# Patient Record
Sex: Female | Born: 1945 | Race: White | Hispanic: No | Smoking: Former smoker
Health system: Southern US, Community
[De-identification: ages and names within clinical notes are randomized; demographics above are authoritative.]

## PROBLEM LIST (undated history)

## (undated) DIAGNOSIS — T7840XA Allergy, unspecified, initial encounter: Secondary | ICD-10-CM

## (undated) DIAGNOSIS — F419 Anxiety disorder, unspecified: Secondary | ICD-10-CM

## (undated) DIAGNOSIS — E038 Other specified hypothyroidism: Secondary | ICD-10-CM

## (undated) DIAGNOSIS — F329 Major depressive disorder, single episode, unspecified: Secondary | ICD-10-CM

## (undated) DIAGNOSIS — G5600 Carpal tunnel syndrome, unspecified upper limb: Secondary | ICD-10-CM

## (undated) DIAGNOSIS — H269 Unspecified cataract: Secondary | ICD-10-CM

## (undated) DIAGNOSIS — M1990 Unspecified osteoarthritis, unspecified site: Secondary | ICD-10-CM

## (undated) DIAGNOSIS — M2040 Other hammer toe(s) (acquired), unspecified foot: Secondary | ICD-10-CM

## (undated) DIAGNOSIS — H8101 Meniere's disease, right ear: Secondary | ICD-10-CM

## (undated) DIAGNOSIS — E079 Disorder of thyroid, unspecified: Secondary | ICD-10-CM

## (undated) DIAGNOSIS — I499 Cardiac arrhythmia, unspecified: Secondary | ICD-10-CM

## (undated) DIAGNOSIS — E063 Autoimmune thyroiditis: Secondary | ICD-10-CM

## (undated) DIAGNOSIS — F32A Depression, unspecified: Secondary | ICD-10-CM

## (undated) HISTORY — DX: Allergy, unspecified, initial encounter: T78.40XA

## (undated) HISTORY — PX: COLONOSCOPY: SHX174

## (undated) HISTORY — PX: CYSTECTOMY: SUR359

## (undated) HISTORY — PX: APPENDECTOMY: SHX54

## (undated) HISTORY — PX: TONSILLECTOMY: SUR1361

## (undated) HISTORY — DX: Other specified hypothyroidism: E03.8

## (undated) HISTORY — DX: Unspecified osteoarthritis, unspecified site: M19.90

## (undated) HISTORY — DX: Autoimmune thyroiditis: E06.3

## (undated) HISTORY — DX: Anxiety disorder, unspecified: F41.9

## (undated) HISTORY — DX: Other hammer toe(s) (acquired), unspecified foot: M20.40

## (undated) HISTORY — DX: Depression, unspecified: F32.A

## (undated) HISTORY — DX: Disorder of thyroid, unspecified: E07.9

## (undated) HISTORY — DX: Unspecified cataract: H26.9

## (undated) HISTORY — DX: Carpal tunnel syndrome, unspecified upper limb: G56.00

## (undated) HISTORY — PX: OOPHORECTOMY: SHX86

## (undated) HISTORY — PX: CATARACT EXTRACTION: SUR2

## (undated) HISTORY — DX: Major depressive disorder, single episode, unspecified: F32.9

## (undated) HISTORY — DX: Meniere's disease, right ear: H81.01

---

## 1998-04-30 ENCOUNTER — Other Ambulatory Visit: Admission: RE | Admit: 1998-04-30 | Discharge: 1998-04-30 | Payer: Self-pay | Admitting: Obstetrics and Gynecology

## 1999-07-11 ENCOUNTER — Encounter: Payer: Self-pay | Admitting: Obstetrics and Gynecology

## 1999-07-11 ENCOUNTER — Ambulatory Visit (HOSPITAL_COMMUNITY): Admission: RE | Admit: 1999-07-11 | Discharge: 1999-07-11 | Payer: Self-pay | Admitting: Obstetrics and Gynecology

## 2000-10-09 ENCOUNTER — Ambulatory Visit (HOSPITAL_COMMUNITY): Admission: RE | Admit: 2000-10-09 | Discharge: 2000-10-09 | Payer: Self-pay | Admitting: *Deleted

## 2001-05-13 ENCOUNTER — Ambulatory Visit (HOSPITAL_COMMUNITY): Admission: RE | Admit: 2001-05-13 | Discharge: 2001-05-13 | Payer: Self-pay | Admitting: *Deleted

## 2002-11-07 ENCOUNTER — Encounter: Admission: RE | Admit: 2002-11-07 | Discharge: 2002-11-07 | Payer: Self-pay | Admitting: Psychiatry

## 2002-11-21 ENCOUNTER — Encounter: Admission: RE | Admit: 2002-11-21 | Discharge: 2002-11-21 | Payer: Self-pay | Admitting: Psychiatry

## 2002-12-06 ENCOUNTER — Encounter: Admission: RE | Admit: 2002-12-06 | Discharge: 2002-12-06 | Payer: Self-pay | Admitting: Psychiatry

## 2003-02-01 ENCOUNTER — Encounter: Admission: RE | Admit: 2003-02-01 | Discharge: 2003-02-01 | Payer: Self-pay | Admitting: Psychiatry

## 2003-02-13 ENCOUNTER — Encounter: Admission: RE | Admit: 2003-02-13 | Discharge: 2003-02-13 | Payer: Self-pay | Admitting: Psychiatry

## 2003-03-06 ENCOUNTER — Encounter: Admission: RE | Admit: 2003-03-06 | Discharge: 2003-03-06 | Payer: Self-pay | Admitting: Psychiatry

## 2003-03-20 ENCOUNTER — Encounter: Admission: RE | Admit: 2003-03-20 | Discharge: 2003-03-20 | Payer: Self-pay | Admitting: Psychiatry

## 2003-03-28 ENCOUNTER — Encounter: Admission: RE | Admit: 2003-03-28 | Discharge: 2003-03-28 | Payer: Self-pay | Admitting: Psychiatry

## 2004-04-05 ENCOUNTER — Encounter: Admission: RE | Admit: 2004-04-05 | Discharge: 2004-04-05 | Payer: Self-pay | Admitting: Psychiatry

## 2007-05-19 LAB — HM COLONOSCOPY

## 2010-05-16 ENCOUNTER — Encounter: Admission: RE | Admit: 2010-05-16 | Discharge: 2010-05-16 | Payer: Self-pay | Admitting: Obstetrics and Gynecology

## 2010-10-20 ENCOUNTER — Encounter: Payer: Self-pay | Admitting: Obstetrics and Gynecology

## 2011-10-03 ENCOUNTER — Ambulatory Visit (INDEPENDENT_AMBULATORY_CARE_PROVIDER_SITE_OTHER): Payer: MEDICARE

## 2011-10-03 DIAGNOSIS — Z711 Person with feared health complaint in whom no diagnosis is made: Secondary | ICD-10-CM

## 2011-10-03 DIAGNOSIS — Z719 Counseling, unspecified: Secondary | ICD-10-CM

## 2011-10-03 DIAGNOSIS — Z0389 Encounter for observation for other suspected diseases and conditions ruled out: Secondary | ICD-10-CM

## 2011-10-07 ENCOUNTER — Ambulatory Visit (INDEPENDENT_AMBULATORY_CARE_PROVIDER_SITE_OTHER): Payer: MEDICARE

## 2011-10-07 DIAGNOSIS — E039 Hypothyroidism, unspecified: Secondary | ICD-10-CM

## 2011-10-07 DIAGNOSIS — F329 Major depressive disorder, single episode, unspecified: Secondary | ICD-10-CM

## 2011-11-02 ENCOUNTER — Ambulatory Visit (INDEPENDENT_AMBULATORY_CARE_PROVIDER_SITE_OTHER): Payer: MEDICARE | Admitting: Family Medicine

## 2011-11-02 VITALS — BP 148/89 | HR 64 | Temp 97.9°F | Resp 18 | Ht 66.0 in | Wt 211.0 lb

## 2011-11-02 DIAGNOSIS — F32A Depression, unspecified: Secondary | ICD-10-CM | POA: Insufficient documentation

## 2011-11-02 DIAGNOSIS — E039 Hypothyroidism, unspecified: Secondary | ICD-10-CM

## 2011-11-02 DIAGNOSIS — R059 Cough, unspecified: Secondary | ICD-10-CM

## 2011-11-02 DIAGNOSIS — R05 Cough: Secondary | ICD-10-CM

## 2011-11-02 DIAGNOSIS — Z9289 Personal history of other medical treatment: Secondary | ICD-10-CM

## 2011-11-02 DIAGNOSIS — J019 Acute sinusitis, unspecified: Secondary | ICD-10-CM

## 2011-11-02 DIAGNOSIS — J209 Acute bronchitis, unspecified: Secondary | ICD-10-CM

## 2011-11-02 DIAGNOSIS — F329 Major depressive disorder, single episode, unspecified: Secondary | ICD-10-CM | POA: Insufficient documentation

## 2011-11-02 MED ORDER — HYDROCODONE-HOMATROPINE 5-1.5 MG/5ML PO SYRP: 5.0000 mL | ORAL_SOLUTION | Freq: Four times a day (QID) | ORAL | Status: AC | PRN

## 2011-11-02 MED ORDER — AZITHROMYCIN 250 MG PO TABS: ORAL_TABLET | ORAL | Status: AC

## 2011-11-02 NOTE — Patient Instructions (Signed)

## 2011-11-02 NOTE — Progress Notes (Signed)
  Subjective:    Patient ID: Elizabeth Lin, female    DOB: 01-Feb-1946, 66 y.o.   MRN: [id number]  Cough This is a new problem. The current episode started in the past 7 days. The problem has been gradually worsening. The problem occurs constantly. The cough is non-productive. Associated symptoms include nasal congestion and postnasal drip. Pertinent negatives include no chest pain, chills, ear congestion, ear pain, fever, headaches, hemoptysis, rash, sore throat or weight loss.      Review of Systems  Constitutional: Negative for fever, chills and weight loss.  HENT: Positive for postnasal drip. Negative for ear pain and sore throat.   Respiratory: Positive for cough. Negative for hemoptysis.   Cardiovascular: Negative for chest pain.  Skin: Negative for rash.  Neurological: Negative for headaches.       Objective:   Physical Exam  Constitutional: She is oriented to person, place, and time. She appears well-developed and well-nourished.  HENT:  Head: Normocephalic and atraumatic.  Right Ear: External ear normal.  Left Ear: External ear normal.  Eyes: Conjunctivae and EOM are normal. Pupils are equal, round, and reactive to light.  Neck: Normal range of motion. Neck supple.  Cardiovascular: Normal rate, regular rhythm, normal heart sounds and intact distal pulses.   Pulmonary/Chest: Effort normal. No respiratory distress. She has rales.  Neurological: She is alert and oriented to person, place, and time.  Skin: Skin is warm and dry.          Assessment & Plan:  Early onset of bronchitis, sinusitis

## 2012-03-16 ENCOUNTER — Ambulatory Visit (INDEPENDENT_AMBULATORY_CARE_PROVIDER_SITE_OTHER): Payer: MEDICARE | Admitting: Physician Assistant

## 2012-03-16 VITALS — BP 133/80 | HR 62 | Temp 98.3°F | Resp 16 | Ht 65.75 in | Wt 206.2 lb

## 2012-03-16 DIAGNOSIS — Z23 Encounter for immunization: Secondary | ICD-10-CM

## 2012-03-16 MED ORDER — TETANUS-DIPHTH-ACELL PERTUSSIS 5-2.5-18.5 LF-MCG/0.5 IM SUSP
0.5000 mL | Freq: Once | INTRAMUSCULAR | Status: AC
  Administered 2012-03-16: 0.5 mL via INTRAMUSCULAR

## 2012-03-16 NOTE — Progress Notes (Signed)
Patient here for TDaP only. Has up coming travel planned. Ok to give TDaP. No provider patient encounter occurred today. Nursing only encounter.  Eula Listen, PA-C 03/16/2012 1:27 PM

## 2012-06-11 ENCOUNTER — Ambulatory Visit (INDEPENDENT_AMBULATORY_CARE_PROVIDER_SITE_OTHER): Payer: MEDICARE | Admitting: Family Medicine

## 2012-06-11 VITALS — BP 123/73 | HR 61 | Temp 98.6°F | Resp 16 | Ht 66.5 in | Wt 205.0 lb

## 2012-06-11 DIAGNOSIS — R5383 Other fatigue: Secondary | ICD-10-CM

## 2012-06-11 DIAGNOSIS — R5381 Other malaise: Secondary | ICD-10-CM

## 2012-06-11 DIAGNOSIS — J069 Acute upper respiratory infection, unspecified: Secondary | ICD-10-CM

## 2012-06-11 LAB — POCT CBC
Lymph, poc: 1.8 (ref 0.6–3.4)
MCH, POC: 27.3 pg (ref 27–31.2)
MCHC: 30.6 g/dL — AB (ref 31.8–35.4)
MCV: 89.4 fL (ref 80–97)
MID (cbc): 0.4 (ref 0–0.9)
MPV: 8.5 fL (ref 0–99.8)
POC LYMPH PERCENT: 28.1 %L (ref 10–50)
POC MID %: 5.7 %M (ref 0–12)
Platelet Count, POC: 345 10*3/uL (ref 142–424)
RBC: 5.01 M/uL (ref 4.04–5.48)
WBC: 6.5 10*3/uL (ref 4.6–10.2)

## 2012-06-11 MED ORDER — AZELASTINE HCL 0.1 % NA SOLN: 1.0000 | Freq: Two times a day (BID) | NASAL | Status: DC

## 2012-06-11 NOTE — Progress Notes (Signed)
Subjective:    Results for orders placed in visit on 06/11/12  POCT CBC      Component Value Range   WBC 6.5  4.6 - 10.2 K/uL   Lymph, poc 1.8  0.6 - 3.4   POC LYMPH PERCENT 28.1  10 - 50 %L   MID (cbc) 0.4  0 - 0.9   POC MID % 5.7  0 - 12 %M   POC Granulocyte 4.3  2 - 6.9   Granulocyte percent 66.2  37 - 80 %G   RBC 5.01  4.04 - 5.48 M/uL   Hemoglobin 13.7  12.2 - 16.2 g/dL   HCT, POC 16.1  09.6 - 47.9 %   MCV 89.4  80 - 97 fL   MCH, POC 27.3  27 - 31.2 pg   MCHC 30.6 (*) 31.8 - 35.4 g/dL   RDW, POC 04.5     Platelet Count, POC 345  142 - 424 K/uL   MPV 8.5  0 - 99.8 fL

## 2012-06-11 NOTE — Patient Instructions (Addendum)
Drink plenty of fluids  Use nose spray  Return if worse

## 2012-06-14 ENCOUNTER — Ambulatory Visit (INDEPENDENT_AMBULATORY_CARE_PROVIDER_SITE_OTHER): Payer: MEDICARE | Admitting: Family Medicine

## 2012-06-14 VITALS — BP 172/86 | HR 59 | Temp 97.9°F | Resp 18 | Ht 65.5 in | Wt 206.2 lb

## 2012-06-14 DIAGNOSIS — J069 Acute upper respiratory infection, unspecified: Secondary | ICD-10-CM

## 2012-06-14 DIAGNOSIS — J45909 Unspecified asthma, uncomplicated: Secondary | ICD-10-CM

## 2012-06-14 MED ORDER — AZITHROMYCIN 250 MG PO TABS: ORAL_TABLET | ORAL | Status: DC

## 2012-06-14 MED ORDER — ALBUTEROL SULFATE HFA 108 (90 BASE) MCG/ACT IN AERS: 2.0000 | INHALATION_SPRAY | Freq: Four times a day (QID) | RESPIRATORY_TRACT | Status: DC | PRN

## 2012-06-14 MED ORDER — HYDROCODONE-HOMATROPINE 5-1.5 MG/5ML PO SYRP: 5.0000 mL | ORAL_SOLUTION | Freq: Three times a day (TID) | ORAL | Status: DC | PRN

## 2012-06-14 NOTE — Progress Notes (Signed)
Subjective: Patient was seen last week with respiratory congestion. Her blood count was normal. It was felt that she had a allergic or viral upper respiratory infection and she was treated with the nose spray. She is continued to feel bad, and his gotten worse this weekend. She is coughing and has a hard time laying down without coughing. She is not running a fever. She has little itching in her ears. The nose and sinuses have pressure in them. She does not smoke  Objective: Her TMs are normal has a little wax in both ear canals. Throat clear. Neck supple without nodes. Chest has some mild scattered wheezing on the left. Heart regular without murmurs.  Assessment: URI with secondary asthmatic bronchitis  Plan: Fluids Zithromax Albuterol and cough syrup

## 2012-06-14 NOTE — Patient Instructions (Signed)
Fluids Return if worse Use albuterol if needed for wheezing or shortness of breath

## 2012-10-26 ENCOUNTER — Other Ambulatory Visit: Payer: Self-pay | Admitting: Family Medicine

## 2012-10-26 NOTE — Telephone Encounter (Signed)
Please pull paper chart.  

## 2012-10-27 NOTE — Telephone Encounter (Signed)
Chart pulled to PA pool at nurses station [phone]

## 2012-10-27 NOTE — Telephone Encounter (Signed)
Chart already pulled [id number]

## 2012-10-27 NOTE — Telephone Encounter (Signed)
Please pull paper chart. No TSH in epic.

## 2012-11-05 ENCOUNTER — Ambulatory Visit: Payer: MEDICARE

## 2012-11-05 ENCOUNTER — Ambulatory Visit (INDEPENDENT_AMBULATORY_CARE_PROVIDER_SITE_OTHER): Payer: 59 | Admitting: Emergency Medicine

## 2012-11-05 VITALS — BP 160/90 | HR 63 | Temp 98.0°F | Resp 17 | Ht 65.5 in | Wt 206.0 lb

## 2012-11-05 DIAGNOSIS — F329 Major depressive disorder, single episode, unspecified: Secondary | ICD-10-CM

## 2012-11-05 DIAGNOSIS — Z0184 Encounter for antibody response examination: Secondary | ICD-10-CM

## 2012-11-05 DIAGNOSIS — Z0289 Encounter for other administrative examinations: Secondary | ICD-10-CM

## 2012-11-05 DIAGNOSIS — E079 Disorder of thyroid, unspecified: Secondary | ICD-10-CM

## 2012-11-05 DIAGNOSIS — Z23 Encounter for immunization: Secondary | ICD-10-CM

## 2012-11-05 DIAGNOSIS — Z021 Encounter for pre-employment examination: Secondary | ICD-10-CM

## 2012-11-05 DIAGNOSIS — E039 Hypothyroidism, unspecified: Secondary | ICD-10-CM

## 2012-11-05 DIAGNOSIS — R7611 Nonspecific reaction to tuberculin skin test without active tuberculosis: Secondary | ICD-10-CM

## 2012-11-05 DIAGNOSIS — I10 Essential (primary) hypertension: Secondary | ICD-10-CM

## 2012-11-05 LAB — COMPREHENSIVE METABOLIC PANEL
AST: 30 U/L (ref 0–37)
Alkaline Phosphatase: 90 U/L (ref 39–117)
BUN: 21 mg/dL (ref 6–23)
Creat: 0.69 mg/dL (ref 0.50–1.10)
Potassium: 4.3 mEq/L (ref 3.5–5.3)
Total Bilirubin: 0.6 mg/dL (ref 0.3–1.2)

## 2012-11-05 LAB — POCT CBC
Granulocyte percent: 61.5 %G (ref 37–80)
Lymph, poc: 2.2 (ref 0.6–3.4)
MCH, POC: 28.4 pg (ref 27–31.2)
MCHC: 32.2 g/dL (ref 31.8–35.4)
MCV: 88.3 fL (ref 80–97)
MID (cbc): 0.4 (ref 0–0.9)
POC LYMPH PERCENT: 31.9 %L (ref 10–50)
Platelet Count, POC: 329 10*3/uL (ref 142–424)
RDW, POC: 15.1 %

## 2012-11-05 LAB — T3 UPTAKE: T3 Uptake: 34.8 % (ref 22.5–37.0)

## 2012-11-05 LAB — TSH: TSH: 1.745 u[IU]/mL (ref 0.350–4.500)

## 2012-11-05 MED ORDER — DULOXETINE HCL 20 MG PO CPEP: 20.0000 mg | ORAL_CAPSULE | Freq: Every day | ORAL | Status: DC

## 2012-11-05 MED ORDER — LEVOTHYROXINE SODIUM 150 MCG PO TABS: 150.0000 ug | ORAL_TABLET | Freq: Every day | ORAL | Status: DC

## 2012-11-05 NOTE — Progress Notes (Signed)
  Subjective:    Patient ID: Elizabeth Lin, female    DOB: 1946-02-16, 67 y.o.   MRN: [id number]  HPI Patient comes in today to have a chest xray. 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 She did not have an active episode of TB. She was exposed to TB in [place]. She also needs a refill on her medication.    Review of Systems     Objective:   Physical Exam HEENT exam is unremarkable. Her neck is supple. Chest is clear to auscultation and percussion. Heart regular rate without murmurs rubs or gallops. Abdomen is soft nontender. There is a questionable 1 x 2 cm nodule right lobe of the thyroid.   [facility] reading (PRIMARY) by  Dr. Cleta Alberts there is previous granulomatous disease. There is elevation of the right hemidiaphragm.      Assessment & Plan:  Routine labs were done to screen for immunizations. I did a varicella  titer and MMR titer. Thyroid studies were also done meds were refilled and ultrasound of the thyroid was ordered. Drug Screen was also done.

## 2012-11-08 LAB — MEASLES/MUMPS/RUBELLA IMMUNITY
Mumps IgG: >300 AU/mL — ABNORMAL HIGH (ref <9.00)
Rubeola IgG: >300 AU/mL — ABNORMAL HIGH (ref <25.00)

## 2012-11-08 LAB — VARICELLA ZOSTER ANTIBODY, IGG: Varicella IgG: 2340 Index — ABNORMAL HIGH (ref <135.00)

## 2012-11-09 ENCOUNTER — Telehealth: Payer: Self-pay

## 2012-11-09 ENCOUNTER — Telehealth: Payer: Self-pay | Admitting: Radiology

## 2012-11-09 NOTE — Telephone Encounter (Signed)
She was given a letter that states xray normal no signs of active tuberculosis. I called her to advise. She can pick up the letter.

## 2012-11-09 NOTE — Telephone Encounter (Signed)
Letter given for patient.

## 2012-11-09 NOTE — Telephone Encounter (Signed)
Pt would like someone to call regarding her x-rays  CBN:  [phone]

## 2012-11-12 ENCOUNTER — Other Ambulatory Visit: Payer: Self-pay

## 2012-11-15 ENCOUNTER — Ambulatory Visit
Admission: RE | Admit: 2012-11-15 | Discharge: 2012-11-15 | Disposition: A | Discharge status: Home / Self Care | Payer: Medicare Other | Source: Ambulatory Visit | Attending: Emergency Medicine | Admitting: Emergency Medicine

## 2012-11-15 DIAGNOSIS — E079 Disorder of thyroid, unspecified: Secondary | ICD-10-CM

## 2012-11-22 ENCOUNTER — Ambulatory Visit: Payer: 59

## 2012-11-22 ENCOUNTER — Ambulatory Visit (INDEPENDENT_AMBULATORY_CARE_PROVIDER_SITE_OTHER): Payer: 59 | Admitting: Family Medicine

## 2012-11-22 VITALS — BP 144/79 | HR 62 | Temp 97.9°F | Resp 18 | Ht 66.0 in | Wt 205.0 lb

## 2012-11-22 DIAGNOSIS — M25569 Pain in unspecified knee: Secondary | ICD-10-CM

## 2012-11-22 NOTE — Patient Instructions (Addendum)
Call your orthopedist at [facility] orthopedics and ask for an appointment to evaluate your knee.  In the meantime you can certainly try some tylenol, and continue to use your knee sleeve as needed.    If you start to get worse or need any other help in the meantime please let us know!

## 2012-11-22 NOTE — Progress Notes (Signed)
[facility] [address] [place] [address] [phone]  Date:  11/22/2012   Name:  Elizabeth Lin   DOB:  1945-10-19   MRN:  [id number]  PCP:  No primary provider on file.    Chief Complaint: Knee Pain   History of Present Illness:  Elizabeth Lin is a 67 y.o. very pleasant female patient who presents with the following:  She has noted right knee problems for about one month.  She cannot flex it fully without pain.  She is using a knee sleeve on and off which does help, but not taking any medications.   The knee does crack and pop sometimes.  The pain may be worse in the lateral knee.  It does not feel unstable or give way, but she does feel uneasy on the knee.   No particular injury to her knee.   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    Patient Active Problem List  Diagnosis  . Hypothyroid  . Depression  . History of positive PPD    Past Medical History  Diagnosis Date  . Allergy   . Arthritis   . Depression   . Thyroid disease   . Anxiety     Past Surgical History  Procedure Laterality Date  . Appendectomy      History  Substance Use Topics  . Smoking status: Former Smoker    Types: Cigarettes    Quit date: 11/01/1974  . Smokeless tobacco: Former Neurosurgeon    Quit date: 06/11/1982  . Alcohol Use: No    Family History  Problem Relation Age of Onset  . Diabetes Father   . Thyroid disease Mother   . Hypertension Brother   . Thyroid disease Daughter   . Depression Son     Allergies  Allergen Reactions  . Amoxicillin-Pot Clavulanate     Red rash    Medication list has been reviewed and updated.  Current Outpatient Prescriptions on File Prior to Visit  Medication Sig Dispense Refill  . azelastine (ASTELIN) 137 MCG/SPRAY nasal spray Place 1 spray into the  nose 2 (two) times daily. Use in each nostril as directed  30 mL  12  . Calcium-Vitamin D-Vitamin K (CALCIUM + D + K PO) Take by mouth.      . DULoxetine (CYMBALTA) 20 MG capsule Take 1 capsule (20 mg total) by mouth daily. Needs office visit/labs  30 capsule  11  . fish oil-omega-3 fatty acids 1000 MG capsule Take 2 g by mouth daily.      Marland Kitchen levothyroxine (SYNTHROID, LEVOTHROID) 150 MCG tablet Take 1 tablet (150 mcg total) by mouth daily. Needs office visit/labs  30 tablet  11  . Multiple Vitamins-Minerals (MULTIVITAMIN WITH MINERALS) tablet Take 1 tablet by mouth daily.      Marland Kitchen azithromycin (ZITHROMAX) 250 MG tablet Take 2 initiallly, then one daily for 4 days  6 tablet  0  . calcium & magnesium carbonates (MYLANTA) 784-696 MG per tablet Take 1 tablet by mouth daily.      Marland Kitchen HYDROcodone-homatropine (HYCODAN) 5-1.5 MG/5ML syrup Take 5 mLs by mouth every 8 (eight) hours as needed for cough.  120 mL  0   No current facility-administered medications on file prior to visit.    Review of Systems:  As per HPI- otherwise negative.   Physical Examination: Filed Vitals:   11/22/12 1410  BP: 144/79  Pulse: 62  Temp: 97.9 F (36.6 C)  Resp: 18   Filed Vitals:   11/22/12 1410  Height: 5\' 6"  (1.676 m)  Weight: 205 lb (92.987 kg)   Body mass index is 33.1 kg/(m^2). Ideal Body Weight: Weight in (lb) to have BMI = 25: 154.6  GEN: WDWN, NAD, Non-toxic, A & O x 3, obese HEENT: Atraumatic, Normocephalic. Neck supple. No masses, No LAD. Ears and Nose: No external deformity. CV: RRR, No M/G/R. No JVD. No thrill. No extra heart sounds. PULM: CTA B, no wheezes, crackles, rhonchi. No retractions. No resp. distress. No accessory muscle use. EXTR: No c/c/e NEURO Normal gait.  PSYCH: Normally interactive. Conversant. Not depressed or anxious appearing.  Calm demeanor.  Right knee; minimal crepitus with flexion, ligaments stable, no laxity.  Minimal lateral joint line pain, no effusion noted, no  redness/ heat/ swelling.  Normal strength of hamstrings and quads.    [facility] reading (PRIMARY) by  Dr. Patsy Lager. Right knee: very large spur off of tibia, degenerative change *RADIOLOGY REPORT*  Clinical Data: Right-sided knee pain, no known injury  RIGHT KNEE - COMPLETE 4+ VIEW  Comparison: None.  Findings: The AP radiograph is limited due to obliquity. No definite fracture or dislocation. Mild tricompartmental degenerative change is suspected, likely worse within the medial compartment with joint space loss, subchondral sclerosis and minimal amount of osteophytosis. There is minimal spurring of the tibial spines. No evidence of chondrocalcinosis. There is a minimal amount of enthesopathic change of the superior pole of the patella. Query small suprapatellar joint effusion. Regional soft tissues are normal.  IMPRESSION: 1. No acute findings. 2. Mild degenerative change of the knee, likely worse within the medial compartment.  Assessment and Plan: Pain, knee, right - Plan: DG Knee Complete 4 Views Right  Degenerative change of the right knee with mild symptoms.  She is established at [facility].   See patient instructions for more details.    Called with OR report- radiologist felt her tibial spurring was more minimal.  This does not change our plan, but might influence her to try symptomatic therapy first prior to going to ortho.  Call if any questions or concerns    COPLAND,JESSICA, MD

## 2012-12-12 ENCOUNTER — Ambulatory Visit (INDEPENDENT_AMBULATORY_CARE_PROVIDER_SITE_OTHER): Payer: 59 | Admitting: Family Medicine

## 2012-12-12 VITALS — BP 140/84 | HR 86 | Temp 97.9°F | Resp 16 | Ht 67.0 in | Wt 207.0 lb

## 2012-12-12 DIAGNOSIS — K140 Glossitis: Secondary | ICD-10-CM

## 2012-12-12 DIAGNOSIS — J040 Acute laryngitis: Secondary | ICD-10-CM

## 2012-12-12 DIAGNOSIS — J069 Acute upper respiratory infection, unspecified: Secondary | ICD-10-CM

## 2012-12-12 MED ORDER — AZITHROMYCIN 250 MG PO TABS: ORAL_TABLET | ORAL | Status: DC

## 2012-12-12 NOTE — Progress Notes (Signed)
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  No fever  Objective:  Reddened right side of tongue, hoarse Posterior pharynx normal TM's normal Neck supple without adenopathy  Assessment:  URI  Plan: URI, acute - Plan: azithromycin (ZITHROMAX) 250 MG tablet  Laryngitis - Plan: azithromycin (ZITHROMAX) 250 MG tablet  Glossitis - Plan: azithromycin (ZITHROMAX) 250 MG tablet

## 2013-02-24 ENCOUNTER — Ambulatory Visit (INDEPENDENT_AMBULATORY_CARE_PROVIDER_SITE_OTHER): Payer: 59 | Admitting: Physician Assistant

## 2013-02-24 VITALS — BP 155/84 | HR 70 | Temp 98.1°F | Resp 18 | Ht 66.0 in | Wt 206.0 lb

## 2013-02-24 DIAGNOSIS — J069 Acute upper respiratory infection, unspecified: Secondary | ICD-10-CM

## 2013-02-24 DIAGNOSIS — M25569 Pain in unspecified knee: Secondary | ICD-10-CM

## 2013-02-24 DIAGNOSIS — J029 Acute pharyngitis, unspecified: Secondary | ICD-10-CM

## 2013-02-24 DIAGNOSIS — M25561 Pain in right knee: Secondary | ICD-10-CM | POA: Insufficient documentation

## 2013-02-24 LAB — POCT RAPID STREP A (OFFICE): Rapid Strep A Screen: NEGATIVE

## 2013-02-24 MED ORDER — GUAIFENESIN ER 1200 MG PO TB12: 1.0000 | ORAL_TABLET | Freq: Two times a day (BID) | ORAL | Status: AC

## 2013-02-24 NOTE — Patient Instructions (Addendum)
Add an Advil (motrin or ibuprofen) to each dose of the Advil allergy. Push fluids.

## 2013-02-24 NOTE — Progress Notes (Signed)
   [address] [place] [address]   Phone [phone]  Subjective:    Patient ID: Elizabeth Lin, female    DOB: May 10, 1946, 67 y.o.   MRN: [id number]  HPI Pt presents to clinic with 3 day h/o cold symptoms with sore throat that is not getting better.  She is starting to get more congestion and a cough today.  Yesterday she started taking OTC meds and she is not sure if it is helping.  She has not been around any sick contacts.  She went to the ortho and they aspirated her R knee and it is feeling better but not completely resolved.  She was told to recheck with him if not resolved for a possible CT scan but she is unsure what she should do.    OTC meds - Advil allergy  Review of Systems  Constitutional: Negative for fever and chills.  HENT: Positive for congestion, sore throat, rhinorrhea (white) and postnasal drip.   Respiratory: Positive for cough.   Allergic/Immunologic: Positive for environmental allergies.       Objective:   Physical Exam  Vitals reviewed. Constitutional: She is oriented to person, place, and time. She appears well-developed and well-nourished.  HENT:  Head: Normocephalic and atraumatic.  Right Ear: Hearing, tympanic membrane, external ear and ear canal normal.  Left Ear: Hearing, tympanic membrane, external ear and ear canal normal.  Nose: Nose normal.  Mouth/Throat: Uvula is midline, oropharynx is clear and moist and mucous membranes are normal.  Eyes: Conjunctivae are normal.  Cardiovascular: Normal rate, regular rhythm and normal heart sounds.   No murmur heard. Pulmonary/Chest: Effort normal and breath sounds normal.  Neurological: She is alert and oriented to person, place, and time.  Skin: Skin is warm and dry.  Psychiatric: She has a normal mood and affect. Her behavior is normal. Judgment and thought content normal.       Assessment & Plan:  Acute pharyngitis - Plan: POCT rapid strep A  Right knee pain - d/w pt at length how she can  decided her next step and how much she wants to know what is going on and if she is interested in surgical intervention.  She should be doing her home PT to help with the quad strength to help improve the pain in her knee.  Viral URI with cough - Plan: Guaifenesin (MUCINEX MAXIMUM STRENGTH) 1200 MG TB12 - push fluids - can add Tylenol and Advil for the throat pain.  F/u if not better in 7 days.  Benny Lennert PA-C 02/24/2013 8:38 PM

## 2013-03-29 ENCOUNTER — Ambulatory Visit: Payer: 59

## 2013-03-29 ENCOUNTER — Ambulatory Visit (INDEPENDENT_AMBULATORY_CARE_PROVIDER_SITE_OTHER): Payer: 59 | Admitting: Family Medicine

## 2013-03-29 VITALS — BP 151/84 | HR 55 | Temp 97.8°F | Resp 16 | Ht 66.0 in | Wt 202.0 lb

## 2013-03-29 DIAGNOSIS — M542 Cervicalgia: Secondary | ICD-10-CM

## 2013-03-29 DIAGNOSIS — M25519 Pain in unspecified shoulder: Secondary | ICD-10-CM

## 2013-03-29 MED ORDER — TRAMADOL HCL 50 MG PO TABS: 50.0000 mg | ORAL_TABLET | Freq: Three times a day (TID) | ORAL | Status: DC | PRN

## 2013-03-29 MED ORDER — PREDNISONE 20 MG PO TABS: ORAL_TABLET | ORAL | Status: DC

## 2013-03-29 NOTE — Progress Notes (Signed)
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  States pain begins in her left trapezius area goes around to right side and down her right upper arm. Had incident when she went to lie down on the couch, this may have triggered current pain.  Thinks something positional or with her furniture may be increasing her pain. Has increased pain with range of motion to right. No perceived weakness or numbness in her right arm. The pain radiates down into the triceps from the right trapezius area.  Objective:  NAD  Patient has no weakness of upper extremities on exam, however, grip strength decreased slightly on the right. Patient right hand dominant. Reflexes normal. Patient does indicate she has had previous trouble with her neck, thinks she has diagnosis of stenosis.   Patient has some tenderness at the base of the cervical spine in the right paraspinal region of C7. Range of motion neck is normal.  [facility] reading (PRIMARY) by  Dr. Milus Glazier:  Normal C-spines  Assessment:  Acute torticollis  Plan: Ultram and prednisone.  Cervical pain - Plan: DG Cervical Spine Complete, traMADol (ULTRAM) 50 MG tablet, predniSONE (DELTASONE) 20 MG tablet  Signed, Elvina Sidle, MD

## 2013-03-29 NOTE — Patient Instructions (Addendum)
Torticollis, Acute °You have suddenly (acutely) developed a twisted neck (torticollis). This is usually a self-limited condition. °CAUSES  °Acute torticollis may be caused by malposition, trauma or infection. Most commonly, acute torticollis is caused by sleeping in an awkward position. Torticollis may also be caused by the flexion, extension or twisting of the neck muscles beyond their normal position. Sometimes, the exact cause may not be known. °SYMPTOMS  °Usually, there is pain and limited movement of the neck. Your neck may twist to one side. °DIAGNOSIS  °The diagnosis is often made by physical examination. X-rays, CT scans or MRIs may be done if there is a history of trauma or concern of infection. °TREATMENT  °For a common, stiff neck that develops during sleep, treatment is focused on relaxing the contracted neck muscle. Medications (including shots) may be used to treat the problem. Most cases resolve in several days. Torticollis usually responds to conservative physical therapy. If left untreated, the shortened and spastic neck muscle can cause deformities in the face and neck. Rarely, surgery is required. °HOME CARE INSTRUCTIONS  °· Use over-the-counter and prescription medications as directed by your caregiver. °· Do stretching exercises and massage the neck as directed by your caregiver. °· Follow up with physical therapy if needed and as directed by your caregiver. °SEEK IMMEDIATE MEDICAL CARE IF:  °· You develop difficulty breathing or noisy breathing (stridor). °· You drool, develop trouble swallowing or have pain with swallowing. °· You develop numbness or weakness in the hands or feet. °· You have changes in speech or vision. °· You have problems with urination or bowel movements. °· You have difficulty walking. °· You have a fever. °· You have increased pain. °MAKE SURE YOU:  °· Understand these instructions. °· Will watch your condition. °· Will get help right away if you are not doing well or  get worse. °Document Released: 09/12/2000 Document Revised: 12/08/2011 Document Reviewed: 10/24/2009 °ExitCare® Patient Information ©2014 [employer]. ° °

## 2013-05-20 ENCOUNTER — Encounter: Payer: MEDICARE | Admitting: Family Medicine

## 2013-06-03 ENCOUNTER — Ambulatory Visit (INDEPENDENT_AMBULATORY_CARE_PROVIDER_SITE_OTHER): Payer: 59 | Admitting: Family Medicine

## 2013-06-03 ENCOUNTER — Encounter: Payer: Self-pay | Admitting: Family Medicine

## 2013-06-03 VITALS — BP 146/92 | HR 62 | Temp 98.0°F | Resp 16 | Ht 65.75 in | Wt 203.0 lb

## 2013-06-03 DIAGNOSIS — R030 Elevated blood-pressure reading, without diagnosis of hypertension: Secondary | ICD-10-CM

## 2013-06-03 DIAGNOSIS — R42 Dizziness and giddiness: Secondary | ICD-10-CM

## 2013-06-03 DIAGNOSIS — Z1239 Encounter for other screening for malignant neoplasm of breast: Secondary | ICD-10-CM

## 2013-06-03 DIAGNOSIS — E039 Hypothyroidism, unspecified: Secondary | ICD-10-CM

## 2013-06-03 LAB — COMPREHENSIVE METABOLIC PANEL
ALT: 16 U/L (ref 0–35)
CO2: 28 mEq/L (ref 19–32)
Calcium: 9.5 mg/dL (ref 8.4–10.5)
Chloride: 105 mEq/L (ref 96–112)
Creat: 0.7 mg/dL (ref 0.50–1.10)
Glucose, Bld: 82 mg/dL (ref 70–99)
Sodium: 138 mEq/L (ref 135–145)
Total Protein: 6.8 g/dL (ref 6.0–8.3)

## 2013-06-03 NOTE — Progress Notes (Signed)
Subjective:    Patient ID: Elizabeth Lin, female    DOB: 09-24-1946, 67 y.o.   MRN: [id number] Chief Complaint  Patient presents with  . Establish Primary Care  . Dizziness    HPI  For about the past year she will get dizzy if she tilts her head back, is instantaneous with tilting head back or looking to the sides, and when she leans her head forward is fine. Sometimes if she gets up to quickly from sitting to standing will having dizziness.  No presyncope, feels lightheaded and dysequilibrium but no vertigo.  Gets HAs occasionally - often connected with stress and humidity, relieved with ibuprofen. Does have problems with seasonal allergies - uses otc allergies medications both pills and nasal sprays intermittently.  Fluticasone nasal spray for rhinitis from ENT she uses continuously as has intermittent hearing loss in her right ear and this helps a lot. ENT visit was about a year ago.  Often falls asleep on couch and neck bounces back. Also was diagnosed with some c-spine arthritis previously.  Has had some borderline cholesterol checked here in the past. Not fasting today. HDL was always very good  Also, c/o weight gain. Chronic lower ext edema. Vision changes - needs to make appt with eye doctor.  Notices that her memory is getting worse. Was diagnosed with some memory issues 3-4 yrs ago in [place] through neurologic and psychologist testing.  Sounds like more ADD-type sxs rather than age or disease-related memory loss but she has the reports at home as well as prior brain imaging which she will bring to her f/u appt.  Last pap was from Dr. Rana Snare 2 yrs prev - was normal, no h/o abnml. Did have 1 ovary removed due to cyst during ex-lap.  Had colonoscopy in [place] in 2005. Last mammogram was 2012 at Dr. Vance Gather office. No postmenopausal bleeding.  Past Medical History  Diagnosis Date  . Allergy   . Arthritis   . Depression   . Thyroid disease   . Anxiety    Current  Outpatient Prescriptions on File Prior to Visit  Medication Sig Dispense Refill  . Calcium-Vitamin D-Vitamin K (CALCIUM + D + K PO) Take by mouth.      . DULoxetine (CYMBALTA) 20 MG capsule Take 1 capsule (20 mg total) by mouth daily. Needs office visit/labs  30 capsule  11  . fish oil-omega-3 fatty acids 1000 MG capsule Take 2 g by mouth daily.      . fluticasone (FLONASE) 50 MCG/ACT nasal spray Place 2 sprays into the nose as needed.       Marland Kitchen levothyroxine (SYNTHROID, LEVOTHROID) 150 MCG tablet Take 1 tablet (150 mcg total) by mouth daily. Needs office visit/labs  30 tablet  11  . Multiple Vitamins-Minerals (MULTIVITAMIN WITH MINERALS) tablet Take 1 tablet by mouth daily.      . calcium & magnesium carbonates (MYLANTA) 311-232 MG per tablet Take 1 tablet by mouth as needed.       . predniSONE (DELTASONE) 20 MG tablet 2 daily with food  10 tablet  1  . traMADol (ULTRAM) 50 MG tablet Take 1 tablet (50 mg total) by mouth every 8 (eight) hours as needed for pain.  30 tablet  0   No current facility-administered medications on file prior to visit.     Review of Systems  Constitutional: Positive for fatigue and unexpected weight change. Negative for fever, chills, diaphoresis and appetite change.  HENT: Positive for hearing loss.   Eyes:  Positive for visual disturbance.  Respiratory: Negative for cough and shortness of breath.   Cardiovascular: Positive for leg swelling. Negative for chest pain and palpitations.  Genitourinary: Negative for decreased urine volume.  Neurological: Positive for dizziness, light-headedness and headaches. Negative for syncope.  Hematological: Does not bruise/bleed easily.  Psychiatric/Behavioral: Positive for dysphoric mood.      BP 146/86  Pulse 60  Temp(Src) 98 F (36.7 C) (Oral)  Resp 16  Ht 5' 5.75" (1.67 m)  Wt 203 lb (92.08 kg)  BMI 33.02 kg/m2  SpO2 98% Objective:   Physical Exam  Constitutional: She is oriented to person, place, and time. She  appears well-developed and well-nourished. No distress.  HENT:  Head: Normocephalic and atraumatic.  Right Ear: Tympanic membrane, external ear and ear canal normal.  Left Ear: Tympanic membrane, external ear and ear canal normal.  Nose: Nose normal. No mucosal edema or rhinorrhea.  Mouth/Throat: Uvula is midline, oropharynx is clear and moist and mucous membranes are normal. No oropharyngeal exudate.  Eyes: Conjunctivae are normal. Right eye exhibits no discharge. Left eye exhibits no discharge. No scleral icterus.  Neck: Normal range of motion. Neck supple. Normal carotid pulses present. No spinous process tenderness and no muscular tenderness present. Carotid bruit is not present. No erythema and normal range of motion present. No mass and no thyromegaly present.  Cardiovascular: Normal rate, regular rhythm, normal heart sounds and intact distal pulses.   2+ nonpitting edema bilaterally  Pulmonary/Chest: Effort normal and breath sounds normal.  Musculoskeletal:       Cervical back: Normal. She exhibits normal range of motion, no tenderness, no bony tenderness, no pain, no spasm and normal pulse.  Lymphadenopathy:    She has no cervical adenopathy.  Neurological: She is alert and oriented to person, place, and time.  Skin: Skin is warm and dry. She is not diaphoretic. No erythema.  Psychiatric: She has a normal mood and affect. Her behavior is normal.     [facility] reading (PRIMARY) by  Dr. Clelia Croft. EKG: sinus bradycardia. No arrythemia or ischemic changes Negative orthostatic VS. Assessment & Plan:  Elevated blood pressure - Plan: Sedimentation Rate, TSH, CBC with Differential, Comprehensive metabolic panel, EKG 12-Lead - [name] is going to purchase a home BP cuff and check daily - she will call with readings to determine if she is having white coat HTN vs actual HTN - her BP in the past 2 yrs has varied from 120-170 systolic but averages 140s-150s. Consider starting pt on diuretic due to  chronic lower ext edema.  Dizziness and giddiness - Plan: Sedimentation Rate, EKG 12-Lead, Ambulatory referral to Neurology - I am concerned that this could be a symptoms of posterior circulatory dysfunction considering her vision changes, HAs, and memory complaints as well as lightheadedness whenever she tips head back and wonder if she needs CT angiography of her neck/brain but will defer to neuro - sounds like pt does have eustachian tube dysfunction which could be cause but less likely since no fluid behind TM on exam today and pt is symptomatic today.  Screening breast examination - Plan: MM Digital Screening - sched CPE in 1 mo at f/u - fasting and breast exam then. Consider referral for colonoscopy as well as likely due around 2015.

## 2013-06-03 NOTE — Patient Instructions (Addendum)
At your next visit lets do a full physical so please be fasting.  Hypertension As your heart beats, it forces blood through your arteries. This force is your blood pressure. If the pressure is too high, it is called hypertension (HTN) or high blood pressure. HTN is dangerous because you may have it and not know it. High blood pressure may mean that your heart has to work harder to pump blood. Your arteries may be narrow or stiff. The extra work puts you at risk for heart disease, stroke, and other problems.  Blood pressure consists of two numbers, a higher number over a lower, 110/72, for example. It is stated as "110 over 72." The ideal is below 120 for the top number (systolic) and under 80 for the bottom (diastolic). Write down your blood pressure today. You should pay close attention to your blood pressure if you have certain conditions such as:  Heart failure.  Prior heart attack.  Diabetes  Chronic kidney disease.  Prior stroke.  Multiple risk factors for heart disease. To see if you have HTN, your blood pressure should be measured while you are seated with your arm held at the level of the heart. It should be measured at least twice. A one-time elevated blood pressure reading (especially in the Emergency Department) does not mean that you need treatment. There may be conditions in which the blood pressure is different between your right and left arms. It is important to see your caregiver soon for a recheck. Most people have essential hypertension which means that there is not a specific cause. This type of high blood pressure may be lowered by changing lifestyle factors such as:  Stress.  Smoking.  Lack of exercise.  Excessive weight.  Drug/tobacco/alcohol use.  Eating less salt. Most people do not have symptoms from high blood pressure until it has caused damage to the body. Effective treatment can often prevent, delay or reduce that damage. TREATMENT  When a cause has been  identified, treatment for high blood pressure is directed at the cause. There are a large number of medications to treat HTN. These fall into several categories, and your caregiver will help you select the medicines that are best for you. Medications may have side effects. You should review side effects with your caregiver. If your blood pressure stays high after you have made lifestyle changes or started on medicines,   Your medication(s) may need to be changed.  Other problems may need to be addressed.  Be certain you understand your prescriptions, and know how and when to take your medicine.  Be sure to follow up with your caregiver within the time frame advised (usually within two weeks) to have your blood pressure rechecked and to review your medications.  If you are taking more than one medicine to lower your blood pressure, make sure you know how and at what times they should be taken. Taking two medicines at the same time can result in blood pressure that is too low. SEEK IMMEDIATE MEDICAL CARE IF:  You develop a severe headache, blurred or changing vision, or confusion.  You have unusual weakness or numbness, or a faint feeling.  You have severe chest or abdominal pain, vomiting, or breathing problems. MAKE SURE YOU:   Understand these instructions.  Will watch your condition.  Will get help right away if you are not doing well or get worse. Document Released: 09/15/2005 Document Revised: 12/08/2011 Document Reviewed: 05/05/2008 [facility] Patient Information 2014 [place], [place]. DASH Diet  The DASH diet stands for "Dietary Approaches to Stop Hypertension." It is a healthy eating plan that has been shown to reduce high blood pressure (hypertension) in as little as 14 days, while also possibly providing other significant health benefits. These other health benefits include reducing the risk of breast cancer after menopause and reducing the risk of type 2 diabetes, heart disease,  colon cancer, and stroke. Health benefits also include weight loss and slowing kidney failure in patients with chronic kidney disease.  DIET GUIDELINES  Limit salt (sodium). Your diet should contain less than 1500 mg of sodium daily.  Limit refined or processed carbohydrates. Your diet should include mostly whole grains. Desserts and added sugars should be used sparingly.  Include small amounts of heart-healthy fats. These types of fats include nuts, oils, and tub margarine. Limit saturated and trans fats. These fats have been shown to be harmful in the body. CHOOSING FOODS  The following food groups are based on a 2000 calorie diet. See your Registered Dietitian for individual calorie needs. Grains and Grain Products (6 to 8 servings daily)  Eat More Often: Whole-wheat bread, brown rice, whole-grain or wheat pasta, quinoa, popcorn without added fat or salt (air popped).  Eat Less Often: White bread, white pasta, white rice, cornbread. Vegetables (4 to 5 servings daily)  Eat More Often: Fresh, frozen, and canned vegetables. Vegetables may be raw, steamed, roasted, or grilled with a minimal amount of fat.  Eat Less Often/Avoid: Creamed or fried vegetables. Vegetables in a cheese sauce. Fruit (4 to 5 servings daily)  Eat More Often: All fresh, canned (in natural juice), or frozen fruits. Dried fruits without added sugar. One hundred percent fruit juice ( cup [237 mL] daily).  Eat Less Often: Dried fruits with added sugar. Canned fruit in light or heavy syrup. [employer], Fish, and Poultry (2 servings or less daily. One serving is 3 to 4 oz [85-114 g]).  Eat More Often: Ninety percent or leaner ground beef, tenderloin, sirloin. Round cuts of beef, chicken breast, [place] breast. All fish. Grill, bake, or broil your meat. Nothing should be fried.  Eat Less Often/Avoid: Fatty cuts of meat, [place], or chicken leg, thigh, or wing. Fried cuts of meat or fish. Dairy (2 to 3 servings)  Eat More  Often: Low-fat or fat-free milk, low-fat plain or light yogurt, reduced-fat or part-skim cheese.  Eat Less Often/Avoid: Milk (whole, 2%).Whole milk yogurt. Full-fat cheeses. Nuts, Seeds, and Legumes (4 to 5 servings per week)  Eat More Often: All without added salt.  Eat Less Often/Avoid: Salted nuts and seeds, canned beans with added salt. Fats and Sweets (limited)  Eat More Often: Vegetable oils, tub margarines without trans fats, sugar-free gelatin. Mayonnaise and salad dressings.  Eat Less Often/Avoid: Coconut oils, palm oils, butter, stick margarine, cream, half and half, cookies, candy, pie. FOR MORE INFORMATION The Dash Diet Eating Plan: www.dashdiet.org Document Released: 09/04/2011 Document Revised: 12/08/2011 Document Reviewed: 09/04/2011 [facility] Patient Information 2014 [place], [place]. Blood Pressure Record Sheet Your blood pressure on this visit to the Emergency Department or Clinic is elevated. This does not necessarily mean you have hypertension, but it does mean that your blood pressure needs to be rechecked. Many times blood pressure increases because of illness, pain, anxiety, or other factors. We recommend that you get a series of blood pressure readings done over a period of about 5 days. It is best to get a reading in the morning and one in the evening. You should make sure  you sit and relax for 1 to 5 minutes before the reading is taken. Write the readings down and make a follow-up appointment with your doctor to discuss the results. If there is not a free clinic or a drug store with blood pressure taking machines near you, you can purchase good blood pressure taking equipment from a drug store, often for less than the price of a physician's office call. Having one in the home also allows you the convenience of taking your blood pressure while you are home and in relaxed surroundings. Your blood pressure in the Emergency Department or Clinic on ________ was  ____________________. BLOOD PRESSURE LOG Date: _______________________  a.m. _____________________  p.m. _____________________ Date: _______________________  a.m. _____________________  p.m. _____________________ Date: _______________________  a.m. _____________________  p.m. _____________________ Date: _______________________  a.m. _____________________  p.m. _____________________ Date: _______________________  a.m. _____________________  p.m. _____________________ Document Released: 06/14/2003 Document Revised: 12/08/2011 Document Reviewed: 09/15/2005 [employer] Patient Information 2014 [place], [place].

## 2013-06-04 LAB — CBC WITH DIFFERENTIAL/PLATELET
Eosinophils Relative: 3 % (ref 0–5)
Hemoglobin: 13.3 g/dL (ref 12.0–15.0)
Lymphocytes Relative: 31 % (ref 12–46)
Lymphs Abs: 2.1 10*3/uL (ref 0.7–4.0)
MCH: 28.9 pg (ref 26.0–34.0)
MCV: 84.1 fL (ref 78.0–100.0)
Monocytes Relative: 4 % (ref 3–12)
Platelets: 313 10*3/uL (ref 150–400)
RBC: 4.6 MIL/uL (ref 3.87–5.11)
WBC: 6.6 10*3/uL (ref 4.0–10.5)

## 2013-06-08 ENCOUNTER — Encounter: Payer: Self-pay | Admitting: Family Medicine

## 2013-06-15 ENCOUNTER — Telehealth: Payer: Self-pay

## 2013-06-15 ENCOUNTER — Other Ambulatory Visit: Payer: Self-pay | Admitting: *Deleted

## 2013-06-15 NOTE — Telephone Encounter (Signed)
PT HAS A QUESTION ABOUT HER ANNUAL TB CHEST X-RAY.  PLEASE CALL  [phone]

## 2013-06-15 NOTE — Telephone Encounter (Signed)
What is question?  Left message for her to call me back.

## 2013-06-16 ENCOUNTER — Encounter: Payer: Self-pay | Admitting: Neurology

## 2013-06-16 ENCOUNTER — Ambulatory Visit (INDEPENDENT_AMBULATORY_CARE_PROVIDER_SITE_OTHER): Payer: 59 | Admitting: Neurology

## 2013-06-16 VITALS — BP 141/74 | HR 55 | Ht 66.0 in | Wt 206.0 lb

## 2013-06-16 DIAGNOSIS — R55 Syncope and collapse: Secondary | ICD-10-CM

## 2013-06-16 DIAGNOSIS — M5090 Cervical disc disorder, unspecified, unspecified cervical region: Secondary | ICD-10-CM

## 2013-06-16 NOTE — Progress Notes (Signed)
[facility]  Provider:  Dr Elizabeth Lin Referring Provider: Sherren Mocha, MD Primary Care Physician:  Elizabeth Sorenson, MD  CC:  dizziness  HPI:  Elizabeth Lin is a 67 y.o. female here as a referral from Dr. Clelia Lin for dizziness   For past year notes dizzy sensation when tilting her head back or looking to the sides. Also notes episodes of dizziness with standing. No vertigo. No graying or blacking out of her vision. Goes away on its own. Feels lightheaded, like going to pass out. No palpitations, no change in vision. Gets some numbness in her arms and legs which are brief episodes, improve with movement. Has had episodes where she hyperextends here neck but no trauma to her neck. Has history of arthritis in her neck, told she his degenerative disease in her neck. Last imaging of her neck was over 4 years ago.  No cardiac history.  Feels she has some memory difficulty but had cognitive testing done showing findings consistent with a reading/learning disorder but otherwise normal.   Review of Systems: Out of a complete 14 system review, the patient complains of only the following symptoms, and all other reviewed systems are negative.   History   Social History  . Marital Status: Single    Spouse Name: N/A    Number of Children: N/A  . Years of Education: N/A   Occupational History  . Not on file.   Social History Main Topics  . Smoking status: Former Smoker    Types: Cigarettes    Quit date: 11/01/1974  . Smokeless tobacco: Former Neurosurgeon    Quit date: 06/11/1982  . Alcohol Use: No  . Drug Use: No  . Sexual Activity: No   Other Topics Concern  . Not on file   Social History Narrative  . No narrative on file    Family History  Problem Relation Age of Onset  . Diabetes Father   . Thyroid disease Mother   . Hypertension Brother   . Thyroid disease Daughter   . Depression Son     Past Medical History  Diagnosis Date  . Allergy   . Arthritis   . Depression    . Thyroid disease   . Anxiety     Past Surgical History  Procedure Laterality Date  . Appendectomy      Current Outpatient Prescriptions  Medication Sig Dispense Refill  . Calcium-Vitamin D-Vitamin K (CALCIUM + D + K PO) Take by mouth.      . DULoxetine (CYMBALTA) 20 MG capsule Take 1 capsule (20 mg total) by mouth daily. Needs office visit/labs  30 capsule  11  . fish oil-omega-3 fatty acids 1000 MG capsule Take 2 g by mouth daily.      . fluticasone (FLONASE) 50 MCG/ACT nasal spray Place 2 sprays into the nose as needed.       Marland Kitchen levothyroxine (SYNTHROID, LEVOTHROID) 150 MCG tablet Take 1 tablet (150 mcg total) by mouth daily. Needs office visit/labs  30 tablet  11  . Multiple Vitamins-Minerals (MULTIVITAMIN WITH MINERALS) tablet Take 1 tablet by mouth daily.      . predniSONE (DELTASONE) 20 MG tablet       . traMADol (ULTRAM) 50 MG tablet        No current facility-administered medications for this visit.    Allergies as of 06/16/2013 - Review Complete 06/03/2013  Allergen Reaction Noted  . Amoxicillin-pot clavulanate  11/02/2011   manual blood pressure check showed no signs of orthostatic  hypotension  Vitals: There were no vitals taken for this visit. Last Weight:  Wt Readings from Last 1 Encounters:  06/03/13 203 lb (92.08 kg)   Last Height:   Ht Readings from Last 1 Encounters:  06/03/13 5' 5.75" (1.67 m)     Physical exam: Exam: Gen: NAD, conversant Eyes: anicteric sclerae, moist conjunctivae HENT: Atraumati Neck: Trachea midline; supple,  Lungs: CTA, no wheezing, rales, rhonic                          CV: RRR, no MRG, no carotid bruits Abdomen: Soft, non-tender;  Extremities: No peripheral edema  Skin: Normal temperature, no rash,  Psych: Appropriate affect, pleasant  Neuro: MS: AA&Ox3, appropriately interactive, normal affect   Speech: fluent w/o paraphasic error  Memory: good recent and remote recall  CN: PERRL, EOMI no nystagmus, no ptosis,  sensation intact to LT V1-V3 bilat, face symmetric, no weakness, hearing grossly intact, palate elevates symmetrically, shoulder shrug 5/5 bilat,  tongue protrudes midline, no fasiculations noted.  Motor: normal bulk and tone Strength: 5/5  In all extremities  Coord: rapid alternating and point-to-point (FNF, HTS) movements intact.  Reflexes: symmetrical, bilat downgoing toes  Sens: LT intact in all extremities  Gait: posture, stance, stride and arm-swing normal. Tandem gait intact. Able to walk on heels and toes. Romberg absent.   Assessment:  After physical and neurologic examination, review of laboratory studies, imaging, neurophysiology testing and pre-existing records, assessment will be reviewed on the problem list.  Plan:  Treatment plan and additional workup will be reviewed under Problem List.  1)dizziness 2)Cervical degenerative disc disease  [name] is a pleasant 67 year old woman sent for initial evaluation of episodes of dizziness described as lightheadedness associated with extension and rotation of her neck. Has been ongoing for the past year. Episodes are brief and resolve with return of head to normal position. No visual changes or palpitations associated with the events. No vertigo. Physical exam is unremarkable, blood pressure check reveal no signs of orthostatic hypertension. Patient does note a history of degenerative disc disease in her cervical spine. Unclear etiology of her symptoms, would question if narrowing of the vessels, possibly secondary to degenerative change. Will order carotid the sun and MRI of the cervical spine.

## 2013-06-16 NOTE — Patient Instructions (Signed)
Overall you are doing fairly well but I do want to suggest a few things today:   Remember to drink plenty of fluid, eat healthy meals and do not skip any meals. Try to eat protein with a every meal and eat a healthy snack such as fruit or nuts in between meals. Try to keep a regular sleep-wake schedule and try to exercise daily, particularly in the form of walking, 20-30 minutes a day, if you can.   As far as diagnostic testing: I would like to order a carotid ultrasound and MRI of your cervical spine  I would like to see you back in 2 to 3 months, sooner if we need to. Please call us with any interim questions, concerns, problems, updates or refill requests.   Please also call us for any test results so we can go over those with you on the phone.  My clinical assistant and will answer any of your questions and relay your messages to me and also relay most of my messages to you.   Our phone number is [phone]. We also have an after hours call service for urgent matters and there is a physician on-call for urgent questions. For any emergencies you know to call 911 or go to the nearest emergency room

## 2013-06-16 NOTE — Telephone Encounter (Signed)
LMOM to Cb with question

## 2013-06-18 NOTE — Telephone Encounter (Signed)
lmom to cb. 

## 2013-06-21 ENCOUNTER — Telehealth: Payer: Self-pay

## 2013-06-21 ENCOUNTER — Ambulatory Visit (INDEPENDENT_AMBULATORY_CARE_PROVIDER_SITE_OTHER): Payer: 59

## 2013-06-21 DIAGNOSIS — Z23 Encounter for immunization: Secondary | ICD-10-CM

## 2013-06-21 NOTE — Telephone Encounter (Signed)
PT STATES HER JOB IS REQUIRING THEM TO HAVE ANOTHER TB SHOT WHICH THEY REQUIRE EACH YEAR, HAD TO HAVE THE CHEST XRAY INSTEAD AND DIDN'T KNOW IF SHE HAVE TO DO THE XRAY AGAIN PLEASE CALL [phone]

## 2013-06-21 NOTE — Telephone Encounter (Signed)
With a CXR that shows no evidence of TB disease, some guidelines say there is no need for repeat x-ray unless she develops symptoms.  Some guidelines say repeat in 10 yrs.  In any case, she should not need to repeat with a neg CXR in Feb 2014

## 2013-06-21 NOTE — Telephone Encounter (Signed)
For a positive TB skin test, how often does she need a chest xray? Her report from Feb indicates: [facility] reading (PRIMARY) by Dr. Cleta Alberts there is previous granulomatous disease. There is elevation of the right hemidiaphragm. I have called her to get more information, was she treated in the past? Etc. Left message for her to call me back. Amy

## 2013-06-21 NOTE — Telephone Encounter (Signed)
Thanks. She is asking about chest xray/        With a CXR that shows no evidence of TB disease, some guidelines say there is no need for repeat x-ray unless she develops symptoms. Some guidelines say repeat in 10 yrs. In any case, she should not need to repeat with a neg CXR in Feb 2014   Left message for her to call me back.

## 2013-06-24 NOTE — Telephone Encounter (Signed)
Spoke with pt and went over guidelines. She had CXR done in Feb of 2014, and turned in results to employer. She wanted to make sure she did not have to get another CXR for the year.

## 2013-06-28 ENCOUNTER — Ambulatory Visit: Admission: RE | Admit: 2013-06-28 | Payer: Medicare Other | Source: Ambulatory Visit

## 2013-06-28 ENCOUNTER — Ambulatory Visit
Admission: RE | Admit: 2013-06-28 | Discharge: 2013-06-28 | Disposition: A | Discharge status: Home / Self Care | Payer: Medicare Other | Source: Ambulatory Visit | Attending: Family Medicine | Admitting: Family Medicine

## 2013-06-28 ENCOUNTER — Other Ambulatory Visit: Payer: Self-pay | Admitting: Family Medicine

## 2013-06-28 ENCOUNTER — Ambulatory Visit (INDEPENDENT_AMBULATORY_CARE_PROVIDER_SITE_OTHER): Payer: 59

## 2013-06-28 DIAGNOSIS — R42 Dizziness and giddiness: Secondary | ICD-10-CM

## 2013-06-28 DIAGNOSIS — R55 Syncope and collapse: Secondary | ICD-10-CM

## 2013-06-28 DIAGNOSIS — Z1231 Encounter for screening mammogram for malignant neoplasm of breast: Secondary | ICD-10-CM

## 2013-07-01 ENCOUNTER — Ambulatory Visit
Admission: RE | Admit: 2013-07-01 | Discharge: 2013-07-01 | Disposition: A | Discharge status: Home / Self Care | Payer: 59 | Source: Ambulatory Visit | Attending: Neurology | Admitting: Neurology

## 2013-07-01 ENCOUNTER — Encounter: Payer: 59 | Admitting: Family Medicine

## 2013-07-01 DIAGNOSIS — R269 Unspecified abnormalities of gait and mobility: Secondary | ICD-10-CM

## 2013-07-01 DIAGNOSIS — M5090 Cervical disc disorder, unspecified, unspecified cervical region: Secondary | ICD-10-CM

## 2013-07-05 ENCOUNTER — Telehealth: Payer: Self-pay | Admitting: Pediatrics

## 2013-07-05 ENCOUNTER — Telehealth: Payer: Self-pay

## 2013-07-05 NOTE — Telephone Encounter (Signed)
Pt would like to know her MRI and Ultrasound results. Best# [phone]

## 2013-07-05 NOTE — Telephone Encounter (Signed)
These were done through [facility]. She is provided the number to call them. [phone]

## 2013-07-06 ENCOUNTER — Other Ambulatory Visit: Payer: Self-pay | Admitting: Neurology

## 2013-07-06 DIAGNOSIS — R2681 Unsteadiness on feet: Secondary | ICD-10-CM

## 2013-07-06 NOTE — Telephone Encounter (Signed)
Patient requesting MRI and Ultrasound results.

## 2013-07-08 ENCOUNTER — Encounter: Payer: 59 | Admitting: Family Medicine

## 2013-07-08 ENCOUNTER — Telehealth: Payer: Self-pay | Admitting: Neurology

## 2013-07-11 ENCOUNTER — Telehealth: Payer: Self-pay | Admitting: Radiology

## 2013-07-11 ENCOUNTER — Telehealth: Payer: Self-pay | Admitting: Neurology

## 2013-07-11 NOTE — Telephone Encounter (Signed)
Patient calling for results of Carotid doppler completed on 06/28/13.  Please call  [phone].   Sent message  to Dr. Hosie Poisson.  sf

## 2013-07-11 NOTE — Telephone Encounter (Signed)
Please have patient schedule a follow up visit to discuss. Thanks.

## 2013-07-13 ENCOUNTER — Ambulatory Visit (INDEPENDENT_AMBULATORY_CARE_PROVIDER_SITE_OTHER): Payer: 59 | Admitting: Neurology

## 2013-07-13 ENCOUNTER — Encounter: Payer: Self-pay | Admitting: Neurology

## 2013-07-13 VITALS — BP 140/76 | HR 59 | Ht 65.0 in | Wt 205.0 lb

## 2013-07-13 DIAGNOSIS — R42 Dizziness and giddiness: Secondary | ICD-10-CM

## 2013-07-13 DIAGNOSIS — M5090 Cervical disc disorder, unspecified, unspecified cervical region: Secondary | ICD-10-CM

## 2013-07-13 NOTE — Progress Notes (Signed)
Patient requesting MRI and Ultrasound results. [facility]  Provider:  Dr Hosie Poisson Referring Provider: Sherren Mocha, MD Primary Care Physician:  Norberto Sorenson, MD  CC:  dizziness  HPI:  Elizabeth Lin is a 67 y.o. female here for follow up visit with last visit being 05/2013. Since last visit she has had a carotid ultrasound which was normal and a MRI Cervical spine which showed prominent spondylitic changes at C 5-6 and C 6-7 (imaging reviewed).  Continues to have dizzy episodes, most notably these are occurring when she extends her head backwards lying down. They're not occurring as much with side-to-side movement. They do not occur with standing up from a sitting or flat position.  No cardiac history.  Feels she has some memory difficulty but had cognitive testing done showing findings consistent with a reading/learning disorder but otherwise normal.   Review of Systems: Out of a complete 14 system review, the patient complains of only the following symptoms, and all other reviewed systems are negative. Positive for memory loss restless legs dizziness depression anxiety too much sleep joint swelling feeling hot easy bruising blurred vision  History   Social History  . Marital Status: Single    Spouse Name: N/A    Number of Children: 3  . Years of Education: college   Occupational History  . 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 Smoking status: Former Smoker    Types: Cigarettes    Quit date: 11/01/1974  . Smokeless tobacco: Former Neurosurgeon    Quit date: 06/11/1982  . Alcohol Use: Yes     Comment: occasional wine  . Drug Use: No  . Sexual Activity: No   Other Topics Concern  . Not on file   Social History Narrative  . No narrative on file    Family History  Problem Relation Age of Onset  . Diabetes Father   . Myasthenia gravis Father   . Thyroid disease Mother   . Cancer Mother   . Depression Mother   . Hypertension  Brother   . Thyroid disease Daughter   . Depression Son     Past Medical History  Diagnosis Date  . Allergy   . Arthritis   . Depression   . Thyroid disease   . Anxiety   . Hammer toe     Past Surgical History  Procedure Laterality Date  . Appendectomy    . Tonsillectomy    . Oophorectomy Right   . Cystectomy      eye    Current Outpatient Prescriptions  Medication Sig Dispense Refill  . Calcium-Vitamin D-Vitamin K (CALCIUM + D + K PO) Take 1,500 mg by mouth daily. 1500 mg+D1000 IU      . DULoxetine (CYMBALTA) 20 MG capsule Take 1 capsule (20 mg total) by mouth daily. Needs office visit/labs  30 capsule  11  . fish oil-omega-3 fatty acids 1000 MG capsule Take 2 g by mouth daily.      . fluticasone (FLONASE) 50 MCG/ACT nasal spray Place 2 sprays into the nose as needed.       Marland Kitchen levothyroxine (SYNTHROID, LEVOTHROID) 150 MCG tablet Take 1 tablet (150 mcg total) by mouth daily. Needs office visit/labs  30 tablet  11  . Multiple Vitamins-Minerals (MULTIVITAMIN WITH MINERALS) tablet Take 1 tablet by mouth daily.       No current facility-administered medications for this visit.    Allergies as of 07/13/2013 - Review Complete  07/13/2013  Allergen Reaction Noted  . Amoxicillin-pot clavulanate  11/02/2011    Vitals: BP 140/76  Pulse 59  Ht 5\' 5"  (1.651 m)  Wt 205 lb (92.987 kg)  BMI 34.11 kg/m2 Last Weight:  Wt Readings from Last 1 Encounters:  07/13/13 205 lb (92.987 kg)   Last Height:   Ht Readings from Last 1 Encounters:  07/13/13 5\' 5"  (1.651 m)     Physical exam: Exam: Gen: NAD, conversant Eyes: anicteric sclerae, moist conjunctivae HENT: Atraumati Neck: Trachea midline; supple,  Lungs: CTA, no wheezing, rales, rhonic                          CV: RRR, no MRG, no carotid bruits Abdomen: Soft, non-tender;  Extremities: No peripheral edema  Skin: Normal temperature, no rash,  Psych: Appropriate affect, pleasant  Neuro: MS: AA&Ox3, appropriately  interactive, normal affect   Speech: fluent w/o paraphasic error  Memory: good recent and remote recall  CN: PERRL, EOMI no nystagmus, no ptosis, sensation intact to LT V1-V3 bilat, face symmetric, no weakness, hearing grossly intact, palate elevates symmetrically, shoulder shrug 5/5 bilat,  tongue protrudes midline, no fasiculations noted.  Motor: normal bulk and tone Strength: 5/5  In all extremities  Coord: rapid alternating and point-to-point (FNF, HTS) movements intact.  Reflexes: symmetrical, bilat downgoing toes  Sens: LT intact in all extremities  Gait: posture, stance, stride and arm-swing normal. Tandem gait intact. Able to walk on heels and toes. Romberg absent.   Assessment:  After physical and neurologic examination, review of laboratory studies, imaging, neurophysiology testing and pre-existing records, assessment will be reviewed on the problem list.  Plan:  Treatment plan and additional workup will be reviewed under Problem List.  1)dizziness 2)Cervical degenerative disc disease  Ms. Voges is a pleasant 67 year old woman presenting for follow up visit. MRI done showing prominent spondylitic changes at C 5-6 and C 6-7. With history of symptoms presenting/worsening with neck extension would want to r/o this as potential etiology of symptoms. Patient scheduled to follow up with spine surgery for further evaluation. Follow up as needed.

## 2013-07-14 NOTE — Telephone Encounter (Signed)
Patient has been scheduled 09/15/13 for office  visit

## 2013-07-15 ENCOUNTER — Encounter: Payer: Self-pay | Admitting: Family Medicine

## 2013-07-22 ENCOUNTER — Ambulatory Visit: Payer: 59 | Admitting: Family Medicine

## 2013-08-04 ENCOUNTER — Ambulatory Visit (INDEPENDENT_AMBULATORY_CARE_PROVIDER_SITE_OTHER): Payer: 59 | Admitting: Family Medicine

## 2013-08-04 VITALS — BP 154/88 | HR 74 | Temp 99.2°F | Resp 16 | Ht 65.5 in | Wt 199.2 lb

## 2013-08-04 DIAGNOSIS — H6123 Impacted cerumen, bilateral: Secondary | ICD-10-CM

## 2013-08-04 DIAGNOSIS — H6120 Impacted cerumen, unspecified ear: Secondary | ICD-10-CM

## 2013-08-04 DIAGNOSIS — J069 Acute upper respiratory infection, unspecified: Secondary | ICD-10-CM

## 2013-08-04 LAB — POCT RAPID STREP A (OFFICE): Rapid Strep A Screen: NEGATIVE

## 2013-08-04 MED ORDER — AZITHROMYCIN 250 MG PO TABS: ORAL_TABLET | ORAL | Status: DC

## 2013-08-04 NOTE — Progress Notes (Addendum)
[address]   [place], [place]  [address]   [phone] Subjective:    Patient ID: Elizabeth Lin, female    DOB: [birth year], 67 y.o.   MRN: [id number]  This chart was scribed for Ethelda Chick, MD by Blanchard Kelch, ED Scribe. The patient was seen in room 13. Patient's care was started at 5:50 PM.   HPI  Elizabeth Lin is a 67 y.o. female who presents to office complaining of an URI that began yesterday. She has associated symptoms of cough, sore throat, postnasal drip and headache. Her cough is not productive other than from discharge running down her throat. Her throat currently hurts on the left side and feels like a tickle in the back. It does not hurt to swallow. Her right ear has been "stuffed up" and has caused decreased hearing, but it has not been painful. She took a Zyrtec yesterday as well as Aleve two days ago for the symptoms. She reports that she had a max temperature of 99 at home last night. She denies chills, diaphoresis, shortness of breath, nausea, vomiting, diarrhea.   She has had bronchitis in the past, but has never had pneumonia. She received her flu vaccination this year for the first time.  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  Her PCP is Dr. Clelia Croft here at [facility].     Past Medical History  Diagnosis Date  . Allergy   . Arthritis   . Depression   . Thyroid disease   . Anxiety   . Hammer toe    Past Surgical History  Procedure Laterality Date  . Appendectomy    . Tonsillectomy    . Oophorectomy Right   . Cystectomy      eye   Family History  Problem Relation Age of Onset  . Diabetes Father   . Myasthenia gravis Father   . Thyroid disease Mother   . Cancer Mother   . Depression Mother   . Hypertension Brother   . Thyroid disease Daughter   . Depression Son    History   Social History  . Marital Status: Single    Spouse Name: N/A    Number of Children: 3  . Years  of Education: college   Occupational History  . 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 Smoking status: Former Smoker    Types: Cigarettes    Quit date: 11/01/1974  . Smokeless tobacco: Former Neurosurgeon    Quit date: 06/11/1982  . Alcohol Use: Yes     Comment: occasional wine  . Drug Use: No  . Sexual Activity: No   Other Topics Concern  . Not on file   Social History Narrative  . No narrative on file   Allergies  Allergen Reactions  . Amoxicillin-Pot Clavulanate     Red rash   Current Outpatient Prescriptions on File Prior to Visit  Medication Sig Dispense Refill  . Calcium-Vitamin D-Vitamin K (CALCIUM + D + K PO) Take 1,500 mg by mouth daily. 1500 mg+D1000 IU      . DULoxetine (CYMBALTA) 20 MG capsule Take 1 capsule (20 mg total) by mouth daily. Needs office visit/labs  30 capsule  11  . fish oil-omega-3 fatty acids 1000 MG capsule Take 2 g by mouth daily.      . fluticasone (FLONASE) 50 MCG/ACT nasal spray Place 2 sprays into  the nose as needed.       Marland Kitchen levothyroxine (SYNTHROID, LEVOTHROID) 150 MCG tablet Take 1 tablet (150 mcg total) by mouth daily. Needs office visit/labs  30 tablet  11  . Multiple Vitamins-Minerals (MULTIVITAMIN WITH MINERALS) tablet Take 1 tablet by mouth daily.       No current facility-administered medications on file prior to visit.     Review of Systems  Constitutional: Positive for fever. Negative for chills and diaphoresis.  HENT: Positive for rhinorrhea and sore throat. Negative for ear pain and trouble swallowing.   Respiratory: Positive for cough. Negative for shortness of breath.   Gastrointestinal: Negative for nausea, vomiting and diarrhea.  Neurological: Positive for headaches.       Objective:   Physical Exam  Nursing note and vitals reviewed. Constitutional: She is oriented to person, place, and time. She appears well-developed and well-nourished. No distress.  HENT:  Head: Normocephalic and  atraumatic.  Right Ear: Tympanic membrane normal.  Left Ear: Tympanic membrane normal.  Nose: Nose normal.  Mouth/Throat: Posterior oropharyngeal erythema present.  Right TM cerumen present.  Left TM cerumen present.  Eyes: EOM are normal.  Neck: Normal range of motion. Neck supple. No tracheal deviation present.  Cardiovascular: Normal rate, regular rhythm and normal heart sounds.   No murmur heard. Pulmonary/Chest: Effort normal and breath sounds normal. No respiratory distress. She has no wheezes. She has no rales.  Musculoskeletal: Normal range of motion.  Lymphadenopathy:    She has cervical adenopathy.  Neurological: She is alert and oriented to person, place, and time.  Skin: Skin is warm and dry.  Psychiatric: She has a normal mood and affect. Her behavior is normal.   Results for orders placed in visit on 08/04/13  POCT RAPID STREP A (OFFICE)      Result Value Range   Rapid Strep A Screen Negative  Negative    B EARS IRRIGATED BY DEBBIE WITHERSPOON; CERUMEN REMOVED BY DR. Katrinka Blazing B EARS.    Assessment & Plan:  Acute upper respiratory infections of unspecified site - Plan: POCT rapid strep A, Culture, Group A Strep  Cerumen impaction, bilateral  1.  URI:  New.  Send throat culture; treat empirically with Zpack; continue Zyrtec and Flonase; sample of Mucinex DM provided and recommend using bid.  RTC for acute worsening. 2.  Cerumen Impaction B:  Recurrent; s/p ear irrigation and removal of cerumen by provider.  Meds ordered this encounter  Medications  . azithromycin (ZITHROMAX) 250 MG tablet    Sig: Take 2 tabs PO x 1 dose, then 1 tab PO QD x 4 days    Dispense:  6 tablet    Refill:  0    I personally performed the services described in this documentation, which was scribed in my presence.  The recorded information has been reviewed and is accurate.

## 2013-08-04 NOTE — Patient Instructions (Signed)

## 2013-08-05 ENCOUNTER — Telehealth: Payer: Self-pay | Admitting: Neurology

## 2013-08-08 NOTE — Telephone Encounter (Signed)
Returned call and patient said that she will call back

## 2013-08-10 ENCOUNTER — Encounter: Payer: Self-pay | Admitting: Family Medicine

## 2013-08-19 ENCOUNTER — Encounter: Payer: Self-pay | Admitting: Family Medicine

## 2013-08-19 ENCOUNTER — Ambulatory Visit (INDEPENDENT_AMBULATORY_CARE_PROVIDER_SITE_OTHER): Payer: 59 | Admitting: Family Medicine

## 2013-08-19 VITALS — BP 158/76 | HR 57 | Temp 97.6°F | Resp 16 | Ht 65.5 in | Wt 198.0 lb

## 2013-08-19 DIAGNOSIS — I10 Essential (primary) hypertension: Secondary | ICD-10-CM

## 2013-08-19 DIAGNOSIS — F329 Major depressive disorder, single episode, unspecified: Secondary | ICD-10-CM

## 2013-08-19 DIAGNOSIS — E669 Obesity, unspecified: Secondary | ICD-10-CM

## 2013-08-19 DIAGNOSIS — Z0000 Encounter for general adult medical examination without abnormal findings: Secondary | ICD-10-CM

## 2013-08-19 DIAGNOSIS — Z780 Asymptomatic menopausal state: Secondary | ICD-10-CM

## 2013-08-19 DIAGNOSIS — Z1211 Encounter for screening for malignant neoplasm of colon: Secondary | ICD-10-CM

## 2013-08-19 DIAGNOSIS — E039 Hypothyroidism, unspecified: Secondary | ICD-10-CM

## 2013-08-19 LAB — LIPID PANEL
Cholesterol: 251 mg/dL — ABNORMAL HIGH (ref 0–200)
HDL: 64 mg/dL (ref >39)
Triglycerides: 101 mg/dL (ref <150)

## 2013-08-19 MED ORDER — DULOXETINE HCL 20 MG PO CPEP: 20.0000 mg | ORAL_CAPSULE | Freq: Every day | ORAL | Status: DC

## 2013-08-19 MED ORDER — LEVOTHYROXINE SODIUM 150 MCG PO TABS: 150.0000 ug | ORAL_TABLET | Freq: Every day | ORAL | Status: DC

## 2013-08-19 NOTE — Progress Notes (Signed)
  Subjective:    Patient ID: Elizabeth Lin, female    DOB: 1946/06/26, 67 y.o.   MRN: [id number]  HPI    Review of Systems  Constitutional: Negative.   HENT: Positive for drooling and hearing loss.   Eyes: Negative.   Respiratory: Negative.   Cardiovascular: Positive for leg swelling.  Gastrointestinal: Negative.   Endocrine: Negative.   Genitourinary: Negative.   Musculoskeletal: Positive for joint swelling.  Skin: Negative.   Allergic/Immunologic: Positive for environmental allergies.  Neurological: Positive for dizziness and numbness.  Hematological: Bruises/bleeds easily.  Psychiatric/Behavioral: Negative.        Objective:   Physical Exam        Assessment & Plan:

## 2013-08-19 NOTE — Progress Notes (Signed)
Subjective:    Patient ID: Elizabeth Lin, female    DOB: 10-06-1945, 67 y.o.   MRN: [id number]  HPI  Last pap was from Dr. Rana Snare 2 yrs prev - was normal, no h/o abnml. Did have 1 ovary removed due to cyst during ex-lap. Had colonoscopy in [place] in 2005. [facility] - normal. No postmenopausal bleeding.  Last colonoscopy normal around 2005 - not in town.  Did NOT purchase home BP cuff yet so has not checked BP outside of office yet.  Saw neuro - Dr/ Hosie Poisson - they thought her exam was fine and more likely sxs due to cervical spondylosis so referred to neursurgeon. She still hasn't gotten the [facility] appt sched as they keep not getting the info faxed to them by neuro.  Has 3 children - oldest girl was exposed to [place] in utero - has some CP and is deaf - lives in a group home full of deaf people and staff in [place] - pt is VERY fluent in sign language but to difficult to translate though she is certified.  Middle son is a family physician for [employer] in a [place] - has several grandchildren. Youngest daughter is an Airline pilot here in [place] - has a 63 yo son who is studying Financial risk analyst at [employer] - is a Public affairs consultant. 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  Past Medical History  Diagnosis Date  . Allergy   . Arthritis   . Depression   . Thyroid disease   . Anxiety   . Hammer toe    Past Surgical History  Procedure Laterality Date  . Appendectomy    . Tonsillectomy    . Oophorectomy Right   . Cystectomy      eye   Current Outpatient Prescriptions on File Prior to Visit  Medication Sig Dispense Refill  . Calcium-Vitamin D-Vitamin K (CALCIUM + D + K PO) Take 1,500 mg by mouth daily. 1500 mg+D1000 IU      . fish oil-omega-3 fatty acids 1000 MG capsule Take 2 g by mouth daily.      . fluticasone (FLONASE) 50 MCG/ACT nasal spray Place 2 sprays into the nose as needed.       .  Multiple Vitamins-Minerals (MULTIVITAMIN WITH MINERALS) tablet Take 1 tablet by mouth daily.       No current facility-administered medications on file prior to visit.   Allergies  Allergen Reactions  . Amoxicillin-Pot Clavulanate     Red rash   Family History  Problem Relation Age of Onset  . Diabetes Father   . Myasthenia gravis Father   . Heart disease Father   . Thyroid disease Mother   . Cancer Mother   . Depression Mother   . Hypertension Brother   . Thyroid disease Daughter   . Depression Son   . Heart disease Paternal Grandmother   . Heart disease Paternal Grandfather    History   Social History  . Marital Status: Single    Spouse Name: N/A    Number of Children: 3  . Years of Education: college   Occupational History  . 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 Smoking status: Former Smoker    Types: Cigarettes    Quit date: 11/01/1974  . Smokeless tobacco: Former Neurosurgeon    Quit date: 06/11/1982  . Alcohol Use: No  Comment: occasional wine  . Drug Use: No  . Sexual Activity: No   Other Topics Concern  . None   Social History Narrative   Divorced. Education: [employer].      Review of Systems See above ROS     BP 158/76  Pulse 57  Temp(Src) 97.6 F (36.4 C) (Oral)  Resp 16  Ht 5' 5.5" (1.664 m)  Wt 198 lb (89.812 kg)  BMI 32.44 kg/m2  SpO2 99% Objective:   Physical Exam  Constitutional: She is oriented to person, place, and time. She appears well-developed and well-nourished. No distress.  HENT:  Head: Normocephalic and atraumatic.  Right Ear: Tympanic membrane, external ear and ear canal normal.  Left Ear: Tympanic membrane, external ear and ear canal normal.  Nose: Nose normal. No mucosal edema or rhinorrhea.  Mouth/Throat: Uvula is midline, oropharynx is clear and moist and mucous membranes are normal. No posterior oropharyngeal erythema.  Eyes: Conjunctivae and EOM are normal. Pupils are equal, round, and  reactive to light. Right eye exhibits no discharge. Left eye exhibits no discharge. No scleral icterus.  Neck: Normal range of motion. Neck supple. No thyromegaly present.  Cardiovascular: Normal rate, regular rhythm, normal heart sounds and intact distal pulses.   Pulmonary/Chest: Effort normal and breath sounds normal. No respiratory distress.  Abdominal: Soft. Bowel sounds are normal. There is no tenderness.  Genitourinary: No breast swelling, tenderness, discharge or bleeding.  Musculoskeletal: She exhibits edema.  Lymphadenopathy:    She has no cervical adenopathy.  Neurological: She is alert and oriented to person, place, and time.  Skin: Skin is warm and dry. She is not diaphoretic. No erythema.  Psychiatric: She has a normal mood and affect. Her behavior is normal.      Assessment & Plan:   Hypothyroid - rec recheck TSH in 6 mos.  Depression - refilled cymbalta  Essential hypertension, benign - Plan: Lipid panel - Pt is very convinced she has white coat HTN - she will buy a home BP cuff and monitor over the next sev wks - email into MyChart or call w/ values and will start med if >140/90 on sev readings.  Consider starting pt on diuretic due to chronic lower ext edema.   Routine general medical examination at a health care facility - Plan: Lipid panel.  Needs pneumovax at f/u - not in stock today.  Needs DEXA scan so referral placed. Mammogram nml 05/2013. Last pap after she turned 67 yo normal.  Needs shingles vaccine- discuss at f/u.  Special screening for malignant neoplasms, colon - Plan: Ambulatory referral to Gastroenterology - last colonoscopy was 2005 per pt - we do not have records  Obesity (BMI 30.0-34.9) - Plan: Lipid panel   Meds ordered this encounter  Medications  . OVER THE COUNTER MEDICATION    Sig: OTC Lutigold extra taking one daily  . OVER THE COUNTER MEDICATION    Sig: OTC Glucosamine Chondroitin taking  . DULoxetine (CYMBALTA) 20 MG capsule    Sig: Take  1 capsule (20 mg total) by mouth daily.    Dispense:  90 capsule    Refill:  3  . levothyroxine (SYNTHROID, LEVOTHROID) 150 MCG tablet    Sig: Take 1 tablet (150 mcg total) by mouth daily before breakfast.    Dispense:  90 tablet    Refill:  2    Norberto Sorenson, MD MPH

## 2013-08-19 NOTE — Patient Instructions (Addendum)
Check your blood pressure outside of the office - check it about 2x/wk - email in through MyChart and let me know what your pressures have been after 2 weeks.  If you haven't had your ortho visit scheduled let us know so that we can call them and see what is going on.  Pick up a copy of the MRI disc from [facility] - the spine doctor who you see will want to actually look at this disc so bring it to your appointment.  Keeping You Healthy  Get These Tests  Blood Pressure- Have your blood pressure checked by your healthcare provider at least once a year.  Normal blood pressure is 120/80.  Weight- Have your body mass index (BMI) calculated to screen for obesity.  BMI is a measure of body fat based on height and weight.  You can calculate your own BMI at https://www.west-esparza.com/  Cholesterol- Have your cholesterol checked every year.  Diabetes- Have your blood sugar checked every year if you have high blood pressure, high cholesterol, a family history of diabetes or if you are overweight.  Pap Smear- Have a pap smear every 1 to 3 years if you have been sexually active.  If you are older than 65 and recent pap smears have been normal you may not need additional pap smears.  In addition, if you have had a hysterectomy  For benign disease additional pap smears are not necessary.  Mammogram-Yearly mammograms are essential for early detection of breast cancer  Screening for Colon Cancer- Colonoscopy starting at age 103. Screening may begin sooner depending on your family history and other health conditions.  Follow up colonoscopy as directed by your Gastroenterologist.  Screening for Osteoporosis- Screening begins at age 14 with bone density scanning, sooner if you are at higher risk for developing Osteoporosis.  Get these medicines  Calcium with Vitamin D- Your body requires 1200-1500 mg of Calcium a day and [phone] IU of Vitamin D a day.  You can only absorb 500 mg of Calcium at a time  therefore Calcium must be taken in 2 or 3 separate doses throughout the day.  Hormones- Hormone therapy has been associated with increased risk for certain cancers and heart disease.  Talk to your healthcare provider about if you need relief from menopausal symptoms.  Aspirin- Ask your healthcare provider about taking Aspirin to prevent Heart Disease and Stroke.  Get these Immuniztions  Flu shot- Every fall  Pneumonia shot- Once after the age of 50; if you are younger ask your healthcare provider if you need a pneumonia shot.  Tetanus- Every ten years.  Zostavax- Once after the age of 56 to prevent shingles.  Take these steps  Don't smoke- Your healthcare provider can help you quit. For tips on how to quit, ask your healthcare provider or go to www.smokefree.gov or call 1-800 QUIT-NOW.  Be physically active- Exercise 5 days a week for a minimum of 30 minutes.  If you are not already physically active, start slow and gradually work up to 30 minutes of moderate physical activity.  Try walking, dancing, bike riding, swimming, etc.  Eat a healthy diet- Eat a variety of healthy foods such as fruits, vegetables, whole grains, low fat milk, low fat cheeses, yogurt, lean meats, chicken, fish, eggs, dried beans, tofu, etc.  For more information go to www.thenutritionsource.org  Dental visit- Brush and floss teeth twice daily; visit your dentist twice a year.  Eye exam- Visit your Optometrist or Ophthalmologist yearly.  Drink  alcohol in moderation- Limit alcohol intake to one drink or less a day.  Never drink and drive.  Depression- Your emotional health is as important as your physical health.  If you're feeling down or losing interest in things you normally enjoy, please talk to your healthcare provider.  Seat Belts- can save your life; always wear one  Smoke/Carbon Monoxide detectors- These detectors need to be installed on the appropriate level of your home.  Replace batteries at least  once a year.  Violence- If anyone is threatening or hurting you, please tell your healthcare provider.  Living Will/ Health care power of attorney- Discuss with your healthcare provider and family.

## 2013-09-08 ENCOUNTER — Encounter: Payer: Self-pay | Admitting: Family Medicine

## 2013-09-08 ENCOUNTER — Encounter: Payer: Self-pay | Admitting: Neurology

## 2013-09-14 MED ORDER — HYDROCHLOROTHIAZIDE 25 MG PO TABS: 25.0000 mg | ORAL_TABLET | Freq: Every day | ORAL | Status: DC

## 2013-09-15 ENCOUNTER — Ambulatory Visit: Payer: 59 | Admitting: Neurology

## 2013-10-07 ENCOUNTER — Encounter: Payer: Self-pay | Admitting: Neurology

## 2013-10-07 ENCOUNTER — Ambulatory Visit (INDEPENDENT_AMBULATORY_CARE_PROVIDER_SITE_OTHER): Payer: 59 | Admitting: Neurology

## 2013-10-07 VITALS — BP 155/77 | HR 59 | Ht 67.0 in | Wt 204.0 lb

## 2013-10-07 DIAGNOSIS — R42 Dizziness and giddiness: Secondary | ICD-10-CM

## 2013-10-07 NOTE — Progress Notes (Signed)
Patient requesting MRI and Ultrasound results. [facility]  Provider:  Dr Hosie Poisson Referring Provider: Sherren Mocha, MD Primary Care Physician:  Norberto Sorenson, MD  CC:  dizziness  HPI:  Elizabeth Lin is a 68 y.o. female here for follow up visit with last visit being 05/2013. Since last visit she has had a carotid ultrasound which was normal and a MRI Cervical spine which showed prominent spondylitic changes at C 5-6 and C 6-7 (imaging reviewed).Followed up with Ortho surgery who felt there was no surgical intervention indicated. Returns today and reports her symptoms have fully resolved. No acute concerns. No further episodes of dizziness, light headed sensation. No palpitations. No extremity paresthesias or weakness.  Feels she has some memory difficulty but had cognitive testing done showing findings consistent with a reading/learning disorder but otherwise normal.   Review of Systems: Out of a complete 14 system review, the patient complains of only the following symptoms, and all other reviewed systems are negative. Positive for hearing loss restless legs  History   Social History  . Marital Status: Single    Spouse Name: N/A    Number of Children: 3  . Years of Education: college   Occupational History  . 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 Smoking status: Former Smoker    Types: Cigarettes    Quit date: 11/01/1974  . Smokeless tobacco: Former Neurosurgeon    Quit date: 06/11/1982  . Alcohol Use: No     Comment: occasional wine  . Drug Use: No  . Sexual Activity: No   Other Topics Concern  . Not on file   Social History Narrative   Patient is Divorced and lives with grandson.   Patient's  Education: [employer].    Patient is right handed   Caffeine consumption 2-3 daily          Family History  Problem Relation Age of Onset  . Diabetes Father   . Myasthenia gravis Father   . Heart disease Father   . Thyroid disease Mother    . Cancer Mother   . Depression Mother   . Hypertension Brother   . Thyroid disease Daughter   . Depression Son   . Heart disease Paternal Grandmother   . Heart disease Paternal Grandfather     Past Medical History  Diagnosis Date  . Allergy   . Arthritis   . Depression   . Thyroid disease   . Anxiety   . Hammer toe     Past Surgical History  Procedure Laterality Date  . Appendectomy    . Tonsillectomy    . Oophorectomy Right   . Cystectomy      eye    Current Outpatient Prescriptions  Medication Sig Dispense Refill  . Calcium-Vitamin D-Vitamin K (CALCIUM + D + K PO) Take 1,500 mg by mouth daily. 1500 mg+D1000 IU      . DULoxetine (CYMBALTA) 20 MG capsule Take 1 capsule (20 mg total) by mouth daily.  90 capsule  3  . fish oil-omega-3 fatty acids 1000 MG capsule Take 2 g by mouth daily.      . fluticasone (FLONASE) 50 MCG/ACT nasal spray Place 2 sprays into the nose as needed.       . hydrochlorothiazide (HYDRODIURIL) 25 MG tablet Take 1 tablet (25 mg total) by mouth daily.  30 tablet  1  . levothyroxine (SYNTHROID, LEVOTHROID) 150 MCG tablet Take 1 tablet (150 mcg total) by  mouth daily before breakfast.  90 tablet  2  . Multiple Vitamins-Minerals (MULTIVITAMIN WITH MINERALS) tablet Take 1 tablet by mouth daily.      Marland Kitchen. OVER THE COUNTER MEDICATION OTC Lutigold extra taking one daily      . OVER THE COUNTER MEDICATION OTC Glucosamine Chondroitin taking       No current facility-administered medications for this visit.    Allergies as of 10/07/2013 - Review Complete 10/07/2013  Allergen Reaction Noted  . Amoxicillin-pot clavulanate  11/02/2011    Vitals: BP 155/77  Pulse 59  Ht 5\' 7"  (1.702 m)  Wt 204 lb (92.534 kg)  BMI 31.94 kg/m2 Last Weight:  Wt Readings from Last 1 Encounters:  10/07/13 204 lb (92.534 kg)   Last Height:   Ht Readings from Last 1 Encounters:  10/07/13 5\' 7"  (1.702 m)     Physical exam: Exam: Gen: NAD, conversant Eyes: anicteric  sclerae, moist conjunctivae HENT: Atraumati Neck: Trachea midline; supple,  Lungs: CTA, no wheezing, rales, rhonic                          CV: RRR, no MRG, no carotid bruits Abdomen: Soft, non-tender;  Extremities: No peripheral edema  Skin: Normal temperature, no rash,  Psych: Appropriate affect, pleasant  Neuro: MS: AA&Ox3, appropriately interactive, normal affect   Speech: fluent w/o paraphasic error  Memory: good recent and remote recall  CN: PERRL, EOMI no nystagmus, no ptosis, sensation intact to LT V1-V3 bilat, face symmetric, no weakness, hearing grossly intact, palate elevates symmetrically, shoulder shrug 5/5 bilat,  tongue protrudes midline, no fasiculations noted.  Motor: normal bulk and tone Strength: 5/5  In all extremities  Coord: rapid alternating and point-to-point (FNF, HTS) movements intact.  Reflexes: symmetrical, bilat downgoing toes  Sens: LT intact in all extremities  Gait: posture, stance, stride and arm-swing normal. Tandem gait intact. Able to walk on heels and toes. Romberg absent.   Assessment:  After physical and neurologic examination, review of laboratory studies, imaging, neurophysiology testing and pre-existing records, assessment will be reviewed on the problem list.  Plan:  Treatment plan and additional workup will be reviewed under Problem List.  1)dizziness 2)Cervical degenerative disc disease  Elizabeth Lin is a pleasant 68 year old woman presenting for follow up visit. MRI done showing prominent spondylitic changes at C 5-6 and C 6-7. Currently asymptomatic. Unclear etiology of symptoms. Question possible orthostatic hypotension. Counseled patient to hydrate well. Follow up as needed.

## 2013-10-29 ENCOUNTER — Ambulatory Visit (INDEPENDENT_AMBULATORY_CARE_PROVIDER_SITE_OTHER): Payer: 59 | Admitting: Internal Medicine

## 2013-10-29 VITALS — BP 170/86 | HR 65 | Temp 98.4°F | Resp 20 | Ht 66.0 in | Wt 201.6 lb

## 2013-10-29 DIAGNOSIS — J069 Acute upper respiratory infection, unspecified: Secondary | ICD-10-CM

## 2013-10-29 DIAGNOSIS — R059 Cough, unspecified: Secondary | ICD-10-CM

## 2013-10-29 DIAGNOSIS — R109 Unspecified abdominal pain: Secondary | ICD-10-CM

## 2013-10-29 DIAGNOSIS — R05 Cough: Secondary | ICD-10-CM

## 2013-10-29 LAB — POCT INFLUENZA A/B
INFLUENZA A, POC: NEGATIVE
Influenza B, POC: NEGATIVE

## 2013-10-29 LAB — POCT UA - MICROSCOPIC ONLY
CRYSTALS, UR, HPF, POC: NEGATIVE
Casts, Ur, LPF, POC: NEGATIVE
Mucus, UA: NEGATIVE
YEAST UA: NEGATIVE

## 2013-10-29 LAB — POCT URINALYSIS DIPSTICK
Bilirubin, UA: NEGATIVE
GLUCOSE UA: NEGATIVE
KETONES UA: NEGATIVE
Leukocytes, UA: NEGATIVE
NITRITE UA: NEGATIVE
Protein, UA: NEGATIVE
RBC UA: NEGATIVE
SPEC GRAV UA: 1.015
Urobilinogen, UA: 0.2
pH, UA: 7.5

## 2013-10-29 MED ORDER — HYDROCODONE-HOMATROPINE 5-1.5 MG/5ML PO SYRP: 5.0000 mL | ORAL_SOLUTION | Freq: Four times a day (QID) | ORAL | Status: DC | PRN

## 2013-10-29 NOTE — Progress Notes (Signed)
Subjective:    Patient ID: Elizabeth Lin, female    DOB: [birth year], 68 y.o.   MRN: [id number] This chart was scribed for Ellamae Siaobert Doolittle, MD by Nicholos Johnsenise Iheanachor, Medical Scribe. This patient's care was started at 11:51 AM.  Cough Associated symptoms include chills.  Flank Pain   HPI Comments: Elizabeth Lin is a 68 y.o. female who presents to the [facility] complaining of chills, sore throat, clear and productive cough. Pt denies body aches but did states she feels "something." Denies HA, fever. Pt had a flu shot this year.  Pt states she has also has intermittent pain in her L lower abdomen ongoing for the past couple of weeks. Pt states she has a BM everyday and pain usually resolves afterwards. Denies constip or stool bld. States she saw blood 1 week ago in urine. No cramping.  No stones or dysuria/freq. Review of Systems  Constitutional: Positive for chills. Negative for fatigue and unexpected weight change.  Respiratory: Positive for cough.   Genitourinary: Positive for flank pain.   Objective:  Physical Exam  Vitals reviewed. Constitutional: She is oriented to person, place, and time. She appears well-developed and well-nourished. No distress.  HENT:  Head: Normocephalic and atraumatic.  Mouth/Throat: Oropharynx is clear and moist.  Clear rhinorrhea  Eyes: Conjunctivae and EOM are normal.  Neck: Neck supple. No thyromegaly present.  Cardiovascular: Normal rate, regular rhythm and normal heart sounds.  Exam reveals no gallop and no friction rub.   No murmur heard. Pulmonary/Chest: Effort normal and breath sounds normal. No respiratory distress. She has no wheezes. She has no rales.  Abdominal: Soft. Bowel sounds are normal. She exhibits no mass. There is no tenderness. There is no rebound and no guarding.  Musculoskeletal: Normal range of motion.  Lymphadenopathy:    She has no cervical adenopathy.  Neurological: She is alert and oriented to  person, place, and time.  Skin: Skin is warm and dry.  Psychiatric: She has a normal mood and affect. Her behavior is normal.   Results for orders placed in visit on 10/29/13  POCT INFLUENZA A/B      Result Value Range   Influenza A, POC Negative     Influenza B, POC Negative    POCT UA - MICROSCOPIC ONLY      Result Value Range   WBC, Ur, HPF, POC 0-2     RBC, urine, microscopic 0-1     Bacteria, U Microscopic TRACE     Mucus, UA NEG     Epithelial cells, urine per micros 0-2     Crystals, Ur, HPF, POC NEG     Casts, Ur, LPF, POC NEG     Yeast, UA NEG    POCT URINALYSIS DIPSTICK      Result Value Range   Color, UA YELLOW     Clarity, UA CLEAR     Glucose, UA NEG     Bilirubin, UA NEG     Ketones, UA NEG     Spec Grav, UA 1.015     Blood, UA NEG     pH, UA 7.5     Protein, UA NEG     Urobilinogen, UA 0.2     Nitrite, UA NEG     Leukocytes, UA Negative     Assessment & Plan:  Pt will have a flu test performed along with a urine test.   I have completed the patient encounter in its entirety as documented by  the scribe, with editing by me where necessary. Robert P. Merla Riches, M.D. URI (upper respiratory infection)  Flank pain - Plan: POCT UA - Microscopic Only, POCT urinalysis dipstick  Cough - Plan: POCT Influenza A/B  Meds ordered this encounter  Medications  . HYDROcodone-homatropine (HYCODAN) 5-1.5 MG/5ML syrup    Sig: Take 5 mLs by mouth every 6 (six) hours as needed for cough.    Dispense:  120 mL    Refill:  0   To use miralax daily for 7 days then reck LLQ and repeat urine

## 2013-11-03 ENCOUNTER — Ambulatory Visit: Payer: 59

## 2013-11-03 ENCOUNTER — Ambulatory Visit (INDEPENDENT_AMBULATORY_CARE_PROVIDER_SITE_OTHER): Payer: 59 | Admitting: Family Medicine

## 2013-11-03 VITALS — BP 132/78 | HR 63 | Temp 98.3°F | Resp 16 | Ht 66.0 in | Wt 201.0 lb

## 2013-11-03 DIAGNOSIS — J0130 Acute sphenoidal sinusitis, unspecified: Secondary | ICD-10-CM

## 2013-11-03 DIAGNOSIS — R05 Cough: Secondary | ICD-10-CM

## 2013-11-03 DIAGNOSIS — J069 Acute upper respiratory infection, unspecified: Secondary | ICD-10-CM

## 2013-11-03 DIAGNOSIS — R062 Wheezing: Secondary | ICD-10-CM

## 2013-11-03 DIAGNOSIS — R059 Cough, unspecified: Secondary | ICD-10-CM

## 2013-11-03 DIAGNOSIS — J9801 Acute bronchospasm: Secondary | ICD-10-CM

## 2013-11-03 LAB — POCT CBC
Granulocyte percent: 47.4 %G (ref 37–80)
HEMATOCRIT: 34.6 % — AB (ref 37.7–47.9)
Hemoglobin: 10.6 g/dL — AB (ref 12.2–16.2)
LYMPH, POC: 1.7 (ref 0.6–3.4)
MCH, POC: 27.5 pg (ref 27–31.2)
MCHC: 30.6 g/dL — AB (ref 31.8–35.4)
MCV: 89.6 fL (ref 80–97)
MID (cbc): 0.3 (ref 0–0.9)
MPV: 8.3 fL (ref 0–99.8)
POC Granulocyte: 1.8 — AB (ref 2–6.9)
POC LYMPH PERCENT: 44.8 %L (ref 10–50)
POC MID %: 7.8 % (ref 0–12)
Platelet Count, POC: 225 10*3/uL (ref 142–424)
RBC: 3.86 M/uL — AB (ref 4.04–5.48)
RDW, POC: 15.3 %
WBC: 3.7 10*3/uL — AB (ref 4.6–10.2)

## 2013-11-03 MED ORDER — AZITHROMYCIN 250 MG PO TABS: ORAL_TABLET | ORAL | Status: DC

## 2013-11-03 MED ORDER — PREDNISONE 20 MG PO TABS: ORAL_TABLET | ORAL | Status: DC

## 2013-11-03 MED ORDER — ALBUTEROL SULFATE (2.5 MG/3ML) 0.083% IN NEBU
2.5000 mg | INHALATION_SOLUTION | Freq: Once | RESPIRATORY_TRACT | Status: AC
  Administered 2013-11-03: 2.5 mg via RESPIRATORY_TRACT

## 2013-11-03 MED ORDER — IPRATROPIUM BROMIDE 0.03 % NA SOLN: 2.0000 | Freq: Two times a day (BID) | NASAL | Status: DC

## 2013-11-03 NOTE — Patient Instructions (Signed)

## 2013-11-03 NOTE — Progress Notes (Signed)
Patient ID: Elizabeth Lin, female   DOB: Jun 18, 1946, 68 y.o.   MRN: [id number]   Subjective:    Patient ID: Elizabeth BlakesCarol R Lin, female    DOB: Jun 18, 1946, 68 y.o.   MRN: [id number]  11/03/2013  Pt presents today for a recheck from her office visit with Dr. Merla Richesoolittle on 10/29/13. Did the Miralax for 7 days and her constipation and LLQ pain went away.  Diagnosed with URI last week; treated with cough syrup.  Yesterday and today left ear has been stopped up and she hears crackles in her chest and has felt that for a couple of days. Has been having HA's. Coughing up yellow phlegm. Has mild laryngitis and PND. No fever, chills, ear pain, ST, nasal congestion.   Has tried Zyrtec for the congestion. No h/o of asthma. Hasn't smoked in 35 years.  HPI  Review of Systems  Constitutional: Negative for fever and chills.  HENT: Positive for congestion, hearing loss, postnasal drip, rhinorrhea, sinus pressure and voice change. Negative for ear pain, sore throat and trouble swallowing.   Eyes: Negative for pain, discharge, redness and itching.  Respiratory: Positive for cough and wheezing. Negative for chest tightness and shortness of breath.   Gastrointestinal: Positive for constipation. Negative for nausea, vomiting, abdominal pain and diarrhea.  Genitourinary: Negative for dysuria, urgency, frequency and hematuria.  Neurological: Positive for headaches. Negative for dizziness.    Past Medical History  Diagnosis Date  . Allergy   . Arthritis   . Depression   . Thyroid disease   . Anxiety   . Hammer toe    Allergies  Allergen Reactions  . Amoxicillin-Pot Clavulanate     Red rash   Current Outpatient Prescriptions  Medication Sig Dispense Refill  . Calcium-Vitamin D-Vitamin K (CALCIUM + D + K PO) Take 1,500 mg by mouth daily. 1500 mg+D1000 IU    . DULoxetine (CYMBALTA) 20 MG capsule Take 1 capsule (20 mg total) by mouth daily. 90 capsule 3  . fish oil-omega-3 fatty acids 1000 MG capsule  Take 2 g by mouth daily.    . fluticasone (FLONASE) 50 MCG/ACT nasal spray Place 2 sprays into the nose as needed.     . hydrochlorothiazide (HYDRODIURIL) 25 MG tablet Take 1 tablet (25 mg total) by mouth daily. 30 tablet 1  . HYDROcodone-homatropine (HYCODAN) 5-1.5 MG/5ML syrup Take 5 mLs by mouth every 6 (six) hours as needed for cough. 120 mL 0  . levothyroxine (SYNTHROID, LEVOTHROID) 150 MCG tablet Take 1 tablet (150 mcg total) by mouth daily before breakfast. 90 tablet 2  . Multiple Vitamins-Minerals (MULTIVITAMIN WITH MINERALS) tablet Take 1 tablet by mouth daily.    Marland Kitchen. OVER THE COUNTER MEDICATION OTC Lutigold extra taking one daily    . OVER THE COUNTER MEDICATION OTC Glucosamine Chondroitin taking    . azithromycin (ZITHROMAX) 250 MG tablet Two tablets daily x 5 days 10 tablet 0  . ipratropium (ATROVENT) 0.03 % nasal spray Place 2 sprays into the nose 2 (two) times daily. 30 mL 0  . predniSONE (DELTASONE) 20 MG tablet Two tablets daily x 5 days then one tablet daily x 5 days 15 tablet 0   No current facility-administered medications for this visit.       Objective:    BP 132/78 mmHg  Pulse 63  Temp(Src) 98.3 F (36.8 C) (Oral)  Resp 16  Ht 5\' 6"  (1.676 m)  Wt 201 lb (91.173 kg)  BMI 32.46 kg/m2  SpO2 98% Physical Exam  Constitutional: She is oriented to person, place, and time. She appears well-developed and well-nourished. No distress.  HENT:  Head: Normocephalic and atraumatic.  Right Ear: External ear normal.  Left Ear: External ear normal.  Nose: Mucosal edema and rhinorrhea present. Right sinus exhibits no maxillary sinus tenderness and no frontal sinus tenderness. Left sinus exhibits no maxillary sinus tenderness and no frontal sinus tenderness.  Mouth/Throat: Mucous membranes are normal. Posterior oropharyngeal erythema present. No oropharyngeal exudate, posterior oropharyngeal edema or tonsillar abscesses.  Eyes: Conjunctivae and EOM are normal. Pupils are equal,  round, and reactive to light.  Neck: Normal range of motion. Neck supple.  Cardiovascular: Normal rate, regular rhythm and normal heart sounds.  Exam reveals no gallop and no friction rub.   No murmur heard. Pulmonary/Chest: Effort normal. She has no decreased breath sounds. She has wheezes. She has no rhonchi. She has no rales.  End expiratory wheezes B bases lungs.  Abdominal: Soft. She exhibits no distension. There is no tenderness.  Lymphadenopathy:    She has no cervical adenopathy.  Neurological: She is alert and oriented to person, place, and time.  Skin: Skin is warm and dry. No rash noted. She is not diaphoretic.  Psychiatric: She has a normal mood and affect. Her behavior is normal.   ALBUTEROL NEBULIZER ADMINISTERED IN OFFICE.    Results for orders placed or performed in visit on 11/03/13  POCT CBC  Result Value Ref Range   WBC 3.7 (A) 4.6 - 10.2 K/uL   Lymph, poc 1.7 0.6 - 3.4   POC LYMPH PERCENT 44.8 10 - 50 %L   MID (cbc) 0.3 0 - 0.9   POC MID % 7.8 0 - 12 %M   POC Granulocyte 1.8 (A) 2 - 6.9   Granulocyte percent 47.4 37 - 80 %G   RBC 3.86 (A) 4.04 - 5.48 M/uL   Hemoglobin 10.6 (A) 12.2 - 16.2 g/dL   HCT, POC 16.1 (A) 09.6 - 47.9 %   MCV 89.6 80 - 97 fL   MCH, POC 27.5 27 - 31.2 pg   MCHC 30.6 (A) 31.8 - 35.4 g/dL   RDW, POC 04.5 %   Platelet Count, POC 225 142 - 424 K/uL   MPV 8.3 0 - 99.8 fL   [facility] reading (PRIMARY) by  Dr. Katrinka Blazing. CXR:  NAD.   Assessment & Plan:  Cough - Plan: POCT CBC, DG Chest 2 View, albuterol (PROVENTIL) (2.5 MG/3ML) 0.083% nebulizer solution 2.5 mg  Wheezing - Plan: POCT CBC, DG Chest 2 View, albuterol (PROVENTIL) (2.5 MG/3ML) 0.083% nebulizer solution 2.5 mg  Sinusitis, acute, sphenoidal  Bronchospasm   1. Acute sinusitis:  New. Following URI.  Rx for Zithromax, Atrovent nasal spray provided. 2. Acute bronchospasm:  New. S/p nebulizer in office; CXR negative.  Rx for Prednisone provided.  RTC for acute worsening.   Meds  ordered this encounter  Medications  . albuterol (PROVENTIL) (2.5 MG/3ML) 0.083% nebulizer solution 2.5 mg    Sig:   . azithromycin (ZITHROMAX) 250 MG tablet    Sig: Two tablets daily x 5 days    Dispense:  10 tablet    Refill:  0  . predniSONE (DELTASONE) 20 MG tablet    Sig: Two tablets daily x 5 days then one tablet daily x 5 days    Dispense:  15 tablet    Refill:  0  . ipratropium (ATROVENT) 0.03 % nasal spray    Sig: Place 2 sprays into the nose 2 (two)  times daily.    Dispense:  30 mL    Refill:  0    No Follow-up on file.    .I personally performed the services described in this documentation, which was scribed in my presence.  The recorded information has been reviewed and is accurate.  Nilda Simmer, M.D.  [facility] [address] [place], [place]  [address] [phone] phone [phone] fax

## 2013-11-17 ENCOUNTER — Other Ambulatory Visit: Payer: Self-pay | Admitting: Emergency Medicine

## 2014-01-31 LAB — HM COLONOSCOPY

## 2014-07-14 ENCOUNTER — Other Ambulatory Visit: Payer: Self-pay

## 2014-07-25 ENCOUNTER — Telehealth: Payer: Self-pay | Admitting: Family Medicine

## 2014-07-25 NOTE — Telephone Encounter (Signed)
Sent reminder to pt to get zostavax and colonoscopy

## 2014-08-13 ENCOUNTER — Other Ambulatory Visit: Payer: Self-pay | Admitting: Family Medicine

## 2014-09-01 ENCOUNTER — Ambulatory Visit (INDEPENDENT_AMBULATORY_CARE_PROVIDER_SITE_OTHER): Payer: 59 | Admitting: Family Medicine

## 2014-09-01 ENCOUNTER — Encounter: Payer: Self-pay | Admitting: Family Medicine

## 2014-09-01 VITALS — BP 130/80 | HR 80 | Temp 98.4°F | Resp 18 | Wt 211.0 lb

## 2014-09-01 DIAGNOSIS — Z23 Encounter for immunization: Secondary | ICD-10-CM

## 2014-09-01 DIAGNOSIS — N949 Unspecified condition associated with female genital organs and menstrual cycle: Secondary | ICD-10-CM

## 2014-09-01 DIAGNOSIS — H6123 Impacted cerumen, bilateral: Secondary | ICD-10-CM

## 2014-09-01 DIAGNOSIS — F329 Major depressive disorder, single episode, unspecified: Secondary | ICD-10-CM

## 2014-09-01 DIAGNOSIS — E039 Hypothyroidism, unspecified: Secondary | ICD-10-CM

## 2014-09-01 DIAGNOSIS — N898 Other specified noninflammatory disorders of vagina: Secondary | ICD-10-CM

## 2014-09-01 DIAGNOSIS — E559 Vitamin D deficiency, unspecified: Secondary | ICD-10-CM

## 2014-09-01 DIAGNOSIS — R1032 Left lower quadrant pain: Secondary | ICD-10-CM

## 2014-09-01 DIAGNOSIS — F32A Depression, unspecified: Secondary | ICD-10-CM

## 2014-09-01 DIAGNOSIS — R635 Abnormal weight gain: Secondary | ICD-10-CM

## 2014-09-01 DIAGNOSIS — N939 Abnormal uterine and vaginal bleeding, unspecified: Secondary | ICD-10-CM

## 2014-09-01 LAB — IFOBT (OCCULT BLOOD): IFOBT: NEGATIVE

## 2014-09-01 LAB — COMPREHENSIVE METABOLIC PANEL
ALBUMIN: 4 g/dL (ref 3.5–5.2)
ALT: 35 U/L (ref 0–35)
AST: 33 U/L (ref 0–37)
Alkaline Phosphatase: 78 U/L (ref 39–117)
BUN: 16 mg/dL (ref 6–23)
CALCIUM: 9.3 mg/dL (ref 8.4–10.5)
CHLORIDE: 103 meq/L (ref 96–112)
CO2: 27 mEq/L (ref 19–32)
Creat: 0.72 mg/dL (ref 0.50–1.10)
Glucose, Bld: 81 mg/dL (ref 70–99)
POTASSIUM: 4.1 meq/L (ref 3.5–5.3)
Sodium: 138 mEq/L (ref 135–145)
TOTAL PROTEIN: 6.8 g/dL (ref 6.0–8.3)
Total Bilirubin: 0.4 mg/dL (ref 0.2–1.2)

## 2014-09-01 LAB — GLUCOSE, POCT (MANUAL RESULT ENTRY): POC Glucose: 89 mg/dl (ref 70–99)

## 2014-09-01 LAB — POCT GLYCOSYLATED HEMOGLOBIN (HGB A1C): Hemoglobin A1C: 5.3

## 2014-09-01 NOTE — Patient Instructions (Addendum)
Consider trying the "weigh to wellness" classes at [facility] which are taught by Dr. Wyona AlmasJeannie Sykes - the best nutritionist/dietician in town.  I referred you to her clinic as well. Most people respond to low carbohydrate diets like [place].  Exercise to Lose Weight Exercise and a healthy diet may help you lose weight. Your doctor may suggest specific exercises. EXERCISE IDEAS AND TIPS  Choose low-cost things you enjoy doing, such as walking, bicycling, or exercising to workout videos.  Take stairs instead of the elevator.  Walk during your lunch break.  Park your car further away from work or school.  Go to a gym or an exercise class.  Start with 5 to 10 minutes of exercise each day. Build up to 30 minutes of exercise 4 to 6 days a week.  Wear shoes with good support and comfortable clothes.  Stretch before and after working out.  Work out until you breathe harder and your heart beats faster.  Drink extra water when you exercise.  Do not do so much that you hurt yourself, feel dizzy, or get very short of breath. Exercises that burn about 150 calories:  Running 1  miles in 15 minutes.  Playing volleyball for 45 to 60 minutes.  Washing and waxing a car for 45 to 60 minutes.  Playing touch football for 45 minutes.  Walking 1  miles in 35 minutes.  Pushing a stroller 1  miles in 30 minutes.  Playing basketball for 30 minutes.  Raking leaves for 30 minutes.  Bicycling 5 miles in 30 minutes.  Walking 2 miles in 30 minutes.  Dancing for 30 minutes.  Shoveling snow for 15 minutes.  Swimming laps for 20 minutes.  Walking up stairs for 15 minutes.  Bicycling 4 miles in 15 minutes.  Gardening for 30 to 45 minutes.  Jumping rope for 15 minutes.  Washing windows or floors for 45 to 60 minutes. Document Released: 10/18/2010 Document Revised: 12/08/2011 Document Reviewed: 10/18/2010 [facility] Patient Information 2015 [place]. This information is not  intended to replace advice given to you by your health care provider. Make sure you discuss any questions you have with your health care provider. Calorie Counting for Weight Loss Calories are energy you get from the things you eat and drink. Your body uses this energy to keep you going throughout the day. The number of calories you eat affects your weight. When you eat more calories than your body needs, your body stores the extra calories as fat. When you eat fewer calories than your body needs, your body burns fat to get the energy it needs. Calorie counting means keeping track of how many calories you eat and drink each day. If you make sure to eat fewer calories than your body needs, you should lose weight. In order for calorie counting to work, you will need to eat the number of calories that are right for you in a day to lose a healthy amount of weight per week. A healthy amount of weight to lose per week is usually 1-2 lb (0.5-0.9 kg). A dietitian can determine how many calories you need in a day and give you suggestions on how to reach your calorie goal.  WHAT IS MY MY PLAN? My goal is to have __________ calories per day.  If I have this many calories per day, I should lose around __________ pounds per week. WHAT DO I NEED TO KNOW ABOUT CALORIE COUNTING? In order to meet your daily calorie goal,  you will need to: Find out how many calories are in each food you would like to eat. Try to do this before you eat. Decide how much of the food you can eat. Write down what you ate and how many calories it had. Doing this is called keeping a food log. WHERE DO I FIND CALORIE INFORMATION? The number of calories in a food can be found on a Nutrition Facts label. Note that all the information on a label is based on a specific serving of the food. If a food does not have a Nutrition Facts label, try to look up the calories online or ask your dietitian for help. HOW DO I DECIDE HOW MUCH TO EAT? To decide  how much of the food you can eat, you will need to consider both the number of calories in one serving and the size of one serving. This information can be found on the Nutrition Facts label. If a food does not have a Nutrition Facts label, look up the information online or ask your dietitian for help. Remember that calories are listed per serving. If you choose to have more than one serving of a food, you will have to multiply the calories per serving by the amount of servings you plan to eat. For example, the label on a package of bread might say that a serving size is 1 slice and that there are 90 calories in a serving. If you eat 1 slice, you will have eaten 90 calories. If you eat 2 slices, you will have eaten 180 calories. HOW DO I KEEP A FOOD LOG? After each meal, record the following information in your food log: What you ate. How much of it you ate. How many calories it had. Then, add up your calories. Keep your food log near you, such as in a small notebook in your pocket. Another option is to use a mobile app or website. Some programs will calculate calories for you and show you how many calories you have left each time you add an item to the log. WHAT ARE SOME CALORIE COUNTING TIPS? Use your calories on foods and drinks that will fill you up and not leave you hungry. Some examples of this include foods like nuts and nut butters, vegetables, lean proteins, and high-fiber foods (more than 5 g fiber per serving). Eat nutritious foods and avoid empty calories. Empty calories are calories you get from foods or beverages that do not have many nutrients, such as candy and soda. It is better to have a nutritious high-calorie food (such as an avocado) than a food with few nutrients (such as a bag of chips). Know how many calories are in the foods you eat most often. This way, you do not have to look up how many calories they have each time you eat them. Look out for foods that may seem like  low-calorie foods but are really high-calorie foods, such as baked goods, soda, and fat-free candy. Pay attention to calories in drinks. Drinks such as sodas, specialty coffee drinks, alcohol, and juices have a lot of calories yet do not fill you up. Choose low-calorie drinks like water and diet drinks. Focus your calorie counting efforts on higher calorie items. Logging the calories in a garden salad that contains only vegetables is less important than calculating the calories in a milk shake. Find a way of tracking calories that works for you. Get creative. Most people who are successful find ways to keep track of  how much they eat in a day, even if they do not count every calorie. WHAT ARE SOME PORTION CONTROL TIPS? Know how many calories are in a serving. This will help you know how many servings of a certain food you can have. Use a measuring cup to measure serving sizes. This is helpful when you start out. With time, you will be able to estimate serving sizes for some foods. Take some time to put servings of different foods on your favorite plates, bowls, and cups so you know what a serving looks like. Try not to eat straight from a bag or box. Doing this can lead to overeating. Put the amount you would like to eat in a cup or on a plate to make sure you are eating the right portion. Use smaller plates, glasses, and bowls to prevent overeating. This is a quick and easy way to practice portion control. If your plate is smaller, less food can fit on it. Try not to multitask while eating, such as watching TV or using your computer. If it is time to eat, sit down at a table and enjoy your food. Doing this will help you to start recognizing when you are full. It will also make you more aware of what and how much you are eating. HOW CAN I CALORIE COUNT WHEN EATING OUT? Ask for smaller portion sizes or child-sized portions. Consider sharing an entree and sides instead of getting your own entree. If you  get your own entree, eat only half. Ask for a box at the beginning of your meal and put the rest of your entree in it so you are not tempted to eat it. Look for the calories on the menu. If calories are listed, choose the lower calorie options. Choose dishes that include vegetables, fruits, whole grains, low-fat dairy products, and lean protein. Focusing on smart food choices from each of the 5 food groups can help you stay on track at restaurants. Choose items that are boiled, broiled, grilled, or steamed. Choose water, milk, unsweetened iced tea, or other drinks without added sugars. If you want an alcoholic beverage, choose a lower calorie option. For example, a regular margarita can have up to 700 calories and a glass of wine has around 150. Stay away from items that are buttered, battered, fried, or served with cream sauce. Items labeled "crispy" are usually fried, unless stated otherwise. Ask for dressings, sauces, and syrups on the side. These are usually very high in calories, so do not eat much of them. Watch out for salads. Many people think salads are a healthy option, but this is often not the case. Many salads come with bacon, fried chicken, lots of cheese, fried chips, and dressing. All of these items have a lot of calories. If you want a salad, choose a garden salad and ask for grilled meats or steak. Ask for the dressing on the side, or ask for olive oil and vinegar or lemon to use as dressing. Estimate how many servings of a food you are given. For example, a serving of cooked rice is  cup or about the size of half a tennis ball or one cupcake wrapper. Knowing serving sizes will help you be aware of how much food you are eating at restaurants. The list below tells you how big or small some common portion sizes are based on everyday objects. 1 oz--4 stacked dice. 3 oz--1 deck of cards. 1 tsp--1 dice. 1 Tbsp-- a Ping-Pong ball. 2 Tbsp--1  Ping-Pong ball.  cup--1 tennis ball or 1  cupcake wrapper. 1 cup--1 baseball. Document Released: 09/15/2005 Document Revised: 01/30/2014 Document Reviewed: 07/21/2013 [facility] Patient Information 2015 [employer], [place]. This information is not intended to replace advice given to you by your health care provider. Make sure you discuss any questions you have with your health care provider.

## 2014-09-01 NOTE — Progress Notes (Addendum)
Subjective:   This chart was scribed for Elizabeth MochaEva N Jeralynn Vaquera, MD by Milly JakobJohn Lee Graves, ED Scribe. The patient was seen in room 25. Patient's care was started at 3:45 PM.  Patient ID: Elizabeth Lin, female    DOB: 07-06-1946, 68 y.o.   MRN: [id number]   Chief Complaint  Patient presents with  . Pelvic Pain    left lower quadrant painful/ states similar to ovarian pain in the past  . Weight Gain    wants to know if you can reccomend a program for weight loss  . Hypothyroidism    would like labs today for thyroid  . Cerumen Impaction    wants ears flushed    HPI HPI Comments: Elizabeth BlakesCarol R Lin is a 68 y.o. female who presents to the [facility] for a thyroid test and pelvic pain. She is seen by Dr. Rana SnareLowe as her gynecologist. Her last pap smear was in 2012 and was normal. She has a history of a right sided oophorectomy to remove an ovarian cyst.   She reports intermittent, aching, left sided, pelvic pain. She reports being prescribed a stool softener for 10 days the last time she experienced this pain. She states that she has soft bowel movements daily. She denies pain upon bowel movement or urination. She denies pain upon eating. She denies vaginal discharge. She reports a spot of blood one day, but she did not follow up on this. She agrees to an ultrasound for this.   She reports a part time job and states that she eats more food than she needs to which is causing her weight gain. She went to [employer] for a while and it helped when she was doing 30 minutes of exercise. [facility] to go to the pool, but she has not been going as much.    Past Medical History  Diagnosis Date  . Allergy   . Arthritis   . Depression   . Thyroid disease   . Anxiety   . Hammer toe    Current Outpatient Prescriptions on File Prior to Visit  Medication Sig Dispense Refill  . azithromycin (ZITHROMAX) 250 MG tablet Two tablets daily x 5 days 10 tablet 0  . Calcium-Vitamin  D-Vitamin K (CALCIUM + D + K PO) Take 1,500 mg by mouth daily. 1500 mg+D1000 IU    . DULoxetine (CYMBALTA) 20 MG capsule Take 1 capsule (20 mg total) by mouth daily. 90 capsule 3  . fish oil-omega-3 fatty acids 1000 MG capsule Take 2 g by mouth daily.    . fluticasone (FLONASE) 50 MCG/ACT nasal spray Place 2 sprays into the nose as needed.     . hydrochlorothiazide (HYDRODIURIL) 25 MG tablet Take 1 tablet (25 mg total) by mouth daily. 30 tablet 1  . HYDROcodone-homatropine (HYCODAN) 5-1.5 MG/5ML syrup Take 5 mLs by mouth every 6 (six) hours as needed for cough. 120 mL 0  . ipratropium (ATROVENT) 0.03 % nasal spray Place 2 sprays into the nose 2 (two) times daily. 30 mL 0  . levothyroxine (SYNTHROID, LEVOTHROID) 150 MCG tablet TAKE 1 TABLET BY MOUTH EVERY DAY BEFORE BREAKFAST 90 tablet 0  . Multiple Vitamins-Minerals (MULTIVITAMIN WITH MINERALS) tablet Take 1 tablet by mouth daily.    Marland Kitchen. OVER THE COUNTER MEDICATION OTC Lutigold extra taking one daily    . OVER THE COUNTER MEDICATION OTC Glucosamine Chondroitin taking    . predniSONE (DELTASONE) 20 MG tablet Two tablets daily x  5 days then one tablet daily x 5 days 15 tablet 0   No current facility-administered medications on file prior to visit.   Allergies  Allergen Reactions  . Amoxicillin-Pot Clavulanate     Red rash    Review of Systems  Constitutional: Negative for fever, chills, appetite change and fatigue.  Gastrointestinal: Negative for diarrhea and constipation.  Genitourinary: Positive for vaginal bleeding.    BP 130/80 mmHg  Pulse 80  Temp(Src) 98.4 F (36.9 C)  Resp 18  Wt 211 lb (95.709 kg)  SpO2 98%  Objective:  Physical Exam  Constitutional: She is oriented to person, place, and time. She appears well-developed and well-nourished. No distress.  HENT:  Head: Normocephalic and atraumatic.  Eyes: Conjunctivae and EOM are normal.  Neck: Neck supple. No tracheal deviation present. No thyroid mass and no thyromegaly  present.  Cardiovascular: Normal rate, regular rhythm, S1 normal, S2 normal and normal heart sounds.   No murmur heard. Pulmonary/Chest: Effort normal and breath sounds normal. No respiratory distress. She has no wheezes. She has no rales.  Abdominal: There is no CVA tenderness.  Musculoskeletal: Normal range of motion.  Lymphadenopathy:    She has no cervical adenopathy.  Neurological: She is alert and oriented to person, place, and time.  Skin: Skin is warm and dry.  Psychiatric: She has a normal mood and affect. Her behavior is normal.  Nursing note and vitals reviewed.   Assessment & Plan:  Hypothyroidism, unspecified hypothyroidism type - Plan: TSH, T3, Free, T4, Free - labs normal, cont levothyroxine - stable for years  Abdominal pain, left lower quadrant - Plan: IFOBT POC (occult bld, rslt in office), POCT glucose (manual entry), Comprehensive metabolic panel, Vitamin D, 25-hydroxy, US Pelvis Complete, US Transvaginal Non-OB cymbalta refilled  Uterine tenderness - Plan: US Pelvis Complete, US Transvaginal Non-OB  Vaginal spotting  Weight gain - Plan: Vitamin D, 25-hydroxy, Amb ref to Medical Nutrition Therapy-MNT, POCT glycosylated hemoglobin (Hb A1C) - pt wanting help with diet so recommended Dr. Wyona Almas and her "weigh to wellness" classes offered through [facility]. Pt will restart [employer] which worked well for [employer].  Recommend trial of a low carb diet  Cerumen impaction, bilateral - attempted warm water lavage bilaterally with partial success - entirety of bilateral impaction then removed by curette by myself  Vitamin D def - high dose x 3 mos, then start otc qd 1000-2000u and recheck in 6 mos.   I personally performed the services described in this documentation, which was scribed in my presence. The recorded information has been reviewed and considered, and addended by me as needed.  Norberto Sorenson, MD MPH   Results for orders placed or performed in visit on  09/01/14  Comprehensive metabolic panel  Result Value Ref Range   Sodium 138 135 - 145 mEq/L   Potassium 4.1 3.5 - 5.3 mEq/L   Chloride 103 96 - 112 mEq/L   CO2 27 19 - 32 mEq/L   Glucose, Bld 81 70 - 99 mg/dL   BUN 16 6 - 23 mg/dL   Creat 1.61 0.96 - 0.45 mg/dL   Total Bilirubin 0.4 0.2 - 1.2 mg/dL   Alkaline Phosphatase 78 39 - 117 U/L   AST 33 0 - 37 U/L   ALT 35 0 - 35 U/L   Total Protein 6.8 6.0 - 8.3 g/dL   Albumin 4.0 3.5 - 5.2 g/dL   Calcium 9.3 8.4 - 40.9 mg/dL  TSH  Result Value  Ref Range   TSH 0.731 0.350 - 4.500 uIU/mL  T3, Free  Result Value Ref Range   T3, Free 2.8 2.3 - 4.2 pg/mL  T4, Free  Result Value Ref Range   Free T4 1.52 0.80 - 1.80 ng/dL  Vitamin D, 96-EAVWUJW25-hydroxy  Result Value Ref Range   Vit D, 25-Hydroxy 23 (L) 30 - 100 ng/mL  IFOBT POC (occult bld, rslt in office)  Result Value Ref Range   IFOBT Negative   POCT glucose (manual entry)  Result Value Ref Range   POC Glucose 89 70 - 99 mg/dl  POCT glycosylated hemoglobin (Hb A1C)  Result Value Ref Range   Hemoglobin A1C 5.3

## 2014-09-02 LAB — T4, FREE: FREE T4: 1.52 ng/dL (ref 0.80–1.80)

## 2014-09-02 LAB — TSH: TSH: 0.731 u[IU]/mL (ref 0.350–4.500)

## 2014-09-02 LAB — T3, FREE: T3, Free: 2.8 pg/mL (ref 2.3–4.2)

## 2014-09-02 LAB — VITAMIN D 25 HYDROXY (VIT D DEFICIENCY, FRACTURES): Vit D, 25-Hydroxy: 23 ng/mL — ABNORMAL LOW (ref 30–100)

## 2014-09-06 ENCOUNTER — Encounter: Payer: Self-pay | Admitting: Family Medicine

## 2014-09-06 DIAGNOSIS — E559 Vitamin D deficiency, unspecified: Secondary | ICD-10-CM | POA: Insufficient documentation

## 2014-09-06 MED ORDER — ERGOCALCIFEROL 1.25 MG (50000 UT) PO CAPS: 50000.0000 [IU] | ORAL_CAPSULE | ORAL | Status: DC

## 2014-09-06 MED ORDER — LEVOTHYROXINE SODIUM 150 MCG PO TABS: ORAL_TABLET | ORAL | Status: DC

## 2014-09-06 MED ORDER — DULOXETINE HCL 20 MG PO CPEP: 20.0000 mg | ORAL_CAPSULE | Freq: Every day | ORAL | Status: DC

## 2014-09-08 ENCOUNTER — Ambulatory Visit
Admission: RE | Admit: 2014-09-08 | Discharge: 2014-09-08 | Disposition: A | Discharge status: Home / Self Care | Payer: 59 | Source: Ambulatory Visit | Attending: Family Medicine | Admitting: Family Medicine

## 2014-09-08 ENCOUNTER — Other Ambulatory Visit: Payer: Self-pay | Admitting: Radiology

## 2014-09-08 ENCOUNTER — Telehealth: Payer: Self-pay | Admitting: Radiology

## 2014-09-08 DIAGNOSIS — R1032 Left lower quadrant pain: Secondary | ICD-10-CM

## 2014-09-08 DIAGNOSIS — N949 Unspecified condition associated with female genital organs and menstrual cycle: Secondary | ICD-10-CM

## 2014-09-08 NOTE — Telephone Encounter (Signed)
I have called patient regarding her need for CT scan, per Gyn CT abd/ pelvis with and without. The first available at [facility] is Monday Dec 14th at 2pm, patient is advised, and voiced concern regarding scheduling. She states she is going to call to reschedule the appt, possibly for Wednesday. Patient has also asked for cost of the scan. I have encouraged her to call her benefits number on the back of her insurance card to determine her benefits for special imaging. I am concerned she wants to wait for the scan, and have advised Dr Clelia CroftShaw

## 2014-09-08 NOTE — Telephone Encounter (Signed)
Called pt to discuss results.  She has scheduled the CT for Tuesday but is worried about the cost and also worried as she has never had IV contrast and her mother had a contrast allergy.  Pt advised that she should call her gynecologist Dr. Rana SnareLowe at 8:30 Monday to get an appt w/ him that afternoon so he can review the US and advise on timing of CT. Sxs have been going on for months and are unchanged so likely no significant problem with waiting a short time but unsure as US was unable to visualize very much.

## 2014-09-10 NOTE — Telephone Encounter (Signed)
Please call pt's gynecologist's office Dr. Rana SnareLowe and see if pt can be worked in this afternoon Mon 12/14 - she was evaluated for LLQ pelvic pain and on the US on Friday no blood flow to her left ovary was seen. Discussed with the physician on call for Dr. Rana SnareLowe who agreed that there was no surgical emergency but she should be evaluated asap as will likely need surgery if further imaging confirms.  Pt can't afford a CT so it is scheduled for 12/15 but pt would like to be evaluated by Dr. Rana SnareLowe prior to see if it can be deferred at all until she can afford it.

## 2014-09-11 ENCOUNTER — Other Ambulatory Visit: Payer: 59

## 2014-09-11 NOTE — Telephone Encounter (Signed)
Called Dr. Raynaldo OpitzLowes office [phone]

## 2014-09-11 NOTE — Telephone Encounter (Signed)
LM for rtn call. 

## 2014-09-11 NOTE — Telephone Encounter (Signed)
Appt made for today with Dr. Rana SnareLowe at [facility] Called pt and LM for rtn call to confirm message/ appt Faxed OV and US results to [phone]

## 2014-09-12 ENCOUNTER — Ambulatory Visit
Admission: RE | Admit: 2014-09-12 | Discharge: 2014-09-12 | Disposition: A | Discharge status: Home / Self Care | Payer: 59 | Source: Ambulatory Visit | Attending: Family Medicine | Admitting: Family Medicine

## 2014-09-12 ENCOUNTER — Other Ambulatory Visit: Payer: Self-pay | Admitting: *Deleted

## 2014-09-12 ENCOUNTER — Other Ambulatory Visit: Payer: Self-pay | Admitting: Family Medicine

## 2014-09-12 DIAGNOSIS — R1032 Left lower quadrant pain: Secondary | ICD-10-CM

## 2014-09-12 NOTE — Telephone Encounter (Signed)
Patient saw Dr. Candice Campavid Lowe yesterday and is not sure if she still needs to keep her appointment for a CSCAN  Please call and advise  [phone]

## 2014-09-12 NOTE — Telephone Encounter (Signed)
LM for pt to rtn call    Called Dr. Raynaldo OpitzLowes office to find out if Dr. Rana SnareLowe has recommended pt go for CT Scan still.

## 2014-09-13 ENCOUNTER — Other Ambulatory Visit: Payer: Self-pay | Admitting: Family Medicine

## 2014-09-13 DIAGNOSIS — E2839 Other primary ovarian failure: Secondary | ICD-10-CM

## 2014-09-15 ENCOUNTER — Telehealth: Payer: Self-pay

## 2014-09-15 NOTE — Telephone Encounter (Signed)
That's fine.  There is no order attached to the note so not sure what I am supposed to sign but happy to do so.

## 2014-09-15 NOTE — Telephone Encounter (Signed)
[facility] for [facility] density and needs Dr  Clelia CroftShaw to sign

## 2014-09-18 NOTE — Telephone Encounter (Signed)
They have signed the order- cosign is required- the order can be found in Cosign-clinic orders to complete.

## 2014-09-26 ENCOUNTER — Ambulatory Visit
Admission: RE | Admit: 2014-09-26 | Discharge: 2014-09-26 | Disposition: A | Discharge status: Home / Self Care | Payer: 59 | Source: Ambulatory Visit | Attending: Family Medicine | Admitting: Family Medicine

## 2014-09-26 DIAGNOSIS — E2839 Other primary ovarian failure: Secondary | ICD-10-CM

## 2014-09-27 ENCOUNTER — Encounter: Payer: Self-pay | Admitting: Family Medicine

## 2014-11-11 ENCOUNTER — Other Ambulatory Visit: Payer: Self-pay | Admitting: Physician Assistant

## 2015-01-12 ENCOUNTER — Ambulatory Visit (INDEPENDENT_AMBULATORY_CARE_PROVIDER_SITE_OTHER): Payer: Medicare Other | Admitting: Family Medicine

## 2015-01-12 ENCOUNTER — Encounter: Payer: Self-pay | Admitting: Family Medicine

## 2015-01-12 VITALS — BP 160/70 | HR 54 | Temp 98.0°F | Resp 16 | Ht 65.5 in | Wt 202.2 lb

## 2015-01-12 DIAGNOSIS — E669 Obesity, unspecified: Secondary | ICD-10-CM

## 2015-01-12 DIAGNOSIS — F329 Major depressive disorder, single episode, unspecified: Secondary | ICD-10-CM

## 2015-01-12 DIAGNOSIS — M1990 Unspecified osteoarthritis, unspecified site: Secondary | ICD-10-CM | POA: Diagnosis not present

## 2015-01-12 DIAGNOSIS — E039 Hypothyroidism, unspecified: Secondary | ICD-10-CM | POA: Diagnosis not present

## 2015-01-12 DIAGNOSIS — M8580 Other specified disorders of bone density and structure, unspecified site: Secondary | ICD-10-CM

## 2015-01-12 DIAGNOSIS — Z23 Encounter for immunization: Secondary | ICD-10-CM | POA: Diagnosis not present

## 2015-01-12 DIAGNOSIS — H6981 Other specified disorders of Eustachian tube, right ear: Secondary | ICD-10-CM | POA: Diagnosis not present

## 2015-01-12 DIAGNOSIS — F32A Depression, unspecified: Secondary | ICD-10-CM

## 2015-01-12 DIAGNOSIS — Z79899 Other long term (current) drug therapy: Secondary | ICD-10-CM

## 2015-01-12 DIAGNOSIS — R5383 Other fatigue: Secondary | ICD-10-CM | POA: Diagnosis not present

## 2015-01-12 DIAGNOSIS — E559 Vitamin D deficiency, unspecified: Secondary | ICD-10-CM | POA: Diagnosis not present

## 2015-01-12 DIAGNOSIS — Z6833 Body mass index (BMI) 33.0-33.9, adult: Secondary | ICD-10-CM | POA: Diagnosis not present

## 2015-01-12 LAB — CBC
HCT: 40.8 % (ref 36.0–46.0)
Hemoglobin: 13.8 g/dL (ref 12.0–15.0)
MCH: 28.3 pg (ref 26.0–34.0)
MCHC: 33.8 g/dL (ref 30.0–36.0)
MCV: 83.8 fL (ref 78.0–100.0)
MPV: 9.4 fL (ref 8.6–12.4)
PLATELETS: 298 10*3/uL (ref 150–400)
RBC: 4.87 MIL/uL (ref 3.87–5.11)
RDW: 14.6 % (ref 11.5–15.5)
WBC: 5.5 10*3/uL (ref 4.0–10.5)

## 2015-01-12 MED ORDER — ZOSTER VACCINE LIVE 19400 UNT/0.65ML ~~LOC~~ SOLR: 0.6500 mL | Freq: Once | SUBCUTANEOUS | Status: DC

## 2015-01-12 MED ORDER — DULOXETINE HCL 30 MG PO CPEP: 30.0000 mg | ORAL_CAPSULE | Freq: Every day | ORAL | Status: DC

## 2015-01-12 NOTE — Patient Instructions (Signed)
You need to get your shingles vaccine and you are also do for a pneumoax.  Barotitis Media Barotitis media is inflammation of your middle ear. This occurs when the auditory tube (eustachian tube) leading from the back of your nose (nasopharynx) to your eardrum is blocked. This blockage may result from a cold, environmental allergies, or an upper respiratory infection. Unresolved barotitis media may lead to damage or hearing loss (barotrauma), which may become permanent. HOME CARE INSTRUCTIONS   Use medicines as recommended by your health care provider. Over-the-counter medicines will help unblock the canal and can help during times of air travel.  Do not put anything into your ears to clean or unplug them. Eardrops will not be helpful.  Do not swim, dive, or fly until your health care provider says it is all right to do so. If these activities are necessary, chewing gum with frequent, forceful swallowing may help. It is also helpful to hold your nose and gently blow to pop your ears for equalizing pressure changes. This forces air into the eustachian tube.  Only take over-the-counter or prescription medicines for pain, discomfort, or fever as directed by your health care provider.  A decongestant may be helpful in decongesting the middle ear and make pressure equalization easier. SEEK MEDICAL CARE IF:  You experience a serious form of dizziness in which you feel as if the room is spinning and you feel nauseated (vertigo).  Your symptoms only involve one ear. SEEK IMMEDIATE MEDICAL CARE IF:   You develop a severe headache, dizziness, or severe ear pain.  You have bloody or pus-like drainage from your ears.  You develop a fever.  Your problems do not improve or become worse. MAKE SURE YOU:   Understand these instructions.  Will watch your condition.  Will get help right away if you are not doing well or get worse. Document Released: 09/12/2000 Document Revised: 07/06/2013 Document  Reviewed: 04/12/2013 [facility] Patient Information 2015 [place], [place]. This information is not intended to replace advice given to you by your health care provider. Make sure you discuss any questions you have with your health care provider.  Exercise to Lose Weight Exercise and a healthy diet may help you lose weight. Your doctor may suggest specific exercises. EXERCISE IDEAS AND TIPS  Choose low-cost things you enjoy doing, such as walking, bicycling, or exercising to workout videos.  Take stairs instead of the elevator.  Walk during your lunch break.  Park your car further away from work or school.  Go to a gym or an exercise class.  Start with 5 to 10 minutes of exercise each day. Build up to 30 minutes of exercise 4 to 6 days a week.  Wear shoes with good support and comfortable clothes.  Stretch before and after working out.  Work out until you breathe harder and your heart beats faster.  Drink extra water when you exercise.  Do not do so much that you hurt yourself, feel dizzy, or get very short of breath. Exercises that burn about 150 calories:  Running 1  miles in 15 minutes.  Playing volleyball for 45 to 60 minutes.  Washing and waxing a car for 45 to 60 minutes.  Playing touch football for 45 minutes.  Walking 1  miles in 35 minutes.  Pushing a stroller 1  miles in 30 minutes.  Playing basketball for 30 minutes.  Raking leaves for 30 minutes.  Bicycling 5 miles in 30 minutes.  Walking 2 miles in 30 minutes.  Dancing for 30 minutes.  Shoveling snow for 15 minutes.  Swimming laps for 20 minutes.  Walking up stairs for 15 minutes.  Bicycling 4 miles in 15 minutes.  Gardening for 30 to 45 minutes.  Jumping rope for 15 minutes.  Washing windows or floors for 45 to 60 minutes. Document Released: 10/18/2010 Document Revised: 12/08/2011 Document Reviewed: 10/18/2010 [facility] Patient Information 2015 [place]. This information is not  intended to replace advice given to you by your health care provider. Make sure you discuss any questions you have with your health care provider.

## 2015-01-12 NOTE — Progress Notes (Signed)
Subjective:    Patient ID: Elizabeth Lin, female    DOB: 10-18-1945, 69 y.o.   MRN: [id number] This chart was scribed for Norberto Sorenson, MD by Littie Deeds, Medical Scribe. This patient was seen in Room 25 and the patient's care was started at 1:29 PM.  Chief Complaint  Patient presents with  . Follow-up    vitamin D and discuss depression medication     HPI HPI Comments: Elizabeth Lin is a 69 y.o. female with a hx of hypothyroid, vitamin D deficiency, and depression who presents to the [facility] for a follow-up.  She was last here 4 months ago. Labs were normal, including thyroid. She has been maintained by 150 mcg levothyroxine for several years. She was encouraged to start vitamin D supplements. She has been on Cymbalta 20 mg for several years for depression. Patient had planned to start weight loss classes at [facility]. She was 210 lbs 4 months ago. She had some LLQ abdominal pain as well as spotting, so was sent for pelvic US where the [place] tech called for concern that they could not visualize blood flow. This was discussed with Dr. Rana Snare, who recommended CT scan and close follow-up due to concern for surgical emergency. Fortunately, CT scan was normal and no further evaluation was needed. She did have a bone density scan done since her last visit that was consistent with very mild osteopenia, t-score -1.1. She is due for her shingles vaccine, pneumonia vaccine, colonoscopy, and an annual wellness visit. She had a colonoscopy done in 2005.  Health Maintenance: Patient has not been checking her blood pressure recently. She has been working on losing weight and improving her diet, eating more vegetables. She has been taking vitamin D 2000 mg and calcium supplements. Patient has not had her shingles vaccine. She states she has had a colonoscopy done more recently than the 2005 colonoscopy in the records.  Depression: She is on Cymbalta 20 mg currently. In the past, she had been  on Cymbalta 40 mg which did give her headaches. She would like to try a 30 mg dose of it. When she is off of Cymbalta, she notes that she will cry often. She had been tried on Wellbutrin which she did not do well on.   Right Ear Pressure: Patient states she has been having intermittent right ear pressure for the past few years. She states she has negative ear pressure in the ear, which is uncomfortable and makes her feel as if "she is on an airplane." She reports having associated decreased hearing from the right ear. Patient did see an ENT specialist for this, who suggested that she could get an ear perforation procedure done; she has not had this procedure done yet. She has been using the Flonase nasal spray.  Acid Reflux/Indigestion: Patient states she does get heartburn symptoms sometimes. She does not take any medications for this.  Past Medical History  Diagnosis Date  . Allergy   . Arthritis   . Depression   . Thyroid disease   . Anxiety   . Hammer toe    Current Outpatient Prescriptions on File Prior to Visit  Medication Sig Dispense Refill  . Calcium-Vitamin D-Vitamin K (CALCIUM + D + K PO) Take 1,500 mg by mouth daily. 1500 mg+D1000 IU    . fish oil-omega-3 fatty acids 1000 MG capsule Take 2 g by mouth daily.    Marland Kitchen levothyroxine (SYNTHROID, LEVOTHROID) 150 MCG tablet TAKE 1 TABLET  BY MOUTH EVERY DAY BEFORE BREAKFAST 90 tablet 3  . Multiple Vitamins-Minerals (MULTIVITAMIN WITH MINERALS) tablet Take 1 tablet by mouth daily.    Marland Kitchen. OVER THE COUNTER MEDICATION OTC Lutigold extra taking one daily    . OVER THE COUNTER MEDICATION OTC Glucosamine Chondroitin taking    . ergocalciferol (VITAMIN D2) 50000 UNITS capsule Take 1 capsule (50,000 Units total) by mouth once a week. (Patient not taking: Reported on 01/12/2015) 12 capsule 0   No current facility-administered medications on file prior to visit.   Allergies  Allergen Reactions  . Amoxicillin-Pot Clavulanate     Red rash      Review of Systems  Constitutional: Positive for fever, activity change and unexpected weight change. Negative for chills, diaphoresis, appetite change and fatigue.  HENT: Positive for ear pain and hearing loss. Negative for congestion, ear discharge, facial swelling, postnasal drip, rhinorrhea and sinus pressure.   Cardiovascular: Negative for chest pain.  Gastrointestinal: Positive for abdominal pain. Negative for nausea and vomiting.  Musculoskeletal: Positive for back pain and arthralgias.  Psychiatric/Behavioral: Positive for dysphoric mood. Negative for behavioral problems, confusion, sleep disturbance, decreased concentration and agitation. The patient is not nervous/anxious and is not hyperactive.        Objective:  BP 160/70 mmHg  Pulse 54  Temp(Src) 98 F (36.7 C) (Oral)  Resp 16  Ht 5' 5.5" (1.664 m)  Wt 202 lb 3.2 oz (91.717 kg)  BMI 33.12 kg/m2  SpO2 100%  Physical Exam  Constitutional: She is oriented to person, place, and time. She appears well-developed and well-nourished. No distress.  HENT:  Head: Normocephalic and atraumatic.  Right Ear: Tympanic membrane is retracted.  Mouth/Throat: Oropharynx is clear and moist. No oropharyngeal exudate.  Eyes: Pupils are equal, round, and reactive to light.  Neck: Neck supple.  Cardiovascular: Normal rate and regular rhythm.   Murmur heard.  Systolic murmur is present with a grade of 3/6  3/6 systolic murmur, left upper sternal border.  Pulmonary/Chest: Effort normal and breath sounds normal. No respiratory distress. She has no wheezes. She has no rales.  Moving air well.  Abdominal: Bowel sounds are increased.  Bowel sounds heard in chest.   Musculoskeletal: She exhibits no edema.  Neurological: She is alert and oriented to person, place, and time. No cranial nerve deficit.  Skin: Skin is warm and dry. No rash noted.  Psychiatric: She has a normal mood and affect. Her behavior is normal.  Nursing note and vitals  reviewed.   Filed Weights   01/12/15 1311  Weight: 202 lb 3.2 oz (91.717 kg)         Assessment & Plan:  Increase Cymbalta from 20 mg to 30 mg. May want to consider low dose of Wellbutrin. Patient reports having tried many other anti-depressant medications and that [place] these. 1. Hypothyroidism, unspecified hypothyroidism type   2. Vitamin D deficiency   3. Eustachian tube dysfunction, right   4. Depression   5. Osteoarthritis, unspecified osteoarthritis type, unspecified site   6. Other fatigue   7. BMI 33.0-33.9,adult   8. Obesity   9. Osteopenia   10. Polypharmacy   11. Need for shingles vaccine     Orders Placed This Encounter  Procedures  . CBC  . TSH  . Comprehensive metabolic panel  . Vit D  25 hydroxy (rtn osteoporosis monitoring)  . Vitamin B12  . T4, Free    Meds ordered this encounter  Medications  . MULTIPLE VITAMIN PO    Sig: Take by mouth daily.  . DULoxetine (CYMBALTA) 30 MG capsule    Sig: Take 1 capsule (30 mg total) by mouth daily.    Dispense:  90 capsule    Refill:  1  . zoster vaccine live, PF, (ZOSTAVAX) 16109 UNT/0.65ML injection    Sig: Inject 19,400 Units into the skin once.    Dispense:  1 each    Refill:  0    I personally performed the services described in this documentation, which was scribed in my presence. The recorded information has been reviewed and considered, and addended by me as needed.  Norberto Sorenson, MD MPH

## 2015-01-13 LAB — COMPREHENSIVE METABOLIC PANEL
ALBUMIN: 4.2 g/dL (ref 3.5–5.2)
ALT: 16 U/L (ref 0–35)
AST: 20 U/L (ref 0–37)
Alkaline Phosphatase: 66 U/L (ref 39–117)
BUN: 18 mg/dL (ref 6–23)
CHLORIDE: 101 meq/L (ref 96–112)
CO2: 28 mEq/L (ref 19–32)
Calcium: 9.6 mg/dL (ref 8.4–10.5)
Creat: 0.68 mg/dL (ref 0.50–1.10)
Glucose, Bld: 84 mg/dL (ref 70–99)
POTASSIUM: 4.3 meq/L (ref 3.5–5.3)
Sodium: 139 mEq/L (ref 135–145)
TOTAL PROTEIN: 6.9 g/dL (ref 6.0–8.3)
Total Bilirubin: 0.6 mg/dL (ref 0.2–1.2)

## 2015-01-13 LAB — T4, FREE: Free T4: 1.6 ng/dL (ref 0.80–1.80)

## 2015-01-13 LAB — VITAMIN D 25 HYDROXY (VIT D DEFICIENCY, FRACTURES): Vit D, 25-Hydroxy: 36 ng/mL (ref 30–100)

## 2015-01-13 LAB — TSH: TSH: 0.634 u[IU]/mL (ref 0.350–4.500)

## 2015-01-13 LAB — VITAMIN B12: Vitamin B-12: 881 pg/mL (ref 211–911)

## 2015-01-15 ENCOUNTER — Encounter: Payer: Self-pay | Admitting: Family Medicine

## 2015-03-26 ENCOUNTER — Ambulatory Visit (INDEPENDENT_AMBULATORY_CARE_PROVIDER_SITE_OTHER): Payer: Medicare Other | Admitting: Family Medicine

## 2015-03-26 VITALS — BP 122/74 | HR 58 | Temp 98.2°F | Resp 17 | Ht 66.5 in | Wt 204.0 lb

## 2015-03-26 DIAGNOSIS — L03011 Cellulitis of right finger: Secondary | ICD-10-CM

## 2015-03-26 DIAGNOSIS — F329 Major depressive disorder, single episode, unspecified: Secondary | ICD-10-CM

## 2015-03-26 DIAGNOSIS — F32A Depression, unspecified: Secondary | ICD-10-CM

## 2015-03-26 MED ORDER — CEPHALEXIN 500 MG PO CAPS: 500.0000 mg | ORAL_CAPSULE | Freq: Four times a day (QID) | ORAL | Status: DC

## 2015-03-26 MED ORDER — DULOXETINE HCL 30 MG PO CPEP: 30.0000 mg | ORAL_CAPSULE | Freq: Every day | ORAL | Status: DC

## 2015-03-26 NOTE — Progress Notes (Signed)
Procedure:  Consent obtained.  MC block with 1% lido.  Betadine prep.  #11 blade used to make a hockey puck incision into the nail fold.  No purulence expressed.  Drsg placed.

## 2015-03-26 NOTE — Progress Notes (Signed)
Chief Complaint:  Chief Complaint  Patient presents with  . Finger Injury  . Depression    HPI: Elizabeth Lin is a 69 y.o. female who is here for  Saturday 4 th finger infection after peeling off cuticle, and sometimes she pushes it back with a rubber or metal pusher, She denies fevers or chills. Never happened before.She has worked in the yard but has not worked with Psychiatric nurseroses. She is UTD on her tetanus, she has had it int he last 10 years. She denies diabetes.  She was on Cymbalta 20 mg and is now currently on 30 mg. She feels better. Her concentration is not as good and sleepign too much and she is not occupying her time well. She is not reading for prolong periods of time. None of this is new. She has ahs psych testing and the recall was delayed but she has known this in 2011.  In the past, she had been on Cymbalta 40 mg which did give her headaches. She would like to try a 30 mg dose of it. When she is off of Cymbalta, she notes that she will cry often. She had been tried on Wellbutrin which she did not do well on. She denies any suicidal, homicidal ideation or hallucinations. She is retired and her days are nonstructured. He belongs to a church but does not have many close friends or group outings with the church.  Lab Results  Component Value Date   TSH 0.634 01/12/2015    Past Medical History  Diagnosis Date  . Allergy   . Arthritis   . Depression   . Thyroid disease   . Anxiety   . Hammer toe    Past Surgical History  Procedure Laterality Date  . Appendectomy    . Tonsillectomy    . Oophorectomy Right   . Cystectomy      eye   History   Social History  . Marital Status: Single    Spouse Name: N/A  . Number of Children: 3  . Years of Education: college   Occupational History  . 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 Smoking status: Former Smoker    Types: Cigarettes    Quit date: 11/01/1974  . Smokeless tobacco: Former  NeurosurgeonUser    Quit date: 06/11/1982  . Alcohol Use: No     Comment: occasional wine  . Drug Use: No  . Sexual Activity: No   Other Topics Concern  . None   Social History Narrative   Patient is Divorced and lives with grandson.   Patient's  Education: [employer].    Patient is right handed   Caffeine consumption 2-3 daily         Family History  Problem Relation Age of Onset  . Diabetes Father   . Myasthenia gravis Father   . Heart disease Father   . Thyroid disease Mother   . Cancer Mother   . Depression Mother   . Hypertension Brother   . Thyroid disease Daughter   . Depression Son   . Heart disease Paternal Grandmother   . Heart disease Paternal Grandfather    Allergies  Allergen Reactions  . Amoxicillin-Pot Clavulanate     Red rash   Prior to Admission medications   Medication Sig Start Date End Date Taking? Authorizing Provider  Calcium-Vitamin D-Vitamin K (CALCIUM + D + K PO) Take 1,500 mg by mouth daily. 1500 mg+D1000  IU   Yes Historical Provider, MD  DULoxetine (CYMBALTA) 30 MG capsule Take 1 capsule (30 mg total) by mouth daily. 01/12/15  Yes Sherren Mocha, MD  ergocalciferol (VITAMIN D2) 50000 UNITS capsule Take 1 capsule (50,000 Units total) by mouth once a week. 09/06/14  Yes Sherren Mocha, MD  fish oil-omega-3 fatty acids 1000 MG capsule Take 2 g by mouth daily.   Yes Historical Provider, MD  levothyroxine (SYNTHROID, LEVOTHROID) 150 MCG tablet TAKE 1 TABLET BY MOUTH EVERY DAY BEFORE BREAKFAST 09/06/14  Yes Sherren Mocha, MD  Multiple Vitamins-Minerals (MULTIVITAMIN WITH MINERALS) tablet Take 1 tablet by mouth daily.   Yes Historical Provider, MD  OVER THE COUNTER MEDICATION OTC Lutigold extra taking one daily   Yes Historical Provider, MD  OVER THE COUNTER MEDICATION OTC Glucosamine Chondroitin taking   Yes Historical Provider, MD  zoster vaccine live, PF, (ZOSTAVAX) 16109 UNT/0.65ML injection Inject 19,400 Units into the skin once. Patient not taking: Reported on 03/26/2015  01/12/15   Sherren Mocha, MD     ROS: The patient denies fevers, chills, night sweats, unintentional weight loss, chest pain, palpitations, wheezing, dyspnea on exertion, nausea, vomiting, abdominal pain, dysuria, hematuria, melena, numbness, weakness, or tingling.  All other systems have been reviewed and were otherwise negative with the exception of those mentioned in the HPI and as above.    PHYSICAL EXAM: Filed Vitals:   03/26/15 0815  BP: 122/74  Pulse: 58  Temp: 98.2 F (36.8 C)  Resp: 17   Filed Vitals:   03/26/15 0815  Height: 5' 6.5" (1.689 m)  Weight: 204 lb (92.534 kg)   Body mass index is 32.44 kg/(m^2).   General: Alert, no acute distress HEENT:  Normocephalic, atraumatic, oropharynx patent. EOMI, PERRLA Cardiovascular:  Regular rate and rhythm, no rubs murmurs or gallops.  No Carotid bruits, radial pulse intact. No pedal edema.  Respiratory: Clear to auscultation bilaterally.  No wheezes, rales, or rhonchi.  No cyanosis, no use of accessory musculature GI: No organomegaly, abdomen is soft and non-tender, positive bowel sounds.  No masses. Skin: No rashes. Neurologic: Facial musculature symmetric. Psychiatric: Patient is appropriate throughout our interaction. Lymphatic: No cervical lymphadenopathy Musculoskeletal: Gait intact. + right 4th finger-erythematous, tender, + fluctuant.    LABS: Results for orders placed or performed in visit on 01/12/15  CBC  Result Value Ref Range   WBC 5.5 4.0 - 10.5 K/uL   RBC 4.87 3.87 - 5.11 MIL/uL   Hemoglobin 13.8 12.0 - 15.0 g/dL   HCT 60.4 54.0 - 98.1 %   MCV 83.8 78.0 - 100.0 fL   MCH 28.3 26.0 - 34.0 pg   MCHC 33.8 30.0 - 36.0 g/dL   RDW 19.1 47.8 - 29.5 %   Platelets 298 150 - 400 K/uL   MPV 9.4 8.6 - 12.4 fL  TSH  Result Value Ref Range   TSH 0.634 0.350 - 4.500 uIU/mL  Comprehensive metabolic panel  Result Value Ref Range   Sodium 139 135 - 145 mEq/L   Potassium 4.3 3.5 - 5.3 mEq/L   Chloride 101 96 - 112  mEq/L   CO2 28 19 - 32 mEq/L   Glucose, Bld 84 70 - 99 mg/dL   BUN 18 6 - 23 mg/dL   Creat 6.21 3.08 - 6.57 mg/dL   Total Bilirubin 0.6 0.2 - 1.2 mg/dL   Alkaline Phosphatase 66 39 - 117 U/L   AST 20 0 - 37 U/L   ALT 16 0 -  35 U/L   Total Protein 6.9 6.0 - 8.3 g/dL   Albumin 4.2 3.5 - 5.2 g/dL   Calcium 9.6 8.4 - 16.1 mg/dL  Vit D  25 hydroxy (rtn osteoporosis monitoring)  Result Value Ref Range   Vit D, 25-Hydroxy 36 30 - 100 ng/mL  Vitamin B12  Result Value Ref Range   Vitamin B-12 881 211 - 911 pg/mL  T4, Free  Result Value Ref Range   Free T4 1.60 0.80 - 1.80 ng/dL     EKG/XRAY:   Primary read interpreted by Dr. Conley Rolls at [facility].   ASSESSMENT/PLAN: Encounter Diagnoses  Name Primary?  . Paronychia, right Yes  . Depression    Consider wellbutrin adjunct if socialization techiniques do not improve. Part of the problem is that her day is not structured and she does not socialize. 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 Wound care as needed , fu as needed. Fu in 1-3 months  For depression symptoms  She can always call if sxs are worse for depression and we can do a trial of Wellbutrin.   Gross sideeffects, risk and benefits, and alternatives of medications d/w patient. Patient is aware that all medications have potential sideeffects and we are unable to predict every sideeffect or drug-drug interaction that may occur.  Hamilton Capri PHUONG, DO 03/26/2015 8:46 AM

## 2015-03-26 NOTE — Patient Instructions (Addendum)
Warm soaks to finger 3-4 times a day.  Antibiotics until they are gone.    Finger cots - to make your finger water proof in the pool  READING CONNECTIONS for volunteer opps   Paronychia Paronychia is an inflammatory reaction involving the folds of the skin surrounding the fingernail. This is commonly caused by an infection in the skin around a nail. The most common cause of paronychia is frequent wetting of the hands (as seen with bartenders, food servers, nurses or others who wet their hands). This makes the skin around the fingernail susceptible to infection by bacteria (germs) or fungus. Other predisposing factors are:  Aggressive manicuring.  Nail biting.  Thumb sucking. The most common cause is a staphylococcal (a type of germ) infection, or a fungal (Candida) infection. When caused by a germ, it usually comes on suddenly with redness, swelling, pus and is often painful. It may get under the nail and form an abscess (collection of pus), or form an abscess around the nail. If the nail itself is infected with a fungus, the treatment is usually prolonged and may require oral medicine for up to one year. Your caregiver will determine the length of time treatment is required. The paronychia caused by bacteria (germs) may largely be avoided by not pulling on hangnails or picking at cuticles. When the infection occurs at the tips of the finger it is called felon. When the cause of paronychia is from the herpes simplex virus (HSV) it is called herpetic whitlow. TREATMENT  When an abscess is present treatment is often incision and drainage. This means that the abscess must be cut open so the pus can get out. When this is done, the following home care instructions should be followed. HOME CARE INSTRUCTIONS   It is important to keep the affected fingers very dry. Rubber or plastic gloves over cotton gloves should be used whenever the hand must be placed in water.  Keep wound clean, dry and dressed as  suggested by your caregiver between warm soaks or warm compresses.  Soak in warm water for fifteen to twenty minutes three to four times per day for bacterial infections. Fungal infections are very difficult to treat, so often require treatment for long periods of time.  For bacterial (germ) infections take antibiotics (medicine which kill germs) as directed and finish the prescription, even if the problem appears to be solved before the medicine is gone.  Only take over-the-counter or prescription medicines for pain, discomfort, or fever as directed by your caregiver. SEEK IMMEDIATE MEDICAL CARE IF:  You have redness, swelling, or increasing pain in the wound.  You notice pus coming from the wound.  You have a fever.  You notice a bad smell coming from the wound or dressing. Document Released: 03/11/2001 Document Revised: 12/08/2011 Document Reviewed: 11/10/2008 [facility] Patient Information 2015 [place], [place]. This information is not intended to replace advice given to you by your health care provider. Make sure you discuss any questions you have with your health care provider.

## 2015-07-02 ENCOUNTER — Other Ambulatory Visit: Payer: Self-pay | Admitting: Family Medicine

## 2015-07-26 NOTE — Telephone Encounter (Signed)
Error

## 2015-09-20 ENCOUNTER — Ambulatory Visit (INDEPENDENT_AMBULATORY_CARE_PROVIDER_SITE_OTHER): Payer: Medicare Other | Admitting: Family Medicine

## 2015-09-20 VITALS — BP 140/92 | HR 72 | Temp 98.0°F | Resp 16

## 2015-09-20 DIAGNOSIS — Z7189 Other specified counseling: Secondary | ICD-10-CM

## 2015-09-20 DIAGNOSIS — Z7185 Encounter for immunization safety counseling: Secondary | ICD-10-CM

## 2015-09-20 NOTE — Patient Instructions (Signed)
Hepatitis B °Hepatitis B is a viral infection of the liver. There are two kinds of hepatitis B: °· Acute hepatitis B. Hepatitis B that lasts six months or less is called acute hepatitis B. °· Chronic hepatitis B. Hepatitis B that lasts more than six months is called long-term (chronic) hepatitis B. Chronic hepatitis B can lead to liver failure, scarring of the liver (cirrhosis), or liver cancer. °Acute hepatitis B can turn into chronic hepatitis B. Most adults with acute hepatitis B do not develop chronic hepatitis B. Infants and young children who get hepatitis B are more likely to develop chronic hepatitis B than adults who get hepatitis B. °CAUSES °Hepatitis B is caused by the hepatitis B virus (HBV). The virus is passed from one person to another through blood, birth, sex, or bodily fluids, such as: °· Breast milk. °· Tears. °· Saliva. °RISK FACTORS °The risk factors for hepatitis B include: °· Having unprotected sex with someone who is infected. °· Using drugs with needles. °SIGNS AND SYMPTOMS °Symptoms of hepatitis B may include: °· Loss of appetite. °· Fatigue. °· Nausea. °· Vomiting. °· Stomach pain. °· Dark yellow urine. °· Yellowish skin and eyes (jaundice). °Hepatitis B does not always cause symptoms. °DIAGNOSIS °Your health care provider will do a blood test to diagnose hepatitis B. °TREATMENT °You will need to prevent further injury to the liver by avoiding alcohol and medicines that can be hard for the liver to metabolize. Treatment for chronic hepatitis B may include antiviral medicine. This medicine may help: °· Lower your risk of liver failure. °· Lower your ability to infect others with hepatitis B. °· Lower your risk of scarring your liver (cirrhosis). °· Lower your risk of liver cancer. °HOME CARE INSTRUCTIONS  °· Rest as needed. °· Avoid alcohol. °· Take medicines only as directed by your health care provider. °· Do not take any medicine without approval from your health care provider. This  includes over-the-counter medicine that is usually taken for fever or pain. °· Do not have sex unless approved by your health care provider. °· Do not share toothbrushes, nail clippers, razors, or needles with others. °SEEK IMMEDIATE MEDICAL CARE IF:  °· You are unable to eat or drink. °· You have a fever along with nausea or vomiting. °· You feel confused. °· You develop jaundice or your chronic jaundice becomes more severe. °· You have trouble breathing. °· You develop a rash. °· Your skin, throat, mouth, or face becomes swollen. °· Your body shakes or twitches (seizure). °· You become very sleepy or have trouble waking up. °  °This information is not intended to replace advice given to you by your health care provider. Make sure you discuss any questions you have with your health care provider. °  °Document Released: 09/12/2000 Document Revised: 06/06/2015 Document Reviewed: 12/23/2013 °Elsevier Interactive Patient Education ©2016 Elsevier Inc. ° °

## 2015-09-20 NOTE — Progress Notes (Signed)
By signing my name below, I, Stann Oresung-Kai Tsai, attest that this documentation has been prepared under the direction and in the presence of Elvina SidleKurt Hiram Mciver, MD. Electronically Signed: Stann Oresung-Kai Tsai, Scribe. 09/20/2015 , 6:37 PM .  Patient was seen in room 8.   Patient ID: Elizabeth Lin MRN: [id number], DOB: 1946/06/29, 69 y.o. Date of Encounter: 09/20/2015  Primary Physician: Norberto SorensonSHAW,EVA, MD  Chief Complaint:  Chief Complaint  Patient presents with  . Other    Hep B titer    HPI:  Elizabeth BlakesCarol R Lin is a 69 y.o. female who presents to [facility] for hepatitis B titer. She says it's required for work. 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   Past Medical History  Diagnosis Date  . Allergy   . Arthritis   . Depression   . Thyroid disease   . Anxiety   . Hammer toe      Home Meds: Prior to Admission medications   Medication Sig Start Date End Date Taking? Authorizing Provider  Calcium-Vitamin D-Vitamin K (CALCIUM + D + K PO) Take 1,500 mg by mouth daily. 1500 mg+D1000 IU   Yes Historical Provider, MD  DULoxetine (CYMBALTA) 30 MG capsule TAKE 1 CAPSULE (30 MG) BY MOUTH DAILY  (NO MORE REFILLS WITHOUT OV) 07/03/15  Yes Morrell RiddleSarah L Weber, PA-C  ergocalciferol (VITAMIN D2) 50000 UNITS capsule Take 1 capsule (50,000 Units total) by mouth once a week. 09/06/14  Yes Sherren MochaEva N Shaw, MD  fish oil-omega-3 fatty acids 1000 MG capsule Take 2 g by mouth daily.   Yes Historical Provider, MD  levothyroxine (SYNTHROID, LEVOTHROID) 150 MCG tablet TAKE 1 TABLET BY MOUTH EVERY DAY BEFORE BREAKFAST 09/06/14  Yes Sherren MochaEva N Shaw, MD  Multiple Vitamins-Minerals (MULTIVITAMIN WITH MINERALS) tablet Take 1 tablet by mouth daily.   Yes Historical Provider, MD  OVER THE COUNTER MEDICATION OTC Glucosamine Chondroitin taking   Yes Historical Provider, MD  OVER THE  COUNTER MEDICATION OTC Lutigold extra taking one daily    Historical Provider, MD  zoster vaccine live, PF, (ZOSTAVAX) [id number] UNT/0.65ML injection Inject 19,400 Units into the skin once. Patient not taking: Reported on 03/26/2015 01/12/15   Sherren MochaEva N Shaw, MD    Allergies:  Allergies  Allergen Reactions  . Amoxicillin-Pot Clavulanate     Red rash    Social History   Social History  . Marital Status: Single    Spouse Name: N/A  . Number of Children: 3  . Years of Education: college   Occupational History  . 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 Smoking status: Former Smoker    Types: Cigarettes    Quit date: 11/01/1974  . Smokeless tobacco: Former NeurosurgeonUser    Quit date: 06/11/1982  . Alcohol Use: No     Comment: occasional wine  . Drug Use: No  . Sexual Activity: No   Other Topics Concern  . Not on file   Social History Narrative   Patient is Divorced and lives with grandson.   Patient's  Education: [employer].    Patient is right handed   Caffeine consumption 2-3 daily           Review of Systems: Constitutional: negative for fever, chills, night sweats, weight changes, or fatigue  HEENT: negative for vision changes, hearing loss,  congestion, rhinorrhea, ST, epistaxis, or sinus pressure Cardiovascular: negative for chest pain or palpitations Respiratory: negative for hemoptysis, wheezing, shortness of breath, or cough Abdominal: negative for abdominal pain, nausea, vomiting, diarrhea, or constipation Dermatological: negative for rash Neurologic: negative for headache, dizziness, or syncope All other systems reviewed and are otherwise negative with the exception to those above and in the HPI.  Physical Exam: Blood pressure 140/92, pulse 72, temperature 98 F (36.7 C), resp. rate 16, SpO2 98 %., There is no weight on file to calculate BMI. General: Well developed, well nourished, in no acute distress. Msk:  Strength and tone normal for  age. Extremities/Skin: Warm and dry. No clubbing or cyanosis. No edema. No rashes or suspicious lesions. Neuro: Alert and oriented X 3. Moves all extremities spontaneously. Gait is normal. CNII-XII grossly in tact. Psych:  Responds to questions appropriately with a normal affect.    ASSESSMENT AND PLAN:  69 y.o. year old female with need for hepatitis B immune  verification This chart was scribed in my presence and reviewed by me personally.    ICD-9-CM ICD-10-CM   1. Immunization counseling V65.49 Z71.89 Hepatitis B surface antibody     Signed, Elvina Sidle, MD     Signed, Elvina Sidle, MD 09/20/2015 6:37 PM

## 2015-09-21 LAB — HEPATITIS B SURFACE ANTIBODY,QUALITATIVE: Hep B S Ab: NEGATIVE

## 2015-10-06 ENCOUNTER — Other Ambulatory Visit: Payer: Self-pay | Admitting: Physician Assistant

## 2015-10-09 ENCOUNTER — Ambulatory Visit (INDEPENDENT_AMBULATORY_CARE_PROVIDER_SITE_OTHER): Payer: Medicare Other | Admitting: Family Medicine

## 2015-10-09 VITALS — BP 130/80 | HR 87 | Temp 97.7°F | Resp 20 | Ht 66.5 in | Wt 208.4 lb

## 2015-10-09 DIAGNOSIS — J069 Acute upper respiratory infection, unspecified: Secondary | ICD-10-CM

## 2015-10-09 MED ORDER — DULOXETINE HCL 30 MG PO CPEP: ORAL_CAPSULE | ORAL | Status: DC

## 2015-10-09 MED ORDER — LEVOTHYROXINE SODIUM 150 MCG PO TABS: ORAL_TABLET | ORAL | Status: DC

## 2015-10-09 NOTE — Progress Notes (Signed)
   Subjective:    Patient ID: Elizabeth Lin, female    DOB: [birth year], 70 y.o.   MRN: [id number] By signing my name below, I, Elizabeth Lin, attest that this documentation has been prepared under the direction and in the presence of Elvina SidleKurt Machele Deihl, MD.  Electronically Signed: Littie Deedsichard Lin, Medical Scribe. 10/09/2015. 2:07 PM.  HPI HPI Comments: Elizabeth Lin is a 70 y.o. female who presents to the [facility] complaining of gradual onset sore throat that started 3 days ago. She reports having associated right ear pressure and congestion. Patient notes she initially noticed yellow phlegm, but the phlegm is now green. She has been taking Zyrtec with relief. She has also been drinking water and tea.   Review of Systems  HENT: Positive for congestion, ear pain and sore throat.        Objective:   Physical Exam CONSTITUTIONAL: Well developed/well nourished HEAD: Normocephalic/atraumatic EYES: EOM/PERRL ENMT: Mucous membranes moist NECK: supple no meningeal signs SPINE: entire spine nontender CV: S1/S2 noted, no murmurs/rubs/gallops noted LUNGS: Lungs are clear to auscultation bilaterally, no apparent distress ABDOMEN: soft, nontender, no rebound or guarding GU: no cva tenderness NEURO: Pt is awake/alert, moves all extremitiesx4 EXTREMITIES: pulses normal, full ROM SKIN: warm, color normal PSYCH: no abnormalities of mood noted      Assessment & Plan:   This chart was scribed in my presence and reviewed by me personally.    ICD-9-CM ICD-10-CM   1. Acute upper respiratory infection 465.9 J06.9    continue symptomatic measures. Signed, Elvina SidleKurt Charnele Semple, MD

## 2015-10-13 ENCOUNTER — Ambulatory Visit (INDEPENDENT_AMBULATORY_CARE_PROVIDER_SITE_OTHER): Payer: Medicare Other | Admitting: Family Medicine

## 2015-10-13 VITALS — BP 120/76 | HR 56 | Temp 97.9°F | Resp 18 | Ht 66.0 in | Wt 207.2 lb

## 2015-10-13 DIAGNOSIS — R059 Cough, unspecified: Secondary | ICD-10-CM

## 2015-10-13 DIAGNOSIS — R05 Cough: Secondary | ICD-10-CM

## 2015-10-13 DIAGNOSIS — J029 Acute pharyngitis, unspecified: Secondary | ICD-10-CM

## 2015-10-13 MED ORDER — MUCINEX DM MAXIMUM STRENGTH 60-1200 MG PO TB12
1.0000 | ORAL_TABLET | Freq: Two times a day (BID) | ORAL | Status: DC
Start: 2015-10-13 — End: 2015-10-28

## 2015-10-13 MED ORDER — ALBUTEROL SULFATE (2.5 MG/3ML) 0.083% IN NEBU
2.5000 mg | INHALATION_SOLUTION | Freq: Once | RESPIRATORY_TRACT | Status: AC
  Administered 2015-10-13: 2.5 mg via RESPIRATORY_TRACT

## 2015-10-13 MED ORDER — FIRST-DUKES MOUTHWASH MT SUSP: 5.0000 mL | OROMUCOSAL | Status: DC | PRN

## 2015-10-13 MED ORDER — AZITHROMYCIN 250 MG PO TABS: ORAL_TABLET | ORAL | Status: DC

## 2015-10-13 NOTE — Progress Notes (Signed)
Subjective:    Patient ID: Elizabeth Lin, female    DOB: [birth year], 70 y.o.   MRN: [id number] By signing my name below, I, Javier Dockerobert Ryan Halas, attest that this documentation has been prepared under the direction and in the presence of Norberto SorensonEva Shaw, MD. Electronically Signed: Javier Dockerobert Ryan Halas, ER Scribe. 10/13/2015. 3:51 PM.  Chief Complaint  Patient presents with  . Follow-up  . Sore Throat   HPI HPI Comments: Elizabeth Lin is a 70 y.o. female who [facility] complaining of pressure in her head, right ear pain, cough, sore throat, chest congestion productive of light green sputum. She had one event of coughing up a small amount of blood. She endorses associated SOB due to throat congestion, nonexertional. She denies fever or chills. She is using Rhinocort nasal spray and zyrtec.  Past Medical History  Diagnosis Date  . Allergy   . Arthritis   . Depression   . Thyroid disease   . Anxiety   . Hammer toe    Allergies  Allergen Reactions  . Amoxicillin-Pot Clavulanate     Red rash   Current Outpatient Prescriptions on File Prior to Visit  Medication Sig Dispense Refill  . Calcium-Vitamin D-Vitamin K (CALCIUM + D + K PO) Take 1,500 mg by mouth daily. 1500 mg+D1000 IU    . DULoxetine (CYMBALTA) 30 MG capsule TAKE 1 CAPSULE BY MOUTH EVERY DAY 90 capsule 3  . ergocalciferol (VITAMIN D2) 50000 UNITS capsule Take 1 capsule (50,000 Units total) by mouth once a week. 12 capsule 0  . fish oil-omega-3 fatty acids 1000 MG capsule Take 2 g by mouth daily.    Marland Kitchen. levothyroxine (SYNTHROID, LEVOTHROID) 150 MCG tablet TAKE 1 TABLET BY MOUTH EVERY DAY BEFORE BREAKFAST 90 tablet 3  . Multiple Vitamins-Minerals (MULTIVITAMIN WITH MINERALS) tablet Take 1 tablet by mouth daily.    Marland Kitchen. OVER THE COUNTER MEDICATION OTC Lutigold extra taking one daily    . OVER THE COUNTER MEDICATION OTC Glucosamine Chondroitin taking    . zoster vaccine live, PF, (ZOSTAVAX) [id number] UNT/0.65ML injection Inject 19,400  Units into the skin once. (Patient not taking: Reported on 03/26/2015) 1 each 0   No current facility-administered medications on file prior to visit.    Review of Systems  Constitutional: Positive for fatigue. Negative for fever and chills.  HENT: Positive for congestion and sore throat.   Respiratory: Positive for cough. Negative for shortness of breath.   Neurological: Positive for headaches.      Objective:  BP 120/76 mmHg  Pulse 56  Temp(Src) 97.9 F (36.6 C) (Oral)  Resp 18  Ht 5\' 6"  (1.676 m)  Wt 207 lb 3.2 oz (93.985 kg)  BMI 33.46 kg/m2  SpO2 97%  Physical Exam  Constitutional: She is oriented to person, place, and time. She appears well-developed and well-nourished. No distress.  HENT:  Head: Normocephalic and atraumatic.  Mouth/Throat: No oropharyngeal exudate.  TMs clear. Throat with erythema.   Eyes: Pupils are equal, round, and reactive to light.  Neck: Neck supple.  Cardiovascular: Normal rate.   Pulmonary/Chest: Effort normal. No respiratory distress.  Few inspiratory rales. Course lung sounds throughout.   Musculoskeletal: Normal range of motion.  Neurological: She is alert and oriented to person, place, and time. Coordination normal.  Skin: Skin is warm and dry. She is not diaphoretic.  Psychiatric: She has a normal mood and affect. Her behavior is normal.  Nursing note and vitals reviewed.     Assessment &  Plan:   1. Cough   2. Acute pharyngitis, unspecified etiology   Suspect still viral so pt reassured but she is distressed by her continuing sxs so encouraged rest and continued supportive care but snap rx given in case sxs cont to worsen.  Meds ordered this encounter  Medications  . cetirizine (ZYRTEC) 10 MG tablet    Sig: Take 10 mg by mouth daily.  Marland Kitchen albuterol (PROVENTIL) (2.5 MG/3ML) 0.083% nebulizer solution 2.5 mg    Sig:   . Dextromethorphan-Guaifenesin (MUCINEX DM MAXIMUM STRENGTH) 60-1200 MG TB12    Sig: Take 1 tablet by mouth every 12  (twelve) hours.    Dispense:  14 each    Refill:  1  . Diphenhyd-Hydrocort-Nystatin (FIRST-DUKES MOUTHWASH) SUSP    Sig: Use as directed 5 mLs in the mouth or throat every 2 (two) hours as needed (sore throat).    Dispense:  237 mL    Refill:  0    1:1:1 ratio of diphenhydramine, hydrocortisone, nystatin or pharmacy formulary  . azithromycin (ZITHROMAX) 250 MG tablet    Sig: Take 2 tabs PO x 1 dose, then 1 tab PO QD x 4 days    Dispense:  6 tablet    Refill:  0    I personally performed the services described in this documentation, which was scribed in my presence. The recorded information has been reviewed and considered, and addended by me as needed.  Norberto Sorenson, MD MPH

## 2015-10-18 ENCOUNTER — Ambulatory Visit (INDEPENDENT_AMBULATORY_CARE_PROVIDER_SITE_OTHER): Payer: Medicare Other | Admitting: Family Medicine

## 2015-10-18 ENCOUNTER — Encounter: Payer: Self-pay | Admitting: Family Medicine

## 2015-10-18 ENCOUNTER — Ambulatory Visit: Payer: Medicare Other

## 2015-10-18 VITALS — BP 176/74 | HR 62 | Temp 98.1°F | Resp 16 | Ht 65.5 in | Wt 206.0 lb

## 2015-10-18 DIAGNOSIS — J209 Acute bronchitis, unspecified: Secondary | ICD-10-CM | POA: Diagnosis not present

## 2015-10-18 DIAGNOSIS — R05 Cough: Secondary | ICD-10-CM | POA: Diagnosis not present

## 2015-10-18 DIAGNOSIS — R059 Cough, unspecified: Secondary | ICD-10-CM

## 2015-10-18 LAB — POCT CBC
Granulocyte percent: 62.8 %G (ref 37–80)
HEMATOCRIT: 39.5 % (ref 37.7–47.9)
HEMOGLOBIN: 13.1 g/dL (ref 12.2–16.2)
LYMPH, POC: 2.2 (ref 0.6–3.4)
MCH, POC: 28.1 pg (ref 27–31.2)
MCHC: 33.2 g/dL (ref 31.8–35.4)
MCV: 84.5 fL (ref 80–97)
MID (cbc): 0.2 (ref 0–0.9)
MPV: 6.8 fL (ref 0–99.8)
POC Granulocyte: 4.1 (ref 2–6.9)
POC LYMPH PERCENT: 33.6 %L (ref 10–50)
POC MID %: 3.6 %M (ref 0–12)
Platelet Count, POC: 287 10*3/uL (ref 142–424)
RBC: 4.67 M/uL (ref 4.04–5.48)
RDW, POC: 14.5 %
WBC: 6.5 10*3/uL (ref 4.6–10.2)

## 2015-10-18 LAB — POCT SEDIMENTATION RATE: POCT SED RATE: 32 mm/h — AB (ref 0–22)

## 2015-10-18 MED ORDER — IPRATROPIUM BROMIDE 0.02 % IN SOLN: 0.5000 mg | Freq: Once | RESPIRATORY_TRACT | Status: DC

## 2015-10-18 MED ORDER — ALBUTEROL SULFATE (2.5 MG/3ML) 0.083% IN NEBU: 2.5000 mg | INHALATION_SOLUTION | Freq: Once | RESPIRATORY_TRACT | Status: DC

## 2015-10-18 MED ORDER — ALBUTEROL SULFATE 108 (90 BASE) MCG/ACT IN AEPB: 2.0000 | INHALATION_SPRAY | RESPIRATORY_TRACT | Status: DC | PRN

## 2015-10-18 NOTE — Patient Instructions (Signed)

## 2015-10-22 NOTE — Progress Notes (Signed)
Subjective:    Patient ID: Elizabeth Lin, female    DOB: 1946-01-18, 70 y.o.   MRN: [id number] Chief Complaint  Patient presents with  . Follow-up  . URI    HPI  Ms. Funderburke completed the z-pack and reports she is feeling much better.  Coughing but feels like she is mobilizing the mucous more. Energy and appetite better. No fevers. No ShoB or wheezing.  Past Medical History  Diagnosis Date  . Allergy   . Arthritis   . Depression   . Thyroid disease   . Anxiety   . Hammer toe    Current Outpatient Prescriptions on File Prior to Visit  Medication Sig Dispense Refill  . Calcium-Vitamin D-Vitamin K (CALCIUM + D + K PO) Take 1,500 mg by mouth daily. 1500 mg+D1000 IU    . cetirizine (ZYRTEC) 10 MG tablet Take 10 mg by mouth daily.    Marland Kitchen Dextromethorphan-Guaifenesin (MUCINEX DM MAXIMUM STRENGTH) 60-1200 MG TB12 Take 1 tablet by mouth every 12 (twelve) hours. 14 each 1  . Diphenhyd-Hydrocort-Nystatin (FIRST-DUKES MOUTHWASH) SUSP Use as directed 5 mLs in the mouth or throat every 2 (two) hours as needed (sore throat). 237 mL 0  . DULoxetine (CYMBALTA) 30 MG capsule TAKE 1 CAPSULE BY MOUTH EVERY DAY 90 capsule 3  . ergocalciferol (VITAMIN D2) 50000 UNITS capsule Take 1 capsule (50,000 Units total) by mouth once a week. 12 capsule 0  . fish oil-omega-3 fatty acids 1000 MG capsule Take 2 g by mouth daily.    Marland Kitchen levothyroxine (SYNTHROID, LEVOTHROID) 150 MCG tablet TAKE 1 TABLET BY MOUTH EVERY DAY BEFORE BREAKFAST 90 tablet 3  . Multiple Vitamins-Minerals (MULTIVITAMIN WITH MINERALS) tablet Take 1 tablet by mouth daily.    Marland Kitchen OVER THE COUNTER MEDICATION OTC Lutigold extra taking one daily    . OVER THE COUNTER MEDICATION OTC Glucosamine Chondroitin taking    . zoster vaccine live, PF, (ZOSTAVAX) 09326 UNT/0.65ML injection Inject 19,400 Units into the skin once. (Patient not taking: Reported on 10/18/2015) 1 each 0   No current facility-administered medications on file prior to visit.    Allergies  Allergen Reactions  . Amoxicillin-Pot Clavulanate     Red rash     Review of Systems  Constitutional: Positive for activity change, appetite change and fatigue. Negative for fever, chills, diaphoresis and unexpected weight change.  HENT: Positive for congestion and postnasal drip. Negative for facial swelling, rhinorrhea, sinus pressure and sore throat.   Respiratory: Positive for cough. Negative for chest tightness, shortness of breath and wheezing.   Cardiovascular: Negative for chest pain, palpitations and leg swelling.  Gastrointestinal: Negative for vomiting.  Genitourinary: Negative for decreased urine volume.  Skin: Negative for rash.  Neurological: Negative for weakness and headaches.  Hematological: Negative for adenopathy.  Psychiatric/Behavioral: Positive for sleep disturbance.       Objective:  BP 176/74 mmHg  Pulse 62  Temp(Src) 98.1 F (36.7 C)  Resp 16  Ht 5' 5.5" (1.664 m)  Wt 206 lb (93.441 kg)  BMI 33.75 kg/m2  Physical Exam  Constitutional: She is oriented to person, place, and time. She appears well-developed and well-nourished. She does not appear ill. No distress.  HENT:  Head: Normocephalic and atraumatic.  Right Ear: Tympanic membrane, external ear and ear canal normal.  Left Ear: Tympanic membrane, external ear and ear canal normal.  Nose: Rhinorrhea present. No mucosal edema. Right sinus exhibits no maxillary sinus tenderness. Left sinus exhibits no maxillary sinus tenderness.  Mouth/Throat: Uvula  is midline and mucous membranes are normal. Posterior oropharyngeal erythema present. No oropharyngeal exudate or posterior oropharyngeal edema.  Eyes: Conjunctivae are normal. Right eye exhibits no discharge. Left eye exhibits no discharge. No scleral icterus.  Neck: Normal range of motion. Neck supple.  Cardiovascular: Normal rate, regular rhythm, normal heart sounds and intact distal pulses.   Pulmonary/Chest: Effort normal. No tachypnea.  No respiratory distress. She has no decreased breath sounds. She has wheezes (expiratory) in the right lower field and the left lower field. She has rhonchi (expiratory) in the right upper field and the left upper field. She has rales (inspiratory) in the right lower field and the left lower field.  Had pt cough and try pulmonary toileting with deep breaths, inhaling and exhaling against pursed lips and repeat pulmonary exam MUCH improved with better air movement and bibasilar inspiratory rhonchi, mild exp wheeze  Lymphadenopathy:    She has no cervical adenopathy.  Neurological: She is alert and oriented to person, place, and time.  Skin: Skin is warm and dry. She is not diaphoretic. No erythema.  Psychiatric: She has a normal mood and affect. Her behavior is normal.          Results for orders placed or performed in visit on 10/18/15  POCT CBC  Result Value Ref Range   WBC 6.5 4.6 - 10.2 K/uL   Lymph, poc 2.2 0.6 - 3.4   POC LYMPH PERCENT 33.6 10 - 50 %L   MID (cbc) 0.2 0 - 0.9   POC MID % 3.6 0 - 12 %M   POC Granulocyte 4.1 2 - 6.9   Granulocyte percent 62.8 37 - 80 %G   RBC 4.67 4.04 - 5.48 M/uL   Hemoglobin 13.1 12.2 - 16.2 g/dL   HCT, POC 39.5 37.7 - 47.9 %   MCV 84.5 80 - 97 fL   MCH, POC 28.1 27 - 31.2 pg   MCHC 33.2 31.8 - 35.4 g/dL   RDW, POC 14.5 %   Platelet Count, POC 287 142 - 424 K/uL   MPV 6.8 0 - 99.8 fL  POCT SEDIMENTATION RATE  Result Value Ref Range   POCT SED RATE 32 (A) 0 - 22 mm/hr    Assessment & Plan:   1. Cough   2. Acute bronchitis, unspecified organism   Pulmonary exam still not normal but improved after pulmonary toileting in office so countinue at home.  Cbc/esr reassuring and pt feeling much better after completing antibiotics so expect continued improvement, f/u prn. Cont mucinex.  Pt did NOT receive a neb trx in office today - to long of a wait after ordering  Orders Placed This Encounter  Procedures  . POCT CBC  . POCT SEDIMENTATION  RATE    Meds ordered this encounter  Medications  . DISCONTD: albuterol (PROVENTIL) (2.5 MG/3ML) 0.083% nebulizer solution 2.5 mg    Sig:   . DISCONTD: ipratropium (ATROVENT) nebulizer solution 0.5 mg    Sig:   . Albuterol Sulfate (PROAIR RESPICLICK) 233 (90 Base) MCG/ACT AEPB    Sig: Inhale 2 puffs into the lungs every 4 (four) hours as needed.    Dispense:  1 each    Refill:  2    Delman Cheadle, MD MPH

## 2015-10-25 ENCOUNTER — Encounter: Payer: Self-pay | Admitting: Family Medicine

## 2015-10-25 ENCOUNTER — Ambulatory Visit (INDEPENDENT_AMBULATORY_CARE_PROVIDER_SITE_OTHER): Payer: Medicare Other | Admitting: Family Medicine

## 2015-10-25 VITALS — BP 142/80 | HR 96 | Temp 98.0°F | Resp 20 | Ht 65.5 in | Wt 208.2 lb

## 2015-10-25 DIAGNOSIS — R05 Cough: Secondary | ICD-10-CM

## 2015-10-25 DIAGNOSIS — J069 Acute upper respiratory infection, unspecified: Secondary | ICD-10-CM | POA: Diagnosis not present

## 2015-10-25 DIAGNOSIS — R059 Cough, unspecified: Secondary | ICD-10-CM

## 2015-10-25 MED ORDER — HYDROCODONE-HOMATROPINE 5-1.5 MG/5ML PO SYRP: 5.0000 mL | ORAL_SOLUTION | Freq: Every evening | ORAL | Status: DC | PRN

## 2015-10-25 MED ORDER — BENZONATATE 100 MG PO CAPS: 200.0000 mg | ORAL_CAPSULE | Freq: Two times a day (BID) | ORAL | Status: DC | PRN

## 2015-10-28 NOTE — Progress Notes (Signed)
Chief Complaint:  Chief Complaint  Patient presents with  . Cough    follow up     HPI: Elizabeth Lin is a 70 y.o. female who reports to [facility] today complaining of cough thathas persisted. She feels improved slightly but the cough is waking her up at night and makingher feel as if she is regressing with her sxs. She has tried everything Dr Lala Lund has given her. She does nto want it to get worse. No fevers or c hills. Taking mucines, albuterol inh  m dukes magic mouthwash  Past Medical History  Diagnosis Date  . Allergy   . Arthritis   . Depression   . Thyroid disease   . Anxiety   . Hammer toe    Past Surgical History  Procedure Laterality Date  . Appendectomy    . Tonsillectomy    . Oophorectomy Right   . Cystectomy      eye   Social History   Social History  . Marital Status: Single    Spouse Name: N/A  . Number of Children: 3  . Years of Education: college   Occupational History  . 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 Smoking status: Former Smoker    Types: Cigarettes    Quit date: 11/01/1974  . Smokeless tobacco: Former Neurosurgeon    Quit date: 06/11/1982  . Alcohol Use: No     Comment: occasional wine  . Drug Use: No  . Sexual Activity: No   Other Topics Concern  . None   Social History Narrative   Patient is Divorced and lives with grandson.   Patient's  Education: [employer].    Patient is right handed   Caffeine consumption 2-3 daily         Family History  Problem Relation Age of Onset  . Diabetes Father   . Myasthenia gravis Father   . Heart disease Father   . Thyroid disease Mother   . Cancer Mother   . Depression Mother   . Hypertension Brother   . Thyroid disease Daughter   . Depression Son   . Heart disease Paternal Grandmother   . Heart disease Paternal Grandfather    Allergies  Allergen Reactions  . Amoxicillin-Pot Clavulanate     Red rash   Prior to Admission medications   Medication Sig  Start Date End Date Taking? Authorizing Provider  Albuterol Sulfate (PROAIR RESPICLICK) 108 (90 Base) MCG/ACT AEPB Inhale 2 puffs into the lungs every 4 (four) hours as needed. 10/18/15  Yes Sherren Mocha, MD  Calcium-Vitamin D-Vitamin K (CALCIUM + D + K PO) Take 1,500 mg by mouth daily. 1500 mg+D1000 IU   Yes Historical Provider, MD  cetirizine (ZYRTEC) 10 MG tablet Take 10 mg by mouth daily.   Yes Historical Provider, MD  Dextromethorphan-Guaifenesin (MUCINEX DM MAXIMUM STRENGTH) 60-1200 MG TB12 Take 1 tablet by mouth every 12 (twelve) hours. 10/13/15  Yes Sherren Mocha, MD  Diphenhyd-Hydrocort-Nystatin (FIRST-DUKES MOUTHWASH) SUSP Use as directed 5 mLs in the mouth or throat every 2 (two) hours as needed (sore throat). 10/13/15  Yes Sherren Mocha, MD  DULoxetine (CYMBALTA) 30 MG capsule TAKE 1 CAPSULE BY MOUTH EVERY DAY 10/09/15  Yes Elvina Sidle, MD  ergocalciferol (VITAMIN D2) 50000 UNITS capsule Take 1 capsule (50,000 Units total) by mouth once a week. 09/06/14  Yes Sherren Mocha, MD  fish oil-omega-3 fatty acids 1000 MG capsule Take  2 g by mouth daily.   Yes Historical Provider, MD  levothyroxine (SYNTHROID, LEVOTHROID) 150 MCG tablet TAKE 1 TABLET BY MOUTH EVERY DAY BEFORE BREAKFAST 10/09/15  Yes Elvina Sidle, MD  Multiple Vitamins-Minerals (MULTIVITAMIN WITH MINERALS) tablet Take 1 tablet by mouth daily.   Yes Historical Provider, MD  OVER THE COUNTER MEDICATION OTC Lutigold extra taking one daily   Yes Historical Provider, MD  OVER THE COUNTER MEDICATION OTC Glucosamine Chondroitin taking   Yes Historical Provider, MD  zoster vaccine live, PF, (ZOSTAVAX) 60454 UNT/0.65ML injection Inject 19,400 Units into the skin once. 01/12/15  Yes Sherren Mocha, MD  benzonatate (TESSALON) 100 MG capsule Take 2 capsules (200 mg total) by mouth 2 (two) times daily as needed for cough. 10/25/15   Luv Mish P Paizlee Kinder, DO  HYDROcodone-homatropine (HYCODAN) 5-1.5 MG/5ML syrup Take 5 mLs by mouth at bedtime as needed. 10/25/15   Leathie Weich P Leann Mayweather,  DO     ROS: The patient denies fevers, chills, night sweats, unintentional weight loss, chest pain, palpitations, wheezing, dyspnea on exertion, nausea, vomiting, abdominal pain, dysuria, hematuria, melena, numbness, weakness, or tingling.  All other systems have been reviewed and were otherwise negative with the exception of those mentioned in the HPI and as above.    PHYSICAL EXAM: Filed Vitals:   10/25/15 1905 10/25/15 1908  BP: 154/78 142/80  Pulse: 96   Temp: 98 F (36.7 C)   Resp: 20    Body mass index is 34.11 kg/(m^2).   General: Alert, no acute distress HEENT:  Normocephalic, atraumatic, oropharynx patent. EOMI, PERRLA + erythematous throat. Tm normal, neg sinus tenderness Cardiovascular:  Regular rate and rhythm, no rubs murmurs or gallops.  No Carotid bruits, radial pulse intact. No pedal edema.  Respiratory: Clear to auscultation bilaterally.  No wheezes, rales, or rhonchi.  No cyanosis, no use of accessory musculature Abdominal: No organomegaly, abdomen is soft and non-tender, positive bowel sounds. No masses. Skin: No rashes. Neurologic: Facial musculature symmetric. Psychiatric: Patient acts appropriately throughout our interaction. Lymphatic: No cervical or submandibular lymphadenopathy Musculoskeletal: Gait intact. No edema, tenderness   LABS: Results for orders placed or performed in visit on 10/18/15  POCT CBC  Result Value Ref Range   WBC 6.5 4.6 - 10.2 K/uL   Lymph, poc 2.2 0.6 - 3.4   POC LYMPH PERCENT 33.6 10 - 50 %L   MID (cbc) 0.2 0 - 0.9   POC MID % 3.6 0 - 12 %M   POC Granulocyte 4.1 2 - 6.9   Granulocyte percent 62.8 37 - 80 %G   RBC 4.67 4.04 - 5.48 M/uL   Hemoglobin 13.1 12.2 - 16.2 g/dL   HCT, POC 09.8 11.9 - 47.9 %   MCV 84.5 80 - 97 fL   MCH, POC 28.1 27 - 31.2 pg   MCHC 33.2 31.8 - 35.4 g/dL   RDW, POC 14.7 %   Platelet Count, POC 287 142 - 424 K/uL   MPV 6.8 0 - 99.8 fL  POCT SEDIMENTATION RATE  Result Value Ref Range   POCT  SED RATE 32 (A) 0 - 22 mm/hr     EKG/XRAY:   Primary read interpreted by Dr. Conley Rolls at [facility].   ASSESSMENT/PLAN: Encounter Diagnoses  Name Primary?  . Cough Yes  . Acute upper respiratory infection    Rx tessalon perles, hycodan prn   Gross sideeffects, risk and benefits, and alternatives of medications d/w patient. Patient is aware that all medications have potential sideeffects and we  are unable to predict every sideeffect or drug-drug interaction that may occur.  Nedda Gains DO  10/28/2015 6:48 AM

## 2015-11-08 ENCOUNTER — Other Ambulatory Visit: Payer: Self-pay | Admitting: Family Medicine

## 2016-01-14 ENCOUNTER — Ambulatory Visit (INDEPENDENT_AMBULATORY_CARE_PROVIDER_SITE_OTHER): Payer: Medicare Other | Admitting: Internal Medicine

## 2016-01-14 VITALS — BP 122/78 | HR 68 | Temp 98.8°F | Resp 18 | Ht 65.5 in | Wt 206.2 lb

## 2016-01-14 DIAGNOSIS — J0100 Acute maxillary sinusitis, unspecified: Secondary | ICD-10-CM | POA: Diagnosis not present

## 2016-01-14 DIAGNOSIS — J4520 Mild intermittent asthma, uncomplicated: Secondary | ICD-10-CM

## 2016-01-14 MED ORDER — AZITHROMYCIN 250 MG PO TABS: ORAL_TABLET | ORAL | Status: DC

## 2016-01-14 MED ORDER — PREDNISONE 20 MG PO TABS: ORAL_TABLET | ORAL | Status: DC

## 2016-01-14 NOTE — Progress Notes (Signed)
By signing my name below I, Shelah LewandowskyJoseph Thomas, attest that this documentation has been prepared under the direction and in the presence of Ellamae Siaobert Doolittle, MD. Electonically Signed. Shelah LewandowskyJoseph Thomas, Scribe 01/14/2016 at 3:10 PM  Subjective:    Patient ID: Elizabeth Lin, female    DOB: 05/02/1946, 70 y.o.   MRN: [id number] Chief Complaint  Patient presents with  . Sinusitis    x2 days    HPI Elizabeth Lin is a 70 y.o. female who presents to the [facility] complaining of sinus pressure, HAs, and nasal congestion for the past 3 days. Pt reports wheezing and chest congestion. Pt states she has a mild cough that is not bothering her. Pt states she spits up phlegm in the morning. Pt reports having a house mate that was sick with similar symptoms 2 weeks ago. Pt states allergies do not usually bother her. Pt has a at home inhaler that she has used in the past for respiratory illnesses.  Patient Active Problem List   Diagnosis Date Noted  . Vitamin D deficiency 09/06/2014  . Right knee pain 02/24/2013  . Hypothyroid 11/02/2011  . Depression 11/02/2011  . 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  Calcium-Vitamin D-Vitamin K (CALCIUM + D + K PO), Take 1,500 mg by mouth daily. 1500 mg+D1000 IU, Disp: , Rfl:  .  cetirizine (ZYRTEC) 10 MG tablet, Take 10 mg by mouth daily., Disp: , Rfl:  .  DULoxetine (CYMBALTA) 30 MG capsule, TAKE 1 CAPSULE BY MOUTH EVERY DAY, Disp: 90 capsule, Rfl: 3 .  fish oil-omega-3 fatty acids 1000 MG capsule, Take 2 g by mouth daily., Disp: , Rfl:  .  levothyroxine (SYNTHROID, LEVOTHROID) 150 MCG tablet, TAKE 1 TABLET BY MOUTH EVERY DAY BEFORE BREAKFAST, Disp: 90 tablet, Rfl: 3 .  Multiple Vitamins-Minerals (MULTIVITAMIN WITH MINERALS) tablet, Take 1 tablet by mouth daily., Disp: , Rfl:  .  OVER THE COUNTER MEDICATION, OTC Lutigold extra taking one daily, Disp: , Rfl:  .  OVER THE COUNTER MEDICATION, OTC Glucosamine  Chondroitin taking, Disp: , Rfl:  .  zoster vaccine live, PF, (ZOSTAVAX) [id number] UNT/0.65ML injection, Inject 19,400 Units into the skin once., Disp: 1 each, Rfl: 0 .  Albuterol Sulfate (PROAIR RESPICLICK) 108 (90 Base) MCG/ACT AEPB, Inhale 2 puffs into the lungs every 4 (four) hours as needed. (Patient not taking: Reported on 01/14/2016), Disp: 1 each, Rfl: 2 .  benzonatate (TESSALON) 100 MG capsule, Take 2 capsules (200 mg total) by mouth 2 (two) times daily as needed for cough. (Patient not taking: Reported on 01/14/2016), Disp: 30 capsule, Rfl: 0 .  ergocalciferol (VITAMIN D2) 50000 UNITS capsule, Take 1 capsule (50,000 Units total) by mouth once a week. (Patient not taking: Reported on 01/14/2016), Disp: 12 capsule, Rfl: 0 .  HYDROcodone-homatropine (HYCODAN) 5-1.5 MG/5ML syrup, Take 5 mLs by mouth at bedtime as needed. (Patient not taking: Reported on 01/14/2016), Disp: 120 mL, Rfl: 0  Allergies  Allergen Reactions  . Amoxicillin-Pot Clavulanate     Red rash   Social History   Social History  . Marital Status: Single    Spouse Name: N/A  . Number of Children: 3  . Years of Education: college   Occupational History  . 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 Smoking status: Former Smoker    Types: Cigarettes    Quit date: 11/01/1974  . Smokeless tobacco: Former NeurosurgeonUser  Quit date: 06/11/1982  . Alcohol Use: No     Comment: occasional wine  . Drug Use: No  . Sexual Activity: No   Other Topics Concern  . Not on file   Social History Narrative   Patient is Divorced and lives with grandson.   Patient's  Education: [employer].    Patient is right handed   Caffeine consumption 2-3 daily            Review of Systems  HENT: Positive for congestion.   Respiratory: Positive for cough.   Neurological: Positive for headaches.       Objective:   Physical Exam  Constitutional: She is oriented to person, place, and time. She appears well-developed and  well-nourished. No distress.  HENT:  Head: Normocephalic and atraumatic.  Right Ear: Tympanic membrane and external ear normal.  Left Ear: Tympanic membrane and external ear normal.  Mouth/Throat: Oropharynx is clear and moist.  Pt has purulent nasal discharge. Tender max to perc  Eyes: Conjunctivae are normal. Pupils are equal, round, and reactive to light.  Neck: Neck supple. No thyromegaly present.  Cardiovascular: Normal rate, regular rhythm and normal heart sounds.  Exam reveals no gallop.   No murmur heard. Pulmonary/Chest: Effort normal. She has wheezes (forced expiratory, bilat).  Lymphadenopathy:    She has no cervical adenopathy.  Neurological: She is alert and oriented to person, place, and time. Gait normal.  Skin: Skin is warm and dry.  Psychiatric: She has a normal mood and affect. Her behavior is normal.  Nursing note and vitals reviewed.   Filed Vitals:   01/14/16 1429  BP: 122/78  Pulse: 68  Temp: 98.8 F (37.1 C)  TempSrc: Oral  Resp: 18  Height: 5' 5.5" (1.664 m)  Weight: 206 lb 3.2 oz (93.532 kg)  SpO2: 98%         Assessment & Plan:  I have completed the patient encounter in its entirety as documented by the scribe, with editing by me where necessary. Robert P. Merla Riches, M.D.  Acute maxillary sinusitis, recurrence not specified  Reactive airway disease, mild intermittent, uncomplicated   Meds ordered this encounter  Medications  . azithromycin (ZITHROMAX) 250 MG tablet    Sig: As packaged    Dispense:  6 tablet    Refill:  0  . predniSONE (DELTASONE) 20 MG tablet    Sig: 3/3/2/2/1/1 single daily dose for 6 days    Dispense:  12 tablet    Refill:  0

## 2016-01-14 NOTE — Patient Instructions (Signed)
     IF you received an x-ray today, you will receive an invoice from [facility]. Please contact [facility] at [phone] with questions or concerns regarding your invoice.   IF you received labwork today, you will receive an invoice from [facility]. Please contact [facility] at [phone] with questions or concerns regarding your invoice.   Our billing staff will not be able to assist you with questions regarding bills from these companies.  You will be contacted with the lab results as soon as they are available. The fastest way to get your results is to activate your My Chart account. Instructions are located on the last page of this paperwork. If you have not heard from us regarding the results in 2 weeks, please contact this office.      

## 2016-06-17 ENCOUNTER — Ambulatory Visit (INDEPENDENT_AMBULATORY_CARE_PROVIDER_SITE_OTHER): Payer: Medicare Other | Admitting: Physician Assistant

## 2016-06-17 VITALS — BP 136/82 | HR 60 | Temp 97.6°F | Resp 17 | Ht 65.5 in | Wt 210.0 lb

## 2016-06-17 DIAGNOSIS — H6983 Other specified disorders of Eustachian tube, bilateral: Secondary | ICD-10-CM | POA: Diagnosis not present

## 2016-06-17 DIAGNOSIS — H6993 Unspecified Eustachian tube disorder, bilateral: Secondary | ICD-10-CM

## 2016-06-17 NOTE — Progress Notes (Signed)
Elizabeth BlakesCarol R Lin  MRN: [id number] DOB: [birth year]  PCP: Norberto SorensonSHAW,EVA, MD  Subjective:  Pt is a 70 year old presents to clinic for decreased hearing in right ear. She noticed a pimple near the opening of her right ear the past few days. It popped yesterday and now she is having decreased hearing in right ear.   Denies drainage, pain, tenderness to palpation, ringing in ears, dizziness.  She sees ENT annual to have ear wax removed from ears b/l. ENT recommended her to start course of antihistamine and nasal steroid spray during the season change last spring.  Wears hearing aids b/l x 1 year.   Review of Systems  Constitutional: Negative.   HENT: Positive for hearing loss. Negative for congestion, ear discharge, ear pain, postnasal drip, rhinorrhea, sinus pressure, sore throat and tinnitus.   Skin: Positive for wound (right ear). Negative for color change, pallor and rash.  Neurological: Negative for dizziness, light-headedness, numbness and headaches.    Patient Active Problem List   Diagnosis Date Noted  . Vitamin D deficiency 09/06/2014  . Right knee pain 02/24/2013  . Hypothyroid 11/02/2011  . Depression 11/02/2011  . 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 Calcium-Vitamin D-Vitamin K (CALCIUM + D + K PO) Take 1,500 mg by mouth daily. 1500 mg+D1000 IU    . cetirizine (ZYRTEC) 10 MG tablet Take 10 mg by mouth daily.    . DULoxetine (CYMBALTA) 30 MG capsule TAKE 1 CAPSULE BY MOUTH EVERY DAY 90 capsule 3  . ergocalciferol (VITAMIN D2) 50000 UNITS capsule Take 1 capsule (50,000 Units total) by mouth once a week. 12 capsule 0  . fish oil-omega-3 fatty acids 1000 MG capsule Take 2 g by mouth daily.    Marland Kitchen. levothyroxine (SYNTHROID, LEVOTHROID) 150 MCG tablet TAKE 1 TABLET BY MOUTH EVERY DAY BEFORE BREAKFAST 90 tablet 3  . Multiple Vitamins-Minerals (MULTIVITAMIN WITH MINERALS) tablet Take 1 tablet by  mouth daily.    Marland Kitchen. OVER THE COUNTER MEDICATION OTC Lutigold extra taking one daily    . OVER THE COUNTER MEDICATION OTC Glucosamine Chondroitin taking    . zoster vaccine live, PF, (ZOSTAVAX) [id number] UNT/0.65ML injection Inject 19,400 Units into the skin once. 1 each 0  . HYDROcodone-homatropine (HYCODAN) 5-1.5 MG/5ML syrup Take 5 mLs by mouth at bedtime as needed. (Patient not taking: Reported on 06/17/2016) 120 mL 0  . predniSONE (DELTASONE) 20 MG tablet 3/3/2/2/1/1 single daily dose for 6 days (Patient not taking: Reported on 06/17/2016) 12 tablet 0   No current facility-administered medications on file prior to visit.     Allergies  Allergen Reactions  . Amoxicillin-Pot Clavulanate     Red rash    Objective:  BP 136/82 (BP Location: Right Arm, Patient Position: Sitting, Cuff Size: Large)   Pulse 60   Temp 97.6 F (36.4 C) (Oral)   Resp 17   Ht 5' 5.5" (1.664 m)   Wt 210 lb (95.3 kg)   SpO2 100%   BMI 34.41 kg/m   Physical Exam  Constitutional: She is oriented to person, place, and time and well-developed, well-nourished, and in no distress. No distress.  HENT:  Right Ear: Ear canal normal. There is swelling (posterior tragus ). No drainage or tenderness. No mastoid tenderness. Tympanic membrane is scarred and retracted. Decreased hearing is noted.  Left Ear: External ear and ear canal normal. Tympanic membrane is retracted. Decreased hearing is noted.  Cardiovascular: Normal rate, regular rhythm and normal heart sounds.   Neurological: She is alert and oriented to person, place, and time. GCS score is 15.  Skin: Skin is warm and dry.  Psychiatric: Mood, memory, affect and judgment normal.  Vitals reviewed.   Assessment and Plan :  1. Eustachian tube dysfunction, bilateral - Supportive care: Encouraged patient to use OTC nasal steroid spray and anti-histamine until her symptoms improve. Instructed patient to refrain from using right hearing aid for one week to allow swelling  of posterior tragus to reduce.  - RTC if symptoms do not improve/worsen.    Marco Collie, PA-C  [facility] 06/17/2016 6:34 PM

## 2016-06-17 NOTE — Patient Instructions (Addendum)
     IF you received an x-ray today, you will receive an invoice from [facility]. Please contact [facility] at [phone] with questions or concerns regarding your invoice.   IF you received labwork today, you will receive an invoice from [facility]. Please contact [facility] at [phone] with questions or concerns regarding your invoice.   Our billing staff will not be able to assist you with questions regarding bills from these companies.  You will be contacted with the lab results as soon as they are available. The fastest way to get your results is to activate your My Chart account. Instructions are located on the last page of this paperwork. If you have not heard from us regarding the results in 2 weeks, please contact this office.     We recommend that you schedule a mammogram for breast cancer screening. Typically, you do not need a referral to do this. Please contact a local imaging center to schedule your mammogram.  [facility] - [phone]  *ask for the Radiology Department [facility]) - [phone] or [phone]  [facility] - [phone] [facility] - [phone] [facility] - [phone]  *ask for the Radiology Department [facility] - [phone]  *ask for the Radiology Department [facility] - [phone]  *ask for the Mammography Department [facility] - [phone]  

## 2016-07-17 ENCOUNTER — Encounter: Payer: Medicare Other | Admitting: Family Medicine

## 2016-09-04 ENCOUNTER — Encounter: Payer: Self-pay | Admitting: Family Medicine

## 2016-09-04 ENCOUNTER — Ambulatory Visit (INDEPENDENT_AMBULATORY_CARE_PROVIDER_SITE_OTHER): Payer: Medicare Other | Admitting: Family Medicine

## 2016-09-04 VITALS — BP 126/86 | HR 68 | Temp 98.3°F | Resp 16 | Ht 65.75 in | Wt 211.4 lb

## 2016-09-04 DIAGNOSIS — Z1383 Encounter for screening for respiratory disorder NEC: Secondary | ICD-10-CM | POA: Diagnosis not present

## 2016-09-04 DIAGNOSIS — Z5181 Encounter for therapeutic drug level monitoring: Secondary | ICD-10-CM

## 2016-09-04 DIAGNOSIS — E039 Hypothyroidism, unspecified: Secondary | ICD-10-CM

## 2016-09-04 DIAGNOSIS — Z1389 Encounter for screening for other disorder: Secondary | ICD-10-CM | POA: Diagnosis not present

## 2016-09-04 DIAGNOSIS — Z0000 Encounter for general adult medical examination without abnormal findings: Secondary | ICD-10-CM | POA: Diagnosis not present

## 2016-09-04 DIAGNOSIS — Z113 Encounter for screening for infections with a predominantly sexual mode of transmission: Secondary | ICD-10-CM | POA: Diagnosis not present

## 2016-09-04 DIAGNOSIS — Z1231 Encounter for screening mammogram for malignant neoplasm of breast: Secondary | ICD-10-CM | POA: Diagnosis not present

## 2016-09-04 DIAGNOSIS — Z23 Encounter for immunization: Secondary | ICD-10-CM | POA: Diagnosis not present

## 2016-09-04 DIAGNOSIS — Z136 Encounter for screening for cardiovascular disorders: Secondary | ICD-10-CM

## 2016-09-04 MED ORDER — DULOXETINE HCL 30 MG PO CPEP: ORAL_CAPSULE | ORAL | 3 refills | Status: DC

## 2016-09-04 MED ORDER — ZOSTER VACCINE LIVE 19400 UNT/0.65ML ~~LOC~~ SUSR: 0.6500 mL | Freq: Once | SUBCUTANEOUS | 0 refills | Status: AC

## 2016-09-04 NOTE — Progress Notes (Deleted)
   Subjective:    Patient ID: Elizabeth BlakesCarol R Hulgan, female    DOB: 1945/10/26, 70 y.o.   MRN: [id number]  HPI    Review of Systems     Objective:   Physical Exam        Assessment & Plan:

## 2016-09-04 NOTE — Patient Instructions (Addendum)
IF you received an x-ray today, [facility]. Please contact [facility] at [phone] with questions or concerns regarding your invoice.   IF you received labwork today, you will receive an invoice from [facility]. Please contact [facility] at [phone] with questions or concerns regarding your invoice.   Our billing staff will not be able to assist you with questions regarding bills from these companies.  You will be contacted with the lab results as soon as they are available. The fastest way to get your results is to activate your My Chart account. Instructions are located on the last page of this paperwork. If you have not heard from us regarding the results in 2 weeks, please contact this office.     Bone Health Introduction Bones protect organs, store calcium, and anchor muscles. Good health habits, such as eating nutritious foods and exercising regularly, are important for maintaining healthy bones. They can also help to prevent a condition that causes bones to lose density and become weak and brittle (osteoporosis). Why is bone mass important? Bone mass refers to the amount of bone tissue that you have. The higher your bone mass, the stronger your bones. An important step toward having healthy bones throughout life is to have strong and dense bones during childhood. A young adult who has a high bone mass is more likely to have a high bone mass later in life. Bone mass at its greatest it is called peak bone mass. A large decline in bone mass occurs in older adults. In women, it occurs about the time of menopause. During this time, it is important to practice good health habits, because if more bone is lost than what is replaced, the bones will become less healthy and more likely to break (fracture). If you find that you have a low bone mass, you may be able to prevent osteoporosis or further bone loss by  changing your diet and lifestyle. How can I find out if my bone mass is low? Bone mass can be measured with an X-ray test that is called a bone mineral density (BMD) test. This test is recommended for all women who are age 71 or older. It may also be recommended for men who are age 71 or older, or for people who are more likely to develop osteoporosis due to:  Having bones that break easily.  Having a long-term disease that weakens bones, such as kidney disease or rheumatoid arthritis.  Having menopause earlier than normal.  Taking medicine that weakens bones, such as steroids, thyroid hormones, or hormone treatment for breast cancer or prostate cancer.  Smoking.  Drinking three or more alcoholic drinks each day. What are the nutritional recommendations for healthy bones? To have healthy bones, you need to get enough of the right minerals and vitamins. Most nutrition experts recommend getting these nutrients from the foods that you eat. Nutritional recommendations vary from person to person. Ask your health care provider what is healthy for you. Here are some general guidelines. Calcium Recommendations  Calcium is the most important (essential) mineral for bone health. Most people can get enough calcium from their diet, but supplements may be recommended for people who are at risk for osteoporosis. Good sources of calcium include:  Dairy products, such as low-fat or nonfat milk, cheese, and yogurt.  Dark green leafy vegetables, such as bok choy and broccoli.  Calcium-fortified foods, such as orange juice, cereal, bread, soy beverages, and  tofu products.  Nuts, such as almonds. Follow these recommended amounts for daily calcium intake:  Children, age 32?3: 700 mg.  Children, age 50?8: 1,000 mg.  Children, age 74?13: 1,300 mg.  Teens, age 324?18: 1,300 mg.  Adults, age 329?50: 1,000 mg.  Adults, age 43?70:  Men: 1,000 mg.  Women: 1,200 mg.  Adults, age 71 or older: 1,200  mg.  Pregnant and breastfeeding females:  Teens: 1,300 mg.  Adults: 1,000 mg. Vitamin D Recommendations  Vitamin D is the most essential vitamin for bone health. It helps the body to absorb calcium. Sunlight stimulates the skin to make vitamin D, so be sure to get enough sunlight. If you live in a cold climate or you do not get outside often, your health care provider may recommend that you take vitamin D supplements. Good sources of vitamin D in your diet include:  Egg yolks.  Saltwater fish.  Milk and cereal fortified with vitamin D. Follow these recommended amounts for daily vitamin D intake:  Children and teens, age 32?18: 600 international units.  Adults, age 71 or younger: 400-800 international units.  Adults, age 71 or older: 800-1,000 international units. Other Nutrients  Other nutrients for bone health include:  Phosphorus. This mineral is found in meat, poultry, dairy foods, nuts, and legumes. The recommended daily intake for adult men and adult women is 700 mg.  Magnesium. This mineral is found in seeds, nuts, dark green vegetables, and legumes. The recommended daily intake for adult men is 400?420 mg. For adult women, it is 310?320 mg.  Vitamin K. This vitamin is found in green leafy vegetables. The recommended daily intake is 120 mg for adult men and 90 mg for adult women. What type of physical activity is best for building and maintaining healthy bones? Weight-bearing and strength-building activities are important for building and maintaining peak bone mass. Weight-bearing activities cause muscles and bones to work against gravity. Strength-building activities increases muscle strength that supports bones. Weight-bearing and muscle-building activities include:  Walking and hiking.  Jogging and running.  Dancing.  Gym exercises.  Lifting weights.  Tennis and racquetball.  Climbing stairs.  Aerobics. Adults should get at least 30 minutes of moderate physical  activity on most days. Children should get at least 60 minutes of moderate physical activity on most days. Ask your health care provide what type of exercise is best for you. Where can I find more information? For more information, check out the following websites:  National Osteoporosis Foundation: http://burton-owens.org/http://nof.org/learn/basics  [employer]: http://www.niams.http://www.johnson-fowler.biz/nih.gov/Health_Info/Bone/Bone_Health/bone_health_for_life.asp This information is not intended to replace advice given to you by your health care provider. Make sure you discuss any questions you have with your health care provider. Document Released: 12/06/2003 Document Revised: 04/04/2016 Document Reviewed: 09/20/2014  2017 [employer]

## 2016-09-04 NOTE — Progress Notes (Signed)
Subjective:   Chief Complaint  Patient presents with  . Annual Exam     Elizabeth Lin is a 70 y.o. female who presents for Medicare Annual/Subsequent preventive examination. Her last visit for review of chronic meds was 12/2014, last cpe 07/2013. She [facility] Dr[facility] Elnoria HowardHung but not sure. She is trying to get into a [facility] so they did bone density this week - and did blood tests as well - it is for people > 70 for 18 mos - her next appt in 1/2 meets 1x/wk, food journal, fit bit.  5 groups of 36 people and one group gets treadbill, another gets fitbit reminder and others get mixed.  Sleeping more but feeling fine cymbalta 30 has been stable and doing well. 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  Her daughter is here in [place].     Preventive Screening-Counseling & Management  Tobacco History  Smoking Status  . Former Smoker  . Types: Cigarettes  . Quit date: 11/01/1974  Smokeless Tobacco  . Former NeurosurgeonUser  . Quit date: 06/11/1982     Problems Prior to Visit 1.  Saw optho 2 yrs ago beginning cataracts.  Current Problems (verified) Patient Active Problem List   Diagnosis Date Noted  . Vitamin D deficiency 09/06/2014  . Right knee pain 02/24/2013  . Hypothyroid 11/02/2011  . Depression 11/02/2011  . 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 Calcium-Vitamin D-Vitamin K (CALCIUM + D + K PO) Take 1,500 mg by mouth daily. 1500 mg+D1000 IU    . DULoxetine (CYMBALTA) 30 MG capsule TAKE 1 CAPSULE BY MOUTH EVERY DAY 90 capsule 3  . ergocalciferol (VITAMIN D2) 50000 UNITS capsule Take 1 capsule (50,000 Units total) by mouth once a week. 12 capsule 0   . levothyroxine (SYNTHROID, LEVOTHROID) 150 MCG tablet TAKE 1 TABLET BY MOUTH EVERY DAY BEFORE BREAKFAST 90 tablet 3  . Multiple Vitamins-Minerals (MULTIVITAMIN WITH MINERALS) tablet Take 1 tablet by mouth daily.    Marland Kitchen. OVER THE COUNTER MEDICATION OTC Lutigold extra taking one daily    . OVER THE COUNTER MEDICATION OTC Glucosamine Chondroitin taking    . cetirizine (ZYRTEC) 10 MG tablet Take 10 mg by mouth daily.    . fish oil-omega-3 fatty acids 1000 MG capsule Take 2 g by mouth daily.    Marland Kitchen. zoster vaccine live, PF, (ZOSTAVAX) [id number] UNT/0.65ML injection Inject 19,400 Units into the skin once. (Patient not taking: Reported on 09/04/2016) 1 each 0   No current facility-administered medications on file prior to visit.     Current Medications (verified) Current Outpatient Prescriptions  Medication Sig Dispense Refill  . Calcium-Vitamin D-Vitamin K (CALCIUM + D + K PO) Take 1,500 mg by mouth daily. 1500 mg+D1000 IU    . DULoxetine (CYMBALTA) 30 MG capsule TAKE 1 CAPSULE BY MOUTH EVERY DAY 90 capsule 3  . ergocalciferol (VITAMIN D2) 50000 UNITS capsule Take 1 capsule (50,000 Units total) by mouth once a week. 12 capsule 0  . levothyroxine (SYNTHROID, LEVOTHROID) 150 MCG tablet TAKE 1 TABLET BY MOUTH EVERY DAY BEFORE BREAKFAST 90 tablet 3  .  Multiple Vitamins-Minerals (MULTIVITAMIN WITH MINERALS) tablet Take 1 tablet by mouth daily.    Marland Kitchen OVER THE COUNTER MEDICATION OTC Lutigold extra taking one daily    . OVER THE COUNTER MEDICATION OTC Glucosamine Chondroitin taking    . cetirizine (ZYRTEC) 10 MG tablet Take 10 mg by mouth daily.    . fish oil-omega-3 fatty acids 1000 MG capsule Take 2 g by mouth daily.    Marland Kitchen zoster vaccine live, PF, (ZOSTAVAX) 19147 UNT/0.65ML injection Inject 19,400 Units into the skin once. (Patient not taking: Reported on 09/04/2016) 1 each 0   No current facility-administered medications for this visit.      Allergies (verified) Amoxicillin-pot clavulanate   PAST  HISTORY  Family History Family History  Problem Relation Age of Onset  . Diabetes Father   . Myasthenia gravis Father   . Heart disease Father   . Thyroid disease Mother   . Cancer Mother   . Depression Mother   . Hypertension Brother   . Thyroid disease Daughter   . Depression Son   . Heart disease Paternal Grandmother   . Heart disease Paternal Grandfather     Social History Social History  Substance Use Topics  . Smoking status: Former Smoker    Types: Cigarettes    Quit date: 11/01/1974  . Smokeless tobacco: Former Neurosurgeon    Quit date: 06/11/1982  . Alcohol use No     Comment: occasional wine     Are there smokers in your home (other than you)? No  Risk Factors Current exercise habits: The patient does not participate in regular exercise at present.  Dietary issues discussed: normal, tries to eat healthy   Cardiac risk factors: advanced age (older than 33 for men, 16 for women).  Depression Screen Depression screen [facility] 2/9 09/04/2016 06/17/2016 01/14/2016 10/25/2015 10/18/2015  Decreased Interest 0 0 0 0 0  Down, Depressed, Hopeless 0 0 0 0 0  PHQ - 2 Score 0 0 0 0 0  Altered sleeping - - - - -  Tired, decreased energy - - - - -  Change in appetite - - - - -  Feeling bad or failure about yourself  - - - - -  Trouble concentrating - - - - -  Moving slowly or fidgety/restless - - - - -  Suicidal thoughts - - - - -  PHQ-9 Score - - - - -  Difficult doing work/chores - - - - -     Activities of Daily Living In your present state of health, do you have any difficulty performing the following activities?:  Driving? No Managing money?  No Feeding yourself? No Getting from bed to chair? No Climbing a flight of stairs? No Preparing food and eating?: No Bathing or showering? No Getting dressed: No Getting to the toilet? No Using the toilet:No Moving around from place to place: No In the past year have you fallen or had a near fall?:No   Are you sexually active?   No  Do you have more than one partner?  No  Hearing Difficulties: No Do you often ask people to speak up or repeat themselves? No Do you experience ringing or noises in your ears? No Do you have difficulty understanding soft or whispered voices? No   Do you feel that you have a problem with memory? No  Do you often misplace items? No  Do you feel safe at home?  Yes  Cognitive Testing  Alert? Yes  Normal Appearance?Yes  Oriented to person? Yes  Place? Yes   Time? Yes  Recall of three objects?  Yes  Can perform simple calculations? Yes  Displays appropriate judgment?Yes  Can read the correct time from a watch face?Yes   Advanced Directives have been discussed with the patient? Yes  List the Names of Other Physician/Practitioners you currently use: 1.    Indicate any recent Medical Services you may have received from other than [facility] providers in the past year (date may be approximate).  Immunization History  Administered Date(s) Administered  . Influenza, Seasonal, Injecte, Preservative Fre 11/05/2012  . Influenza,inj,Quad PF,36+ Mos 06/21/2013, 09/01/2014  . Influenza-Unspecified 06/21/2016  . Tdap 03/16/2012    Screening Tests Health Maintenance  Topic Date Due  . Hepatitis C Screening  [birth year]  . ZOSTAVAX  09/20/2005  . PNA vac Low Risk Adult (1 of 2 - PCV13) 09/20/2010  . COLONOSCOPY  09/29/2013  . MAMMOGRAM  06/29/2015  . TETANUS/TDAP  03/16/2022  . INFLUENZA VACCINE  Completed  . DEXA SCAN  Completed    All answers were reviewed with the patient and necessary referrals were made:  SHAW,EVA, MD   09/04/2016   History reviewed: allergies, current medications, past family history, past medical history, past social history, past surgical history and problem list  Review of Systems Pertinent items noted in HPI and remainder of comprehensive ROS otherwise negative.    Objective:    Visual Acuity Screening   Right eye Left eye Both eyes  Without correction:      With correction: 20/30 20/30 20/25     Body mass index is 34.38 kg/m. Pulse 68   Temp 98.3 F (36.8 C) (Oral)   Resp 16   Ht 5' 5.75" (1.67 m)   Wt 211 lb 6.4 oz (95.9 kg)   SpO2 97%   BMI 34.38 kg/m   BP 126/86   Pulse 68   Temp 98.3 F (36.8 C) (Oral)   Resp 16   Ht 5' 5.75" (1.67 m)   Wt 211 lb 6.4 oz (95.9 kg)   SpO2 97%   BMI 34.38 kg/m   General Appearance:    Alert, cooperative, no distress, appears stated age  Head:    Normocephalic, without obvious abnormality, atraumatic  Eyes:    PERRL, conjunctiva/corneas clear, EOM's intact, fundi    benign, both eyes  Ears:    Normal TM's and external ear canals, both ears  Nose:   Nares normal, septum midline, mucosa normal, no drainage    or sinus tenderness  Throat:   Lips, mucosa, and tongue normal; teeth and gums normal  Neck:   Supple, symmetrical, trachea midline, no adenopathy;    thyroid:  no enlargement/tenderness/nodules; no carotid   bruit or JVD  Back:     Symmetric, no curvature, ROM normal, no CVA tenderness  Lungs:     Clear to auscultation bilaterally, respirations unlabored  Chest Wall:    No tenderness or deformity   Heart:    Regular rate and rhythm, S1 and S2 normal, no murmur, rub   or gallop  Breast Exam:    No tenderness, masses, or nipple abnormality  Abdomen:     Soft, non-tender, bowel sounds active all four quadrants,    no masses, no organomegaly  Genitalia:    Normal female without lesion, discharge or tenderness  Rectal:    Normal tone, normal prostate, no masses or tenderness;   guaiac negative stool  Extremities:   Extremities normal, atraumatic, no cyanosis or  edema  Pulses:   2+ and symmetric all extremities  Skin:   Skin color, texture, turgor normal, no rashes or lesions  Lymph nodes:   Cervical, supraclavicular, and axillary nodes normal  Neurologic:   CNII-XII intact, normal strength, sensation and reflexes    throughout        Assessment:    1. Annual physical exam    2. Encounter for screening mammogram for breast cancer   3. Screening for cardiovascular, respiratory, and genitourinary diseases   4. Need for pneumococcal vaccination   5. Need for shingles vaccine   6. Hypothyroidism, unspecified type - TSH mildly elevated at 6.5 and patient with some complaints of hypothyroid symptoms so increase levothyroxine from 150-175 g and recheck TSH in 6-8 weeks for additional refills.   7. Screen for STD (sexually transmitted disease)   8. Medication monitoring encounter         Plan:     During the course of the visit the patient was educated and counseled about appropriate screening and preventive services including:    Health Maintenance reviewed - see .  Diet review for nutrition referral? Yes ____  Not Indicated ____   Patient Instructions (the written plan) was given to the patient.  Medicare Attestation I have personally reviewed: The patient's medical and social history Their use of alcohol, tobacco or illicit drugs Their current medications and supplements The patient's functional ability including ADLs,fall risks, home safety risks, cognitive, and hearing and visual impairment Diet and physical activities Evidence for depression or mood disorders  The patient's weight, height, BMI, and visual acuity have been recorded in the chart.  I have made referrals, counseling, and provided education to the patient based on review of the above and I have provided the patient with a written personalized care plan for preventive services.     Norberto Sorenson, MD   09/04/2016      Orders Placed This Encounter  Procedures  . MM SCREENING BREAST TOMO BILATERAL    PF: 06/28/13  Bcg//no hx of breast ca//no needs// no disabilities//medicare//mh//pt//TOMO    Standing Status:   Future    Number of Occurrences:   1    Standing Expiration Date:   11/05/2017    Order Specific Question:   Reason for Exam (SYMPTOM  OR DIAGNOSIS REQUIRED)    Answer:   annual     Order Specific Question:   Preferred imaging location?    Answer:   [facility]  . Pneumococcal conjugate vaccine 13-valent IM  . Comprehensive metabolic panel  . TSH  . HCV Ab w/Rflx to Verification  . Interpretation:  . Care order/instruction:    AVS - please print after vaccine is given and then pt can go  . POCT urinalysis dipstick    Meds ordered this encounter  Medications  . Zoster Vaccine Live, PF, (ZOSTAVAX) 16109 UNT/0.65ML injection    Sig: Inject 19,400 Units into the skin once.    Dispense:  1 vial    Refill:  0  . DULoxetine (CYMBALTA) 30 MG capsule    Sig: TAKE 1 CAPSULE BY MOUTH EVERY DAY    Dispense:  90 capsule    Refill:  3  . levothyroxine (SYNTHROID, LEVOTHROID) 175 MCG tablet    Sig: Take 1 tablet (175 mcg total) by mouth daily before breakfast.    Dispense:  90 tablet    Refill:  0    Norberto Sorenson, M.D.  [facility]  [place] [phone]  [address] [place], [place] [address] [phone] phone [phone] fax  10/15/16 3:20 AM  Results for orders placed or performed in visit on 09/04/16  Comprehensive metabolic panel  Result Value Ref Range   Glucose 81 65 - 99 mg/dL   BUN 14 8 - 27 mg/dL   Creatinine, Ser 4.130.75 0.57 - 1.00 mg/dL   GFR calc non Af Amer 81 >59 mL/min/1.73   GFR calc Af Amer 93 >59 mL/min/1.73   BUN/Creatinine Ratio 19 12 - 28   Sodium 140 134 - 144 mmol/L   Potassium 4.5 3.5 - 5.2 mmol/L   Chloride 96 96 - 106 mmol/L   CO2 29 18 - 29 mmol/L   Calcium 9.6 8.7 - 10.3 mg/dL   Total Protein 7.5 6.0 - 8.5 g/dL   Albumin 4.4 3.5 - 4.8 g/dL   Globulin, Total 3.1 1.5 - 4.5 g/dL   Albumin/Globulin Ratio 1.4 1.2 - 2.2   Bilirubin Total 0.4 0.0 - 1.2 mg/dL   Alkaline Phosphatase 91 39 - 117 IU/L   AST 21 0 - 40 IU/L   ALT 22 0 - 32 IU/L  TSH  Result Value Ref Range   TSH 6.540 (H) 0.450 - 4.500 uIU/mL  HCV Ab w/Rflx to Verification  Result Value Ref Range   HCV Ab <0.1 0.0 - 0.9 s/co ratio  Interpretation:   Result Value Ref Range   HCV interp 1: Comment

## 2016-09-05 LAB — COMPREHENSIVE METABOLIC PANEL
A/G RATIO: 1.4 (ref 1.2–2.2)
ALBUMIN: 4.4 g/dL (ref 3.5–4.8)
ALK PHOS: 91 IU/L (ref 39–117)
ALT: 22 IU/L (ref 0–32)
AST: 21 IU/L (ref 0–40)
BILIRUBIN TOTAL: 0.4 mg/dL (ref 0.0–1.2)
BUN / CREAT RATIO: 19 (ref 12–28)
BUN: 14 mg/dL (ref 8–27)
CHLORIDE: 96 mmol/L (ref 96–106)
CO2: 29 mmol/L (ref 18–29)
Calcium: 9.6 mg/dL (ref 8.7–10.3)
Creatinine, Ser: 0.75 mg/dL (ref 0.57–1.00)
GFR calc Af Amer: 93 mL/min/{1.73_m2} (ref >59)
GFR calc non Af Amer: 81 mL/min/{1.73_m2} (ref >59)
Globulin, Total: 3.1 g/dL (ref 1.5–4.5)
Glucose: 81 mg/dL (ref 65–99)
POTASSIUM: 4.5 mmol/L (ref 3.5–5.2)
Sodium: 140 mmol/L (ref 134–144)
TOTAL PROTEIN: 7.5 g/dL (ref 6.0–8.5)

## 2016-09-05 LAB — TSH: TSH: 6.54 u[IU]/mL — ABNORMAL HIGH (ref 0.450–4.500)

## 2016-09-05 LAB — HCV AB W/RFLX TO VERIFICATION: HCV Ab: <0.1 s/co ratio (ref 0.0–0.9)

## 2016-09-05 LAB — HCV INTERPRETATION

## 2016-09-07 MED ORDER — LEVOTHYROXINE SODIUM 175 MCG PO TABS: 175.0000 ug | ORAL_TABLET | Freq: Every day | ORAL | 0 refills | Status: DC

## 2016-09-14 ENCOUNTER — Encounter: Payer: Self-pay | Admitting: Family Medicine

## 2016-10-14 ENCOUNTER — Ambulatory Visit
Admission: RE | Admit: 2016-10-14 | Discharge: 2016-10-14 | Disposition: A | Discharge status: Home / Self Care | Payer: Medicare Other | Source: Ambulatory Visit | Attending: Family Medicine | Admitting: Family Medicine

## 2016-10-14 DIAGNOSIS — Z0000 Encounter for general adult medical examination without abnormal findings: Secondary | ICD-10-CM

## 2016-10-14 DIAGNOSIS — Z1231 Encounter for screening mammogram for malignant neoplasm of breast: Secondary | ICD-10-CM

## 2016-10-18 ENCOUNTER — Telehealth: Payer: Self-pay

## 2016-10-18 DIAGNOSIS — E039 Hypothyroidism, unspecified: Secondary | ICD-10-CM

## 2016-10-18 NOTE — Telephone Encounter (Signed)
Patient would like to have a nurse call her to discuss her new medication that she was put on levothyroxine (SYNTHROID, LEVOTHROID) 175 MCG tablet, she states she thinks it is making her tired and more sleppy and wants to know what she should do. She is a patient of Dr Clelia CroftSHAW and her call back number is [phone]

## 2016-10-22 NOTE — Telephone Encounter (Signed)
I would like her to drop by the clinic for a brief lab only visit.  Orders for future thyroid panel have been entered.  As soon as she has a drawn she can drop back down to her prior 150 dose if she would like and hopefully her symptoms will resolve.   If her fatigue does not get better she should come in for a visit.  If it does get better we'll plan to keep her on the 150.  However since she has been on the 175 dose for 6 weeks now I would really like to get an idea of what her thyroid levels are while she is still on it.

## 2016-10-25 ENCOUNTER — Other Ambulatory Visit: Payer: Medicare Other

## 2016-10-25 DIAGNOSIS — E039 Hypothyroidism, unspecified: Secondary | ICD-10-CM

## 2016-10-25 MED ORDER — LEVOTHYROXINE SODIUM 150 MCG PO TABS: 150.0000 ug | ORAL_TABLET | Freq: Every day | ORAL | 1 refills | Status: DC

## 2016-10-25 NOTE — Telephone Encounter (Addendum)
I have called her to advise. Her mailbox is full. I have sent her a my chart message also to let her know to come in for lab only   She states she is coming in today. She states she has not increased the dose of Synthroid as you directed.  She has been using the previous dose.   To you FYI

## 2016-10-25 NOTE — Telephone Encounter (Addendum)
If she has switched back to her prior dose, that makes the blood she gave today totally useless, there is no way to interpret the results to see what her body needs so I cancelled the lab order and she can just stay on the 150. Sent in to pharmacy.

## 2016-10-26 LAB — THYROID PANEL WITH TSH
FREE THYROXINE INDEX: 2.9 (ref 1.2–4.9)
T3 UPTAKE RATIO: 30 % (ref 24–39)
T4 TOTAL: 9.8 ug/dL (ref 4.5–12.0)
TSH: 2.43 u[IU]/mL (ref 0.450–4.500)

## 2016-10-27 ENCOUNTER — Encounter: Payer: Self-pay | Admitting: Family Medicine

## 2016-10-27 ENCOUNTER — Ambulatory Visit (INDEPENDENT_AMBULATORY_CARE_PROVIDER_SITE_OTHER): Payer: Medicare Other | Admitting: Family Medicine

## 2016-10-27 VITALS — BP 151/78 | HR 62 | Temp 97.9°F | Resp 16 | Ht 65.75 in | Wt 211.0 lb

## 2016-10-27 DIAGNOSIS — R030 Elevated blood-pressure reading, without diagnosis of hypertension: Secondary | ICD-10-CM

## 2016-10-27 DIAGNOSIS — J019 Acute sinusitis, unspecified: Secondary | ICD-10-CM | POA: Diagnosis not present

## 2016-10-27 DIAGNOSIS — H6121 Impacted cerumen, right ear: Secondary | ICD-10-CM | POA: Diagnosis not present

## 2016-10-27 MED ORDER — CEFDINIR 300 MG PO CAPS
300.0000 mg | ORAL_CAPSULE | Freq: Two times a day (BID) | ORAL | 0 refills | Status: DC
Start: 2016-10-27 — End: 2017-10-21

## 2016-10-27 NOTE — Telephone Encounter (Signed)
Spoke with pt. Pt advised that, if she was taking her previous dose when her labs were drawn, then the labs would not offer any useful information. Pt was advised that said labs were cancelled and that she should stay on the 150 for the time being. Pt advised the Rx was sent to her pharmacy. Pt states that she understands. Pt did not have any further questions.

## 2016-10-27 NOTE — Telephone Encounter (Signed)
Please give the patient the message from Dr. Clelia CroftShaw. If unable to leave a message, send My Chart message.

## 2016-10-27 NOTE — Patient Instructions (Signed)
Managing Your Hypertension Hypertension is commonly called high blood pressure. Blood pressure is a measurement of how strongly your blood is pressing against the walls of your arteries. Arteries are blood vessels that carry blood from your heart throughout your body. Blood pressure does not stay the same. It rises when you are active, excited, or nervous. It lowers when you are sleeping or relaxed. If the numbers that measure your blood pressure stay above normal most of the time, you are at risk for health problems. Hypertension is a long-term (chronic) condition in which blood pressure is elevated. This condition often has no signs or symptoms. The cause of the condition is usually not known. What are blood pressure readings? A blood pressure reading is recorded as two numbers, such as "120 over 80" (or 120/80). The first ("top") number is called the systolic pressure. It is a measure of the pressure in your arteries as the heart beats. The second ("bottom") number is called the diastolic pressure. It is a measure of the pressure in your arteries as the heart relaxes between beats. What does my blood pressure reading mean? Blood pressure is classified into four stages. Based on your blood pressure reading, your health care provider may use the following stages to determine what type of treatment, if any, is needed. Systolic pressure and diastolic pressure are measured in a unit called mm Hg. Normal  Systolic pressure: below 120.  Diastolic pressure: below 80. Prehypertension  Systolic pressure: 120-139.  Diastolic pressure: 80-89. Hypertension stage 1  Systolic pressure: 140-159.  Diastolic pressure: 90-99. Hypertension stage 2  Systolic pressure: 160 or above.  Diastolic pressure: 100 or above. What health risks are associated with hypertension? Managing your hypertension is an important responsibility. Uncontrolled hypertension can lead to:  A heart attack.  A stroke.  A  weakened blood vessel (aneurysm).  Heart failure.  Kidney damage.  Eye damage.  Metabolic syndrome.  Memory and concentration problems. What changes can I make to manage my hypertension? Hypertension can be managed effectively by making lifestyle changes and possibly by taking medicines. Your health care provider will help you come up with a plan to bring your blood pressure within a normal range. Your plan should include the following: Monitoring  Monitor your blood pressure at home as told by your health care provider. Your personal target blood pressure may vary depending on your medical conditions, your age, and other factors.  Have your blood pressure rechecked as told by your health care provider. Lifestyle  Lose weight if necessary.  Get at least 30-45 minutes of aerobic exercise at least 4 times a week.  Do not use any products that contain nicotine or tobacco, such as cigarettes and e-cigarettes. If you need help quitting, ask your health care provider.  Learn ways to reduce stress.  Control any chronic conditions, such as high cholesterol or diabetes. Eating and drinking  Follow the DASH diet. This diet is high in fruits, vegetables, and whole grains. It is low in salt, red meat, and added sugars.  Keep your sodium intake below 2,300 mg per day.  Limit alcoholic beverages. Communication  Review all the medicines you take with your health care provider because there may be side effects or interactions.  Talk with your health care provider about your diet, exercise habits, and other lifestyle factors that may be contributing to hypertension.  See your health care provider regularly. Your health care provider can help you create and adjust your plan for managing hypertension. Will   I need medicine to control my blood pressure? Your health care provider may prescribe medicine if lifestyle changes are not enough to get your blood pressure under control, and if one of  the following is true:  You are 18-59 years of age, and your systolic blood pressure is 140 or higher.  You are 60 years of age or older, and your systolic blood pressure is 150 or higher.  Your diastolic blood pressure is 90 or higher.  You have diabetes, and your systolic blood pressure is over 140 or your diastolic blood pressure is over 90.  You have kidney disease, and your blood pressure is above 140/90.  You have heart disease or a history of stroke, and your blood pressure is 140/90 or higher. Take medicines only as told by your health care provider. Follow the directions carefully. Blood pressure medicines must be taken as prescribed. The medicine does not work as well when you skip doses. Skipping doses also puts you at risk for problems. Contact a health care provider if:  You think you are having a reaction to medicines you have taken.  You have repeated (recurrent) headaches.  You feel dizzy.  You have swelling in your ankles.  You have trouble with your vision. Get help right away if:  You develop a severe headache or confusion.  You have unusual weakness or numbness, or you feel faint.  You have severe pain in your chest or abdomen.  You vomit repeatedly.  You have trouble breathing. This information is not intended to replace advice given to you by your health care provider. Make sure you discuss any questions you have with your health care provider. Document Released: 06/09/2012 Document Revised: 05/20/2016 Document Reviewed: 12/14/2015 Elsevier Interactive Patient Education  2017 [employer]  

## 2016-10-27 NOTE — Progress Notes (Signed)
Subjective:    Patient ID: Elizabeth Lin, female    DOB: [birth year], 71 y.o.   MRN: [id number]  HPI  Squishy feeling when moving head - similar to prior ETD dysfuncitonl Using swimmers eear.  Gets HAs when laying down though better when using drops.  No f/c. Zyrtec, rhinocort x 1 mo,  Just all the pressure.  Was treated with zpack in Sept. And January - sxs of cough have now completely resolved. Decreased hearing in right. gradualyl getting owrse. HA when laying flat.  She never went up to the 175 - she just stayed on the 150 due to th/o palp but feeling tired, DOE, exercising less, gaining weight.   She was seen at [facility] on Sat and BP was up then to at 160.  Past Medical History:  Diagnosis Date  . Allergy   . Anxiety   . Arthritis   . Cataract   . Depression   . Hammer toe   . Thyroid disease    Past Surgical History:  Procedure Laterality Date  . APPENDECTOMY    . CYSTECTOMY     eye  . OOPHORECTOMY Right   . TONSILLECTOMY     Current Outpatient Prescriptions on File Prior to Visit  Medication Sig Dispense Refill  . cetirizine (ZYRTEC) 10 MG tablet Take 10 mg by mouth daily.    . DULoxetine (CYMBALTA) 30 MG capsule TAKE 1 CAPSULE BY MOUTH EVERY DAY 90 capsule 3  . levothyroxine (SYNTHROID, LEVOTHROID) 150 MCG tablet Take 1 tablet (150 mcg total) by mouth daily before breakfast. 90 tablet 1  . Calcium-Vitamin D-Vitamin K (CALCIUM + D + K PO) Take 1,500 mg by mouth daily. 1500 mg+D1000 IU    . fish oil-omega-3 fatty acids 1000 MG capsule Take 2 g by mouth daily.    . Multiple Vitamins-Minerals (MULTIVITAMIN WITH MINERALS) tablet Take 1 tablet by mouth daily.    Marland Kitchen OVER THE COUNTER MEDICATION OTC Lutigold extra taking one daily    . OVER THE COUNTER MEDICATION OTC Glucosamine Chondroitin taking     No current facility-administered medications on file prior to visit.    Allergies  Allergen Reactions  . Amoxicillin-Pot Clavulanate     Red rash   Family History    Problem Relation Age of Onset  . Diabetes Father   . Myasthenia gravis Father   . Heart disease Father   . Thyroid disease Mother   . Cancer Mother   . Depression Mother   . Mental illness Mother   . Hypertension Brother   . Hyperlipidemia Brother   . Thyroid disease Daughter   . Depression Son   . Heart disease Maternal Grandmother   . Heart disease Paternal Grandmother   . Heart disease Paternal Grandfather    Social History   Social History  . Marital status: Single    Spouse name: N/A  . Number of children: 3  . Years of education: college   Occupational History  . 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 Smoking status: Former Smoker    Types: Cigarettes    Quit date: 11/01/1974  . Smokeless tobacco: Former Neurosurgeon    Quit date: 06/11/1982  . Alcohol use No     Comment: occasional wine  . Drug use: No  . Sexual activity: No   Other Topics Concern  . None   Social History Narrative   Patient is Divorced and lives with grandson.  Patient's  Education: [employer].    Patient is right handed   Caffeine consumption 2-3 daily         [facility] 10/31/2016 10/27/2016 09/04/2016 06/17/2016 01/14/2016  Decreased Interest 0 0 0 0 0  Down, Depressed, Hopeless 0 0 0 0 0  PHQ - 2 Score 0 0 0 0 0  Altered sleeping - - - - -  Tired, decreased energy - - - - -  Change in appetite - - - - -  Feeling bad or failure about yourself  - - - - -  Trouble concentrating - - - - -  Moving slowly or fidgety/restless - - - - -  Suicidal thoughts - - - - -  PHQ-9 Score - - - - -  Difficult doing work/chores - - - - -    Review of Systems See hpi    Objective:   Physical Exam  Constitutional: She is oriented to person, place, and time. She appears well-developed and well-nourished. She appears lethargic. She appears ill. No distress.  HENT:  Head: Normocephalic and atraumatic.  Right Ear: External ear and ear canal normal. Tympanic membrane  is retracted. A middle ear effusion is present.  Left Ear: External ear and ear canal normal. Tympanic membrane is retracted. A middle ear effusion is present.  Nose: Mucosal edema and rhinorrhea present. Right sinus exhibits maxillary sinus tenderness. Left sinus exhibits maxillary sinus tenderness.  Mouth/Throat: Uvula is midline and mucous membranes are normal. Posterior oropharyngeal erythema present. No oropharyngeal exudate, posterior oropharyngeal edema or tonsillar abscesses.  Eyes: Conjunctivae are normal. Right eye exhibits no discharge. Left eye exhibits no discharge. No scleral icterus.  Neck: Normal range of motion. Neck supple.  Cardiovascular: Normal rate, regular rhythm, normal heart sounds and intact distal pulses.   Pulmonary/Chest: Effort normal and breath sounds normal.  Lymphadenopathy:       Head (right side): Submandibular adenopathy present. No preauricular and no posterior auricular adenopathy present.       Head (left side): Submandibular adenopathy present. No preauricular and no posterior auricular adenopathy present.    She has no cervical adenopathy.       Right: No supraclavicular adenopathy present.       Left: No supraclavicular adenopathy present.  Neurological: She is oriented to person, place, and time. She appears lethargic.  Skin: Skin is warm and dry. She is not diaphoretic. No erythema.  Psychiatric: She has a normal mood and affect. Her behavior is normal.      BP (!) 151/78 (BP Location: Right Arm, Patient Position: Sitting, Cuff Size: Large)   Pulse 62   Temp 97.9 F (36.6 C) (Oral)   Resp 16   Ht 5' 5.75" (1.67 m)   Wt 211 lb (95.7 kg)   SpO2 97%   BMI 34.32 kg/m      Assessment & Plan:   1. Transient elevated blood pressure   2. Acute sinusitis, recurrence not specified, unspecified location   3. Impacted cerumen of right ear     Meds ordered this encounter  Medications  . neomycin-polymyxin-hydrocortisone (CORTISPORIN) otic  solution    Sig: Place 2 drops into both ears 4 (four) times daily.  . cefdinir (OMNICEF) 300 MG capsule    Sig: Take 1 capsule (300 mg total) by mouth 2 (two) times daily.    Dispense:  14 capsule    Refill:  0      Norberto SorensonEva Niylah Hassan, M.D.  [facility]  [facility] [address] [place], [place] [address] [phone] phone [phone] fax  11/16/16 3:18 AM

## 2016-10-30 HISTORY — PX: TRANSTHORACIC ECHOCARDIOGRAM: SHX275

## 2016-10-31 ENCOUNTER — Ambulatory Visit (INDEPENDENT_AMBULATORY_CARE_PROVIDER_SITE_OTHER): Payer: Medicare Other | Admitting: Physician Assistant

## 2016-10-31 VITALS — BP 136/82 | HR 60 | Temp 97.9°F | Resp 18 | Ht 65.75 in | Wt 215.0 lb

## 2016-10-31 DIAGNOSIS — H938X1 Other specified disorders of right ear: Secondary | ICD-10-CM

## 2016-10-31 DIAGNOSIS — H6991 Unspecified Eustachian tube disorder, right ear: Secondary | ICD-10-CM

## 2016-10-31 DIAGNOSIS — H6981 Other specified disorders of Eustachian tube, right ear: Secondary | ICD-10-CM

## 2016-10-31 MED ORDER — PREDNISONE 10 MG PO TABS: ORAL_TABLET | ORAL | 0 refills | Status: DC

## 2016-10-31 NOTE — Progress Notes (Signed)
Elizabeth Lin  MRN: [id number] DOB: [birth year]  PCP: Norberto SorensonSHAW,EVA, MD  Subjective:  Pt is a 71 year old female who presents to clinic for ear problem. She was here four days ago for the same. Treated for sinus infection with Cefdinir. She has three more days left of this medication. She reports loose stools on this medication.  Recently treated for external ear canal infection. Today is her last day taking ear drops.   Headache has improved. However c/o ear still feeling "full" and "squishy". If she opens her mouth, it'll clear up some. She is using Zyrtec 10mg . She is using Flonase in the morning.   This is a recurring problem, Has an ENT, Narda Bondshris Newman, for this. Has not been in several years.  Denies dizziness, tinnitus, problem with balance, headache, fever, chills, ear drainage, ear pain.   Review of Systems  Constitutional: Negative for chills, diaphoresis, fatigue and fever.  HENT: Negative for congestion, ear discharge, ear pain, hearing loss, postnasal drip, rhinorrhea, sinus pain, sinus pressure, sore throat, tinnitus and trouble swallowing.   Eyes: Negative for photophobia, pain, redness and visual disturbance.  Respiratory: Negative for cough, chest tightness, shortness of breath and wheezing.   Cardiovascular: Negative for chest pain and palpitations.  Gastrointestinal: Negative for abdominal pain, diarrhea, nausea and vomiting.  Neurological: Negative for dizziness, weakness, light-headedness and headaches.    Patient Active Problem List   Diagnosis Date Noted  . Vitamin D deficiency 09/06/2014  . Right knee pain 02/24/2013  . Hypothyroid 11/02/2011  . Depression 11/02/2011  . 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 Calcium-Vitamin D-Vitamin K (CALCIUM + D + K PO) Take 1,500 mg by mouth daily. 1500 mg+D1000 IU    . cefdinir (OMNICEF) 300 MG capsule Take 1 capsule (300 mg total)  by mouth 2 (two) times daily. 14 capsule 0  . cetirizine (ZYRTEC) 10 MG tablet Take 10 mg by mouth daily.    . DULoxetine (CYMBALTA) 30 MG capsule TAKE 1 CAPSULE BY MOUTH EVERY DAY 90 capsule 3  . fish oil-omega-3 fatty acids 1000 MG capsule Take 2 g by mouth daily.    Marland Kitchen. levothyroxine (SYNTHROID, LEVOTHROID) 150 MCG tablet Take 1 tablet (150 mcg total) by mouth daily before breakfast. 90 tablet 1  . Multiple Vitamins-Minerals (MULTIVITAMIN WITH MINERALS) tablet Take 1 tablet by mouth daily.    Marland Kitchen. neomycin-polymyxin-hydrocortisone (CORTISPORIN) otic solution Place 2 drops into both ears 4 (four) times daily.    Marland Kitchen. OVER THE COUNTER MEDICATION OTC Lutigold extra taking one daily    . OVER THE COUNTER MEDICATION OTC Glucosamine Chondroitin taking     No current facility-administered medications on file prior to visit.     Allergies  Allergen Reactions  . Amoxicillin-Pot Clavulanate     Red rash     Objective:  BP 136/82 (BP Location: Right Arm, Patient Position: Sitting, Cuff Size: Small)   Pulse 60   Temp 97.9 F (36.6 C) (Oral)   Resp 18   Ht 5' 5.75" (1.67 m)   Wt 215 lb (97.5 kg)   SpO2 99%   BMI 34.97 kg/m   Physical Exam  Constitutional: She is oriented to person, place, and time and well-developed, well-nourished, and in no distress. No distress.  HENT:  Left Ear: Tympanic membrane, external ear and ear canal normal.  TM not visualized, pt is tender with ear probe insertion.  Cardiovascular: Normal rate, regular rhythm and normal heart sounds.   Neurological: She is alert and oriented to person, place, and time. GCS score is 15.  Skin: Skin is warm and dry.  Psychiatric: Mood, memory, affect and judgment normal.  Vitals reviewed.   Assessment and Plan :  1. Sensation of fullness in right ear 2. Dysfunction of right eustachian tube - predniSONE (DELTASONE) 10 MG tablet; Take 3 PO QAM x2days, 2 PO QAM x2days, 1 PO QAM x2days  Dispense: 12 tablet; Refill: 0 - This is her  third round of treatment for right ear problem. Encouraged pt to schedule appt with her ENT if no improvement after course of steroids. She understands and agrees.   Marco Collie, PA-C  [facility] 10/31/2016 6:31 PM

## 2016-10-31 NOTE — Patient Instructions (Addendum)
Start taking this medication after you finish your current course of antibiotics.   Please make an appointment with your ENT. Call or return if you need anything/have questions.   Thank you for coming in today. I hope you feel we met your needs.  Feel free to call [facility] if you have any questions or further requests.  Please consider signing up for MyChart if you do not already have it, as this is a great way to communicate with me.  Best,  Whitney McVey, PA-C   IF you received an x-ray today, you will receive an invoice from [facility]. Please contact [facility] at [phone] with questions or concerns regarding your invoice.   IF you received labwork today, you will receive an invoice from [facility]. Please contact [facility] at [phone] with questions or concerns regarding your invoice.   Our billing staff will not be able to assist you with questions regarding bills from these companies.  You will be contacted with the lab results as soon as they are available. The fastest way to get your results is to activate your My Chart account. Instructions are located on the last page of this paperwork. If you have not heard from [place] regarding the results in 2 weeks, please contact this office.

## 2017-05-11 ENCOUNTER — Other Ambulatory Visit: Payer: Self-pay | Admitting: Family Medicine

## 2017-05-11 DIAGNOSIS — E039 Hypothyroidism, unspecified: Secondary | ICD-10-CM

## 2017-08-08 ENCOUNTER — Other Ambulatory Visit: Payer: Self-pay | Admitting: Family Medicine

## 2017-08-08 DIAGNOSIS — E039 Hypothyroidism, unspecified: Secondary | ICD-10-CM

## 2017-08-10 ENCOUNTER — Other Ambulatory Visit: Payer: Self-pay | Admitting: Family Medicine

## 2017-08-10 DIAGNOSIS — E039 Hypothyroidism, unspecified: Secondary | ICD-10-CM

## 2017-10-21 ENCOUNTER — Ambulatory Visit: Payer: Medicare Other

## 2017-10-21 ENCOUNTER — Ambulatory Visit (INDEPENDENT_AMBULATORY_CARE_PROVIDER_SITE_OTHER): Payer: Medicare Other | Admitting: Family Medicine

## 2017-10-21 ENCOUNTER — Encounter: Payer: Self-pay | Admitting: Family Medicine

## 2017-10-21 VITALS — BP 110/70 | HR 67 | Temp 98.5°F | Resp 18 | Ht 65.75 in | Wt 198.6 lb

## 2017-10-21 DIAGNOSIS — Z1389 Encounter for screening for other disorder: Secondary | ICD-10-CM

## 2017-10-21 DIAGNOSIS — M2550 Pain in unspecified joint: Secondary | ICD-10-CM

## 2017-10-21 DIAGNOSIS — Z6832 Body mass index (BMI) 32.0-32.9, adult: Secondary | ICD-10-CM | POA: Diagnosis not present

## 2017-10-21 DIAGNOSIS — E6609 Other obesity due to excess calories: Secondary | ICD-10-CM

## 2017-10-21 DIAGNOSIS — Z0000 Encounter for general adult medical examination without abnormal findings: Secondary | ICD-10-CM

## 2017-10-21 DIAGNOSIS — F3342 Major depressive disorder, recurrent, in full remission: Secondary | ICD-10-CM | POA: Diagnosis not present

## 2017-10-21 DIAGNOSIS — E039 Hypothyroidism, unspecified: Secondary | ICD-10-CM

## 2017-10-21 DIAGNOSIS — Z1383 Encounter for screening for respiratory disorder NEC: Secondary | ICD-10-CM

## 2017-10-21 DIAGNOSIS — Z136 Encounter for screening for cardiovascular disorders: Secondary | ICD-10-CM

## 2017-10-21 DIAGNOSIS — I4510 Unspecified right bundle-branch block: Secondary | ICD-10-CM | POA: Diagnosis not present

## 2017-10-21 DIAGNOSIS — E7800 Pure hypercholesterolemia, unspecified: Secondary | ICD-10-CM

## 2017-10-21 DIAGNOSIS — R030 Elevated blood-pressure reading, without diagnosis of hypertension: Secondary | ICD-10-CM | POA: Diagnosis not present

## 2017-10-21 DIAGNOSIS — E559 Vitamin D deficiency, unspecified: Secondary | ICD-10-CM

## 2017-10-21 LAB — POCT URINALYSIS DIP (MANUAL ENTRY)
BILIRUBIN UA: NEGATIVE
BILIRUBIN UA: NEGATIVE mg/dL
GLUCOSE UA: NEGATIVE mg/dL
Nitrite, UA: NEGATIVE
PH UA: 6 (ref 5.0–8.0)
SPEC GRAV UA: 1.02 (ref 1.010–1.025)
Urobilinogen, UA: 0.2 E.U./dL

## 2017-10-21 LAB — POCT SEDIMENTATION RATE: POCT SED RATE: 28 mm/h — AB (ref 0–22)

## 2017-10-21 MED ORDER — DULOXETINE HCL 30 MG PO CPEP: ORAL_CAPSULE | ORAL | 3 refills | Status: DC

## 2017-10-21 MED ORDER — DICLOFENAC SODIUM 1 % TD GEL: 2.0000 g | Freq: Four times a day (QID) | TRANSDERMAL | 5 refills | Status: DC

## 2017-10-21 NOTE — Progress Notes (Signed)
Subjective:    Patient ID: Elizabeth Lin, female    DOB: 1946/08/24, 72 y.o.   MRN: [id number] Chief Complaint  Patient presents with  . Annual Exam    no pap  . Medication Refill    Levothyroxine and Cymbalta  . joint pain    in wrist and ankles    HPI Primary Preventative Screenings: Cervical Cancer: NA due to age. Previously saw gyn Dr. Corinna Capra regularly with last pap smear normal in 2012. S/p Rt oophorectomy for ovarian cyst. STI screening: declines need Breast Cancer: Mammogram every 2 years last done 10/14/2016.  Colorectal Cancer: colonoscopy at [facility] Dr. Benson Norway but not sure.  Had colonoscopy with Dr. Benson Norway ~09/2013 and positive that she has had one since then, relatively recently, so was likely done again in 2018 with 3 yr recall. We only have initial c/s note where colonoscopy was discussed/arranged with Dr. Benson Norway 09/07/2013 - will get records to update chart.  Tobacco use/EtOH/substances:mother had lung cancer but smoked a lot - pt smoked for 10 years for 2-3 packs/d but stopped 40 years ago  Bone Density: mild ostepenia with -1.1 3 yrs ago, repeated last year through weight loss program around 08/2016 - does dexa 3x in 2 years with this weight loss study she is in and was told her dexa scan was fine.  Cardiac: EKG  2014 sinus brady with borderline 1st degree AV block. QT 450, QTc 433. Repeat today now shows a RBBB - looks like it might have been starting to develop prior but is definitely changed. Weight/Blood sugar/Diet/Exercise: doing more veggies, fish, beans, cheese.  3K-6K steps/d.  BMI Readings from Last 3 Encounters:  10/21/17 32.30 kg/m  10/31/16 34.97 kg/m  10/27/16 34.32 kg/m   Lab Results  Component Value Date   HGBA1C 5.3 09/01/2014   OTC/Vit/Supp/Herbal:lutigold to prevent glucoma, otc calcium/vitamin D, omega-3.  Dentist/Optho: Saw optho 3 yrs ago beginning cataracts. Immunizations:  Immunization History  Administered Date(s) Administered  .  Influenza, Seasonal, Injecte, Preservative Fre 11/05/2012  . Influenza,inj,Quad PF,6+ Mos 06/21/2013, 09/01/2014  . Influenza-Unspecified 06/21/2016, 06/15/2017  . Pneumococcal Conjugate-13 09/04/2016  . Tdap 03/16/2012   HCPOA is her son - MD in [place], [place].  Her daughter is here in [place].  She had rubella late in her pregnanacy with her daughter so her daughter as a number of different disabilities and lives in a group home. However, just had a surgery so has to go to an assisted living for a while - pt is trying to move her over there which has been physically stressful on her body.    Chronic Medical Conditions: Hypothyroidism: On levothyroxine 150, dose stable for many years. last checked 1 yr prior Depression: cymbalta 30 has been stable and doing well. Notes she is sleeping more which is odd - not necessarily good or bad. Vit D def: Joint pain: worsening and in more joints.  Rt foot >Lt with ankle and top of foot is more sensitive which is new but is not really abnormal for her as legs/calves always bigger on Rt her whole life. Does get a little better at night. Left thumb is bothering her though right handed - does wear ace bandage on Left wrist and Rt ankle sometimes which helps.  Taking calcium and omegas.  She was taking chondrotion last year but stopped when she switched over to an omega-3 as her anti-inflammatory instead. No red or hot joints but does get swelling. Pain is intermittent.  Mild pedal edema: She has liberalized her salt in take recently with less veggies and she has noted a huge difference with fluid retention with that - she is going to restart on the low salt diet.  Declines need for diuretic at this time  Occ gets pins/needles in her arm when she is still for along time on her right arm and leg.   She did complete the [facility] weight loss program which she learned a lot from and went down to 194 lbs - lost 20 lbs - now gained back 4 lbs.  Finished in September  after 9 mos but she still has anoher year of access to them.   Does go to restroom 1-2x/overnight   Past Medical History:  Diagnosis Date  . Allergy   . Anxiety   . Arthritis   . Cataract   . Depression   . Hammer toe   . Thyroid disease    Past Surgical History:  Procedure Laterality Date  . APPENDECTOMY    . CYSTECTOMY     eye  . OOPHORECTOMY Right   . TONSILLECTOMY     Current Outpatient Medications on File Prior to Visit  Medication Sig Dispense Refill  . Calcium-Vitamin D-Vitamin K (CALCIUM + D + K PO) Take 1,500 mg by mouth daily. 1500 mg+D1000 IU    . DULoxetine (CYMBALTA) 30 MG capsule TAKE 1 CAPSULE BY MOUTH EVERY DAY 90 capsule 3  . fish oil-omega-3 fatty acids 1000 MG capsule Take 2 g by mouth daily.    Marland Kitchen levothyroxine (SYNTHROID, LEVOTHROID) 150 MCG tablet TAKE 1 TABLET BY MOUTH EVERY DAY 90 tablet 0  . Multiple Vitamins-Minerals (MULTIVITAMIN WITH MINERALS) tablet Take 1 tablet by mouth daily.     No current facility-administered medications on file prior to visit.    Allergies  Allergen Reactions  . Amoxicillin-Pot Clavulanate     Red rash   Family History  Problem Relation Age of Onset  . Diabetes Father   . Myasthenia gravis Father   . Heart disease Father   . Thyroid disease Mother   . Cancer Mother   . Depression Mother   . Mental illness Mother   . Hypertension Brother   . Hyperlipidemia Brother   . Thyroid disease Daughter   . Depression Son   . Heart disease Maternal Grandmother   . Heart disease Paternal Grandmother   . Heart disease Paternal Grandfather    Social History   Socioeconomic History  . Marital status: Single    Spouse name: None  . Number of children: 3  . Years of education: college  . Highest education level: None  Social Needs  . Financial resource strain: None  . Food insecurity - worry: None  . Food insecurity - inability: None  . Transportation needs - medical: None  . Transportation needs - non-medical: None    Occupational History  . 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 Occupation: retired  Tobacco Use  . Smoking status: Former Smoker    Types: Cigarettes    Last attempt to quit: 11/01/1974    Years since quitting: 43.0  . Smokeless tobacco: Former Systems developer    Quit date: 06/11/1982  Substance and Sexual Activity  . Alcohol use: No    Comment: occasional wine  . Drug use: No  . Sexual activity: No  Other Topics Concern  . None  Social History Narrative   Patient is Divorced and lives with grandson.   Patient's  Education: [employer].  Patient is right handed   Caffeine consumption 2-3 daily         Depression screen [facility] 2/9 10/31/2016 10/27/2016 09/04/2016 06/17/2016 01/14/2016  Decreased Interest 0 0 0 0 0  Down, Depressed, Hopeless 0 0 0 0 0  PHQ - 2 Score 0 0 0 0 0  Altered sleeping - - - - -  Tired, decreased energy - - - - -  Change in appetite - - - - -  Feeling bad or failure about yourself  - - - - -  Trouble concentrating - - - - -  Moving slowly or fidgety/restless - - - - -  Suicidal thoughts - - - - -  PHQ-9 Score - - - - -  Difficult doing work/chores - - - - -    Review of Systems  Cardiovascular: Positive for leg swelling (lifelong big legs).  Gastrointestinal: Positive for abdominal pain (intermittent left side of abd ).  Musculoskeletal: Positive for arthralgias (Rt ankle, Lt wrist, Rt wrist), joint swelling (fingers) and myalgias (left upper arm). Negative for gait problem.  Allergic/Immunologic: Positive for environmental allergies.  Neurological: Positive for light-headedness (at times) and numbness (Rt arm at this partial).  All other systems reviewed and are negative.  Sleeping well    Objective:   Physical Exam  Constitutional: She is oriented to person, place, and time. She appears well-developed and well-nourished. No distress.  HENT:  Head: Normocephalic and atraumatic.  Right Ear: Tympanic membrane, external ear and ear canal normal.  Left Ear: Tympanic  membrane, external ear and ear canal normal.  Nose: Nose normal. No mucosal edema or rhinorrhea.  Mouth/Throat: Uvula is midline, oropharynx is clear and moist and mucous membranes are normal. No posterior oropharyngeal erythema.  Eyes: Conjunctivae and EOM are normal. Pupils are equal, round, and reactive to light. Right eye exhibits no discharge. Left eye exhibits no discharge. No scleral icterus.  Neck: Normal range of motion. Neck supple. No thyromegaly present.  Cardiovascular: Regular rhythm, normal heart sounds and intact distal pulses. Bradycardia present.  Pulses:      Radial pulses are 2+ on the right side, and 2+ on the left side.       Dorsalis pedis pulses are 2+ on the right side, and 2+ on the left side.       Posterior tibial pulses are 2+ on the right side, and 2+ on the left side.  Pulmonary/Chest: Effort normal and breath sounds normal. No respiratory distress.  Abdominal: Soft. Bowel sounds are normal. There is no tenderness.  Musculoskeletal: She exhibits no edema.       Right wrist: She exhibits normal range of motion, no tenderness, no bony tenderness, no swelling, no effusion, no crepitus and no deformity.       Left wrist: She exhibits normal range of motion, no tenderness, no bony tenderness, no swelling, no effusion, no crepitus and no deformity.  Bilateral lipedema with sparing of the feet. Trace pedal edema B.   Lymphadenopathy:    She has no cervical adenopathy.  Neurological: She is alert and oriented to person, place, and time. She has normal reflexes.  Skin: Skin is warm and dry. She is not diaphoretic. No erythema.  Psychiatric: She has a normal mood and affect. Her behavior is normal.         BP (!) 158/80   Pulse 67   Temp 98.5 F (36.9 C) (Oral)   Resp 18   Ht 5' 5.75" (1.67 m)  Wt 198 lb 9.6 oz (90.1 kg)   SpO2 95%   BMI 32.30 kg/m    Visual Acuity Screening   Right eye Left eye Both eyes  Without correction:     With correction: 20/20-1  20/25 20/20-1   EKG: Sinus bradycardia. Has developed a new RBBB when compared to prior EKG done 06/03/2013.   I have personally reviewed the EKG tracing and agree with the computer interpretation. Sinus  Bradycardia  -Right bundle branch block.   ABNORMAL   Assessment & Plan:    1. Annual physical exam   2. Hypothyroidism, unspecified type - tsh stable for many yrs on levothyroxine 150. Tried pt on 175 once to see if could help w/ weight loss but caused quickly caused palpitations so decreased back.  3. Screening for cardiovascular, respiratory, and genitourinary diseases   4. Vitamin D deficiency   5. Recurrent major depressive disorder, in full remission ([place]) - stable for years, cont cymbalta 30  6. Class 1 obesity due to excess calories without serious comorbidity with body mass index (BMI) of 32.0 to 32.9 in adult - participating in a 3 yrs weight loss study with frequent monitoring. Doing generally healthy lifestyle  7. Elevated blood pressure reading - start monitoring at home 1x/wk and RTC if seeing >140/90 some of the time. Will cut down the salt in her diet  8. Arthralgia of multiple joints -hx and exam c/w OA but inflammatory markers slightly elev prior and as mult joints worsening simultaneously, will recheck. cont ca/vit D and omega-3. Restart high dose glucosamine-chondroiton. Consider trial of circumin w/ black pepper. Try topical voltaren since her most painful joints are wrists, fingers, feet - areas w/o a lot of subcutaneous fat between skin and joint. VOLTAREN GEL NOT ON FORMULARY so didn't try. ESR 28 - wnml for 72 yo caucasian woman.  9.      New onset right bundle branch block (RBBB) - can see that in 2014 QT was fairly wide so likely slowly progressing overtime but needs repeat EKG and echo - will refer to cards for the eval 10.    Pure hypercholesterolemia - on omega-3s.  No cardiac risk factors so pt always worked on tlc with LDL 150-160s - will reassess goal and need  for trx after cards eval.  Orders Placed This Encounter  Procedures  . Comprehensive metabolic panel  . Lipid panel  . TSH  . T4, free  . VITAMIN D 25 Hydroxy (Vit-D Deficiency, Fractures)  . CBC  . Hemoglobin A1c  . POCT SEDIMENTATION RATE  . POCT urinalysis dipstick  . EKG 12-Lead    Meds ordered this encounter  Medications  . DULoxetine (CYMBALTA) 30 MG capsule    Sig: TAKE 1 CAPSULE BY MOUTH EVERY DAY    Dispense:  90 capsule    Refill:  3  . diclofenac sodium (VOLTAREN) 1 % GEL    Sig: Apply 2 g topically 4 (four) times daily. Each To left thumb and top of right foot    Dispense:  100 g    Refill:  5  . levothyroxine (SYNTHROID, LEVOTHROID) 150 MCG tablet    Sig: Take 1 tablet (150 mcg total) by mouth daily.    Dispense:  90 tablet    Refill:  3    I personally performed the services described in this documentation, which was scribed in my presence. The recorded information has been reviewed and considered, and addended by me as needed.   Delman Cheadle,  M.D.  Primary [facility] at [facility] [address] [place], [place] [address] [phone] phone [phone] fax  10/23/17 2:48 PM

## 2017-10-21 NOTE — Patient Instructions (Addendum)
Start high dose glucosamine-chondroiton supplement - more than '1800mg'$  of each. Consider adding in circumin (active ingredient in tumeric) that has a little black pepper added to make it active. Try voltaren gel to the joints as needed. If you continue to have pain, come back so we can consider xrays and if we need certain braces and/or exercises/strengthening or referral to sports med/ortho.  Check your blood pressure once a week at home and let me know if it is running >140/90 30% of the time or more.   Health Maintenance, Female Adopting a healthy lifestyle and getting preventive care can go a long way to promote health and wellness. Talk with your health care provider about what schedule of regular examinations is right for you. This is a good chance for you to check in with your provider about disease prevention and staying healthy. In between checkups, there are plenty of things you can do on your own. Experts have done a lot of research about which lifestyle changes and preventive measures are most likely to keep you healthy. Ask your health care provider for more information. Weight and diet Eat a healthy diet  Be sure to include plenty of vegetables, fruits, low-fat dairy products, and lean protein.  Do not eat a lot of foods high in solid fats, added sugars, or salt.  Get regular exercise. This is one of the most important things you can do for your health. ? Most adults should exercise for at least 150 minutes each week. The exercise should increase your heart rate and make you sweat (moderate-intensity exercise). ? Most adults should also do strengthening exercises at least twice a week. This is in addition to the moderate-intensity exercise.  Maintain a healthy weight  Body mass index (BMI) is a measurement that can be used to identify possible weight problems. It estimates body fat based on height and weight. Your health care provider can help determine your BMI and help you  achieve or maintain a healthy weight.  For females 26 years of age and older: ? A BMI below 18.5 is considered underweight. ? A BMI of 18.5 to 24.9 is normal. ? A BMI of 25 to 29.9 is considered overweight. ? A BMI of 30 and above is considered obese.  Watch levels of cholesterol and blood lipids  You should start having your blood tested for lipids and cholesterol at 73 years of age, then have this test every 5 years.  You may need to have your cholesterol levels checked more often if: ? Your lipid or cholesterol levels are high. ? You are older than 73 years of age. ? You are at high risk for heart disease.  Cancer screening Lung Cancer  Lung cancer screening is recommended for adults 71-73 years old who are at high risk for lung cancer because of a history of smoking.  A yearly low-dose CT scan of the lungs is recommended for people who: ? Currently smoke. ? Have quit within the past 15 years. ? Have at least a 30-pack-year history of smoking. A pack year is smoking an average of one pack of cigarettes a day for 1 year.  Yearly screening should continue until it has been 15 years since you quit.  Yearly screening should stop if you develop a health problem that would prevent you from having lung cancer treatment.  Breast Cancer  Practice breast self-awareness. This means understanding how your breasts normally appear and feel.  It also means doing regular breast self-exams. Let  your health care provider know about any changes, no matter how small.  If you are in your 20s or 30s, you should have a clinical breast exam (CBE) by a health care provider every 1-3 years as part of a regular health exam.  If you are 62 or older, have a CBE every year. Also consider having a breast X-ray (mammogram) every year.  If you have a family history of breast cancer, talk to your health care provider about genetic screening.  If you are at high risk for breast cancer, talk to your health  care provider about having an MRI and a mammogram every year.  Breast cancer gene (BRCA) assessment is recommended for women who have family members with BRCA-related cancers. BRCA-related cancers include: ? Breast. ? Ovarian. ? Tubal. ? Peritoneal cancers.  Results of the assessment will determine the need for genetic counseling and BRCA1 and BRCA2 testing.  Cervical Cancer Your health care provider may recommend that you be screened regularly for cancer of the pelvic organs (ovaries, uterus, and vagina). This screening involves a pelvic examination, including checking for microscopic changes to the surface of your cervix (Pap test). You may be encouraged to have this screening done every 3 years, beginning at age 42.  For women ages 68-65, health care providers may recommend pelvic exams and Pap testing every 3 years, or they may recommend the Pap and pelvic exam, combined with testing for human papilloma virus (HPV), every 5 years. Some types of HPV increase your risk of cervical cancer. Testing for HPV may also be done on women of any age with unclear Pap test results.  Other health care providers may not recommend any screening for nonpregnant women who are considered low risk for pelvic cancer and who do not have symptoms. Ask your health care provider if a screening pelvic exam is right for you.  If you have had past treatment for cervical cancer or a condition that could lead to cancer, you need Pap tests and screening for cancer for at least 20 years after your treatment. If Pap tests have been discontinued, your risk factors (such as having a new sexual partner) need to be reassessed to determine if screening should resume. Some women have medical problems that increase the chance of getting cervical cancer. In these cases, your health care provider may recommend more frequent screening and Pap tests.  Colorectal Cancer  This type of cancer can be detected and often  prevented.  Routine colorectal cancer screening usually begins at 72 years of age and continues through 73 years of age.  Your health care provider may recommend screening at an earlier age if you have risk factors for colon cancer.  Your health care provider may also recommend using home test kits to check for hidden blood in the stool.  A small camera at the end of a tube can be used to examine your colon directly (sigmoidoscopy or colonoscopy). This is done to check for the earliest forms of colorectal cancer.  Routine screening usually begins at age 39.  Direct examination of the colon should be repeated every 5-10 years through 73 years of age. However, you may need to be screened more often if early forms of precancerous polyps or small growths are found.  Skin Cancer  Check your skin from head to toe regularly.  Tell your health care provider about any new moles or changes in moles, especially if there is a change in a mole's shape or color.  Also tell your health care provider if you have a mole that is larger than the size of a pencil eraser.  Always use sunscreen. Apply sunscreen liberally and repeatedly throughout the day.  Protect yourself by wearing long sleeves, pants, a wide-brimmed hat, and sunglasses whenever you are outside.  Heart disease, diabetes, and high blood pressure  High blood pressure causes heart disease and increases the risk of stroke. High blood pressure is more likely to develop in: ? People who have blood pressure in the high end of the normal range (130-139/85-89 mm Hg). ? People who are overweight or obese. ? People who are African American.  If you are 47-81 years of age, have your blood pressure checked every 3-5 years. If you are 28 years of age or older, have your blood pressure checked every year. You should have your blood pressure measured twice-once when you are at a hospital or clinic, and once when you are not at a hospital or clinic.  Record the average of the two measurements. To check your blood pressure when you are not at a hospital or clinic, you can use: ? An automated blood pressure machine at a pharmacy. ? A home blood pressure monitor.  If you are between 84 years and 33 years old, ask your health care provider if you should take aspirin to prevent strokes.  Have regular diabetes screenings. This involves taking a blood sample to check your fasting blood sugar level. ? If you are at a normal weight and have a low risk for diabetes, have this test once every three years after 73 years of age. ? If you are overweight and have a high risk for diabetes, consider being tested at a younger age or more often. Preventing infection Hepatitis B  If you have a higher risk for hepatitis B, you should be screened for this virus. You are considered at high risk for hepatitis B if: ? You were born in a country where hepatitis B is common. Ask your health care provider which countries are considered high risk. ? Your parents were born in a high-risk country, and you have not been immunized against hepatitis B (hepatitis B vaccine). ? You have HIV or AIDS. ? You use needles to inject street drugs. ? You live with someone who has hepatitis B. ? You have had sex with someone who has hepatitis B. ? You get hemodialysis treatment. ? You take certain medicines for conditions, including cancer, organ transplantation, and autoimmune conditions.  Hepatitis C  Blood testing is recommended for: ? Everyone born from [birth year] through 1966. ? Anyone with known risk factors for hepatitis C.  Sexually transmitted infections (STIs)  You should be screened for sexually transmitted infections (STIs) including gonorrhea and chlamydia if: ? You are sexually active and are younger than 73 years of age. ? You are older than 73 years of age and your health care provider tells you that you are at risk for this type of infection. ? Your sexual  activity has changed since you were last screened and you are at an increased risk for chlamydia or gonorrhea. Ask your health care provider if you are at risk.  If you do not have HIV, but are at risk, it may be recommended that you take a prescription medicine daily to prevent HIV infection. This is called pre-exposure prophylaxis (PrEP). You are considered at risk if: ? You are sexually active and do not regularly use condoms or know the HIV status of  your partner(s). ? You take drugs by injection. ? You are sexually active with a partner who has HIV.  Talk with your health care provider about whether you are at high risk of being infected with HIV. If you choose to begin PrEP, you should first be tested for HIV. You should then be tested every 3 months for as long as you are taking PrEP. Pregnancy  If you are premenopausal and you may become pregnant, ask your health care provider about preconception counseling.  If you may become pregnant, take 400 to 800 micrograms (mcg) of folic acid every day.  If you want to prevent pregnancy, talk to your health care provider about birth control (contraception). Osteoporosis and menopause  Osteoporosis is a disease in which the bones lose minerals and strength with aging. This can result in serious bone fractures. Your risk for osteoporosis can be identified using a bone density scan.  If you are 81 years of age or older, or if you are at risk for osteoporosis and fractures, ask your health care provider if you should be screened.  Ask your health care provider whether you should take a calcium or vitamin D supplement to lower your risk for osteoporosis.  Menopause may have certain physical symptoms and risks.  Hormone replacement therapy may reduce some of these symptoms and risks. Talk to your health care provider about whether hormone replacement therapy is right for you. Follow these instructions at home:  Schedule regular health, dental,  and eye exams.  Stay current with your immunizations.  Do not use any tobacco products including cigarettes, chewing tobacco, or electronic cigarettes.  If you are pregnant, do not drink alcohol.  If you are breastfeeding, limit how much and how often you drink alcohol.  Limit alcohol intake to no more than 1 drink per day for nonpregnant women. One drink equals 12 ounces of beer, 5 ounces of wine, or 1 ounces of hard liquor.  Do not use street drugs.  Do not share needles.  Ask your health care provider for help if you need support or information about quitting drugs.  Tell your health care provider if you often feel depressed.  Tell your health care provider if you have ever been abused or do not feel safe at home. This information is not intended to replace advice given to you by your health care provider. Make sure you discuss any questions you have with your health care provider. Document Released: 03/31/2011 Document Revised: 02/21/2016 Document Reviewed: 06/19/2015 Elsevier Interactive Patient Education  2018 [employer].     IF you received an x-ray today, you will receive an invoice from [facility]. Please contact [facility] at [phone] with questions or concerns regarding your invoice.   IF you received labwork today, you will receive an invoice from [facility]. Please contact [facility] at [phone] with questions or concerns regarding your invoice.   Our billing staff will not be able to assist you with questions regarding bills from these companies.  You will be contacted with the lab results as soon as they are available. The fastest way to get your results is to activate your My Chart account. Instructions are located on the last page of this paperwork. If you have not heard from [place] regarding the results in 2 weeks, please contact this office.

## 2017-10-22 LAB — LIPID PANEL
CHOLESTEROL TOTAL: 261 mg/dL — AB (ref 100–199)
Chol/HDL Ratio: 3.1 ratio (ref 0.0–4.4)
HDL: 83 mg/dL (ref >39)
LDL Calculated: 152 mg/dL — ABNORMAL HIGH (ref 0–99)
Triglycerides: 131 mg/dL (ref 0–149)
VLDL CHOLESTEROL CAL: 26 mg/dL (ref 5–40)

## 2017-10-22 LAB — COMPREHENSIVE METABOLIC PANEL
ALBUMIN: 4.5 g/dL (ref 3.5–4.8)
ALK PHOS: 77 IU/L (ref 39–117)
ALT: 21 IU/L (ref 0–32)
AST: 24 IU/L (ref 0–40)
Albumin/Globulin Ratio: 1.7 (ref 1.2–2.2)
BILIRUBIN TOTAL: 0.6 mg/dL (ref 0.0–1.2)
BUN / CREAT RATIO: 19 (ref 12–28)
BUN: 15 mg/dL (ref 8–27)
CHLORIDE: 101 mmol/L (ref 96–106)
CO2: 23 mmol/L (ref 20–29)
Calcium: 9.6 mg/dL (ref 8.7–10.3)
Creatinine, Ser: 0.81 mg/dL (ref 0.57–1.00)
GFR calc Af Amer: 84 mL/min/{1.73_m2} (ref >59)
GFR calc non Af Amer: 73 mL/min/{1.73_m2} (ref >59)
GLUCOSE: 87 mg/dL (ref 65–99)
Globulin, Total: 2.7 g/dL (ref 1.5–4.5)
Potassium: 4.2 mmol/L (ref 3.5–5.2)
Sodium: 143 mmol/L (ref 134–144)
Total Protein: 7.2 g/dL (ref 6.0–8.5)

## 2017-10-22 LAB — CBC
HEMOGLOBIN: 13.9 g/dL (ref 11.1–15.9)
Hematocrit: 41.3 % (ref 34.0–46.6)
MCH: 28.5 pg (ref 26.6–33.0)
MCHC: 33.7 g/dL (ref 31.5–35.7)
MCV: 85 fL (ref 79–97)
Platelets: 305 10*3/uL (ref 150–379)
RBC: 4.88 x10E6/uL (ref 3.77–5.28)
RDW: 14.9 % (ref 12.3–15.4)
WBC: 6.1 10*3/uL (ref 3.4–10.8)

## 2017-10-22 LAB — T4, FREE: FREE T4: 1.45 ng/dL (ref 0.82–1.77)

## 2017-10-22 LAB — HEMOGLOBIN A1C
ESTIMATED AVERAGE GLUCOSE: 114 mg/dL
HEMOGLOBIN A1C: 5.6 % (ref 4.8–5.6)

## 2017-10-22 LAB — VITAMIN D 25 HYDROXY (VIT D DEFICIENCY, FRACTURES): VIT D 25 HYDROXY: 28.7 ng/mL — AB (ref 30.0–100.0)

## 2017-10-22 LAB — TSH: TSH: 2.47 u[IU]/mL (ref 0.450–4.500)

## 2017-10-23 ENCOUNTER — Telehealth: Payer: Self-pay

## 2017-10-23 DIAGNOSIS — I10 Essential (primary) hypertension: Secondary | ICD-10-CM | POA: Insufficient documentation

## 2017-10-23 DIAGNOSIS — R030 Elevated blood-pressure reading, without diagnosis of hypertension: Secondary | ICD-10-CM | POA: Insufficient documentation

## 2017-10-23 DIAGNOSIS — E6609 Other obesity due to excess calories: Secondary | ICD-10-CM | POA: Insufficient documentation

## 2017-10-23 DIAGNOSIS — Z6832 Body mass index (BMI) 32.0-32.9, adult: Secondary | ICD-10-CM | POA: Insufficient documentation

## 2017-10-23 DIAGNOSIS — M2550 Pain in unspecified joint: Secondary | ICD-10-CM | POA: Insufficient documentation

## 2017-10-23 DIAGNOSIS — E7800 Pure hypercholesterolemia, unspecified: Secondary | ICD-10-CM | POA: Insufficient documentation

## 2017-10-23 DIAGNOSIS — I4510 Unspecified right bundle-branch block: Secondary | ICD-10-CM | POA: Insufficient documentation

## 2017-10-23 MED ORDER — LEVOTHYROXINE SODIUM 150 MCG PO TABS: 150.0000 ug | ORAL_TABLET | Freq: Every day | ORAL | 3 refills | Status: DC

## 2017-10-23 NOTE — Telephone Encounter (Signed)
Call to patient to advise that Diclofenac gel is not approved under her current insurance plan.  Advised patient of the two options the representative gave me, which are trying an otc option or calling member services to inquire herself.  Patient stated that she is not interested in calling and will try the otc equivalent. I told her if her insurance changed or if she had any questions, she is welcome to call me for assistance.  She was very pleased by this.

## 2017-10-23 NOTE — Telephone Encounter (Signed)
Wonderful patient care Renay. Thank you.

## 2017-11-03 ENCOUNTER — Telehealth: Payer: Self-pay | Admitting: Family Medicine

## 2017-11-03 NOTE — Telephone Encounter (Signed)
Copied from CRM [id number]. Topic: Referral - Question >> Nov 03, 2017  5:13 PM Raquel SarnaHayes, Teresa G wrote: Pt needs to discuss why she has been referred to a cardiologist.  Pt made an appt for next week.  Please call pt asap.

## 2017-11-04 NOTE — Telephone Encounter (Signed)
lmvm to call back and CRM [id number] entered

## 2017-11-04 NOTE — Telephone Encounter (Signed)
Pt returning call regarding reason that she was referred to a cardiologist. Explained to pt per notes of Dr. Clelia CroftShaw on 10/21/17 regarding reason she was being referred to a cardiologist. Pt verbalized understanding and states she no further questions at this time.

## 2017-11-10 ENCOUNTER — Ambulatory Visit: Payer: Medicare Other | Admitting: Cardiology

## 2017-11-10 ENCOUNTER — Encounter: Payer: Self-pay | Admitting: Cardiology

## 2017-11-10 VITALS — BP 144/92 | HR 57 | Ht 65.75 in | Wt 202.0 lb

## 2017-11-10 DIAGNOSIS — Z8249 Family history of ischemic heart disease and other diseases of the circulatory system: Secondary | ICD-10-CM | POA: Diagnosis not present

## 2017-11-10 DIAGNOSIS — I4510 Unspecified right bundle-branch block: Secondary | ICD-10-CM

## 2017-11-10 DIAGNOSIS — Z6832 Body mass index (BMI) 32.0-32.9, adult: Secondary | ICD-10-CM

## 2017-11-10 DIAGNOSIS — I350 Nonrheumatic aortic (valve) stenosis: Secondary | ICD-10-CM | POA: Insufficient documentation

## 2017-11-10 DIAGNOSIS — R011 Cardiac murmur, unspecified: Secondary | ICD-10-CM

## 2017-11-10 DIAGNOSIS — E7800 Pure hypercholesterolemia, unspecified: Secondary | ICD-10-CM | POA: Diagnosis not present

## 2017-11-10 DIAGNOSIS — R600 Localized edema: Secondary | ICD-10-CM

## 2017-11-10 DIAGNOSIS — E6609 Other obesity due to excess calories: Secondary | ICD-10-CM

## 2017-11-10 DIAGNOSIS — R030 Elevated blood-pressure reading, without diagnosis of hypertension: Secondary | ICD-10-CM | POA: Diagnosis not present

## 2017-11-10 NOTE — Progress Notes (Signed)
PCP: Sherren Mocha, MD  Clinic Note: Chief Complaint  Patient presents with  . New Patient (Initial Visit)    Consult for right bundle branch block    HPI: Elizabeth Lin is a 72 y.o. female who is being seen today for the evaluation of new diagnosis of right bundle branch block at the request of Sherren Mocha, MD.  Elizabeth Lin was last seen on January 23rd by Dr. Dia Crawford for annual physical exam.  As part of this evaluation, she had an echocardiogram which now showed evidence of a complete right bundle branch block.  This is new from the most recent echocardiogram dated back in 2014.    Recent Hospitalizations: None  Studies Personally Reviewed - (if available, images/films reviewed: From Epic Chart or Care Everywhere)  None  Interval History: Elizabeth Lin presents today, really without any cardiac symptoms.  She says that she has not had any episodes of chest tightness or pressure with rest or exertion.  She may get short of breath if she really exercises, but because she is not been doing that well.  She has been trying to work on weight loss with diet and exercise but then she got off the band wagon with her diet, and is put back on weight. She says that she may occasionally off and on has some rare short-lived flip flopping  beats in her chest.  But this is not a very common occurrence.  Minimal lower extremity swelling, but is always had a little "puffiness ". No heart failure symptoms of PND, orthopnea.  No lightheadedness, dizziness or syncope/near syncope, TIA/amaurosis fugax.  No melena, hematochezia, hematuria, or epstaxis. No claudication.  ROS: A comprehensive was performed. Review of Systems  Constitutional: Negative for malaise/fatigue and weight loss (Up-and-down weights).  Respiratory: Negative for cough, shortness of breath and wheezing.   Cardiovascular:       Per HPI  Gastrointestinal: Negative for heartburn.  Musculoskeletal: Positive for joint pain (Mild  arthritis pains).  Neurological: Positive for dizziness (Rare).  Endo/Heme/Allergies: Positive for environmental allergies.  Psychiatric/Behavioral: Positive for depression (On stable dose of Cymbalta). Negative for memory loss. The patient is not nervous/anxious and does not have insomnia.   All other systems reviewed and are negative.    I have reviewed and (if needed) personally updated the patient's problem list, medications, allergies, past medical and surgical history, social and family history.   Past Medical History:  Diagnosis Date  . Allergy   . Anxiety   . Arthritis   . Cataract   . Depression   . Hammer toe   . Hypothyroidism due to Hashimoto's thyroiditis   . Thyroid disease     Past Surgical History:  Procedure Laterality Date  . APPENDECTOMY    . CYSTECTOMY     eye  . OOPHORECTOMY Right   . TONSILLECTOMY      Current Meds  Medication Sig  . BUDESONIDE NA Place into the nose.  . Calcium-Vitamin D-Vitamin K (CALCIUM + D + K PO) Take 1,500 mg by mouth daily. 1500 mg+D1000 IU  . Cholecalciferol (VITAMIN D-3 PO) Take 1 tablet by mouth daily.  . DULoxetine (CYMBALTA) 30 MG capsule TAKE 1 CAPSULE BY MOUTH EVERY DAY  . fish oil-omega-3 fatty acids 1000 MG capsule Take 2 g by mouth daily.  Marland Kitchen GLUCOSAMINE CHONDROITIN COMPLX PO Take 2,000 Units by mouth daily.  . GUAIFENESIN ER PO Take 600 mg by mouth 2 (two) times daily.  Marland Kitchen levothyroxine (SYNTHROID,  LEVOTHROID) 150 MCG tablet Take 1 tablet (150 mcg total) by mouth daily.  Marland Kitchen. neomycin-bacitracin-polymyxin (NEOSPORIN) ointment Apply 1 application topically every 12 (twelve) hours.    Allergies  Allergen Reactions  . Amoxicillin-Pot Clavulanate     Red rash    Social History   Tobacco Use  . Smoking status: Former Smoker    Types: Cigarettes    Last attempt to quit: 11/01/1974    Years since quitting: 43.0  . Smokeless tobacco: Former NeurosurgeonUser    Quit date: 06/11/1982  Substance Use Topics  . Alcohol use: No     Comment: occasional wine  . Drug use: No   Social History   Social History Narrative   Patient is Divorced and lives with oldest daughter and grandson.  3 children, 3 grandchildren.   Patient's  Education: [employer].  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   Patient is right handed   Caffeine consumption 2-3 daily    Walks daily for roughly 30 minutes at a time.  She has not been doing it in the wintertime due to cold weather.       family history includes CAD in her father; Cancer in her mother; Depression in her mother and son; Diabetes in her father; Heart disease in her maternal grandmother, paternal grandfather, and paternal grandmother; Hyperlipidemia in her brother; Hypertension in her brother and father; Mental illness in her mother; Myasthenia gravis in her father; Thyroid disease in her daughter and mother.  Wt Readings from Last 3 Encounters:  11/10/17 202 lb (91.6 kg)  10/21/17 198 lb 9.6 oz (90.1 kg)  10/31/16 215 lb (97.5 kg)    PHYSICAL EXAM BP (!) 144/92   Pulse (!) 57   Ht 5' 5.75" (1.67 m)   Wt 202 lb (91.6 kg)   BMI 32.85 kg/m  Physical Exam  Constitutional: She is oriented to person, place, and time. She appears well-developed and well-nourished. No distress.  HENT:  Head: Normocephalic and atraumatic.  Eyes: Pupils are equal, round, and reactive to light. No scleral icterus.  Neck: Normal range of motion. Neck supple. No hepatojugular reflux and no JVD present. Carotid bruit is not present.  Cardiovascular: Regular rhythm, S1 normal, S2 normal and normal pulses.  No extrasystoles are present. Bradycardia present. PMI is not displaced. Exam reveals no friction rub.  Murmur heard.  Low-pitched harsh crescendo-decrescendo early systolic murmur is present with a grade of 1/6 at the upper right sternal border. Pulmonary/Chest: Effort normal and breath sounds normal. No respiratory  distress. She has no wheezes.  Abdominal: Soft. Bowel sounds are normal. She exhibits no distension. There is no tenderness. There is no rebound.  Musculoskeletal: Normal range of motion. She exhibits edema (Trivial).  Neurological: She is alert and oriented to person, place, and time. No cranial nerve deficit.  Skin: Skin is warm and dry.  Psychiatric: She has a normal mood and affect. Her behavior is normal. Judgment and thought content normal.  Nursing note and vitals reviewed.    Adult ECG Report  Rate: 57;  Rhythm: sinus bradycardia and Incomplete RBBB.  Left axis deviation (-37).  Left atrial anomaly.;   Narrative Interpretation: Similar to PCP EKG   Other studies Reviewed: Additional studies/ records that were reviewed today include:  Recent Labs:    Lab Results  Component Value Date   CHOL 261 (H) 10/21/2017   HDL 83 10/21/2017   LDLCALC 152 (H) 10/21/2017  TRIG 131 10/21/2017   CHOLHDL 3.1 10/21/2017    ASSESSMENT / PLAN: Problem List Items Addressed This Visit    Bilateral edema of lower extremity    This seems to be more venous stasis related as opposed to cardiac, however, in the setting of the right bundle branch block need to exclude pulmonary hypertension. Plan: Check 2D echo.      Class 1 obesity due to excess calories without serious comorbidity with body mass index (BMI) of 32.0 to 32.9 in adult    Discussed weight as a concern for cardiovascular risk.  Otherwise she understands the importance of weight loss and diet/exercise.      Elevated blood pressure reading    Blood pressure is high today, meeting criteria for hypertension, however will need at least one more assessment prior to making this diagnosis.      Family history of premature CAD    She does have a family history of CAD, therefore we are checkingCoronary calcium score for screening.  She has hypertension and probably hyperlipidemia. Depending on what her evaluation with CT calcium score and  echocardiogram reveal, we will probably need to consider treating her blood pressure.      Relevant Orders   EKG 12-Lead   CT CARDIAC SCORING   ECHOCARDIOGRAM COMPLETE   New onset right bundle branch block (RBBB) - Primary    Right bundle branch block appears to simply just be progression of her previous conduction delay, however need to exclude structural abnormality.  She has not had any episodes of anything to suggest ischemia.  Therefore I do not think a stress test is necessary.  We will however need to evaluate for coronary disease. Plan: Coronary calcium score to evaluate for potential coronary disease along with 2D echocardiogram to evaluate for structural abnormality.      Relevant Orders   EKG 12-Lead   ECHOCARDIOGRAM COMPLETE   Pure hypercholesterolemia   Systolic murmur    Sounds like may be a flow murmur or simply aortic sclerosis based on exam, however we will check a 2D echocardiogram to exclude structural heart disease      Relevant Orders   CT CARDIAC SCORING   ECHOCARDIOGRAM COMPLETE      Current medicines are reviewed at length with the patient today. (+/- concerns) none The following changes have been made:None  Patient Instructions  NO MEDICATION CHANGES     SCHEDULE AT [address] Your physician has requested that you have an echocardiogram. Echocardiography is a painless test that uses sound waves to create images of your heart. It provides your doctor with information about the size and shape of your heart and how well your heart's chambers and valves are working. This procedure takes approximately one hour. There are no restrictions for this procedure.  AND  has ordered a CT coronary calcium score. This test is not covered by insurance the cost is $150 due the day of the test.   Studies Ordered:   Orders Placed This Encounter  Procedures  . CT CARDIAC SCORING  . EKG 12-Lead  . ECHOCARDIOGRAM COMPLETE      Bryan Lemma, M.D., M.S. Interventional Cardiologist   Pager # [phone] Phone # [phone] [address] [place], [place] [address]   Thank you for choosing [facility]!!

## 2017-11-10 NOTE — Patient Instructions (Signed)
NO MEDICATION CHANGES     SCHEDULE AT [address] Your physician has requested that you have an echocardiogram. Echocardiography is a painless test that uses sound waves to create images of your heart. It provides your doctor with information about the size and shape of your heart and how well your heart's chambers and valves are working. This procedure takes approximately one hour. There are no restrictions for this procedure.  AND  has ordered a CT coronary calcium score. This test is not covered by insurance the cost is $150 due the day of the test.   Coronary CalciumScan A coronary calcium scan is an imaging test used to look for deposits of calcium and other fatty materials (plaques) in the inner lining of the blood vessels of the heart (coronary arteries). These deposits of calcium and plaques can partly clog and narrow the coronary arteries without producing any symptoms or warning signs. This puts a person at risk for a heart attack. This test can detect these deposits before symptoms develop. Tell a health care provider about:  Any allergies you have.  All medicines you are taking, including vitamins, herbs, eye drops, creams, and over-the-counter medicines.  Any problems you or family members have had with anesthetic medicines.  Any blood disorders you have.  Any surgeries you have had.  Any medical conditions you have.  Whether you are pregnant or may be pregnant. What are the risks? Generally, this is a safe procedure. However, problems may occur, including:  Harm to a pregnant woman and her unborn baby. This test involves the use of radiation. Radiation exposure can be dangerous to a pregnant woman and her unborn baby. If you are pregnant, you generally should not have this procedure done.  Slight increase in the risk of cancer. This is because of the radiation involved in the test. What happens before the procedure? No preparation is needed for  this procedure. What happens during the procedure?  You will undress and remove any jewelry around your neck or chest.  You will put on a hospital gown.  Sticky electrodes will be placed on your chest. The electrodes will be connected to an electrocardiogram (ECG) machine to record a tracing of the electrical activity of your heart.  A CT scanner will take pictures of your heart. During this time, you will be asked to lie still and hold your breath for 2-3 seconds while a picture of your heart is being taken. The procedure may vary among health care providers and hospitals. What happens after the procedure?  You can get dressed.  You can return to your normal activities.  It is up to you to get the results of your test. Ask your health care provider, or the department that is doing the test, when your results will be ready. Summary  A coronary calcium scan is an imaging test used to look for deposits of calcium and other fatty materials (plaques) in the inner lining of the blood vessels of the heart (coronary arteries).  Generally, this is a safe procedure. Tell your health care provider if you are pregnant or may be pregnant.  No preparation is needed for this procedure.  A CT scanner will take pictures of your heart.  You can return to your normal activities after the scan is done. This information is not intended to replace advice given to you by your health care provider. Make sure you discuss any questions you have with your health care provider.  Document Released: 03/13/2008 Document Revised: 08/04/2016 Document Reviewed: 08/04/2016 Elsevier Interactive Patient Education  2017 [employer]

## 2017-11-13 ENCOUNTER — Encounter: Payer: Self-pay | Admitting: Cardiology

## 2017-11-13 NOTE — Assessment & Plan Note (Signed)
Sounds like may be a flow murmur or simply aortic sclerosis based on exam, however we will check a 2D echocardiogram to exclude structural heart disease

## 2017-11-13 NOTE — Assessment & Plan Note (Signed)
Discussed weight as a concern for cardiovascular risk.  Otherwise she understands the importance of weight loss and diet/exercise.

## 2017-11-13 NOTE — Assessment & Plan Note (Signed)
Right bundle branch block appears to simply just be progression of her previous conduction delay, however need to exclude structural abnormality.  She has not had any episodes of anything to suggest ischemia.  Therefore I do not think a stress test is necessary.  We will however need to evaluate for coronary disease. Plan: Coronary calcium score to evaluate for potential coronary disease along with 2D echocardiogram to evaluate for structural abnormality.

## 2017-11-13 NOTE — Assessment & Plan Note (Signed)
Blood pressure is high today, meeting criteria for hypertension, however will need at least one more assessment prior to making this diagnosis.

## 2017-11-13 NOTE — Assessment & Plan Note (Signed)
This seems to be more venous stasis related as opposed to cardiac, however, in the setting of the right bundle branch block need to exclude pulmonary hypertension. Plan: Check 2D echo.

## 2017-11-13 NOTE — Assessment & Plan Note (Addendum)
She does have a family history of CAD, therefore we are checkingCoronary calcium score for screening.  She has hypertension and probably hyperlipidemia. Depending on what her evaluation with CT calcium score and echocardiogram reveal, we will probably need to consider treating her blood pressure.

## 2017-11-16 ENCOUNTER — Ambulatory Visit: Payer: Medicare Other | Admitting: Physician Assistant

## 2017-11-18 ENCOUNTER — Other Ambulatory Visit: Payer: Self-pay

## 2017-11-18 ENCOUNTER — Ambulatory Visit (HOSPITAL_COMMUNITY): Payer: Medicare Other | Attending: Cardiology

## 2017-11-18 ENCOUNTER — Ambulatory Visit (INDEPENDENT_AMBULATORY_CARE_PROVIDER_SITE_OTHER)
Admission: RE | Admit: 2017-11-18 | Discharge: 2017-11-18 | Disposition: A | Discharge status: Home / Self Care | Payer: Self-pay | Source: Ambulatory Visit | Attending: Cardiology | Admitting: Cardiology

## 2017-11-18 DIAGNOSIS — I350 Nonrheumatic aortic (valve) stenosis: Secondary | ICD-10-CM | POA: Diagnosis not present

## 2017-11-18 DIAGNOSIS — I4510 Unspecified right bundle-branch block: Secondary | ICD-10-CM | POA: Diagnosis not present

## 2017-11-18 DIAGNOSIS — R011 Cardiac murmur, unspecified: Secondary | ICD-10-CM | POA: Insufficient documentation

## 2017-11-18 DIAGNOSIS — Z8249 Family history of ischemic heart disease and other diseases of the circulatory system: Secondary | ICD-10-CM

## 2017-11-20 ENCOUNTER — Encounter: Payer: Self-pay | Admitting: Family Medicine

## 2017-11-20 ENCOUNTER — Ambulatory Visit: Payer: Medicare Other | Admitting: Family Medicine

## 2017-11-20 VITALS — BP 146/84 | HR 64 | Temp 98.5°F | Resp 17 | Ht 65.75 in | Wt 199.0 lb

## 2017-11-20 DIAGNOSIS — H9312 Tinnitus, left ear: Secondary | ICD-10-CM

## 2017-11-20 DIAGNOSIS — H918X2 Other specified hearing loss, left ear: Secondary | ICD-10-CM | POA: Diagnosis not present

## 2017-11-20 DIAGNOSIS — I4510 Unspecified right bundle-branch block: Secondary | ICD-10-CM | POA: Diagnosis not present

## 2017-11-20 DIAGNOSIS — H9120 Sudden idiopathic hearing loss, unspecified ear: Secondary | ICD-10-CM

## 2017-11-20 DIAGNOSIS — R42 Dizziness and giddiness: Secondary | ICD-10-CM | POA: Diagnosis not present

## 2017-11-20 LAB — POCT CBC
Granulocyte percent: 61.7 %G (ref 37–80)
HEMATOCRIT: 40.8 % (ref 37.7–47.9)
Hemoglobin: 13.8 g/dL (ref 12.2–16.2)
Lymph, poc: 2 (ref 0.6–3.4)
MCH: 28.3 pg (ref 27–31.2)
MCHC: 33.8 g/dL (ref 31.8–35.4)
MCV: 83.9 fL (ref 80–97)
MID (cbc): 0.4 (ref 0–0.9)
MPV: 6.9 fL (ref 0–99.8)
POC GRANULOCYTE: 3.8 (ref 2–6.9)
POC LYMPH PERCENT: 32.5 %L (ref 10–50)
POC MID %: 5.8 % (ref 0–12)
Platelet Count, POC: 321 10*3/uL (ref 142–424)
RBC: 4.86 M/uL (ref 4.04–5.48)
RDW, POC: 14.8 %
WBC: 6.2 10*3/uL (ref 4.6–10.2)

## 2017-11-20 LAB — GLUCOSE, POCT (MANUAL RESULT ENTRY): POC Glucose: 70 mg/dl (ref 70–99)

## 2017-11-20 MED ORDER — PREDNISONE 20 MG PO TABS: ORAL_TABLET | ORAL | 0 refills | Status: DC

## 2017-11-20 NOTE — Progress Notes (Signed)
Subjective:  By signing my name below, I, Elizabeth Lin, attest that this documentation has been prepared under the direction and in the presence of Elizabeth FloodJeffrey R Dyquan Minks, MD Electronically Signed: Charline BillsEssence Lin, ED Scribe 11/20/2017 at 12:08 PM.   Patient ID: Elizabeth Lin, female    DOB: 1946/03/11, 72 y.o.   MRN: [id number]  Chief Complaint  Patient presents with  . Dizziness     x 2 weeks, woke this morning walking to the right, but better now.  Pt advises  she has no congestion   . hearing loss in the left ear     x 2 weeks, buzzing in ear and can hear in right ear hear distant   HPI Elizabeth Lin is a 72 y.o. female who presents to Primary Care at [facility] complaining of dizziness x 2 wks, buzzing in L ear and hearing loss x 2 wks. H/o multiple medical problems including hypothyroidism, depression, obesity, heart murmur, former smoker. Recently 10 days ago for new onset RBBB by Dr. Rennis Lin. Planned for coronary calcium score with family h/o CAD and echo. She had a 2D echo 2 days ago. EF 60-65%. Grade 1 diastolic dysfunction. Mild aortic stenosis. Also had carotid doppler in Sept 2014, normal bilaterally. Seen for dizziness by neuro in 2014. MRI of c-spine at that time with spondylitic changes C5/6 and C6/7. No significant compression. She also brings a record of home BPs earlier this month ranging from 103-171/76-86 and HRs 49-64. She has had colonoscopy a few yrs ago; will obtain records.  Pt reports that both ears were initially blocked but the R ear cleared 1 wk ago. She is still experiencing blockage/hearing loss on the L with a buzzing. Reports symptoms began with dizziness and "bumpy" sensation and L-sided neck soreness. Denies ear pain, fever, congestion, cough, cp, palpitations, sob. She reports blockage is intermittent which she was told was eustachian tube dysfunction, however, she has never had dizziness or tinnitus with this in the past. Pt has been wearing bilateral hearing aids  for 1.5 yrs. She has an ENT appointment with Dr. Dorma Lin on 2/27.  Patient Active Problem List   Diagnosis Date Noted  . Systolic murmur 11/10/2017  . Bilateral edema of lower extremity 11/10/2017  . Family history of premature CAD 11/10/2017  . Arthralgia of multiple joints 10/23/2017  . Class 1 obesity due to excess calories without serious comorbidity with body mass index (BMI) of 32.0 to 32.9 in adult 10/23/2017  . Elevated blood pressure reading 10/23/2017  . New onset right bundle branch block (RBBB) 10/23/2017  . Pure hypercholesterolemia 10/23/2017  . Vitamin D deficiency 09/06/2014  . Right knee pain 02/24/2013  . Hypothyroid 11/02/2011  . Depression 11/02/2011  . 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 Allergy   . Anxiety   . Arthritis   . Cataract   . Depression   . Hammer toe   . Hypothyroidism due to Hashimoto's thyroiditis   . Thyroid disease    Past Surgical History:  Procedure Laterality Date  . APPENDECTOMY    . CYSTECTOMY     eye  . OOPHORECTOMY Right   . TONSILLECTOMY     Allergies  Allergen Reactions  . Amoxicillin-Pot Clavulanate     Red rash   Prior to Admission medications   Medication Sig Start Date End Date Taking? Authorizing Provider  BUDESONIDE NA Place into the nose.    [provider]  Calcium-Vitamin D-Vitamin  K (CALCIUM + D + K PO) Take 1,500 mg by mouth daily. 1500 mg+D1000 IU    [provider]  Cholecalciferol (VITAMIN D-3 PO) Take 1 tablet by mouth daily.    [provider]  DULoxetine (CYMBALTA) 30 MG capsule TAKE 1 CAPSULE BY MOUTH EVERY DAY 10/21/17   Sherren Mocha, MD  fish oil-omega-3 fatty acids 1000 MG capsule Take 2 g by mouth daily.    [provider]  GLUCOSAMINE CHONDROITIN COMPLX PO Take 2,000 Units by mouth daily.    [provider]  GUAIFENESIN ER PO Take 600 mg by mouth 2 (two) times daily.    [provider]  levothyroxine  (SYNTHROID, LEVOTHROID) 150 MCG tablet Take 1 tablet (150 mcg total) by mouth daily. 10/23/17   Sherren Mocha, MD  neomycin-bacitracin-polymyxin (NEOSPORIN) ointment Apply 1 application topically every 12 (twelve) hours.    [provider]   Social History   Socioeconomic History  . Marital status: Single    Spouse name: Not on file  . Number of children: 3  . Years of education: college  . Highest education level: Bachelor's degree (e.g., BA, AB, BS)  Social Needs  . Financial resource strain: Not on file  . Food insecurity - worry: Not on file  . Food insecurity - inability: Not on file  . Transportation needs - medical: Not on file  . Transportation needs - non-medical: Not on file  Occupational History  . 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 Smoking status: Former Smoker    Types: Cigarettes    Last attempt to quit: 11/01/1974    Years since quitting: 43.0  . Smokeless tobacco: Former Neurosurgeon    Quit date: 06/11/1982  Substance and Sexual Activity  . Alcohol use: No    Comment: occasional wine  . Drug use: No  . Sexual activity: No  Other Topics Concern  . Not on file  Social History Narrative   Patient is Divorced and lives with oldest daughter and grandson.  3 children, 3 grandchildren.   Patient's  Education: [employer].  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   Patient is right handed   Caffeine consumption 2-3 daily    Walks daily for roughly 30 minutes at a time.  She has not been doing it in the wintertime due to cold weather.      Review of Systems  Constitutional: Negative for fever.  HENT: Positive for hearing loss and tinnitus (buzzing on the L). Negative for congestion and ear pain.   Respiratory: Negative for cough and shortness of breath.   Cardiovascular: Negative for chest pain and palpitations.  Musculoskeletal:  Positive for neck pain (soreness).  Neurological: Positive for dizziness.      Objective:   Physical Exam  Constitutional: She is oriented to person, place, and time. She appears well-developed and well-nourished.  HENT:  Head: Normocephalic and atraumatic.  R TM: Moderate dry cerumen but able to visualize TM. Appears pearly grey. No rash. No canal edema. L TM: appears pearly grey. Possible scar at the base. Canal appears clear. No rash. Weber testing localizes to the R. Air conduction greater than bone conduction on L.  Eyes: Conjunctivae and EOM are normal. Pupils are equal, round, and reactive to light. Right eye exhibits no nystagmus. Left eye exhibits no nystagmus.  Neck: Carotid bruit is not present.  Cardiovascular: Normal rate, regular rhythm, normal heart sounds and intact distal pulses.  Pulmonary/Chest: Effort normal and breath sounds normal.  Abdominal: Soft. She exhibits no pulsatile midline mass. There is no tenderness.  Neurological: She is alert and oriented to person, place, and time. She displays a negative Romberg sign.  No pronator drift. Normal finger to nose.  Skin: Skin is warm and dry.  Psychiatric: She has a normal mood and affect. Her behavior is normal.  Vitals reviewed.  Vitals:   11/20/17 1143  BP: (!) 146/84  Pulse: 64  Resp: 17  Temp: 98.5 F (36.9 C)  TempSrc: Oral  SpO2: 96%  Weight: 199 lb (90.3 kg)  Height: 5' 5.75" (1.67 m)   EKG reading done by Elizabeth Flood, MD: sinus bradycardia rate 51. No apparent significant change from previous. Results for orders placed or performed in visit on 11/20/17  POCT CBC  Result Value Ref Range   WBC 6.2 4.6 - 10.2 K/uL   Lymph, poc 2.0 0.6 - 3.4   POC LYMPH PERCENT 32.5 10 - 50 %L   MID (cbc) 0.4 0 - 0.9   POC MID % 5.8 0 - 12 %M   POC Granulocyte 3.8 2 - 6.9   Granulocyte percent 61.7 37 - 80 %G   RBC 4.86 4.04 - 5.48 M/uL   Hemoglobin 13.8 12.2 - 16.2 g/dL   HCT, POC 16.1 09.6 - 47.9 %   MCV  83.9 80 - 97 fL   MCH, POC 28.3 27 - 31.2 pg   MCHC 33.8 31.8 - 35.4 g/dL   RDW, POC 04.5 %   Platelet Count, POC 321 142 - 424 K/uL   MPV 6.9 0 - 99.8 fL  POCT glucose (manual entry)  Result Value Ref Range   POC Glucose 70 70 - 99 mg/dl       Assessment & Plan:    Elizabeth Lin is a 72 y.o. female Sudden-onset sensorineural hearing loss - Plan: predniSONE (DELTASONE) 20 MG tablet  Dizziness - Plan: POCT CBC, POCT glucose (manual entry), EKG 12-Lead  Tinnitus of left ear - Plan: predniSONE (DELTASONE) 20 MG tablet  Other specified hearing loss of left ear, unspecified hearing status on contralateral side - Plan: predniSONE (DELTASONE) 20 MG tablet  RBBB - Plan: EKG 12-Lead  CBC, EKG, and nonfocal neuro exam reassuring. Glucose normal. Symptoms concerning for possible sudden onset sensorineural hearing loss, with prior hearing difficulty. Discussed potential persistent symptoms given onset of symptoms 2 weeks ago, but will start high-dose prednisone for now 60 mg daily until follow-up appointment with ENT in 5 days. As of now we'll have that tapered at that visit, but ENT can decide if persistent high dose prednisone needed for 10-14 days. RTC precautions given prior to that visit. Potential side effects of prednisone discussed  Meds ordered this encounter  Medications  . predniSONE (DELTASONE) 20 MG tablet    Sig: 3 by mouth for 5 days, then 2 by mouth for 2 days, then 1 by mouth for 2 days, then 1/2 by mouth for 2 days.    Dispense:  22 tablet    Refill:  0   Patient Instructions    Start prednisone for possible sudden onset sensorineural hearing loss. I do have that at the high dose of 60 mg until you are seen by ear nose and throat next Wednesday. If they would like you to remain on that high dose, additional prescription can be given. As of right  now I have it tapering starting on Wednesday.Return to the clinic or go to the nearest emergency room if any of your symptoms  worsen or new symptoms occur.    Hearing Loss Hearing loss is a partial or total loss of the ability to hear. This can be temporary or permanent, and it can happen in one or both ears. Hearing loss may be referred to as deafness. Medical care is necessary to treat hearing loss properly and to prevent the condition from getting worse. Your hearing may partially or completely come back, depending on what caused your hearing loss and how severe it is. In some cases, hearing loss is permanent. What are the causes? Common causes of hearing loss include:  Too much wax in the ear canal.  Infection of the ear canal or middle ear.  Fluid in the middle ear.  Injury to the ear or surrounding area.  An object stuck in the ear.  Prolonged exposure to loud sounds, such as music.  Less common causes of hearing loss include:  Tumors in the ear.  Viral or bacterial infections, such as meningitis.  A hole in the eardrum (perforated eardrum).  Problems with the hearing nerve that sends signals between the brain and the ear.  Certain medicines.  What are the signs or symptoms? Symptoms of this condition may include:  Difficulty telling the difference between sounds.  Difficulty following a conversation when there is background noise.  Lack of response to sounds in your environment. This may be most noticeable when you do not respond to startling sounds.  Needing to turn up the volume on the television, radio, etc.  Ringing in the ears.  Dizziness.  Pain in the ears.  How is this diagnosed? This condition is diagnosed based on a physical exam and a hearing test (audiometry). The audiometry test will be performed by a hearing specialist (audiologist). You may also be referred to an ear, nose, and throat (ENT) specialist (otolaryngologist). How is this treated? Treatment for recent onset of hearing loss may include:  Ear wax removal.  Being prescribed medicines to prevent infection  (antibiotics).  Being prescribed medicines to reduce inflammation (corticosteroids).  Follow these instructions at home:  If you were prescribed an antibiotic medicine, take it as told by your health care provider. Do not stop taking the antibiotic even if you start to feel better.  Take over-the-counter and prescription medicines only as told by your health care provider.  Avoid loud noises.  Return to your normal activities as told by your health care provider. Ask your health care provider what activities are safe for you.  Keep all follow-up visits as told by your health care provider. This is important. Contact a health care provider if:  You feel dizzy.  You develop new symptoms.  You vomit or feel nauseous.  You have a fever. Get help right away if:  You develop sudden changes in your vision.  You have severe ear pain.  You have new or increased weakness.  You have a severe headache. This information is not intended to replace advice given to you by your health care provider. Make sure you discuss any questions you have with your health care provider. Document Released: 09/15/2005 Document Revised: 02/21/2016 Document Reviewed: 01/31/2015 Elsevier Interactive Patient Education  2018 [employer].    IF you received an x-ray today, you will receive an invoice from [facility]. Please contact [facility] at [phone] with questions or concerns regarding your invoice.  IF you received labwork today, you will receive an invoice from [place]. Please contact [facility] at [phone] with questions or concerns regarding your invoice.   Our billing staff will not be able to assist you with questions regarding Lin from these companies.  You will be contacted with the lab results as soon as they are available. The fastest way to get your results is to activate your My Chart account. Instructions are located on the last page of this paperwork. If you  have not heard from [place] regarding the results in 2 weeks, please contact this office.      I personally performed the services described in this documentation, which was scribed in my presence. The recorded information has been reviewed and considered for accuracy and completeness, addended by me as needed, and agree with information above.  Signed,   Meredith Staggers, MD Primary Care at [facility].  11/21/17 3:20 PM

## 2017-11-20 NOTE — Patient Instructions (Addendum)
Start prednisone for possible sudden onset sensorineural hearing loss. I do have that at the high dose of 60 mg until you are seen by ear nose and throat next Wednesday. If they would like you to remain on that high dose, additional prescription can be given. As of right now I have it tapering starting on Wednesday.Return to the clinic or go to the nearest emergency room if any of your symptoms worsen or new symptoms occur.    Hearing Loss Hearing loss is a partial or total loss of the ability to hear. This can be temporary or permanent, and it can happen in one or both ears. Hearing loss may be referred to as deafness. Medical care is necessary to treat hearing loss properly and to prevent the condition from getting worse. Your hearing may partially or completely come back, depending on what caused your hearing loss and how severe it is. In some cases, hearing loss is permanent. What are the causes? Common causes of hearing loss include:  Too much wax in the ear canal.  Infection of the ear canal or middle ear.  Fluid in the middle ear.  Injury to the ear or surrounding area.  An object stuck in the ear.  Prolonged exposure to loud sounds, such as music.  Less common causes of hearing loss include:  Tumors in the ear.  Viral or bacterial infections, such as meningitis.  A hole in the eardrum (perforated eardrum).  Problems with the hearing nerve that sends signals between the brain and the ear.  Certain medicines.  What are the signs or symptoms? Symptoms of this condition may include:  Difficulty telling the difference between sounds.  Difficulty following a conversation when there is background noise.  Lack of response to sounds in your environment. This may be most noticeable when you do not respond to startling sounds.  Needing to turn up the volume on the television, radio, etc.  Ringing in the ears.  Dizziness.  Pain in the ears.  How is this  diagnosed? This condition is diagnosed based on a physical exam and a hearing test (audiometry). The audiometry test will be performed by a hearing specialist (audiologist). You may also be referred to an ear, nose, and throat (ENT) specialist (otolaryngologist). How is this treated? Treatment for recent onset of hearing loss may include:  Ear wax removal.  Being prescribed medicines to prevent infection (antibiotics).  Being prescribed medicines to reduce inflammation (corticosteroids).  Follow these instructions at home:  If you were prescribed an antibiotic medicine, take it as told by your health care provider. Do not stop taking the antibiotic even if you start to feel better.  Take over-the-counter and prescription medicines only as told by your health care provider.  Avoid loud noises.  Return to your normal activities as told by your health care provider. Ask your health care provider what activities are safe for you.  Keep all follow-up visits as told by your health care provider. This is important. Contact a health care provider if:  You feel dizzy.  You develop new symptoms.  You vomit or feel nauseous.  You have a fever. Get help right away if:  You develop sudden changes in your vision.  You have severe ear pain.  You have new or increased weakness.  You have a severe headache. This information is not intended to replace advice given to you by your health care provider. Make sure you discuss any questions you have with your health care  provider. Document Released: 09/15/2005 Document Revised: 02/21/2016 Document Reviewed: 01/31/2015 Elsevier Interactive Patient Education  2018 [employer].    IF you received an x-ray today, you will receive an invoice from [facility]. Please contact [facility] at [phone] with questions or concerns regarding your invoice.   IF you received labwork today, you will receive an invoice from [place].  Please contact [facility] at [phone] with questions or concerns regarding your invoice.   Our billing staff will not be able to assist you with questions regarding bills from these companies.  You will be contacted with the lab results as soon as they are available. The fastest way to get your results is to activate your My Chart account. Instructions are located on the last page of this paperwork. If you have not heard from [place] regarding the results in 2 weeks, please contact this office.

## 2017-11-21 ENCOUNTER — Encounter: Payer: Self-pay | Admitting: Family Medicine

## 2017-12-01 ENCOUNTER — Ambulatory Visit
Admission: RE | Admit: 2017-12-01 | Discharge: 2017-12-01 | Disposition: A | Discharge status: Home / Self Care | Payer: Medicare Other | Source: Ambulatory Visit | Attending: Otolaryngology | Admitting: Otolaryngology

## 2017-12-01 ENCOUNTER — Other Ambulatory Visit: Payer: Self-pay | Admitting: Otolaryngology

## 2017-12-01 DIAGNOSIS — H8103 Meniere's disease, bilateral: Secondary | ICD-10-CM

## 2017-12-01 DIAGNOSIS — H903 Sensorineural hearing loss, bilateral: Secondary | ICD-10-CM

## 2017-12-02 ENCOUNTER — Other Ambulatory Visit: Payer: Self-pay | Admitting: Otolaryngology

## 2017-12-02 DIAGNOSIS — H8103 Meniere's disease, bilateral: Secondary | ICD-10-CM

## 2017-12-02 DIAGNOSIS — H903 Sensorineural hearing loss, bilateral: Secondary | ICD-10-CM

## 2017-12-05 ENCOUNTER — Ambulatory Visit
Admission: RE | Admit: 2017-12-05 | Discharge: 2017-12-05 | Disposition: A | Discharge status: Home / Self Care | Payer: Medicare Other | Source: Ambulatory Visit | Attending: Otolaryngology | Admitting: Otolaryngology

## 2017-12-05 DIAGNOSIS — H8103 Meniere's disease, bilateral: Secondary | ICD-10-CM

## 2017-12-05 DIAGNOSIS — H903 Sensorineural hearing loss, bilateral: Secondary | ICD-10-CM

## 2017-12-08 ENCOUNTER — Encounter: Payer: Self-pay | Admitting: Cardiology

## 2017-12-08 ENCOUNTER — Ambulatory Visit: Payer: Medicare Other | Admitting: Cardiology

## 2017-12-08 VITALS — BP 141/80 | HR 62 | Ht 65.5 in | Wt 200.0 lb

## 2017-12-08 DIAGNOSIS — Z8249 Family history of ischemic heart disease and other diseases of the circulatory system: Secondary | ICD-10-CM

## 2017-12-08 DIAGNOSIS — E7800 Pure hypercholesterolemia, unspecified: Secondary | ICD-10-CM | POA: Diagnosis not present

## 2017-12-08 DIAGNOSIS — I350 Nonrheumatic aortic (valve) stenosis: Secondary | ICD-10-CM | POA: Diagnosis not present

## 2017-12-08 DIAGNOSIS — I4510 Unspecified right bundle-branch block: Secondary | ICD-10-CM | POA: Diagnosis not present

## 2017-12-08 NOTE — Progress Notes (Signed)
PCP: Sherren MochaShaw, Eva N, MD  Clinic Note: Chief Complaint  Patient presents with  . Follow-up    Evaluation right bundle branch block and murmur.    HPI: Elizabeth Lin is a 72 y.o. female who is being seen today for follow-up evaluation of new diagnosis of right bundle branch block at the request of Sherren MochaShaw, Eva N, MD -who saw her on January 23rd for annual physical exam.  As part of this evaluation, she had an EKG which now showed evidence of a complete right bundle branch block.  This is new from the most recent EKG dated back in 2014.    Elizabeth Lin was seen for initial consultation on November 10, 2017 --  really without any cardiac symptoms.  She denies any chest tightness or pressure with rest or exertion.  If she really proceed with exercise she may get dyspneic but otherwise no symptoms.  Working on losing weight with diet and exercise.  Unfortunately suffered a "relapse" and has regained weight.  She says that she may occasionally off and on has some rare short-lived flip flopping  beats in her chest.  But this is not a very common occurrence.  Minimal lower extremity swelling, but is always had a little "puffiness ". -She was evaluated coronary calcium score and 2D echocardiogram - .   Recent Hospitalizations: None  Studies Personally Reviewed - (if available, images/films reviewed: From Epic Chart or Care Everywhere)  2D Echo -EF 60-65%.  No regional wall motion normality.  GR 1 DD.  Mild aortic stenosis (mean gradient 9 mmHg).  Mild LA dilation.    Coronary calcium score = 0   Interval History: Elizabeth Lin presents today  to evaluate a murmur.  Really with no major symptoms.  She is here with her daughter and is very interested in the anatomy and physiology of bundle branch blocks etc.  She also is interested on the findings of the echocardiogram.  She herself denies any chest tightness or pressure with rest or exertion.  She denies any irregular heartbeats or palpitations besides  the rare flopping.  No PND orthopnea or edema.   She has been waiting for the results of the studies before she went back to her exercise regimen.  No TIA/amaurosis fugax, syncope or near syncope.  ROS: A comprehensive was performed. Review of Systems  Constitutional: Negative for malaise/fatigue and weight loss (Up-and-down weights).  Respiratory: Negative for cough, shortness of breath and wheezing.   Cardiovascular:       Per HPI  Gastrointestinal: Negative for heartburn.  Musculoskeletal: Positive for joint pain (Mild arthritis pains).  Neurological: Positive for dizziness (Rare).  Endo/Heme/Allergies: Positive for environmental allergies.  Psychiatric/Behavioral: Positive for depression (On stable dose of Cymbalta). Negative for memory loss. The patient is not nervous/anxious and does not have insomnia.   All other systems reviewed and are negative.  I have reviewed and (if needed) personally updated the patient's problem list, medications, allergies, past medical and surgical history, social and family history.   Past Medical History:  Diagnosis Date  . Allergy   . Anxiety   . Arthritis   . Cataract   . Depression   . Hammer toe   . Hypothyroidism due to Hashimoto's thyroiditis   . Thyroid disease     Past Surgical History:  Procedure Laterality Date  . APPENDECTOMY    . CYSTECTOMY     eye  . OOPHORECTOMY Right   . TONSILLECTOMY    . TRANSTHORACIC ECHOCARDIOGRAM  10/2016   No regional wall motion normality.  GR 1 DD.  Mild aortic stenosis (mean gradient 9 mmHg).  Mild LA dilation.  November 2018    Current Meds  Medication Sig  . BUDESONIDE NA Place into the nose.  . Calcium-Vitamin D-Vitamin K (CALCIUM + D + K PO) Take 1,500 mg by mouth daily. 1500 mg+D1000 IU  . Cholecalciferol (VITAMIN D-3 PO) Take 1 tablet by mouth daily.  . diazepam (VALIUM) 5 MG tablet as needed for dizziness.  . DULoxetine (CYMBALTA) 30 MG capsule TAKE 1 CAPSULE BY MOUTH EVERY DAY  . fish  oil-omega-3 fatty acids 1000 MG capsule Take 2 g by mouth daily.  Marland Kitchen GLUCOSAMINE CHONDROITIN COMPLX PO Take 2,000 Units by mouth daily.  Marland Kitchen levothyroxine (SYNTHROID, LEVOTHROID) 150 MCG tablet Take 1 tablet (150 mcg total) by mouth daily.  Marland Kitchen neomycin-bacitracin-polymyxin (NEOSPORIN) ointment Apply 1 application topically every 12 (twelve) hours.  . ondansetron (ZOFRAN) 4 MG tablet as needed for nausea/vomiting.  . predniSONE (DELTASONE) 20 MG tablet 3 by mouth for 5 days, then 2 by mouth for 2 days, then 1 by mouth for 2 days, then 1/2 by mouth for 2 days.  . pseudoephedrine (SUDAFED) 120 MG 12 hr tablet Take 120 mg by mouth every 12 (twelve) hours as needed for congestion.  . triamterene-hydrochlorothiazide (DYAZIDE) 37.5-25 MG capsule daily.    Allergies  Allergen Reactions  . Amoxicillin-Pot Clavulanate     Red rash    Social History   Tobacco Use  . Smoking status: Former Smoker    Types: Cigarettes    Last attempt to quit: 11/01/1974    Years since quitting: 43.1  . Smokeless tobacco: Former Neurosurgeon    Quit date: 06/11/1982  Substance Use Topics  . Alcohol use: No    Comment: occasional wine  . Drug use: No   Social History   Social History Narrative   Patient is Divorced and lives with oldest daughter and grandson.  3 children, 3 grandchildren.   Patient's  Education: [employer].  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   Patient is right handed   Caffeine consumption 2-3 daily    Walks daily for roughly 30 minutes at a time.  She has not been doing it in the wintertime due to cold weather.       family history includes CAD in her father; Cancer in her mother; Depression in her mother and son; Diabetes in her father; Heart disease in her maternal grandmother, paternal grandfather, and paternal grandmother; Hyperlipidemia in her brother; Hypertension in her brother and father; Mental illness in her  mother; Myasthenia gravis in her father; Thyroid disease in her daughter and mother.  Wt Readings from Last 3 Encounters:  12/08/17 200 lb (90.7 kg)  11/20/17 199 lb (90.3 kg)  11/10/17 202 lb (91.6 kg)    PHYSICAL EXAM BP (!) 141/80   Pulse 62   Ht 5' 5.5" (1.664 m)   Wt 200 lb (90.7 kg)   BMI 32.78 kg/m  Physical Exam  Constitutional: She is oriented to person, place, and time. She appears well-developed and well-nourished. No distress.  HENT:  Head: Normocephalic and atraumatic.  Neck: Normal range of motion. No hepatojugular reflux and no JVD present. Carotid bruit is not present.  Cardiovascular: Regular rhythm, S1 normal, S2 normal and normal pulses.  No extrasystoles are present. Bradycardia present. PMI is not displaced. Exam reveals no friction rub.  Murmur heard.  Low-pitched harsh crescendo-decrescendo early systolic murmur is present with a grade of 1/6 at the upper right sternal border. Pulmonary/Chest: Effort normal and breath sounds normal. No respiratory distress. She has no wheezes. She has no rales.  Musculoskeletal: Normal range of motion. She exhibits edema (Trivial).  Neurological: She is alert and oriented to person, place, and time.  Psychiatric: She has a normal mood and affect. Her behavior is normal. Judgment and thought content normal.  Nursing note and vitals reviewed.    Adult ECG Report None  Other studies Reviewed: Additional studies/ records that were reviewed today include:  Recent Labs:    Lab Results  Component Value Date   CHOL 261 (H) 10/21/2017   HDL 83 10/21/2017   LDLCALC 152 (H) 10/21/2017   TRIG 131 10/21/2017   CHOLHDL 3.1 10/21/2017    ASSESSMENT / PLAN: Problem List Items Addressed This Visit    Pure hypercholesterolemia (Chronic)    Based on the presence of aortic stenosis and family history of premature CAD.  Target LDL level should be less than 100.  We checked her LDL is 152.  My understanding was that her discussion  with PCP was to try dietary adjustment and lifestyle modification.  More than likely with an LDL of 152, she will not reach target of 100 without medical management.  I told her that there is a high likelihood of initiating statin versus may be potentially Zetia or Bempedoic Acid.      Relevant Medications   triamterene-hydrochlorothiazide (DYAZIDE) 37.5-25 MG capsule   New onset right bundle branch block (RBBB) - Primary (Chronic)    Likely slow progression of conduction disorder.  I spent at least 25 minutes explaining the pathophysiology of this plus the aortic valve stenosis noted. With normal coronary calcium score this is less likely to be ischemic related, and with normal echo with no wall motion normality there is no prior infarct.  Likely a benign finding.      Relevant Medications   triamterene-hydrochlorothiazide (DYAZIDE) 37.5-25 MG capsule   Other Relevant Orders   ECHOCARDIOGRAM COMPLETE   Mild aortic stenosis (Chronic)    Her exam sounded like at least aortic sclerosis, but the echocardiogram suggested mild aortic stenosis.  I spent over half of the 25 minutes with her explaining the pathophysiology of aortic stenosis and that if her strength stenosis and sclerosis.  We also discussed the natural progression of this condition.    Plan for now will be to have her come back for 2D echocardiogram in 2 years to reassess.  I will then see her back after that.      Relevant Medications   triamterene-hydrochlorothiazide (DYAZIDE) 37.5-25 MG capsule   Other Relevant Orders   ECHOCARDIOGRAM COMPLETE   Family history of premature CAD    Low likelihood of having significant ischemic CAD given the coronary calcium score of 0.      Relevant Orders   ECHOCARDIOGRAM COMPLETE     I spent at least 25-30 minutes with the patient explaining the results of her studies and the pathophysiology of aortic valve disease as well as right bundle branch block.  She and her daughter had multiple  questions.  Greater than 50% of the time was spent in direct counseling.   Current medicines are reviewed at length with the patient today. (+/- concerns) none The following changes have been made:None Patient Instructions  NO MEDICAITON INSTRUCTIONS   SCHEDULE IN 2 YEARS( 2021) AT [address]  [address] Your physician has requested that you have an echocardiogram. Echocardiography is a painless test that uses sound waves to create images of your heart. It provides your doctor with information about the size and shape of your heart and how well your heart's chambers and valves are working. This procedure takes approximately one hour. There are no restrictions for this procedure.     Your physician wants you to follow-up in march 2021 ( 2 years) with DR Angel Weedon - AFTER ECHO IS COMPLETED. You will receive a reminder letter in the mail two months in advance. If you don't receive a letter, please call our office to schedule the follow-up appointment.   If you need a refill on your cardiac medications before your next appointment, please call your pharmacy.   Studies Ordered:   Orders Placed This Encounter  Procedures  . ECHOCARDIOGRAM COMPLETE      Bryan Lemma, M.D., M.S. Interventional Cardiologist   Pager # [phone] Phone # [phone] [address] [place], [place] [address]   Thank you for choosing [facility]!!

## 2017-12-08 NOTE — Patient Instructions (Signed)
NO MEDICAITON INSTRUCTIONS   SCHEDULE IN 2 YEARS( 2021) AT [address] Your physician has requested that you have an echocardiogram. Echocardiography is a painless test that uses sound waves to create images of your heart. It provides your doctor with information about the size and shape of your heart and how well your heart's chambers and valves are working. This procedure takes approximately one hour. There are no restrictions for this procedure.     Your physician wants you to follow-up in march 2021 ( 2 years) with DR HARDING - AFTER ECHO IS COMPLETED. You will receive a reminder letter in the mail two months in advance. If you don't receive a letter, please call our office to schedule the follow-up appointment.   If you need a refill on your cardiac medications before your next appointment, please call your pharmacy.

## 2017-12-10 ENCOUNTER — Encounter: Payer: Self-pay | Admitting: Cardiology

## 2017-12-10 NOTE — Assessment & Plan Note (Signed)
Her exam sounded like at least aortic sclerosis, but the echocardiogram suggested mild aortic stenosis.  I spent over half of the 25 minutes with her explaining the pathophysiology of aortic stenosis and that if her strength stenosis and sclerosis.  We also discussed the natural progression of this condition.    Plan for now will be to have her come back for 2D echocardiogram in 2 years to reassess.  I will then see her back after that.

## 2017-12-10 NOTE — Assessment & Plan Note (Signed)
Low likelihood of having significant ischemic CAD given the coronary calcium score of 0.

## 2017-12-10 NOTE — Assessment & Plan Note (Signed)
Likely slow progression of conduction disorder.  I spent at least 25 minutes explaining the pathophysiology of this plus the aortic valve stenosis noted. With normal coronary calcium score this is less likely to be ischemic related, and with normal echo with no wall motion normality there is no prior infarct.  Likely a benign finding.

## 2017-12-10 NOTE — Assessment & Plan Note (Signed)
Based on the presence of aortic stenosis and family history of premature CAD.  Target LDL level should be less than 100.  We checked her LDL is 152.  My understanding was that her discussion with PCP was to try dietary adjustment and lifestyle modification.  More than likely with an LDL of 152, she will not reach target of 100 without medical management.  I told her that there is a high likelihood of initiating statin versus may be potentially Zetia or Bempedoic Acid.

## 2017-12-12 ENCOUNTER — Ambulatory Visit: Payer: Medicare Other | Admitting: Family Medicine

## 2017-12-12 ENCOUNTER — Encounter: Payer: Self-pay | Admitting: Family Medicine

## 2017-12-12 ENCOUNTER — Other Ambulatory Visit: Payer: Self-pay

## 2017-12-12 VITALS — BP 145/76 | HR 68 | Temp 97.8°F | Resp 18 | Ht 65.5 in | Wt 198.2 lb

## 2017-12-12 DIAGNOSIS — R5383 Other fatigue: Secondary | ICD-10-CM

## 2017-12-12 DIAGNOSIS — E7800 Pure hypercholesterolemia, unspecified: Secondary | ICD-10-CM | POA: Diagnosis not present

## 2017-12-12 DIAGNOSIS — H8102 Meniere's disease, left ear: Secondary | ICD-10-CM

## 2017-12-12 DIAGNOSIS — J069 Acute upper respiratory infection, unspecified: Secondary | ICD-10-CM

## 2017-12-12 DIAGNOSIS — J301 Allergic rhinitis due to pollen: Secondary | ICD-10-CM

## 2017-12-12 DIAGNOSIS — Z8249 Family history of ischemic heart disease and other diseases of the circulatory system: Secondary | ICD-10-CM

## 2017-12-12 DIAGNOSIS — E039 Hypothyroidism, unspecified: Secondary | ICD-10-CM

## 2017-12-12 MED ORDER — IPRATROPIUM BROMIDE 0.03 % NA SOLN: 2.0000 | Freq: Four times a day (QID) | NASAL | 1 refills | Status: DC

## 2017-12-12 NOTE — Patient Instructions (Addendum)
   IF you received an x-ray today, you will receive an invoice from [facility]. Please contact [facility] at [phone] with questions or concerns regarding your invoice.   IF you received labwork today, you will receive an invoice from [facility]. Please contact [facility] at [phone] with questions or concerns regarding your invoice.   Our billing staff will not be able to assist you with questions regarding bills from these companies.  You will be contacted with the lab results as soon as they are available. The fastest way to get your results is to activate your My Chart account. Instructions are located on the last page of this paperwork. If you have not heard from us regarding the results in 2 weeks, please contact this office.    Allergic Rhinitis, Adult Allergic rhinitis is an allergic reaction that affects the mucous membrane inside the nose. It causes sneezing, a runny or stuffy nose, and the feeling of mucus going down the back of the throat (postnasal drip). Allergic rhinitis can be mild to severe. There are two types of allergic rhinitis:  Seasonal. This type is also called hay fever. It happens only during certain seasons.  Perennial. This type can happen at any time of the year.  What are the causes? This condition happens when the body's defense system (immune system) responds to certain harmless substances called allergens as though they were germs.  Seasonal allergic rhinitis is triggered by pollen, which can come from grasses, trees, and weeds. Perennial allergic rhinitis may be caused by:  House dust mites.  Pet dander.  Mold spores.  What are the signs or symptoms? Symptoms of this condition include:  Sneezing.  Runny or stuffy nose (nasal congestion).  Postnasal drip.  Itchy nose.  Tearing of the eyes.  Trouble sleeping.  Daytime sleepiness.  How is this diagnosed? This condition may be diagnosed based on:  Your medical  history.  A physical exam.  Tests to check for related conditions, such as: ? Asthma. ? Pink eye. ? Ear infection. ? Upper respiratory infection.  Tests to find out which allergens trigger your symptoms. These may include skin or blood tests.  How is this treated? There is no cure for this condition, but treatment can help control symptoms. Treatment may include:  Taking medicines that block allergy symptoms, such as antihistamines. Medicine may be given as a shot, nasal spray, or pill.  Avoiding the allergen.  Desensitization. This treatment involves getting ongoing shots until your body becomes less sensitive to the allergen. This treatment may be done if other treatments do not help.  If taking medicine and avoiding the allergen does not work, new, stronger medicines may be prescribed.  Follow these instructions at home:  Find out what you are allergic to. Common allergens include smoke, dust, and pollen.  Avoid the things you are allergic to. These are some things you can do to help avoid allergens: ? Replace carpet with wood, tile, or vinyl flooring. Carpet can trap dander and dust. ? Do not smoke. Do not allow smoking in your home. ? Change your heating and air conditioning filter at least once a month. ? During allergy season:  Keep windows closed as much as possible.  Plan outdoor activities when pollen counts are lowest. This is usually during the evening hours.  When coming indoors, change clothing and shower before sitting on furniture or bedding.  Take over-the-counter and prescription medicines only as told by your health care provider.  Keep all   follow-up visits as told by your health care provider. This is important. Contact a health care provider if:  You have a fever.  You develop a persistent cough.  You make whistling sounds when you breathe (you wheeze).  Your symptoms interfere with your normal daily activities. Get help right away if:  You  have shortness of breath. Summary  This condition can be managed by taking medicines as directed and avoiding allergens.  Contact your health care provider if you develop a persistent cough or fever.  During allergy season, keep windows closed as much as possible. This information is not intended to replace advice given to you by your health care provider. Make sure you discuss any questions you have with your health care provider. Document Released: 06/10/2001 Document Revised: 10/23/2016 Document Reviewed: 10/23/2016 Elsevier Interactive Patient Education  2018 [employer]. Fat and Cholesterol Restricted Diet High levels of fat and cholesterol in your blood may lead to various health problems, such as diseases of the heart, blood vessels, gallbladder, liver, and pancreas. Fats are concentrated sources of energy that come in various forms. Certain types of fat, including saturated fat, may be harmful in excess. Cholesterol is a substance needed by your body in small amounts. Your body makes all the cholesterol it needs. Excess cholesterol comes from the food you eat. When you have high levels of cholesterol and saturated fat in your blood, health problems can develop because the excess fat and cholesterol will gather along the walls of your blood vessels, causing them to narrow. Choosing the right foods will help you control your intake of fat and cholesterol. This will help keep the levels of these substances in your blood within normal limits and reduce your risk of disease. What is my plan? Your health care provider recommends that you:  Limit your fat intake to ______% or less of your total calories per day.  Limit the amount of cholesterol in your diet to less than _________mg per day.  Eat 20-30 grams of fiber each day.  What types of fat should I choose?  Choose healthy fats more often. Choose monounsaturated and polyunsaturated fats, such as olive and canola oil, flaxseeds,  walnuts, almonds, and seeds.  Eat more omega-3 fats. Good choices include salmon, mackerel, sardines, tuna, flaxseed oil, and ground flaxseeds. Aim to eat fish at least two times a week.  Limit saturated fats. Saturated fats are primarily found in animal products, such as meats, butter, and cream. Plant sources of saturated fats include palm oil, palm kernel oil, and coconut oil.  Avoid foods with partially hydrogenated oils in them. These contain trans fats. Examples of foods that contain trans fats are stick margarine, some tub margarines, cookies, crackers, and other baked goods. What general guidelines do I need to follow? These guidelines for healthy eating will help you control your intake of fat and cholesterol:  Check food labels carefully to identify foods with trans fats or high amounts of saturated fat.  Fill one half of your plate with vegetables and green salads.  Fill one fourth of your plate with whole grains. Look for the word "whole" as the first word in the ingredient list.  Fill one fourth of your plate with lean protein foods.  Limit fruit to two servings a day. Choose fruit instead of juice.  Eat more foods that contain fiber, such as apples, broccoli, carrots, beans, peas, and barley.  Eat more home-cooked food and less restaurant, buffet, and fast food.  Limit or  avoid alcohol.  Limit foods high in starch and sugar.  Limit fried foods.  Cook foods using methods other than frying. Baking, boiling, grilling, and broiling are all great options.  Lose weight if you are overweight. Losing just 5-10% of your initial body weight can help your overall health and prevent diseases such as diabetes and heart disease.  What foods can I eat? Grains  Whole grains, such as whole wheat or whole grain breads, crackers, cereals, and pasta. Unsweetened oatmeal, bulgur, barley, quinoa, or brown rice. Corn or whole wheat flour tortillas. Vegetables  Fresh or frozen  vegetables (raw, steamed, roasted, or grilled). Green salads. Fruits  All fresh, canned (in natural juice), or frozen fruits. Meats and other protein foods  Ground beef (85% or leaner), grass-fed beef, or beef trimmed of fat. Skinless chicken or [place]. Ground chicken or [place]. Pork trimmed of fat. All fish and seafood. Eggs. Dried beans, peas, or lentils. Unsalted nuts or seeds. Unsalted canned or dry beans. Dairy  Low-fat dairy products, such as skim or 1% milk, 2% or reduced-fat cheeses, low-fat ricotta or cottage cheese, or plain low-fat yo Fats and oils  Tub margarines without trans fats. Light or reduced-fat mayonnaise and salad dressings. Avocado. Olive, canola, sesame, or safflower oils. Natural peanut or almond butter (choose ones without added sugar and oil). The items listed above may not be a complete list of recommended foods or beverages. Contact your dietitian for more options. Foods to avoid Grains  White bread. White pasta. White rice. Cornbread. Bagels, pastries, and croissants. Crackers that contain trans fat. Vegetables  White potatoes. Corn. Creamed or fried vegetables. Vegetables in a cheese sauce. Fruits  Dried fruits. Canned fruit in light or heavy syrup. Fruit juice. Meats and other protein foods  Fatty cuts of meat. Ribs, chicken wings, bacon, sausage, bologna, salami, chitterlings, fatback, hot dogs, bratwurst, and packaged luncheon meats. Liver and organ meats. Dairy  Whole or 2% milk, cream, half-and-half, and cream cheese. Whole milk cheeses. Whole-fat or sweetened yogurt. Full-fat cheeses. Nondairy creamers and whipped toppings. Processed cheese, cheese spreads, or cheese curds. Beverages  Alcohol. Sweetened drinks (such as sodas, lemonade, and fruit drinks or punches). Fats and oils  Butter, stick margarine, lard, shortening, ghee, or bacon fat. Coconut, palm kernel, or palm oils. Sweets and desserts  Corn syrup, sugars, honey, and molasses.  Candy. Jam and jelly. Syrup. Sweetened cereals. Cookies, pies, cakes, donuts, muffins, and ice cream. The items listed above may not be a complete list of foods and beverages to avoid. Contact your dietitian for more information. This information is not intended to replace advice given to you by your health care provider. Make sure you discuss any questions you have with your health care provider. Document Released: 09/15/2005 Document Revised: 10/06/2014 Document Reviewed: 12/14/2013 Elsevier Interactive Patient Education  2018 [employer]  Mediterranean Diet A Mediterranean diet refers to food and lifestyle choices that are based on the traditions of countries located on the [place]. This way of eating has been shown to help prevent certain conditions and improve outcomes for people who have chronic diseases, like kidney disease and heart disease. What are tips for following this plan? Lifestyle  Cook and eat meals together with your family, when possible.  Drink enough fluid to keep your urine clear or pale yellow.  Be physically active every day. This includes: ? Aerobic exercise like running or swimming. ? Leisure activities like gardening, walking, or housework.  Get 7-8 hours of sleep each  night.  If recommended by your health care provider, drink red wine in moderation. This means 1 glass a day for nonpregnant women and 2 glasses a day for men. A glass of wine equals 5 oz (150 mL). Reading food labels  Check the serving size of packaged foods. For foods such as rice and pasta, the serving size refers to the amount of cooked product, not dry.  Check the total fat in packaged foods. Avoid foods that have saturated fat or trans fats.  Check the ingredients list for added sugars, such as corn syrup. Shopping  At the grocery store, buy most of your food from the areas near the walls of the store. This includes: ? Fresh fruits and vegetables (produce). ? Grains, beans,  nuts, and seeds. Some of these may be available in unpackaged forms or large amounts (in bulk). ? Fresh seafood. ? Poultry and eggs. ? Low-fat dairy products.  Buy whole ingredients instead of prepackaged foods.  Buy fresh fruits and vegetables in-season from local farmers markets.  Buy frozen fruits and vegetables in resealable bags.  If you do not have access to quality fresh seafood, buy precooked frozen shrimp or canned fish, such as tuna, salmon, or sardines.  Buy small amounts of raw or cooked vegetables, salads, or olives from the deli or salad bar at your store.  Stock your pantry so you always have certain foods on hand, such as olive oil, canned tuna, canned tomatoes, rice, pasta, and beans. Cooking  Cook foods with extra-virgin olive oil instead of using butter or other vegetable oils.  Have meat as a side dish, and have vegetables or grains as your main dish. This means having meat in small portions or adding small amounts of meat to foods like pasta or stew.  Use beans or vegetables instead of meat in common dishes like chili or lasagna.  Experiment with different cooking methods. Try roasting or broiling vegetables instead of steaming or sauteing them.  Add frozen vegetables to soups, stews, pasta, or rice.  Add nuts or seeds for added healthy fat at each meal. You can add these to yogurt, salads, or vegetable dishes.  Marinate fish or vegetables using olive oil, lemon juice, garlic, and fresh herbs. Meal planning  Plan to eat 1 vegetarian meal one day each week. Try to work up to 2 vegetarian meals, if possible.  Eat seafood 2 or more times a week.  Have healthy snacks readily available, such as: ? Vegetable sticks with hummus. ? [place] yogurt. ? Fruit and nut trail mix.  Eat balanced meals throughout the week. This includes: ? Fruit: 2-3 servings a day ? Vegetables: 4-5 servings a day ? Low-fat dairy: 2 servings a day ? Fish, poultry, or lean meat: 1  serving a day ? Beans and legumes: 2 or more servings a week ? Nuts and seeds: 1-2 servings a day ? Whole grains: 6-8 servings a day ? Extra-virgin olive oil: 3-4 servings a day  Limit red meat and sweets to only a few servings a month What are my food choices?  Mediterranean diet ? Recommended ? Grains: Whole-grain pasta. Brown rice. Bulgar wheat. Polenta. Couscous. Whole-wheat bread. Orpah Cobb. ? Vegetables: Artichokes. Beets. Broccoli. Cabbage. Carrots. Eggplant. Green beans. Chard. Kale. Spinach. Onions. Leeks. Peas. Squash. Tomatoes. Peppers. Radishes. ? Fruits: Apples. Apricots. Avocado. Berries. Bananas. Cherries. Dates. Figs. Grapes. Lemons. Melon. Oranges. Peaches. Plums. Pomegranate. ? Meats and other protein foods: Beans. Almonds. Sunflower seeds. Pine nuts. Peanuts. Cod. Salmon. Scallops.  Shrimp. Tuna. Tilapia. Clams. Oysters. Eggs. ? Dairy: Low-fat milk. Cheese. Greek yogurt. ? Beverages: Water. Red wine. Herbal tea. ? Fats and oils: Extra virgin olive oil. Avocado oil. Grape seed oil. ? Sweets and desserts: [place] yogurt with honey. Baked apples. Poached pears. Trail mix. ? Seasoning and other foods: Basil. Cilantro. Coriander. Cumin. Mint. Parsley. Sage. Rosemary. Tarragon. Garlic. Oregano. Thyme. Pepper. Balsalmic vinegar. Tahini. Hummus. Tomato sauce. Olives. Mushrooms. ? Limit these ? Grains: Prepackaged pasta or rice dishes. Prepackaged cereal with added sugar. ? Vegetables: Deep fried potatoes (french fries). ? Fruits: Fruit canned in syrup. ? Meats and other protein foods: Beef. Pork. Lamb. Poultry with skin. Hot dogs. Tomasa Blase. ? Dairy: Ice cream. Sour cream. Whole milk. ? Beverages: Juice. Sugar-sweetened soft drinks. Beer. Liquor and spirits. ? Fats and oils: Butter. Canola oil. Vegetable oil. Beef fat (tallow). Lard. ? Sweets and desserts: Cookies. Cakes. Pies. Candy. ? Seasoning and other foods: Mayonnaise. Premade sauces and marinades. ? The items listed may  not be a complete list. Talk with your dietitian about what dietary choices are right for you. Summary  The Mediterranean diet includes both food and lifestyle choices.  Eat a variety of fresh fruits and vegetables, beans, nuts, seeds, and whole grains.  Limit the amount of red meat and sweets that you eat.  Talk with your health care provider about whether it is safe for you to drink red wine in moderation. This means 1 glass a day for nonpregnant women and 2 glasses a day for men. A glass of wine equals 5 oz (150 mL). This information is not intended to replace advice given to you by your health care provider. Make sure you discuss any questions you have with your health care provider. Document Released: 05/08/2016 Document Revised: 06/10/2016 Document Reviewed: 05/08/2016 Elsevier Interactive Patient Education  [employer].

## 2017-12-12 NOTE — Progress Notes (Signed)
Subjective:    Patient ID: Elizabeth Lin, female    DOB: Aug 14, 1946, 72 y.o.   MRN: [id number] Chief Complaint  Patient presents with  . Cough    cough, x 1 week, hoarsness comes and goes    HPI   After she saw Dr. Neva SeatGreene, she was then seen by Dr. Lovey NewcomerKrause, ENT, and diagnosed with Meniere's disease - started on dyazide and put on low sodium diet 2000mg  diet - she is cooking all of her own food - baking or boiling her meat, not adding any salt ever.   Has had hearing loss and a hoarse voice and a cough for a week. Took a cetrizine once.   Past Medical History:  Diagnosis Date  . Allergy   . Anxiety   . Arthritis   . Cataract   . Depression   . Hammer toe   . Hypothyroidism due to Hashimoto's thyroiditis   . Thyroid disease    Past Surgical History:  Procedure Laterality Date  . APPENDECTOMY    . CYSTECTOMY     eye  . OOPHORECTOMY Right   . TONSILLECTOMY    . TRANSTHORACIC ECHOCARDIOGRAM  10/2016   No regional wall motion normality.  GR 1 DD.  Mild aortic stenosis (mean gradient 9 mmHg).  Mild LA dilation.  November 2018   Current Outpatient Medications on File Prior to Visit  Medication Sig Dispense Refill  . BUDESONIDE NA Place into the nose.    . Calcium-Vitamin D-Vitamin K (CALCIUM + D + K PO) Take 1,500 mg by mouth daily. 1500 mg+D1000 IU    . diazepam (VALIUM) 5 MG tablet as needed for dizziness.    . DULoxetine (CYMBALTA) 30 MG capsule TAKE 1 CAPSULE BY MOUTH EVERY DAY 90 capsule 3  . fish oil-omega-3 fatty acids 1000 MG capsule Take 2 g by mouth daily.    Marland Kitchen. GLUCOSAMINE CHONDROITIN COMPLX PO Take 2,000 Units by mouth daily.    Marland Kitchen. levothyroxine (SYNTHROID, LEVOTHROID) 150 MCG tablet Take 1 tablet (150 mcg total) by mouth daily. 90 tablet 3  . triamterene-hydrochlorothiazide (DYAZIDE) 37.5-25 MG capsule daily.    . Cholecalciferol (VITAMIN D-3 PO) Take 1 tablet by mouth daily.     No current facility-administered medications on file prior to visit.    Allergies   Allergen Reactions  . Amoxicillin-Pot Clavulanate     Red rash   Family History  Problem Relation Age of Onset  . Diabetes Father   . Myasthenia gravis Father        Died from complications in his 2050s  . CAD Father        Was too sick with myasthenia gravis to have CABG  . Hypertension Father   . Thyroid disease Mother   . Cancer Mother        Lung -died at age 48-1/2.  Marland Kitchen. Depression Mother   . Mental illness Mother   . Hypertension Brother        Since age 73  . Hyperlipidemia Brother   . Thyroid disease Daughter   . Depression Son   . Heart disease Maternal Grandmother   . Heart disease Paternal Grandmother   . Heart disease Paternal Grandfather    Social History   Socioeconomic History  . Marital status: Single    Spouse name: None  . Number of children: 3  . Years of education: college  . Highest education level: Bachelor's degree (e.g., BA, AB, BS)  Social Needs  . Physicist, medicalinancial resource  strain: None  . Food insecurity - worry: None  . Food insecurity - inability: None  . Transportation needs - medical: None  . Transportation needs - non-medical: None  Occupational History  . 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 Smoking status: Former Smoker    Types: Cigarettes    Last attempt to quit: 11/01/1974    Years since quitting: 43.1  . Smokeless tobacco: Former Neurosurgeon    Quit date: 06/11/1982  Substance and Sexual Activity  . Alcohol use: No    Comment: occasional wine  . Drug use: No  . Sexual activity: No  Other Topics Concern  . None  Social History Narrative   Patient is Divorced and lives with oldest daughter and grandson.  3 children, 3 grandchildren.   Patient's  Education: [employer].  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   Patient is right handed   Caffeine consumption 2-3 daily    Walks daily for roughly 30 minutes at  a time.  She has not been doing it in the wintertime due to cold weather.      Depression screen [facility] 2/9 12/12/2017 11/20/2017 10/31/2016 10/27/2016 09/04/2016  Decreased Interest 0 0 0 0 0  Down, Depressed, Hopeless 0 0 0 0 0  PHQ - 2 Score 0 0 0 0 0  Altered sleeping - - - - -  Tired, decreased energy - - - - -  Change in appetite - - - - -  Feeling bad or failure about yourself  - - - - -  Trouble concentrating - - - - -  Moving slowly or fidgety/restless - - - - -  Suicidal thoughts - - - - -  PHQ-9 Score - - - - -  Difficult doing work/chores - - - - -      Review of Systems     Objective:   Physical Exam    There were no vitals taken for this visit.     Assessment & Plan:  No diagnosis found.  No orders of the defined types were placed in this encounter.   No orders of the defined types were placed in this encounter. restart cetirizine every night.  I personally performed the services described in this documentation, which was scribed in my presence. The recorded information has been reviewed and considered, and addended by me as needed.   Norberto Sorenson, M.D.  Primary Care at [facility] [address] [place], [place] [address] [phone] phone [phone] fax  12/12/17 12:31 PM

## 2018-01-25 ENCOUNTER — Ambulatory Visit: Payer: Medicare Other | Admitting: Family Medicine

## 2018-02-20 ENCOUNTER — Other Ambulatory Visit: Payer: Self-pay | Admitting: Family Medicine

## 2018-04-28 ENCOUNTER — Other Ambulatory Visit: Payer: Self-pay

## 2018-04-28 ENCOUNTER — Ambulatory Visit: Payer: Medicare Other | Admitting: Family Medicine

## 2018-04-28 ENCOUNTER — Encounter: Payer: Self-pay | Admitting: Family Medicine

## 2018-04-28 VITALS — BP 131/76 | HR 60 | Temp 98.4°F | Resp 16 | Ht 66.73 in | Wt 204.0 lb

## 2018-04-28 DIAGNOSIS — J069 Acute upper respiratory infection, unspecified: Secondary | ICD-10-CM | POA: Diagnosis not present

## 2018-04-28 MED ORDER — AZITHROMYCIN 250 MG PO TABS: ORAL_TABLET | ORAL | 0 refills | Status: DC

## 2018-04-28 MED ORDER — HYDROCODONE-HOMATROPINE 5-1.5 MG/5ML PO SYRP: 5.0000 mL | ORAL_SOLUTION | Freq: Three times a day (TID) | ORAL | 0 refills | Status: DC | PRN

## 2018-04-28 MED ORDER — MUCINEX DM MAXIMUM STRENGTH 60-1200 MG PO TB12: 1.0000 | ORAL_TABLET | Freq: Two times a day (BID) | ORAL | 1 refills | Status: DC

## 2018-04-28 NOTE — Patient Instructions (Addendum)
Rhinocort 2 sprays at night before bed and the ipratropium 4 times during the day. Mucinex DM twice a day. Hycodan at night as needed for cough and sleep.  Some homemade cough syrups can be just as effective as the over-the-counter medications with much fewer side effects. Also they can be combined with over-the-counter and prescription cough regimens, to make them even more effective together. Below is a recipe for my favorite:  Sweet lemon, honey, and thyme cough syrup recipe Place half of a chopped lemon in a container and cover with half a cup of honey (raw is best but any will do).  Then boil a handful of fresh thyme sprigs or organic dry leaves with 2 cups of water uncovered until it is reduced to one cup. Strain this liquid into the jar with the lemon and honey to remove the steams and leaves and then shake it up. Use 1 teaspoonful as often as needed and it will store in the refrigerator for at least a month.    IF you received an x-ray today, you will receive an invoice from [facility]. Please contact [facility] at [phone] with questions or concerns regarding your invoice.   IF you received labwork today, you will receive an invoice from [place]. Please contact [facility] at [phone] with questions or concerns regarding your invoice.   Our billing staff will not be able to assist you with questions regarding bills from these companies.  You will be contacted with the lab results as soon as they are available. The fastest way to get your results is to activate your My Chart account. Instructions are located on the last page of this paperwork. If you have not heard from [place] regarding the results in 2 weeks, please contact this office.      Upper Respiratory Infection, Adult Most upper respiratory infections (URIs) are caused by a virus. A URI affects the nose, throat, and upper air passages. The most common type of URI is often called "the common  cold." Follow these instructions at home:  Take medicines only as told by your doctor.  Gargle warm saltwater or take cough drops to comfort your throat as told by your doctor.  Use a warm mist humidifier or inhale steam from a shower to increase air moisture. This may make it easier to breathe.  Drink enough fluid to keep your pee (urine) clear or pale yellow.  Eat soups and other clear broths.  Have a healthy diet.  Rest as needed.  Go back to work when your fever is gone or your doctor says it is okay. ? You may need to stay home longer to avoid giving your URI to others. ? You can also wear a face mask and wash your hands often to prevent spread of the virus.  Use your inhaler more if you have asthma.  Do not use any tobacco products, including cigarettes, chewing tobacco, or electronic cigarettes. If you need help quitting, ask your doctor. Contact a doctor if:  You are getting worse, not better.  Your symptoms are not helped by medicine.  You have chills.  You are getting more short of breath.  You have brown or red mucus.  You have yellow or brown discharge from your nose.  You have pain in your face, especially when you bend forward.  You have a fever.  You have puffy (swollen) neck glands.  You have pain while swallowing.  You have white areas in the back of your throat.  Get help right away if:  You have very bad or constant: ? Headache. ? Ear pain. ? Pain in your forehead, behind your eyes, and over your cheekbones (sinus pain). ? Chest pain.  You have long-lasting (chronic) lung disease and any of the following: ? Wheezing. ? Long-lasting cough. ? Coughing up blood. ? A change in your usual mucus.  You have a stiff neck.  You have changes in your: ? Vision. ? Hearing. ? Thinking. ? Mood. This information is not intended to replace advice given to you by your health care provider. Make sure you discuss any questions you have with your  health care provider. Document Released: 03/03/2008 Document Revised: 05/18/2016 Document Reviewed: 12/21/2013 Elsevier Interactive Patient Education  2018 [employer]

## 2018-04-28 NOTE — Progress Notes (Signed)
I,[name],acting as a scribe for [name], MD.,have documented all relevant documentation on the behalf of Sherren MochaEva N Elizabeth Peyser, MD,as directed by  Sherren MochaEva N [name], MD while in the presence of Sherren MochaEva N [name], MD. 04/28/2018  5:28 PM  Subjective:    Patient ID: Elizabeth Lin, female    DOB: Jan 15, 1946, 72 y.o.   MRN: [id number]  Chief Complaint  Patient presents with  . Cough    x 4 days with some head pressure  . Nasal Congestion    HPI Elizabeth [name] is a 72 y.o. female who [facility] to [facility] at [facility] complaining of sinus pressure and thick throat. She coughed up phlegm, but she doesn't know what color it was. She notes that her daughter has been sick. She started taking decongestant and Nyquil and Rhinocort nasal spray 4 times per day. Tylenol PM/DM. She did not sleep well last night due to being up with a cough. She has tried [employer] pearls, but this doesn't work for her.     Patient Active Problem List   Diagnosis Date Noted  . Meniere disease, left 12/12/2017  . Mild aortic stenosis 11/10/2017  . Bilateral edema of lower extremity 11/10/2017  . Family history of premature CAD 11/10/2017  . Arthralgia of multiple joints 10/23/2017  . Class 1 obesity due to excess calories without serious comorbidity with body mass index (BMI) of 32.0 to 32.9 in adult 10/23/2017  . Elevated blood pressure reading 10/23/2017  . New onset right bundle branch block (RBBB) 10/23/2017  . Pure hypercholesterolemia 10/23/2017  . Vitamin D deficiency 09/06/2014  . Right knee pain 02/24/2013  . Hypothyroid 11/02/2011  . Depression 11/02/2011  . 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 Allergy   . Anxiety   . Arthritis   . Cataract   . Depression   . Hammer toe   . Hypothyroidism due to Hashimoto's thyroiditis   . Thyroid disease    Past Surgical History:  Procedure Laterality Date  . APPENDECTOMY    . CYSTECTOMY     eye  . OOPHORECTOMY Right     . TONSILLECTOMY    . TRANSTHORACIC ECHOCARDIOGRAM  10/2016   No regional wall motion normality.  GR 1 DD.  Mild aortic stenosis (mean gradient 9 mmHg).  Mild LA dilation.  November 2018   Allergies  Allergen Reactions  . Penicillins Rash  . Amoxicillin-Pot Clavulanate     Red rash   Prior to Admission medications   Medication Sig Start Date End Date Taking? Authorizing Provider  BUDESONIDE NA Place into the nose.   Yes [provider]  Calcium-Vitamin D-Vitamin K (CALCIUM + D + K PO) Take 1,500 mg by mouth daily. 1500 mg+D1000 IU   Yes [provider]  Cholecalciferol (VITAMIN D-3 PO) Take 1 tablet by mouth daily.   Yes [provider]  diazepam (VALIUM) 5 MG tablet as needed for dizziness. 11/26/17  Yes [provider]  DULoxetine (CYMBALTA) 30 MG capsule TAKE 1 CAPSULE BY MOUTH EVERY DAY 10/21/17  Yes Sherren MochaShaw, Lakenzie Mcclafferty N, MD  fish oil-omega-3 fatty acids 1000 MG capsule Take 2 g by mouth daily.   Yes [provider]  GLUCOSAMINE CHONDROITIN COMPLX PO Take 2,000 Units by mouth daily.   Yes [provider]  ipratropium (ATROVENT) 0.03 % nasal spray PLACE 2 SPRAYS INTO THE NOSE 4 (FOUR) TIMES DAILY. 02/23/18  Yes Sherren MochaShaw, Obbie Lewallen N, MD  levothyroxine (  SYNTHROID, LEVOTHROID) 150 MCG tablet Take 1 tablet (150 mcg total) by mouth daily. 10/23/17  Yes Sherren Mocha, MD  triamterene-hydrochlorothiazide (DYAZIDE) 37.5-25 MG capsule daily. 11/26/17  Yes [provider]   Social History   Socioeconomic History  . Marital status: Single    Spouse name: Not on file  . Number of children: 3  . Years of education: college  . Highest education level: Bachelor's degree (e.g., BA, AB, BS)  Occupational History  . 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 Financial resource strain: Not on file  . Food insecurity:    Worry: Not on file    Inability: Not on file  . Transportation needs:    Medical:  Not on file    Non-medical: Not on file  Tobacco Use  . Smoking status: Former Smoker    Types: Cigarettes    Last attempt to quit: 11/01/1974    Years since quitting: 43.5  . Smokeless tobacco: Former Neurosurgeon    Quit date: 06/11/1982  Substance and Sexual Activity  . Alcohol use: No    Comment: occasional wine  . Drug use: No  . Sexual activity: Never  Lifestyle  . Physical activity:    Days per week: Not on file    Minutes per session: Not on file  . Stress: Not on file  Relationships  . Social connections:    Talks on phone: Not on file    Gets together: Not on file    Attends religious service: Not on file    Active member of club or organization: Not on file    Attends meetings of clubs or organizations: Not on file    Relationship status: Not on file  . Intimate partner violence:    Fear of current or ex partner: Not on file    Emotionally abused: Not on file    Physically abused: Not on file    Forced sexual activity: Not on file  Other Topics Concern  . Not on file  Social History Narrative   Patient is Divorced and lives with oldest daughter and grandson.  3 children, 3 grandchildren.   Patient's  Education: [employer].  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   Patient is right handed   Caffeine consumption 2-3 daily    Walks daily for roughly 30 minutes at a time.  She has not been doing it in the wintertime due to cold weather.       Review of Systems  Constitutional: Negative for activity change, chills, fatigue and fever.  HENT: Positive for congestion, rhinorrhea, sinus pressure and sinus pain. Negative for sore throat.   Respiratory: Positive for cough. Negative for chest tightness.   Cardiovascular: Negative for chest pain and palpitations.  Gastrointestinal: Negative for abdominal pain, constipation and nausea.  Genitourinary: Negative for dysuria, frequency and urgency.   Musculoskeletal: Negative for arthralgias and myalgias.  Neurological: Negative for dizziness, light-headedness and headaches.  Psychiatric/Behavioral: Negative for agitation and dysphoric mood. The patient is not nervous/anxious.   All other systems reviewed and are negative.      Objective:   Physical Exam  HENT:  Right Ear: Tympanic membrane is erythematous and retracted.  Left Ear: Tympanic membrane is erythematous and retracted.  Nose: Mucosal edema present.  Mouth/Throat: Oropharynx is clear and moist.  Eyes: Pupils are equal, round, and reactive to light. EOM are  normal.  Cardiovascular: Normal rate and regular rhythm.  Murmur heard.  Systolic murmur is present. Pulmonary/Chest: Effort normal and breath sounds normal.  Lymphadenopathy:       Head (right side): Tonsillar adenopathy present.       Head (left side): No tonsillar adenopathy present.    She has no cervical adenopathy.    She has no axillary adenopathy.       Right: No supraclavicular adenopathy present.       Left: No supraclavicular adenopathy present.    Vitals:   04/28/18 1727  BP: 131/76  Pulse: 60  Resp: 16  Temp: 98.4 F (36.9 C)  SpO2: 97%       Assessment & Plan:   No diagnosis found.  See AVS for detailed pt instructions - suspect currently viral but gave snap rx in case sxs worsen and develop signs of secondary bacterial infection  Meds ordered this encounter  Medications  . Dextromethorphan-guaiFENesin (MUCINEX DM MAXIMUM STRENGTH) 60-1200 MG TB12    Sig: Take 1 tablet by mouth every 12 (twelve) hours.    Dispense:  14 each    Refill:  1  . HYDROcodone-homatropine (HYCODAN) 5-1.5 MG/5ML syrup    Sig: Take 5 mLs by mouth every 8 (eight) hours as needed for cough.    Dispense:  120 mL    Refill:  0  . DISCONTD: azithromycin (ZITHROMAX) 250 MG tablet    Sig: Take 2 tabs PO x 1 dose, then 1 tab PO QD x 4 days    Dispense:  6 tablet    Refill:  0  . azithromycin (ZITHROMAX) 250 MG  tablet    Sig: Take 2 tabs PO x 1 dose, then 1 tab PO QD x 4 days    Dispense:  6 tablet    Refill:  0    I personally performed the services described in this documentation, which was scribed in my presence. The recorded information has been reviewed and considered, and addended by me as needed.   Norberto Sorenson, M.D.  Primary Care at [facility] [address] [place], [place] [address] [phone] phone [phone] fax  06/24/18 3:02 AM

## 2018-04-30 ENCOUNTER — Ambulatory Visit: Payer: Self-pay | Admitting: *Deleted

## 2018-04-30 NOTE — Telephone Encounter (Signed)
Pt was seen at the office on 04/28/18  Regarding symptoms of UR and nasal congestion. Pt was prescribed an antibiotic to take if she wasn't feeling better to get the prescription filled and take the antibiotic. She stated that she feels somewhat better but still with the congestion. Advised her to go ahead and start the antibiotic to see if that helps. Pt voiced understanding. Will route to flow at [facility] at [facility].   Reason for Disposition . General information question, no triage required and triager able to answer question  Answer Assessment - Initial Assessment Questions 1. REASON FOR CALL or QUESTION: "What is your reason for calling today?" or "How can I best help you?" or "What question do you have that I can help answer?"     She wanted to know if she should take her antibiotic.  Protocols used: INFORMATION ONLY CALL-A-AH

## 2018-07-26 ENCOUNTER — Encounter: Payer: Self-pay | Admitting: Family Medicine

## 2018-10-17 ENCOUNTER — Other Ambulatory Visit: Payer: Self-pay | Admitting: Family Medicine

## 2018-10-18 NOTE — Telephone Encounter (Signed)
Please Advise  Patient is requesting a refill of the following medications: Requested Prescriptions   Pending Prescriptions Disp Refills  . DULoxetine (CYMBALTA) 30 MG capsule [Pharmacy Med Name: DULOXETINE HCL DR 30 MG CAP] 90 capsule 3    Sig: TAKE 1 CAPSULE BY MOUTH EVERY DAY

## 2018-10-18 NOTE — Telephone Encounter (Signed)
Attempted to call patient to schedule an appointment for refills. No answer, left message to call the office and schedule. Prescription for Duloxetine expires on 10/21/2018. Pt of Dr. Clelia CroftShaw. LOV  04/28/18

## 2018-11-16 ENCOUNTER — Other Ambulatory Visit: Payer: Self-pay | Admitting: Family Medicine

## 2018-11-16 MED ORDER — DULOXETINE HCL 30 MG PO CPEP: 30.0000 mg | ORAL_CAPSULE | Freq: Every day | ORAL | 0 refills | Status: DC

## 2018-11-19 ENCOUNTER — Ambulatory Visit: Payer: Medicare Other | Admitting: Family Medicine

## 2018-11-19 ENCOUNTER — Other Ambulatory Visit: Payer: Self-pay

## 2018-11-19 ENCOUNTER — Encounter: Payer: Self-pay | Admitting: Family Medicine

## 2018-11-19 VITALS — BP 150/88 | HR 66 | Temp 98.0°F | Ht 66.0 in | Wt 215.8 lb

## 2018-11-19 DIAGNOSIS — E039 Hypothyroidism, unspecified: Secondary | ICD-10-CM | POA: Diagnosis not present

## 2018-11-19 DIAGNOSIS — R030 Elevated blood-pressure reading, without diagnosis of hypertension: Secondary | ICD-10-CM

## 2018-11-19 DIAGNOSIS — Z5181 Encounter for therapeutic drug level monitoring: Secondary | ICD-10-CM | POA: Diagnosis not present

## 2018-11-19 DIAGNOSIS — E7800 Pure hypercholesterolemia, unspecified: Secondary | ICD-10-CM | POA: Diagnosis not present

## 2018-11-19 MED ORDER — LEVOTHYROXINE SODIUM 150 MCG PO TABS: 150.0000 ug | ORAL_TABLET | Freq: Every day | ORAL | 3 refills | Status: DC

## 2018-11-19 MED ORDER — DULOXETINE HCL 30 MG PO CPEP: 30.0000 mg | ORAL_CAPSULE | Freq: Every day | ORAL | 1 refills | Status: DC

## 2018-11-19 NOTE — Patient Instructions (Addendum)
Thank you for coming in today.  No change in Synthroid or Cymbalta doses for now.  I will check some blood work including electrolytes but would recommend you discuss the diuretic with your ear nose and throat specialist.  Please bring a copy of those medications next time so we can update your list.  Keep up the good work with exercise and we can check cholesterol and blood pressure further at follow-up in 6 weeks.  Bumps on the wrist appear to be possible cysts and not concerning but I would like to look at those further as well.  Keep a record of your blood pressures outside of the office and bring them to the next office visit.  Return to the clinic or go to the nearest emergency room if any of your symptoms worsen or new symptoms occur.    If you have lab work done today you will be contacted with your lab results within the next 2 weeks.  If you have not heard from [place] then please contact us. The fastest way to get your results is to register for My Chart.   IF you received an x-ray today, you will receive an invoice from [facility]. Please contact [facility] Radiology at [phone] with questions or concerns regarding your invoice.   IF you received labwork today, you will receive an invoice from [place]. Please contact [facility] at [phone] with questions or concerns regarding your invoice.   Our billing staff will not be able to assist you with questions regarding bills from these companies.  You will be contacted with the lab results as soon as they are available. The fastest way to get your results is to activate your My Chart account. Instructions are located on the last page of this paperwork. If you have not heard from [place] regarding the results in 2 weeks, please contact this office.

## 2018-11-19 NOTE — Progress Notes (Signed)
Subjective:    Patient ID: Elizabeth Lin, female    DOB: 31-Jul-1946, 73 y.o.   MRN: [id number]  HPI Elizabeth Lin is a 73 y.o. female Presents today for: Chief Complaint  Patient presents with  . Medication Refill    synthroid and cymbalta   pcp is Sherren Mocha, MD  Here for med refills.  Last saw Dr. Clelia Croft in July 2019.   Hx of meniere's. Seen by Dr. Dorma Russell. Placed on diuretic and nausea med if needed for dizziness. Unknown names. Unsure if still needs to be on it.  Last saw ENT in 2019, plans to contact to discuss if needs to remain on this med. Takes diuretic QD - unknown dose.   Depression:  On cymbalta 30mg  per day. Tolerating with stable control.   Depression screen [facility] 2/9 11/19/2018 04/28/2018 12/12/2017 11/20/2017 10/31/2016  Decreased Interest 0 0 0 0 0  Down, Depressed, Hopeless 0 0 0 0 0  PHQ - 2 Score 0 0 0 0 0  Altered sleeping - - - - -  Tired, decreased energy - - - - -  Change in appetite - - - - -  Feeling bad or failure about yourself  - - - - -  Trouble concentrating - - - - -  Moving slowly or fidgety/restless - - - - -  Suicidal thoughts - - - - -  PHQ-9 Score - - - - -  Difficult doing work/chores - - - - -   Hyperlipidemia:  Lab Results  Component Value Date   CHOL 261 (H) 10/21/2017   HDL 83 10/21/2017   LDLCALC 152 (H) 10/21/2017   TRIG 131 10/21/2017   CHOLHDL 3.1 10/21/2017   Lab Results  Component Value Date   ALT 21 10/21/2017   AST 24 10/21/2017   ALKPHOS 77 10/21/2017   BILITOT 0.6 10/21/2017  not on meds. Planned diet and exercise - recently started exercising 3 times per week - about 3 weeks ago. Not fasting today.  Hypothyroidism: Lab Results  Component Value Date   TSH 2.470 10/21/2017  synthroid per day.  No new temp/skin/hair changes.  No new palpitations.   Has some bumps on wrist - there for awhile- last summer. No weakness/pain. no new changes. Plans to follow up to discuss further.  Patient Active  Problem List   Diagnosis Date Noted  . Meniere disease, left 12/12/2017  . Mild aortic stenosis 11/10/2017  . Bilateral edema of lower extremity 11/10/2017  . Family history of premature CAD 11/10/2017  . Arthralgia of multiple joints 10/23/2017  . Class 1 obesity due to excess calories without serious comorbidity with body mass index (BMI) of 32.0 to 32.9 in adult 10/23/2017  . Elevated blood pressure reading 10/23/2017  . New onset right bundle branch block (RBBB) 10/23/2017  . Pure hypercholesterolemia 10/23/2017  . Vitamin D deficiency 09/06/2014  . Right knee pain 02/24/2013  . Hypothyroid 11/02/2011  . Depression 11/02/2011  . 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 Allergy   . Anxiety   . Arthritis   . Cataract   . Depression   . Hammer toe   . Hypothyroidism due to Hashimoto's thyroiditis   . Thyroid disease    Past Surgical History:  Procedure Laterality Date  . APPENDECTOMY    . CYSTECTOMY     eye  . OOPHORECTOMY Right   . TONSILLECTOMY    . TRANSTHORACIC ECHOCARDIOGRAM  10/2016   No regional wall motion normality.  GR 1 DD.  Mild aortic stenosis (mean gradient 9 mmHg).  Mild LA dilation.  November 2018   Allergies  Allergen Reactions  . Penicillins Rash  . Amoxicillin-Pot Clavulanate     Red rash   Prior to Admission medications   Medication Sig Start Date End Date Taking? Authorizing Provider  Calcium-Vitamin D-Vitamin K (CALCIUM + D + K PO) Take 1,500 mg by mouth daily. 1500 mg+D1000 IU   Yes [provider]  DULoxetine (CYMBALTA) 30 MG capsule Take 1 capsule (30 mg total) by mouth daily. Needs office visit. 11/16/18  Yes Stallings, Zoe A, MD  fish oil-omega-3 fatty acids 1000 MG capsule Take 2 g by mouth daily.   Yes [provider]  GLUCOSAMINE CHONDROITIN COMPLX PO Take 2,000 Units by mouth daily.   Yes [provider]  levothyroxine (SYNTHROID, LEVOTHROID) 150 MCG tablet Take 1 tablet  (150 mcg total) by mouth daily. 10/23/17  Yes Sherren Mocha, MD   Social History   Socioeconomic History  . Marital status: Single    Spouse name: Not on file  . Number of children: 3  . Years of education: college  . Highest education level: Bachelor's degree (e.g., BA, AB, BS)  Occupational History  . 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 Financial resource strain: Not on file  . Food insecurity:    Worry: Not on file    Inability: Not on file  . Transportation needs:    Medical: Not on file    Non-medical: Not on file  Tobacco Use  . Smoking status: Former Smoker    Types: Cigarettes    Last attempt to quit: 11/01/1974    Years since quitting: 44.0  . Smokeless tobacco: Former Neurosurgeon    Quit date: 06/11/1982  Substance and Sexual Activity  . Alcohol use: No    Comment: occasional wine  . Drug use: No  . Sexual activity: Never  Lifestyle  . Physical activity:    Days per week: Not on file    Minutes per session: Not on file  . Stress: Not on file  Relationships  . Social connections:    Talks on phone: Not on file    Gets together: Not on file    Attends religious service: Not on file    Active member of club or organization: Not on file    Attends meetings of clubs or organizations: Not on file    Relationship status: Not on file  . Intimate partner violence:    Fear of current or ex partner: Not on file    Emotionally abused: Not on file    Physically abused: Not on file    Forced sexual activity: Not on file  Other Topics Concern  . Not on file  Social History Narrative   Patient is Divorced and lives with oldest daughter and grandson.  3 children, 3 grandchildren.   Patient's  Education: [employer].  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   Patient is right handed   Caffeine consumption 2-3 daily    Walks daily for roughly 30  minutes at a time.  She has not been doing it in the wintertime due to cold weather.       Review of Systems     Objective:   Physical Exam Vitals signs reviewed.  Constitutional:      Appearance: She is well-developed.  HENT:     Head: Normocephalic and atraumatic.  Eyes:     Conjunctiva/sclera: Conjunctivae normal.     Pupils: Pupils are equal, round, and reactive to light.  Neck:     Vascular: No carotid bruit.  Cardiovascular:     Rate and Rhythm: Normal rate and regular rhythm.     Heart sounds: Normal heart sounds.  Pulmonary:     Effort: Pulmonary effort is normal.     Breath sounds: Normal breath sounds.  Abdominal:     Palpations: Abdomen is soft. There is no pulsatile mass.     Tenderness: There is no abdominal tenderness.  Skin:    General: Skin is warm and dry.     Comments: Left volar wrist, few small cystic-appearing/mobile structures of the ulnar aspect.  Nontender.  Full range of motion.  Neurological:     Mental Status: She is alert and oriented to person, place, and time.  Psychiatric:        Behavior: Behavior normal.    Vitals:   11/19/18 1513 11/19/18 1617  BP: (!) 144/84 (!) 152/90  Pulse: 66   Temp: 98 F (36.7 C)   TempSrc: Oral   SpO2: 98%   Weight: 215 lb 12.8 oz (97.9 kg)   Height: 5\' 6"  (1.676 m)    BP Readings from Last 3 Encounters:  11/19/18 (!) 144/84  04/28/18 131/76  12/12/17 (!) 145/76   Results for orders placed or performed in visit on 11/19/18  TSH  Result Value Ref Range   TSH 2.000 0.450 - 4.500 uIU/mL  Basic metabolic panel  Result Value Ref Range   Glucose 87 65 - 99 mg/dL   BUN 22 8 - 27 mg/dL   Creatinine, Ser 1.610.85 0.57 - 1.00 mg/dL   GFR calc non Af Amer 68 >59 mL/min/1.73   GFR calc Af Amer 79 >59 mL/min/1.73   BUN/Creatinine Ratio 26 12 - 28   Sodium 137 134 - 144 mmol/L   Potassium 4.2 3.5 - 5.2 mmol/L   Chloride 97 96 - 106 mmol/L   CO2 24 20 - 29 mmol/L   Calcium 9.9 8.7 - 10.3 mg/dL         Assessment & Plan:   Phylliss BlakesCarol R Lapinski is a 73 y.o. female Hypothyroidism, unspecified type - Plan: TSH, levothyroxine (SYNTHROID, LEVOTHROID) 150 MCG tablet  -TSH normal range, stable symptoms, continue same dose Synthroid.  Pure hypercholesterolemia  -Commended on new exercise, not fasting.  We will hold off on blood work at this time with follow-up visit planned.   Medication monitoring encounter - Plan: Basic metabolic panel Elevated blood pressure reading  -BMP including renal function overall reassuring.  Borderline elevated blood pressure, suspect exercise will help.  Apparently she is taking a diuretic, unknown name or dose.  Plans to follow-up in the next 6 weeks without medication and can recheck blood pressure at that time.  Did note a few cystic structures on her volar left wrist, but full range of motion, nonpainful.  Possible ganglion cyst but will plan to discuss further at follow-up visit.  RTC precautions if acute worsening symptoms  Meds ordered this encounter  Medications  . DULoxetine (CYMBALTA) 30 MG capsule    Sig: Take 1 capsule (30 mg total) by mouth daily.    Dispense:  90 capsule    Refill:  1  . levothyroxine (SYNTHROID, LEVOTHROID) 150 MCG tablet    Sig:  Take 1 tablet (150 mcg total) by mouth daily.    Dispense:  90 tablet    Refill:  3   Patient Instructions   Thank you for coming in today.  No change in Synthroid or Cymbalta doses for now.  I will check some blood work including electrolytes but would recommend you discuss the diuretic with your ear nose and throat specialist.  Please bring a copy of those medications next time so we can update your list.  Keep up the good work with exercise and we can check cholesterol and blood pressure further at follow-up in 6 weeks.  Bumps on the wrist appear to be possible cysts and not concerning but I would like to look at those further as well.  Keep a record of your blood pressures outside of the office and  bring them to the next office visit.  Return to the clinic or go to the nearest emergency room if any of your symptoms worsen or new symptoms occur.    If you have lab work done today you will be contacted with your lab results within the next 2 weeks.  If you have not heard from [place] then please contact us. The fastest way to get your results is to register for My Chart.   IF you received an x-ray today, you will receive an invoice from [facility]. Please contact [facility] at [phone] with questions or concerns regarding your invoice.   IF you received labwork today, you will receive an invoice from [facility]. Please contact [facility] at [phone] with questions or concerns regarding your invoice.   Our billing staff will not be able to assist you with questions regarding bills from these companies.  You will be contacted with the lab results as soon as they are available. The fastest way to get your results is to activate your My Chart account. Instructions are located on the last page of this paperwork. If you have not heard from [place] regarding the results in 2 weeks, please contact this office.       Signed,   Meredith Staggers, MD [facility] at [facility].  11/23/18 4:02 PM

## 2018-11-20 LAB — BASIC METABOLIC PANEL
BUN/Creatinine Ratio: 26 (ref 12–28)
BUN: 22 mg/dL (ref 8–27)
CHLORIDE: 97 mmol/L (ref 96–106)
CO2: 24 mmol/L (ref 20–29)
Calcium: 9.9 mg/dL (ref 8.7–10.3)
Creatinine, Ser: 0.85 mg/dL (ref 0.57–1.00)
GFR calc Af Amer: 79 mL/min/{1.73_m2} (ref >59)
GFR calc non Af Amer: 68 mL/min/{1.73_m2} (ref >59)
GLUCOSE: 87 mg/dL (ref 65–99)
POTASSIUM: 4.2 mmol/L (ref 3.5–5.2)
SODIUM: 137 mmol/L (ref 134–144)

## 2018-11-20 LAB — TSH: TSH: 2 u[IU]/mL (ref 0.450–4.500)

## 2018-11-23 ENCOUNTER — Encounter: Payer: Self-pay | Admitting: Family Medicine

## 2018-12-02 ENCOUNTER — Ambulatory Visit: Payer: Medicare Other | Admitting: Family Medicine

## 2018-12-02 ENCOUNTER — Encounter: Payer: Self-pay | Admitting: Family Medicine

## 2018-12-02 ENCOUNTER — Other Ambulatory Visit: Payer: Self-pay

## 2018-12-02 VITALS — BP 132/76 | HR 67 | Temp 98.5°F | Ht 65.0 in | Wt 215.6 lb

## 2018-12-02 DIAGNOSIS — J22 Unspecified acute lower respiratory infection: Secondary | ICD-10-CM | POA: Diagnosis not present

## 2018-12-02 DIAGNOSIS — R0981 Nasal congestion: Secondary | ICD-10-CM | POA: Diagnosis not present

## 2018-12-02 MED ORDER — AZITHROMYCIN 250 MG PO TABS: ORAL_TABLET | ORAL | 0 refills | Status: DC

## 2018-12-02 NOTE — Progress Notes (Signed)
Subjective:    Patient ID: Elizabeth Lin, female    DOB: 08/10/1946, 73 y.o.   MRN: [id number]  HPI Elizabeth Lin is a 73 y.o. female Presents today for: Chief Complaint  Patient presents with  . throat is hot  . Headache  . Generalized Body Aches    4 days   Started 3 days ago with green phlegm - not cough -just bringing up phglem - min cough. Some PND.  body aches, headache.  Throat feels hot, but not sore. Throat feels better today. Frontal HA.  No tooth pain. No fever.   Tx: tylenol, guaifenesin, steroid NS. Drinking fluids. Has some codeine cough med - used last night.    Patient Active Problem List   Diagnosis Date Noted  . Meniere disease, left 12/12/2017  . Mild aortic stenosis 11/10/2017  . Bilateral edema of lower extremity 11/10/2017  . Family history of premature CAD 11/10/2017  . Arthralgia of multiple joints 10/23/2017  . Class 1 obesity due to excess calories without serious comorbidity with body mass index (BMI) of 32.0 to 32.9 in adult 10/23/2017  . Elevated blood pressure reading 10/23/2017  . New onset right bundle branch block (RBBB) 10/23/2017  . Pure hypercholesterolemia 10/23/2017  . Vitamin D deficiency 09/06/2014  . Right knee pain 02/24/2013  . Hypothyroid 11/02/2011  . Depression 11/02/2011  . 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 Allergy   . Anxiety   . Arthritis   . Cataract   . Depression   . Hammer toe   . Hypothyroidism due to Hashimoto's thyroiditis   . Thyroid disease    Past Surgical History:  Procedure Laterality Date  . APPENDECTOMY    . CYSTECTOMY     eye  . OOPHORECTOMY Right   . TONSILLECTOMY    . TRANSTHORACIC ECHOCARDIOGRAM  10/2016   No regional wall motion normality.  GR 1 DD.  Mild aortic stenosis (mean gradient 9 mmHg).  Mild LA dilation.  November 2018   Allergies  Allergen Reactions  . Penicillins Rash  . Amoxicillin-Pot Clavulanate     Red rash   Prior to  Admission medications   Medication Sig Start Date End Date Taking? Authorizing Provider  Calcium-Vitamin D-Vitamin K (CALCIUM + D + K PO) Take 1,500 mg by mouth daily. 1500 mg+D1000 IU   Yes [provider]  DULoxetine (CYMBALTA) 30 MG capsule Take 1 capsule (30 mg total) by mouth daily. 11/19/18  Yes Shade Flood, MD  fish oil-omega-3 fatty acids 1000 MG capsule Take 2 g by mouth daily.   Yes [provider]  GLUCOSAMINE CHONDROITIN COMPLX PO Take 2,000 Units by mouth daily.   Yes [provider]  levothyroxine (SYNTHROID, LEVOTHROID) 150 MCG tablet Take 1 tablet (150 mcg total) by mouth daily. 11/19/18  Yes Shade Flood, MD   Social History   Socioeconomic History  . Marital status: Single    Spouse name: Not on file  . Number of children: 3  . Years of education: college  . Highest education level: Bachelor's degree (e.g., BA, AB, BS)  Occupational History  . 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 Financial resource strain: Not on file  . Food insecurity:    Worry: Not on file    Inability: Not on file  . Transportation needs:    Medical: Not on file  Non-medical: Not on file  Tobacco Use  . Smoking status: Former Smoker    Types: Cigarettes    Last attempt to quit: 11/01/1974    Years since quitting: 44.1  . Smokeless tobacco: Former Neurosurgeon    Quit date: 06/11/1982  Substance and Sexual Activity  . Alcohol use: No    Comment: occasional wine  . Drug use: No  . Sexual activity: Never  Lifestyle  . Physical activity:    Days per week: Not on file    Minutes per session: Not on file  . Stress: Not on file  Relationships  . Social connections:    Talks on phone: Not on file    Gets together: Not on file    Attends religious service: Not on file    Active member of club or organization: Not on file    Attends meetings of clubs or organizations: Not on file    Relationship status:  Not on file  . Intimate partner violence:    Fear of current or ex partner: Not on file    Emotionally abused: Not on file    Physically abused: Not on file    Forced sexual activity: Not on file  Other Topics Concern  . Not on file  Social History Narrative   Patient is Divorced and lives with oldest daughter and grandson.  3 children, 3 grandchildren.   Patient's  Education: [employer].  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   Patient is right handed   Caffeine consumption 2-3 daily    Walks daily for roughly 30 minutes at a time.  She has not been doing it in the wintertime due to cold weather.       Review of Systems As per HPI.     Objective:   Physical Exam Vitals signs reviewed.  Constitutional:      General: She is not in acute distress.    Appearance: She is well-developed.  HENT:     Head: Normocephalic and atraumatic.     Right Ear: Hearing, tympanic membrane, ear canal and external ear normal.     Left Ear: Hearing, tympanic membrane, ear canal and external ear normal.     Nose: Congestion (Minimal dry congestion left versus right.  No active bleeding.) present.     Right Sinus: Maxillary sinus tenderness (Minimal) present. No frontal sinus tenderness.     Left Sinus: No maxillary sinus tenderness or frontal sinus tenderness.     Mouth/Throat:     Pharynx: No oropharyngeal exudate.  Eyes:     Conjunctiva/sclera: Conjunctivae normal.     Pupils: Pupils are equal, round, and reactive to light.  Cardiovascular:     Rate and Rhythm: Normal rate and regular rhythm.     Heart sounds: Normal heart sounds. No murmur.  Pulmonary:     Effort: Pulmonary effort is normal. No respiratory distress.     Breath sounds: Normal breath sounds. No wheezing or rhonchi.  Skin:    General: Skin is warm and dry.     Findings: No rash.  Neurological:     Mental Status: She is alert and oriented to  person, place, and time.  Psychiatric:        Behavior: Behavior normal.    Vitals:   12/02/18 1108  BP: 132/76  Pulse: 67  Temp: 98.5 F (36.9 C)  TempSrc: Oral  SpO2: 96%  Weight: 215 lb 9.6 oz (97.8  kg)  Height: 5\' 5"  (1.651 m)       Assessment & Plan:    Elizabeth Lin is a 73 y.o. female LRTI (lower respiratory tract infection) - Plan: azithromycin (ZITHROMAX) 250 MG tablet  Sinus congestion - Plan: azithromycin (ZITHROMAX) 250 MG tablet  Suspected viral illness at this time.  Green phlegm potentially postnasal drainage from sinuses.  Lungs clear on exam, reassuring vital signs.  -Symptomatic care initially discussed with saline nasal spray, guaifenesin, fluids, rest.  -Printed azithromycin given penicillin allergy if discolored discharge not improving within the next 4 to 5 days, or if more chest congestion/discolored phlegm with lower respiratory symptoms.  RTC precautions were given.  Additionally discussed potential risks/side effects of antibiotics with understanding expressed.  Meds ordered this encounter  Medications  . azithromycin (ZITHROMAX) 250 MG tablet    Sig: Take 2 pills by mouth on day 1, then 1 pill by mouth per day on days 2 through 5.    Dispense:  6 tablet    Refill:  0   Patient Instructions    Current symptoms are likely due to a virus.  Continue guaifenesin.  Drink plenty of fluids.  Saline nasal spray few times per day can help with congestion.  If sinus congestion/pressure and discolored nasal discharge is not improving in the next 4 to 5 days, or more chest congestion, can start antibiotic.  If any fevers, shortness of breath or worsening symptoms return for recheck.  Return to the clinic or go to the nearest emergency room if any of your symptoms worsen or new symptoms occur.   Upper Respiratory Infection, Adult An upper respiratory infection (URI) affects the nose, throat, and upper air passages. URIs are caused by germs (viruses). The  most common type of URI is often called "the common cold." Medicines cannot cure URIs, but you can do things at home to relieve your symptoms. URIs usually get better within 7-10 days. Follow these instructions at home: Activity  Rest as needed.  If you have a fever, stay home from work or school until your fever is gone, or until your doctor says you may return to work or school. ? You should stay home until you cannot spread the infection anymore (you are not contagious). ? Your doctor may have you wear a face mask so you have less risk of spreading the infection. Relieving symptoms  Gargle with a salt-water mixture 3-4 times a day or as needed. To make a salt-water mixture, completely dissolve -1 tsp of salt in 1 cup of warm water.  Use a cool-mist humidifier to add moisture to the air. This can help you breathe more easily. Eating and drinking   Drink enough fluid to keep your pee (urine) pale yellow.  Eat soups and other clear broths. General instructions   Take over-the-counter and prescription medicines only as told by your doctor. These include cold medicines, fever reducers, and cough suppressants.  Do not use any products that contain nicotine or tobacco. These include cigarettes and e-cigarettes. If you need help quitting, ask your doctor.  Avoid being where people are smoking (avoid secondhand smoke).  Make sure you get regular shots and get the flu shot every year.  Keep all follow-up visits as told by your doctor. This is important. How to avoid spreading infection to others   Wash your hands often with soap and water. If you do not have soap and water, use hand sanitizer.  Avoid touching your mouth, face, eyes,  or nose.  Cough or sneeze into a tissue or your sleeve or elbow. Do not cough or sneeze into your hand or into the air. Contact a doctor if:  You are getting worse, not better.  You have any of these: ? A fever. ? Chills. ? Brown or red mucus in  your nose. ? Yellow or brown fluid (discharge)coming from your nose. ? Pain in your face, especially when you bend forward. ? Swollen neck glands. ? Pain with swallowing. ? White areas in the back of your throat. Get help right away if:  You have shortness of breath that gets worse.  You have very bad or constant: ? Headache. ? Ear pain. ? Pain in your forehead, behind your eyes, and over your cheekbones (sinus pain). ? Chest pain.  You have long-lasting (chronic) lung disease along with any of these: ? Wheezing. ? Long-lasting cough. ? Coughing up blood. ? A change in your usual mucus.  You have a stiff neck.  You have changes in your: ? Vision. ? Hearing. ? Thinking. ? Mood. Summary  An upper respiratory infection (URI) is caused by a germ called a virus. The most common type of URI is often called "the common cold."  URIs usually get better within 7-10 days.  Take over-the-counter and prescription medicines only as told by your doctor. This information is not intended to replace advice given to you by your health care provider. Make sure you discuss any questions you have with your health care provider. Document Released: 03/03/2008 Document Revised: 05/08/2017 Document Reviewed: 05/08/2017 Elsevier Interactive Patient Education  2019 Elsevier Inc.  Cough, Adult  Coughing is a reflex that clears your throat and your airways. Coughing helps to heal and protect your lungs. It is normal to cough occasionally, but a cough that happens with other symptoms or lasts a long time may be a sign of a condition that needs treatment. A cough may last only 2-3 weeks (acute), or it may last longer than 8 weeks (chronic). What are the causes? Coughing is commonly caused by:  Breathing in substances that irritate your lungs.  A viral or bacterial respiratory infection.  Allergies.  Asthma.  Postnasal drip.  Smoking.  Acid backing up from the stomach into the esophagus  (gastroesophageal reflux).  Certain medicines.  Chronic lung problems, including COPD (or rarely, lung cancer).  Other medical conditions such as heart failure. Follow these instructions at home: Pay attention to any changes in your symptoms. Take these actions to help with your discomfort:  Take medicines only as told by your health care provider. ? If you were prescribed an antibiotic medicine, take it as told by your health care provider. Do not stop taking the antibiotic even if you start to feel better. ? Talk with your health care provider before you take a cough suppressant medicine.  Drink enough fluid to keep your urine clear or pale yellow.  If the air is dry, use a cold steam vaporizer or humidifier in your bedroom or your home to help loosen secretions.  Avoid anything that causes you to cough at work or at home.  If your cough is worse at night, try sleeping in a semi-upright position.  Avoid cigarette smoke. If you smoke, quit smoking. If you need help quitting, ask your health care provider.  Avoid caffeine.  Avoid alcohol.  Rest as needed. Contact a health care provider if:  You have new symptoms.  You cough up pus.  Your cough does  not get better after 2-3 weeks, or your cough gets worse.  You cannot control your cough with suppressant medicines and you are losing sleep.  You develop pain that is getting worse or pain that is not controlled with pain medicines.  You have a fever.  You have unexplained weight loss.  You have night sweats. Get help right away if:  You cough up blood.  You have difficulty breathing.  Your heartbeat is very fast. This information is not intended to replace advice given to you by your health care provider. Make sure you discuss any questions you have with your health care provider. Document Released: 03/14/2011 Document Revised: 02/21/2016 Document Reviewed: 11/22/2014 Elsevier Interactive Patient Education  [employer].   If you have lab work done today you will be contacted with your lab results within the next 2 weeks.  If you have not heard from [place] then please contact us. The fastest way to get your results is to register for My Chart.   IF you received an x-ray today, you will receive an invoice from [facility]. Please contact [facility] at [phone] with questions or concerns regarding your invoice.   IF you received labwork today, you will receive an invoice from [place]. Please contact [facility] at [phone] with questions or concerns regarding your invoice.   Our billing staff will not be able to assist you with questions regarding bills from these companies.  You will be contacted with the lab results as soon as they are available. The fastest way to get your results is to activate your My Chart account. Instructions are located on the last page of this paperwork. If you have not heard from [place] regarding the results in 2 weeks, please contact this office.       Signed,   Meredith Staggers, MD [facility] at [facility].  12/02/18 12:06 PM

## 2018-12-02 NOTE — Patient Instructions (Addendum)
Current symptoms are likely due to a virus.  Continue guaifenesin.  Drink plenty of fluids.  Saline nasal spray few times per day can help with congestion.  If sinus congestion/pressure and discolored nasal discharge is not improving in the next 4 to 5 days, or more chest congestion, can start antibiotic.  If any fevers, shortness of breath or worsening symptoms return for recheck.  Return to the clinic or go to the nearest emergency room if any of your symptoms worsen or new symptoms occur.   Upper Respiratory Infection, Adult An upper respiratory infection (URI) affects the nose, throat, and upper air passages. URIs are caused by germs (viruses). The most common type of URI is often called "the common cold." Medicines cannot cure URIs, but you can do things at home to relieve your symptoms. URIs usually get better within 7-10 days. Follow these instructions at home: Activity  Rest as needed.  If you have a fever, stay home from work or school until your fever is gone, or until your doctor says you may return to work or school. ? You should stay home until you cannot spread the infection anymore (you are not contagious). ? Your doctor may have you wear a face mask so you have less risk of spreading the infection. Relieving symptoms  Gargle with a salt-water mixture 3-4 times a day or as needed. To make a salt-water mixture, completely dissolve -1 tsp of salt in 1 cup of warm water.  Use a cool-mist humidifier to add moisture to the air. This can help you breathe more easily. Eating and drinking   Drink enough fluid to keep your pee (urine) pale yellow.  Eat soups and other clear broths. General instructions   Take over-the-counter and prescription medicines only as told by your doctor. These include cold medicines, fever reducers, and cough suppressants.  Do not use any products that contain nicotine or tobacco. These include cigarettes and e-cigarettes. If you need help quitting,  ask your doctor.  Avoid being where people are smoking (avoid secondhand smoke).  Make sure you get regular shots and get the flu shot every year.  Keep all follow-up visits as told by your doctor. This is important. How to avoid spreading infection to others   Wash your hands often with soap and water. If you do not have soap and water, use hand sanitizer.  Avoid touching your mouth, face, eyes, or nose.  Cough or sneeze into a tissue or your sleeve or elbow. Do not cough or sneeze into your hand or into the air. Contact a doctor if:  You are getting worse, not better.  You have any of these: ? A fever. ? Chills. ? Brown or red mucus in your nose. ? Yellow or brown fluid (discharge)coming from your nose. ? Pain in your face, especially when you bend forward. ? Swollen neck glands. ? Pain with swallowing. ? White areas in the back of your throat. Get help right away if:  You have shortness of breath that gets worse.  You have very bad or constant: ? Headache. ? Ear pain. ? Pain in your forehead, behind your eyes, and over your cheekbones (sinus pain). ? Chest pain.  You have long-lasting (chronic) lung disease along with any of these: ? Wheezing. ? Long-lasting cough. ? Coughing up blood. ? A change in your usual mucus.  You have a stiff neck.  You have changes in your: ? Vision. ? Hearing. ? Thinking. ? Mood. Summary  An  upper respiratory infection (URI) is caused by a germ called a virus. The most common type of URI is often called "the common cold."  URIs usually get better within 7-10 days.  Take over-the-counter and prescription medicines only as told by your doctor. This information is not intended to replace advice given to you by your health care provider. Make sure you discuss any questions you have with your health care provider. Document Released: 03/03/2008 Document Revised: 05/08/2017 Document Reviewed: 05/08/2017 Elsevier Interactive Patient  Education  2019 Elsevier Inc.  Cough, Adult  Coughing is a reflex that clears your throat and your airways. Coughing helps to heal and protect your lungs. It is normal to cough occasionally, but a cough that happens with other symptoms or lasts a long time may be a sign of a condition that needs treatment. A cough may last only 2-3 weeks (acute), or it may last longer than 8 weeks (chronic). What are the causes? Coughing is commonly caused by:  Breathing in substances that irritate your lungs.  A viral or bacterial respiratory infection.  Allergies.  Asthma.  Postnasal drip.  Smoking.  Acid backing up from the stomach into the esophagus (gastroesophageal reflux).  Certain medicines.  Chronic lung problems, including COPD (or rarely, lung cancer).  Other medical conditions such as heart failure. Follow these instructions at home: Pay attention to any changes in your symptoms. Take these actions to help with your discomfort:  Take medicines only as told by your health care provider. ? If you were prescribed an antibiotic medicine, take it as told by your health care provider. Do not stop taking the antibiotic even if you start to feel better. ? Talk with your health care provider before you take a cough suppressant medicine.  Drink enough fluid to keep your urine clear or pale yellow.  If the air is dry, use a cold steam vaporizer or humidifier in your bedroom or your home to help loosen secretions.  Avoid anything that causes you to cough at work or at home.  If your cough is worse at night, try sleeping in a semi-upright position.  Avoid cigarette smoke. If you smoke, quit smoking. If you need help quitting, ask your health care provider.  Avoid caffeine.  Avoid alcohol.  Rest as needed. Contact a health care provider if:  You have new symptoms.  You cough up pus.  Your cough does not get better after 2-3 weeks, or your cough gets worse.  You cannot control  your cough with suppressant medicines and you are losing sleep.  You develop pain that is getting worse or pain that is not controlled with pain medicines.  You have a fever.  You have unexplained weight loss.  You have night sweats. Get help right away if:  You cough up blood.  You have difficulty breathing.  Your heartbeat is very fast. This information is not intended to replace advice given to you by your health care provider. Make sure you discuss any questions you have with your health care provider. Document Released: 03/14/2011 Document Revised: 02/21/2016 Document Reviewed: 11/22/2014 Elsevier Interactive Patient Education  [employer].   If you have lab work done today you will be contacted with your lab results within the next 2 weeks.  If you have not heard from [place] then please contact us. The fastest way to get your results is to register for My Chart.   IF you received an x-ray today, you will receive an invoice from  [facility]. Please contact [facility] at [phone] with questions or concerns regarding your invoice.   IF you received labwork today, you will receive an invoice from [place]. Please contact [facility] at [phone] with questions or concerns regarding your invoice.   Our billing staff will not be able to assist you with questions regarding bills from these companies.  You will be contacted with the lab results as soon as they are available. The fastest way to get your results is to activate your My Chart account. Instructions are located on the last page of this paperwork. If you have not heard from [place] regarding the results in 2 weeks, please contact this office.

## 2018-12-29 ENCOUNTER — Telehealth (INDEPENDENT_AMBULATORY_CARE_PROVIDER_SITE_OTHER): Payer: Medicare Other | Admitting: Family Medicine

## 2018-12-29 ENCOUNTER — Other Ambulatory Visit: Payer: Self-pay

## 2018-12-29 DIAGNOSIS — R030 Elevated blood-pressure reading, without diagnosis of hypertension: Secondary | ICD-10-CM | POA: Diagnosis not present

## 2018-12-29 DIAGNOSIS — E039 Hypothyroidism, unspecified: Secondary | ICD-10-CM | POA: Diagnosis not present

## 2018-12-29 DIAGNOSIS — L989 Disorder of the skin and subcutaneous tissue, unspecified: Secondary | ICD-10-CM

## 2018-12-29 DIAGNOSIS — F329 Major depressive disorder, single episode, unspecified: Secondary | ICD-10-CM

## 2018-12-29 DIAGNOSIS — F32A Depression, unspecified: Secondary | ICD-10-CM

## 2018-12-29 DIAGNOSIS — E7800 Pure hypercholesterolemia, unspecified: Secondary | ICD-10-CM | POA: Diagnosis not present

## 2018-12-29 DIAGNOSIS — R635 Abnormal weight gain: Secondary | ICD-10-CM

## 2018-12-29 MED ORDER — TRIAMTERENE-HCTZ 37.5-25 MG PO TABS: 1.0000 | ORAL_TABLET | Freq: Every day | ORAL | 1 refills | Status: DC

## 2018-12-29 MED ORDER — DULOXETINE HCL 30 MG PO CPEP: 30.0000 mg | ORAL_CAPSULE | Freq: Every day | ORAL | 1 refills | Status: DC

## 2018-12-29 NOTE — Patient Instructions (Addendum)
Keep a record of your blood pressures outside of the office and if running over 140/90, let me know.   Make sure to drink of plenty of fluids to lessen risk of dehydration or dizziness.  Last thyroid test was normal but if weight gain continues, please follow up to discuss further.  Walking or other low intensity exercise most days per week - 150 minutes as a goal.  Recheck in 3 months for cholesterol test and possible repeat thyroid test at that time.   If bump under the ear is resolving, then continue to monitor. If not continuing to improve or worsens, you may need an in person visit.    How to Take Your Blood Pressure You can take your blood pressure at home with a machine. You may need to check your blood pressure at home:  To check if you have high blood pressure (hypertension).  To check your blood pressure over time.  To make sure your blood pressure medicine is working. Supplies needed: You will need a blood pressure machine, or monitor. You can buy one at a drugstore or online. When choosing one:  Choose one with an arm cuff.  Choose one that wraps around your upper arm. Only one finger should fit between your arm and the cuff.  Do not choose one that measures your blood pressure from your wrist or finger. Your doctor can suggest a monitor. How to prepare Avoid these things for 30 minutes before checking your blood pressure:  Drinking caffeine.  Drinking alcohol.  Eating.  Smoking.  Exercising. Five minutes before checking your blood pressure:  Pee.  Sit in a dining chair. Avoid sitting in a soft couch or armchair.  Be quiet. Do not talk. How to take your blood pressure Follow the instructions that came with your machine. If you have a digital blood pressure monitor, these may be the instructions: 1. Sit up straight. 2. Place your feet on the floor. Do not cross your ankles or legs. 3. Rest your left arm at the level of your heart. You may rest it on a  table, desk, or chair. 4. Pull up your shirt sleeve. 5. Wrap the blood pressure cuff around the upper part of your left arm. The cuff should be 1 inch (2.5 cm) above your elbow. It is best to wrap the cuff around bare skin. 6. Fit the cuff snugly around your arm. You should be able to place only one finger between the cuff and your arm. 7. Put the cord inside the groove of your elbow. 8. Press the power button. 9. Sit quietly while the cuff fills with air and loses air. 10. Write down the numbers on the screen. 11. Wait 2-3 minutes and then repeat steps 1-10. What do the numbers mean? Two numbers make up your blood pressure. The first number is called systolic pressure. The second is called diastolic pressure. An example of a blood pressure reading is "120 over 80" (or 120/80). If you are an adult and do not have a medical condition, use this guide to find out if your blood pressure is normal: Normal  First number: below 120.  Second number: below 80. Elevated  First number: 120-129.  Second number: below 80. Hypertension stage 1  First number: 130-139.  Second number: 80-89. Hypertension stage 2  First number: 140 or above.  Second number: 90 or above. Your blood pressure is above normal even if only the top or bottom number is above normal. Follow these  instructions at home:  Check your blood pressure as often as your doctor tells you to.  Take your monitor to your next doctor's appointment. Your doctor will: ? Make sure you are using it correctly. ? Make sure it is working right.  Make sure you understand what your blood pressure numbers should be.  Tell your doctor if your medicines are causing side effects. Contact a doctor if:  Your blood pressure keeps being high. Get help right away if:  Your first blood pressure number is higher than 180.  Your second blood pressure number is higher than 120. This information is not intended to replace advice given to you  by your health care provider. Make sure you discuss any questions you have with your health care provider. Document Released: 08/28/2008 Document Revised: 08/13/2016 Document Reviewed: 02/22/2016 Elsevier Interactive Patient Education  [employer].    If you have lab work done today you will be contacted with your lab results within the next 2 weeks.  If you have not heard from [place] then please contact us. The fastest way to get your results is to register for My Chart.   IF you received an x-ray today, you will receive an invoice from [facility]. Please contact [facility] at [phone] with questions or concerns regarding your invoice.   IF you received labwork today, you will receive an invoice from [place]. Please contact [facility] at [phone] with questions or concerns regarding your invoice.   Our billing staff will not be able to assist you with questions regarding bills from these companies.  You will be contacted with the lab results as soon as they are available. The fastest way to get your results is to activate your My Chart account. Instructions are located on the last page of this paperwork. If you have not heard from [place] regarding the results in 2 weeks, please contact this office.

## 2018-12-29 NOTE — Progress Notes (Signed)
Virtual Visit via Telephone Note  I connected with Elizabeth Lin on 12/29/18 at 8:23 AM by telephone and verified that I am speaking with the correct person using two identifiers.   I discussed the limitations, risks, security and privacy concerns of performing an evaluation and management service by telephone and the availability of in person appointments. I also discussed with the patient that there may be a patient responsible charge related to this service. The patient expressed understanding and agreed to proceed, consent obtained  Chief complaint: med refill, htn. Hld, thyroid, duloxetine.    History of Present Illness:  Hypertension: BP Readings from Last 3 Encounters:  12/02/18 132/76  11/19/18 (!) 150/88  04/28/18 131/76   Lab Results  Component Value Date   CREATININE 0.85 11/19/2018  maxzide 37.5/25mg  qd, history of mild aortic stenosis, right bundle branch block, seen by cardiology March 2019.  Plan for repeat echo in 2 years.  Coronary calcium score of 0 No new chest pain, dyspnea, rare lightheadedness - notes with walking at times, once per month. No syncope.  Home BP today: 146/86.   Hyperlipidemia:  Lab Results  Component Value Date   CHOL 261 (H) 10/21/2017   HDL 83 10/21/2017   LDLCALC 152 (H) 10/21/2017   TRIG 131 10/21/2017   CHOLHDL 3.1 10/21/2017   Lab Results  Component Value Date   ALT 21 10/21/2017   AST 24 10/21/2017   ALKPHOS 77 10/21/2017   BILITOT 0.6 10/21/2017  not on chol med.   Plan on diet approach initially.  Was discussed with cardiology last March with potential need for Zetia, or statin.  Plan for repeat testing in next few months as COVID-19 pandemic settles down. The 10-year ASCVD risk score Denman George DC Montez Hageman., et al., 2013) is: 18.5%   Values used to calculate the score:     Age: 73 years     Sex: Female     Is Non-Hispanic African American: No     Diabetic: No     Tobacco smoker: No     Systolic Blood Pressure: 132 mmHg  Is BP treated: Yes     HDL Cholesterol: 83 mg/dL     Total Cholesterol: 261 mg/dL  Hypothyroidism: Lab Results  Component Value Date   TSH 2.000 11/19/2018  synthroid . Rare missed dose. No new hair/skin changes.  17 pound weight gain past year. No regular exercise recently.  Recent TSH stable as above.  Depression:  Depression screen [facility] 2/9 12/29/2018 12/02/2018 11/19/2018 04/28/2018 12/12/2017  Decreased Interest 0 0 0 0 0  Down, Depressed, Hopeless 0 0 0 0 0  PHQ - 2 Score 0 0 0 0 0  Altered sleeping - - - - -  Tired, decreased energy - - - - -  Change in appetite - - - - -  Feeling bad or failure about yourself  - - - - -  Trouble concentrating - - - - -  Moving slowly or fidgety/restless - - - - -  Suicidal thoughts - - - - -  PHQ-9 Score - - - - -  Difficult doing work/chores - - - - -   On cymbalta  qd, feels likes working well.   Prior infection improved without need for abx. Feeling better now. Seen 12/02/18.   Has a bump below - left side below ear, noticed about 4 days ago. Initially firm and sore, now less firm and not as sore. No  Skin findings. Improving. No fever.  No ear pain, no change in haring. Small bump distally had pus that was expressed. Pin head size. Got red, but took cetirizine and it resolved.     Patient Active Problem List   Diagnosis Date Noted  . Meniere disease, left 12/12/2017  . Mild aortic stenosis 11/10/2017  . Bilateral edema of lower extremity 11/10/2017  . Family history of premature CAD 11/10/2017  . Arthralgia of multiple joints 10/23/2017  . Class 1 obesity due to excess calories without serious comorbidity with body mass index (BMI) of 32.0 to 32.9 in adult 10/23/2017  . Elevated blood pressure reading 10/23/2017  . New onset right bundle branch block (RBBB) 10/23/2017  . Pure hypercholesterolemia 10/23/2017  . Vitamin D deficiency 09/06/2014  . Right knee pain 02/24/2013  . Hypothyroid 11/02/2011  . Depression 11/02/2011  .  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 Allergy   . Anxiety   . Arthritis   . Cataract   . Depression   . Hammer toe   . Hypothyroidism due to Hashimoto's thyroiditis   . Thyroid disease    Past Surgical History:  Procedure Laterality Date  . APPENDECTOMY    . CYSTECTOMY     eye  . OOPHORECTOMY Right   . TONSILLECTOMY    . TRANSTHORACIC ECHOCARDIOGRAM  10/2016   No regional wall motion normality.  GR 1 DD.  Mild aortic stenosis (mean gradient 9 mmHg).  Mild LA dilation.  November 2018   Allergies  Allergen Reactions  . Penicillins Rash  . Amoxicillin-Pot Clavulanate     Red rash   Prior to Admission medications   Medication Sig Start Date End Date Taking? Authorizing Provider  Calcium-Vitamin D-Vitamin K (CALCIUM + D + K PO) Take 1,500 mg by mouth daily. 1500 mg+D1000 IU   Yes [provider]  DULoxetine (CYMBALTA) 30 MG capsule Take 1 capsule (30 mg total) by mouth daily. 11/19/18  Yes Shade Flood, MD  fish oil-omega-3 fatty acids 1000 MG capsule Take 2 g by mouth daily.   Yes [provider]  GLUCOSAMINE CHONDROITIN COMPLX PO Take 2,000 Units by mouth daily.   Yes [provider]  levothyroxine (SYNTHROID, LEVOTHROID) 150 MCG tablet Take 1 tablet (150 mcg total) by mouth daily. 11/19/18  Yes Shade Flood, MD  triamterene-hydrochlorothiazide (MAXZIDE-25) 37.5-25 MG tablet Take 1 tablet by mouth daily.   Yes [provider]   Social History   Socioeconomic History  . Marital status: Single    Spouse name: Not on file  . Number of children: 3  . Years of education: college  . Highest education level: Bachelor's degree (e.g., BA, AB, BS)  Occupational History  . 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 Financial resource strain: Not on file  . Food insecurity:    Worry: Not on file    Inability: Not on file  . Transportation  needs:    Medical: Not on file    Non-medical: Not on file  Tobacco Use  . Smoking status: Former Smoker    Types: Cigarettes    Last attempt to quit: 11/01/1974    Years since quitting: 44.1  . Smokeless tobacco: Former Neurosurgeon    Quit date: 06/11/1982  Substance and Sexual Activity  . Alcohol use: No    Comment: occasional wine  . Drug use: No  . Sexual activity: Never  Lifestyle  .  Physical activity:    Days per week: Not on file    Minutes per session: Not on file  . Stress: Not on file  Relationships  . Social connections:    Talks on phone: Not on file    Gets together: Not on file    Attends religious service: Not on file    Active member of club or organization: Not on file    Attends meetings of clubs or organizations: Not on file    Relationship status: Not on file  . Intimate partner violence:    Fear of current or ex partner: Not on file    Emotionally abused: Not on file    Physically abused: Not on file    Forced sexual activity: Not on file  Other Topics Concern  . Not on file  Social History Narrative   Patient is Divorced and lives with oldest daughter and grandson.  3 children, 3 grandchildren.   Patient's  Education: [employer].  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   Patient is right handed   Caffeine consumption 2-3 daily    Walks daily for roughly 30 minutes at a time.  She has not been doing it in the wintertime due to cold weather.        Observations/Objective: Normal speech, no distress on the phone.  Appropriate responses  Assessment and Plan: Depression, unspecified depression type - Plan: DULoxetine (CYMBALTA) 30 MG capsule  -Stable, continue same dose Cymbalta  Pure hypercholesterolemia  -Elevated ASCVD risk.  As above she would like to defer testing or potential meds until after current pandemic has settled down.  Elevated blood pressure reading - Plan:  triamterene-hydrochlorothiazide (MAXZIDE-25) 37.5-25 MG tablet  -Borderline elevated on 100 reading, but in office reading okay March 5.  Continue same dose Maxide.  Cautioned if increased episodes of dizziness may need further eval, med changes or other testing.  RTC precautions given.  Also discussed maintaining good hydration  Hypothyroidism, unspecified type Weight gain  -Recent testing stable.  No intensity exercise most days per week but RTC precautions given if persistent weight gain, or difficulty with loss.   Face lesion  -Reports lesion under her ear but now improving.  If not continuing to improve or any worsening symptoms, recommend repeat visit, potentially an office visit may be needed.  Understanding expressed.     Follow Up Instructions: 3 months, sooner if worse  Patient Instructions   Keep a record of your blood pressures outside of the office and if running over 140/90, let me know.   Make sure to drink of plenty of fluids to lessen risk of dehydration or dizziness.  Last thyroid test was normal but if weight gain continues, please follow up to discuss further.  Walking or other low intensity exercise most days per week - 150 minutes as a goal.  Recheck in 3 months for cholesterol test and possible repeat thyroid test at that time.   If bump under the ear is resolving, then continue to monitor. If not continuing to improve or worsens, you may need an in person visit.    How to Take Your Blood Pressure You can take your blood pressure at home with a machine. You may need to check your blood pressure at home:  To check if you have high blood pressure (hypertension).  To check your blood pressure over time.  To make sure your blood pressure medicine is working.  Supplies needed: You will need a blood pressure machine, or monitor. You can buy one at a drugstore or online. When choosing one:  Choose one with an arm cuff.  Choose one that wraps around your upper  arm. Only one finger should fit between your arm and the cuff.  Do not choose one that measures your blood pressure from your wrist or finger. Your doctor can suggest a monitor. How to prepare Avoid these things for 30 minutes before checking your blood pressure:  Drinking caffeine.  Drinking alcohol.  Eating.  Smoking.  Exercising. Five minutes before checking your blood pressure:  Pee.  Sit in a dining chair. Avoid sitting in a soft couch or armchair.  Be quiet. Do not talk. How to take your blood pressure Follow the instructions that came with your machine. If you have a digital blood pressure monitor, these may be the instructions: 1. Sit up straight. 2. Place your feet on the floor. Do not cross your ankles or legs. 3. Rest your left arm at the level of your heart. You may rest it on a table, desk, or chair. 4. Pull up your shirt sleeve. 5. Wrap the blood pressure cuff around the upper part of your left arm. The cuff should be 1 inch (2.5 cm) above your elbow. It is best to wrap the cuff around bare skin. 6. Fit the cuff snugly around your arm. You should be able to place only one finger between the cuff and your arm. 7. Put the cord inside the groove of your elbow. 8. Press the power button. 9. Sit quietly while the cuff fills with air and loses air. 10. Write down the numbers on the screen. 11. Wait 2-3 minutes and then repeat steps 1-10. What do the numbers mean? Two numbers make up your blood pressure. The first number is called systolic pressure. The second is called diastolic pressure. An example of a blood pressure reading is "120 over 80" (or 120/80). If you are an adult and do not have a medical condition, use this guide to find out if your blood pressure is normal: Normal  First number: below 120.  Second number: below 80. Elevated  First number: 120-129.  Second number: below 80. Hypertension stage 1  First number: 130-139.  Second number: 80-89.  Hypertension stage 2  First number: 140 or above.  Second number: 90 or above. Your blood pressure is above normal even if only the top or bottom number is above normal. Follow these instructions at home:  Check your blood pressure as often as your doctor tells you to.  Take your monitor to your next doctor's appointment. Your doctor will: ? Make sure you are using it correctly. ? Make sure it is working right.  Make sure you understand what your blood pressure numbers should be.  Tell your doctor if your medicines are causing side effects. Contact a doctor if:  Your blood pressure keeps being high. Get help right away if:  Your first blood pressure number is higher than 180.  Your second blood pressure number is higher than 120. This information is not intended to replace advice given to you by your health care provider. Make sure you discuss any questions you have with your health care provider. Document Released: 08/28/2008 Document Revised: 08/13/2016 Document Reviewed: 02/22/2016 Elsevier Interactive Patient Education  [employer].    If you have lab work done today you will be contacted with your lab results within the next 2  weeks.  If you have not heard from [place] then please contact us. The fastest way to get your results is to register for My Chart.   IF you received an x-ray today, you will receive an invoice from [facility]. Please contact [facility] at [phone] with questions or concerns regarding your invoice.   IF you received labwork today, you will receive an invoice from [place]. Please contact [facility] at [phone] with questions or concerns regarding your invoice.   Our billing staff will not be able to assist you with questions regarding bills from these companies.  You will be contacted with the lab results as soon as they are available. The fastest way to get your results is to activate your My Chart account. Instructions  are located on the last page of this paperwork. If you have not heard from [place] regarding the results in 2 weeks, please contact this office.          I discussed the assessment and treatment plan with the patient. The patient was provided an opportunity to ask questions and all were answered. The patient agreed with the plan and demonstrated an understanding of the instructions.   The patient was advised to call back or seek an in-person evaluation if the symptoms worsen or if the condition fails to improve as anticipated.  I provided 18 minutes of non-face-to-face time during this encounter.  Signed,   Meredith Staggers, MD [facility] at [facility].  12/29/18

## 2018-12-29 NOTE — Progress Notes (Signed)
CC- recheck Bp and labs- Patient need a refill on duloxetine medication and I also pend patient labs for you to sign off on and have her come in the office for blood work.

## 2019-04-27 DIAGNOSIS — H903 Sensorineural hearing loss, bilateral: Secondary | ICD-10-CM | POA: Insufficient documentation

## 2019-05-27 ENCOUNTER — Ambulatory Visit (INDEPENDENT_AMBULATORY_CARE_PROVIDER_SITE_OTHER): Payer: Medicare Other | Admitting: Family Medicine

## 2019-05-27 ENCOUNTER — Other Ambulatory Visit: Payer: Self-pay

## 2019-05-27 ENCOUNTER — Encounter: Payer: Self-pay | Admitting: Family Medicine

## 2019-05-27 VITALS — BP 128/80 | HR 66 | Temp 98.9°F | Resp 16 | Ht 65.0 in | Wt 215.8 lb

## 2019-05-27 DIAGNOSIS — R1031 Right lower quadrant pain: Secondary | ICD-10-CM

## 2019-05-27 DIAGNOSIS — R109 Unspecified abdominal pain: Secondary | ICD-10-CM

## 2019-05-27 DIAGNOSIS — Z23 Encounter for immunization: Secondary | ICD-10-CM | POA: Diagnosis not present

## 2019-05-27 DIAGNOSIS — Z1239 Encounter for other screening for malignant neoplasm of breast: Secondary | ICD-10-CM

## 2019-05-27 DIAGNOSIS — R195 Other fecal abnormalities: Secondary | ICD-10-CM

## 2019-05-27 NOTE — Patient Instructions (Addendum)
I will order an ultrasound to evaluate for possible hernia on the right lower quadrant.  See information below on precautions if this is a hernia.  Continue fiber, fluids in the diet to minimize risk of constipation or straining.  Please let me know your previous gastroenterologist and I can refer you there to discuss the change in stool color.  Follow-up in the next few weeks and we can review the results of your scan.  If there are other concerns that we were unable to address today, can discuss those at that time or sooner if you feel appointment is needed sooner.  Thank you for coming in today.  Return to the clinic or go to the nearest emergency room if any of your symptoms worsen or new symptoms occur.   Hernia, Adult     A hernia is the bulging of an organ or tissue through a weak spot in the muscles of the abdomen (abdominal wall). Hernias develop most often near the belly button (navel) or the area where the leg meets the lower abdomen (groin). Common types of hernias include:  Incisional hernia. This type bulges through a scar from an abdominal surgery.  Umbilical hernia. This type develops near the navel.  Inguinal hernia. This type develops in the groin or scrotum.  Femoral hernia. This type develops under the groin, in the upper thigh area.  Hiatal hernia. This type occurs when part of the stomach slides above the muscle that separates the abdomen from the chest (diaphragm). What are the causes? This condition may be caused by:  Heavy lifting.  Coughing over a long period of time.  Straining to have a bowel movement. Constipation can lead to straining.  An incision made during an abdominal surgery.  A physical problem that is present at birth (congenital defect).  Being overweight or obese.  Smoking.  Excess fluid in the abdomen.  Undescended testicles in males. What are the signs or symptoms? The main symptom is a skin-colored, rounded bulge in the area of  the hernia. However, a bulge may not always be present. It may grow bigger or be more visible when you cough or strain (such as when lifting something heavy). A hernia that can be pushed back into the area (is reducible) rarely causes pain. A hernia that cannot be pushed back into the area (is incarcerated) may lose its blood supply (become strangulated). A hernia that is incarcerated may cause:  Pain.  Fever.  Nausea and vomiting.  Swelling.  Constipation. How is this diagnosed? A hernia may be diagnosed based on:  Your symptoms and medical history.  A physical exam. Your health care provider may ask you to cough or move in certain ways to see if the hernia becomes visible.  Imaging tests, such as: ? X-rays. ? Ultrasound. ? CT scan. How is this treated? A hernia that is small and painless may not need to be treated. A hernia that is large or painful may be treated with surgery. Inguinal hernias may be treated with surgery to prevent incarceration or strangulation. Strangulated hernias are always treated with surgery because a lack of blood supply to the trapped organ or tissue can cause it to die. Surgery to treat a hernia involves pushing the bulge back into place and repairing the weak area of the muscle or abdominal wall. Follow these instructions at home: Activity  Avoid straining.  Do not lift anything that is heavier than 10 lb (4.5 kg), or the limit that you  are told, until your health care provider says that it is safe.  When lifting heavy objects, lift with your leg muscles, not your back muscles. Preventing constipation  Take actions to prevent constipation. Constipation leads to straining with bowel movements, which can make a hernia worse or cause a hernia repair to break down. Your health care provider may recommend that you: ? Drink enough fluid to keep your urine pale yellow. ? Eat foods that are high in fiber, such as fresh fruits and vegetables, whole grains,  and beans. ? Limit foods that are high in fat and processed sugars, such as fried or sweet foods. ? Take an over-the-counter or prescription medicine for constipation. General instructions  When coughing, try to cough gently.  You may try to push the hernia back in place by very gently pressing on it while lying down. Do not try to force the bulge back in if it will not push in easily.  If you are overweight, work with your health care provider to lose weight safely.  Do not use any products that contain nicotine or tobacco, such as cigarettes and e-cigarettes. If you need help quitting, ask your health care provider.  If you are scheduled for hernia repair, watch your hernia for any changes in shape, size, or color. Tell your health care provider about any changes or new symptoms.  Take over-the-counter and prescription medicines only as told by your health care provider.  Keep all follow-up visits as told by your health care provider. This is important. Contact a health care provider if:  You develop new pain, swelling, or redness around your hernia.  You have signs of constipation, such as: ? Fewer bowel movements in a week than normal. ? Difficulty having a bowel movement. ? Stools that are dry, hard, or larger than normal. Get help right away if:  You have a fever.  You have abdomen pain that gets worse.  You feel nauseous or you vomit.  You cannot push the hernia back in place by very gently pressing on it while lying down. Do not try to force the bulge back in if it will not push in easily.  The hernia: ? Changes in shape, size, or color. ? Feels hard or tender. These symptoms may represent a serious problem that is an emergency. Do not wait to see if the symptoms will go away. Get medical help right away. Call your local emergency services (911 in the [place] Summary  A hernia is the bulging of an organ or tissue through a weak spot in the muscles of the abdomen  (abdominal wall).  The main symptom is a skin-colored, rounded lump (bulge) in the hernia area. However, a bulge may not always be present. It may grow bigger or more visible when you cough or strain (such as when having a bowel movement).  A hernia that is small and painless may not need to be treated. A hernia that is large or painful may be treated with surgery.  Surgery to treat a hernia involves pushing the bulge back into place and repairing the weak part of the abdomen. This information is not intended to replace advice given to you by your health care provider. Make sure you discuss any questions you have with your health care provider. Document Released: 09/15/2005 Document Revised: 01/06/2019 Document Reviewed: 06/17/2017 Elsevier Patient Education  [employer].    If you have lab work done today you will be contacted with your lab results within  the next 2 weeks.  If you have not heard from [place] then please contact us. The fastest way to get your results is to register for My Chart.   IF you received an x-ray today, you will receive an invoice from [facility]. Please contact [facility] at [phone] with questions or concerns regarding your invoice.   IF you received labwork today, you will receive an invoice from [place]. Please contact [facility] at [phone] with questions or concerns regarding your invoice.   Our billing staff will not be able to assist you with questions regarding bills from these companies.  You will be contacted with the lab results as soon as they are available. The fastest way to get your results is to activate your My Chart account. Instructions are located on the last page of this paperwork. If you have not heard from [place] regarding the results in 2 weeks, please contact this office.

## 2019-05-27 NOTE — Progress Notes (Signed)
Subjective:    Patient ID: Elizabeth Lin, female    DOB: [birth year], 73 y.o.   MRN: [id number]  HPI Elizabeth Lin is a 73 y.o. female Presents today for: Chief Complaint  Patient presents with   Abdominal Pain    weird bowel movement -feces is not the normal brown color per pt.  rlq discomfort but not pain x couple months, ? hernia and enlargement in the area is not as large it has decreased.  Per pt a moht since feeling the enlargement on the right side.  BM's are softer x 4-5 months and regular.  Per pt increase veggies and less meat, not as active since quarantine.  Some weight gain also.  Pt concerned and will like the enlargement to be checked out for peace of mind   Abdominal Pain: Fullness in RLQ of abdomen that increases and decreases in size in past few months.  More bloating and gas with starch foods. Fullness worse with more starchy foods.  fullnessSmaller in size with more vegetables.  More soft stools in past few months. Harder prior.  Color of stool past few months. No blood in stool or black stools.  Has had some weight gain past few months and increased waist circumference. Less activity with quarantine.  Slight discomfort in RLQ.  No n/v.  Appendectomy in 2008, oopherectomy in 2008 - cyst, benign, and partial colectomy or cyst inside colon? all during same surgery in 2008.  No prior hernia known.   Last colonoscopy in approx 2005 - thinks had more recently in [place].  . Lab Results  Component Value Date   CREATININE 0.85 11/19/2018    HM: Due for mammogram. Referral placed.   Patient Active Problem List   Diagnosis Date Noted   Meniere disease, left 12/12/2017   Mild aortic stenosis 11/10/2017   Bilateral edema of lower extremity 11/10/2017   Family history of premature CAD 11/10/2017   Arthralgia of multiple joints 10/23/2017   Class 1 obesity due to excess calories without serious comorbidity with body mass index (BMI) of 32.0 to 32.9  in adult 10/23/2017   Elevated blood pressure reading 10/23/2017   New onset right bundle branch block (RBBB) 10/23/2017   Pure hypercholesterolemia 10/23/2017   Vitamin D deficiency 09/06/2014   Right knee pain 02/24/2013   Hypothyroid 11/02/2011   Depression 11/02/2011   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  GR 1 DD.  Mild aortic stenosis (mean gradient 9 mmHg).  Mild LA dilation.  November 2018   Allergies  Allergen Reactions   Penicillins Rash   Amoxicillin-Pot Clavulanate     Red rash   Prior to Admission medications   Medication Sig Start Date End Date Taking? Authorizing Provider  Calcium-Vitamin D-Vitamin K (CALCIUM + D + K PO) Take 1,500 mg by mouth daily. 1500 mg+D1000 IU   Yes [provider]  DULoxetine (CYMBALTA) 30 MG capsule Take 1 capsule (30 mg total) by mouth daily. 12/29/18  Yes Shade FloodGreene, Alyze Lauf R, MD  fish oil-omega-3 fatty acids 1000 MG capsule Take 2 g by mouth daily.   Yes [provider]  GLUCOSAMINE CHONDROITIN COMPLX PO Take 2,000 Units by mouth daily.   Yes [provider]  levothyroxine (SYNTHROID, LEVOTHROID) 150 MCG tablet Take 1 tablet (150 mcg total) by mouth daily. 11/19/18  Yes Shade FloodGreene, Jaidynn Balster R, MD  triamterene-hydrochlorothiazide (MAXZIDE-25) 37.5-25 MG tablet Take 1 tablet by mouth daily. 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  Social Needs   Financial resource strain: Not on file   Food insecurity    Worry: Not on file    Inability: Not on file   Transportation needs    Medical: Not on file    Non-medical: Not on file  Tobacco Use   Smoking status: Former Smoker    Types: Cigarettes    Quit date: 11/01/1974    Years since quitting: 44.5   Smokeless tobacco: Former NeurosurgeonUser    Quit date: 06/11/1982  Substance and Sexual Activity   Alcohol use: No    Comment: occasional wine   Drug use: No   Sexual activity: Never  Lifestyle   Physical activity    Days per week: Not on file    Minutes per session: Not on file   Stress: Not on file  Relationships   Social connections    Talks on phone: Not on file    Gets together: Not on file    Attends religious service: Not on file    Active member of club or organization: Not on file    Attends meetings of clubs or organizations: Not on file    Relationship status: Not on file   Intimate partner violence    Fear of current or ex partner: Not on file    Emotionally abused: Not on file    Physically abused: Not on file    Forced sexual activity: Not on file  Other Topics Concern   Not on file  Social History Narrative   Patient is Divorced and lives with oldest daughter and grandson.  3 children, 3 grandchildren.   Patient's  Education: [employer].  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   Patient is right handed   Caffeine consumption 2-3 daily    Walks daily for roughly 30 minutes at a time.  She has not been doing it in the wintertime due to cold weather.       Review of Systems Per HPI.     Objective:   Physical Exam Constitutional:      General: She is not in acute distress.    Appearance: She is well-developed.  HENT:      Head: Normocephalic and atraumatic.  Cardiovascular:     Rate and Rhythm: Normal rate.  Pulmonary:     Effort: Pulmonary effort is normal.  Abdominal:     Tenderness: There is abdominal tenderness in the right lower quadrant. There is no right CVA tenderness, left CVA tenderness or guarding.     Hernia: A hernia (Approximately 6 to 7 cm fullness right  lower abdominal wall, minimal discomfort.  Increased size with sitting up) is present.  Skin:    General: Skin is warm and dry.     Findings: No rash.  Neurological:     Mental Status: She is alert and oriented to person, place, and time.    Vitals:   05/27/19 1623 05/27/19 1733  BP: (!) 141/78 128/80  Pulse: 66   Resp: 16   Temp: 98.9 F (37.2 C)   TempSrc: Oral   SpO2: 99%   Weight: 215 lb 12.8 oz (97.9 kg)   Height: 5\' 5"  (1.651 m)          Assessment & Plan:   Elizabeth Lin is a 73 y.o. female Right lower quadrant abdominal pain - Plan: US Pelvis Complete Abdominal pain, unspecified abdominal location  -Exam concerning for possible hernia.  Initially will check ultrasound, possible CT scanning if needed.  Hernia precautions discussed, avoid straining, bowel regimen discussed.  RTC/ER precautions given.  Screening for breast cancer - Plan: MM Digital Screening  Need for prophylactic vaccination and inoculation against influenza  Change in stool  -Plan for follow-up with gastroenterology.  She will advise me who saw her previously and can place referral to that provider.  No orders of the defined types were placed in this encounter.  Patient Instructions     I will order an ultrasound to evaluate for possible hernia on the right lower quadrant.  See information below on precautions if this is a hernia.  Continue fiber, fluids in the diet to minimize risk of constipation or straining.  Please let me know your previous gastroenterologist and I can refer you there to discuss the change in stool color.   Follow-up in the next few weeks and we can review the results of your scan.  If there are other concerns that we were unable to address today, can discuss those at that time or sooner if you feel appointment is needed sooner.  Thank you for coming in today.  Return to the clinic or go to the nearest emergency room if any of your symptoms worsen or new symptoms occur.   Hernia, Adult     A hernia is the bulging of an organ or tissue through a weak spot in the muscles of the abdomen (abdominal wall). Hernias develop most often near the belly button (navel) or the area where the leg meets the lower abdomen (groin). Common types of hernias include:  Incisional hernia. This type bulges through a scar from an abdominal surgery.  Umbilical hernia. This type develops near the navel.  Inguinal hernia. This type develops in the groin or scrotum.  Femoral hernia. This type develops under the groin, in the upper thigh area.  Hiatal hernia. This type occurs when part of the stomach slides above the muscle that separates the abdomen from the chest (diaphragm). What are the causes? This condition may be caused by:  Heavy lifting.  Coughing over a long period of time.  Straining to have a bowel movement. Constipation can lead to straining.  An incision made during an abdominal surgery.  A physical problem that is present at birth (congenital defect).  Being overweight or obese.  Smoking.  Excess fluid in the abdomen.  Undescended testicles in males. What are the signs or symptoms? The main symptom is a skin-colored, rounded bulge in the area of the hernia. However, a bulge may not always be present. It may grow bigger or be more visible when you  cough or strain (such as when lifting something heavy). A hernia that can be pushed back into the area (is reducible) rarely causes pain. A hernia that cannot be pushed back into the area (is incarcerated) may lose its blood supply (become  strangulated). A hernia that is incarcerated may cause:  Pain.  Fever.  Nausea and vomiting.  Swelling.  Constipation. How is this diagnosed? A hernia may be diagnosed based on:  Your symptoms and medical history.  A physical exam. Your health care provider may ask you to cough or move in certain ways to see if the hernia becomes visible.  Imaging tests, such as: ? X-rays. ? Ultrasound. ? CT scan. How is this treated? A hernia that is small and painless may not need to be treated. A hernia that is large or painful may be treated with surgery. Inguinal hernias may be treated with surgery to prevent incarceration or strangulation. Strangulated hernias are always treated with surgery because a lack of blood supply to the trapped organ or tissue can cause it to die. Surgery to treat a hernia involves pushing the bulge back into place and repairing the weak area of the muscle or abdominal wall. Follow these instructions at home: Activity  Avoid straining.  Do not lift anything that is heavier than 10 lb (4.5 kg), or the limit that you are told, until your health care provider says that it is safe.  When lifting heavy objects, lift with your leg muscles, not your back muscles. Preventing constipation  Take actions to prevent constipation. Constipation leads to straining with bowel movements, which can make a hernia worse or cause a hernia repair to break down. Your health care provider may recommend that you: ? Drink enough fluid to keep your urine pale yellow. ? Eat foods that are high in fiber, such as fresh fruits and vegetables, whole grains, and beans. ? Limit foods that are high in fat and processed sugars, such as fried or sweet foods. ? Take an over-the-counter or prescription medicine for constipation. General instructions  When coughing, try to cough gently.  You may try to push the hernia back in place by very gently pressing on it while lying down. Do not try to  force the bulge back in if it will not push in easily.  If you are overweight, work with your health care provider to lose weight safely.  Do not use any products that contain nicotine or tobacco, such as cigarettes and e-cigarettes. If you need help quitting, ask your health care provider.  If you are scheduled for hernia repair, watch your hernia for any changes in shape, size, or color. Tell your health care provider about any changes or new symptoms.  Take over-the-counter and prescription medicines only as told by your health care provider.  Keep all follow-up visits as told by your health care provider. This is important. Contact a health care provider if:  You develop new pain, swelling, or redness around your hernia.  You have signs of constipation, such as: ? Fewer bowel movements in a week than normal. ? Difficulty having a bowel movement. ? Stools that are dry, hard, or larger than normal. Get help right away if:  You have a fever.  You have abdomen pain that gets worse.  You feel nauseous or you vomit.  You cannot push the hernia back in place by very gently pressing on it while lying down. Do not try to force the bulge back in if it will  not push in easily.  The hernia: ? Changes in shape, size, or color. ? Feels hard or tender. These symptoms may represent a serious problem that is an emergency. Do not wait to see if the symptoms will go away. Get medical help right away. Call your local emergency services (911 in the [place] Summary  A hernia is the bulging of an organ or tissue through a weak spot in the muscles of the abdomen (abdominal wall).  The main symptom is a skin-colored, rounded lump (bulge) in the hernia area. However, a bulge may not always be present. It may grow bigger or more visible when you cough or strain (such as when having a bowel movement).  A hernia that is small and painless may not need to be treated. A hernia that is large or painful may  be treated with surgery.  Surgery to treat a hernia involves pushing the bulge back into place and repairing the weak part of the abdomen. This information is not intended to replace advice given to you by your health care provider. Make sure you discuss any questions you have with your health care provider. Document Released: 09/15/2005 Document Revised: 01/06/2019 Document Reviewed: 06/17/2017 Elsevier Patient Education  [employer].    If you have lab work done today you will be contacted with your lab results within the next 2 weeks.  If you have not heard from [place] then please contact us. The fastest way to get your results is to register for My Chart.   IF you received an x-ray today, you will receive an invoice from [facility]. Please contact [facility] at [phone] with questions or concerns regarding your invoice.   IF you received labwork today, you will receive an invoice from [place]. Please contact [facility] at [phone] with questions or concerns regarding your invoice.   Our billing staff will not be able to assist you with questions regarding bills from these companies.  You will be contacted with the lab results as soon as they are available. The fastest way to get your results is to activate your My Chart account. Instructions are located on the last page of this paperwork. If you have not heard from [place] regarding the results in 2 weeks, please contact this office.      Signed,   Meredith Staggers, MD [facility] at [facility].  05/28/19 3:05 PM

## 2019-05-28 ENCOUNTER — Encounter: Payer: Self-pay | Admitting: Family Medicine

## 2019-06-02 ENCOUNTER — Ambulatory Visit
Admission: RE | Admit: 2019-06-02 | Discharge: 2019-06-02 | Disposition: A | Discharge status: Home / Self Care | Payer: Medicare Other | Source: Ambulatory Visit | Attending: Family Medicine | Admitting: Family Medicine

## 2019-06-02 DIAGNOSIS — R1031 Right lower quadrant pain: Secondary | ICD-10-CM

## 2019-06-24 ENCOUNTER — Other Ambulatory Visit: Payer: Self-pay | Admitting: Family Medicine

## 2019-06-24 DIAGNOSIS — F329 Major depressive disorder, single episode, unspecified: Secondary | ICD-10-CM

## 2019-06-24 DIAGNOSIS — F32A Depression, unspecified: Secondary | ICD-10-CM

## 2019-06-24 DIAGNOSIS — R030 Elevated blood-pressure reading, without diagnosis of hypertension: Secondary | ICD-10-CM

## 2019-07-19 ENCOUNTER — Telehealth: Payer: Self-pay | Admitting: *Deleted

## 2019-07-19 NOTE — Telephone Encounter (Signed)
No answer  Schedule AWV 

## 2019-08-29 ENCOUNTER — Ambulatory Visit (INDEPENDENT_AMBULATORY_CARE_PROVIDER_SITE_OTHER): Payer: Medicare Other | Admitting: Family Medicine

## 2019-08-29 VITALS — BP 132/76 | Ht 65.75 in | Wt 215.0 lb

## 2019-08-29 DIAGNOSIS — Z0000 Encounter for general adult medical examination without abnormal findings: Secondary | ICD-10-CM | POA: Diagnosis not present

## 2019-08-29 NOTE — Patient Instructions (Addendum)
Thank you for taking time to come for your Medicare Wellness Visit. I appreciate your ongoing commitment to your health goals. Please review the following plan we discussed and let me know if I can assist you in the future.  Ilisha Blust LPN  Preventive Care 74 Years and Older, Female Preventive care refers to lifestyle choices and visits with your health care provider that can promote health and wellness. This includes:  A yearly physical exam. This is also called an annual well check.  Regular dental and eye exams.  Immunizations.  Screening for certain conditions.  Healthy lifestyle choices, such as diet and exercise. What can I expect for my preventive care visit? Physical exam Your health care provider will check:  Height and weight. These may be used to calculate body mass index (BMI), which is a measurement that tells if you are at a healthy weight.  Heart rate and blood pressure.  Your skin for abnormal spots. Counseling Your health care provider may ask you questions about:  Alcohol, tobacco, and drug use.  Emotional well-being.  Home and relationship well-being.  Sexual activity.  Eating habits.  History of falls.  Memory and ability to understand (cognition).  Work and work environment.  Pregnancy and menstrual history. What immunizations do I need?  Influenza (flu) vaccine  This is recommended every year. Tetanus, diphtheria, and pertussis (Tdap) vaccine  You may need a Td booster every 10 years. Varicella (chickenpox) vaccine  You may need this vaccine if you have not already been vaccinated. Zoster (shingles) vaccine  You may need this after age 74. Pneumococcal conjugate (PCV13) vaccine  One dose is recommended after age 74. Pneumococcal polysaccharide (PPSV23) vaccine  One dose is recommended after age 74. Measles, mumps, and rubella (MMR) vaccine  You may need at least one dose of MMR if you were born in 1957 or later. You may also  need a second dose. Meningococcal conjugate (MenACWY) vaccine  You may need this if you have certain conditions. Hepatitis A vaccine  You may need this if you have certain conditions or if you travel or work in places where you may be exposed to hepatitis A. Hepatitis B vaccine  You may need this if you have certain conditions or if you travel or work in places where you may be exposed to hepatitis B. Haemophilus influenzae type b (Hib) vaccine  You may need this if you have certain conditions. You may receive vaccines as individual doses or as more than one vaccine together in one shot (combination vaccines). Talk with your health care provider about the risks and benefits of combination vaccines. What tests do I need? Blood tests  Lipid and cholesterol levels. These may be checked every 5 years, or more frequently depending on your overall health.  Hepatitis C test.  Hepatitis B test. Screening  Lung cancer screening. You may have this screening every year starting at age 55 if you have a 30-pack-year history of smoking and currently smoke or have quit within the past 15 years.  Colorectal cancer screening. All adults should have this screening starting at age 50 and continuing until age 75. Your health care provider may recommend screening at age 45 if you are at increased risk. You will have tests every 1-10 years, depending on your results and the type of screening test.  Diabetes screening. This is done by checking your blood sugar (glucose) after you have not eaten for a while (fasting). You may have this done every 1-3   years.  Mammogram. This may be done every 1-2 years. Talk with your health care provider about how often you should have regular mammograms.  BRCA-related cancer screening. This may be done if you have a family history of breast, ovarian, tubal, or peritoneal cancers. Other tests  Sexually transmitted disease (STD) testing.  Bone density scan. This is done  to screen for osteoporosis. You may have this done starting at age 74. Follow these instructions at home: Eating and drinking  Eat a diet that includes fresh fruits and vegetables, whole grains, lean protein, and low-fat dairy products. Limit your intake of foods with high amounts of sugar, saturated fats, and salt.  Take vitamin and mineral supplements as recommended by your health care provider.  Do not drink alcohol if your health care provider tells you not to drink.  If you drink alcohol: ? Limit how much you have to 0-1 drink a day. ? Be aware of how much alcohol is in your drink. In the [place] one drink equals one 12 oz bottle of beer (355 mL), one 5 oz glass of wine (148 mL), or one 1 oz glass of hard liquor (44 mL). Lifestyle  Take daily care of your teeth and gums.  Stay active. Exercise for at least 30 minutes on 5 or more days each week.  Do not use any products that contain nicotine or tobacco, such as cigarettes, e-cigarettes, and chewing tobacco. If you need help quitting, ask your health care provider.  If you are sexually active, practice safe sex. Use a condom or other form of protection in order to prevent STIs (sexually transmitted infections).  Talk with your health care provider about taking a low-dose aspirin or statin. What's next?  Go to your health care provider once a year for a well check visit.  Ask your health care provider how often you should have your eyes and teeth checked.  Stay up to date on all vaccines. This information is not intended to replace advice given to you by your health care provider. Make sure you discuss any questions you have with your health care provider. Document Released: 10/12/2015 Document Revised: 09/09/2018 Document Reviewed: 09/09/2018 Elsevier Patient Education  2020 [employer].

## 2019-08-29 NOTE — Progress Notes (Signed)
Presents today for [employer] Visit   Date of last exam: 05/27/2019  Interpreter used for this visit?  No   I connected with  Elizabeth Lin on 08/29/19  Telephone  and verified that I am speaking with the correct person using two identifiers.   I discussed the limitations of evaluation and management by telemedicine. The patient expressed understanding and agreed to proceed.    Patient Care Team: Louretta Shorten, MD as Consulting Physician (Obstetrics and Gynecology)   Other items to address today:  Discussed immunizations Discussed Eye/Dental Appointment  scheduled with Dr. Carlota Raspberry 12/3     Other Screening: Last screening for diabetes: 10/21/2017 Last lipid screening:06/24/2018   ADVANCE DIRECTIVES: Discussed: yes On File: no Materials Provided: yes  Immunization status:  Immunization History  Administered Date(s) Administered  . Influenza, Seasonal, Injecte, Preservative Fre 11/05/2012  . Influenza,inj,Quad PF,6+ Mos 06/21/2013, 09/01/2014  . Influenza-Unspecified 06/21/2016, 06/15/2017  . Pneumococcal Conjugate-13 09/04/2016  . Tdap 03/16/2012     Health Maintenance Due  Topic Date Due  . COLONOSCOPY  09/29/2013  . MAMMOGRAM  10/14/2018     Functional Status Survey: Is the patient deaf or have difficulty hearing?: Yes(hearing aids in both ears) Does the patient have difficulty seeing, even when wearing glasses/contacts?: No Does the patient have difficulty concentrating, remembering, or making decisions?: No Does the patient have difficulty walking or climbing stairs?: No Does the patient have difficulty dressing or bathing?: No Does the patient have difficulty doing errands alone such as visiting a doctor's office or shopping?: No   6CIT Screen 08/29/2019  What Year? 0 points  What month? 0 points  What time? 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 Meniere disease, left 12/12/2017  . Mild aortic stenosis 11/10/2017  . Bilateral edema of lower extremity 11/10/2017  . Family history of premature CAD 11/10/2017  . Arthralgia of multiple joints 10/23/2017  . Class 1 obesity due to excess calories without serious comorbidity with body mass index (BMI) of 32.0 to 32.9 in adult 10/23/2017  . Elevated blood pressure reading 10/23/2017  . New onset right bundle branch block (RBBB) 10/23/2017  . Pure hypercholesterolemia 10/23/2017  . Vitamin D deficiency 09/06/2014  . Right knee pain 02/24/2013  . Hypothyroid 11/02/2011  . Depression 11/02/2011  . History of positive PPD 11/02/2011     Past Medical History:  Diagnosis Date  . Allergy   . Anxiety   . Arthritis   . Cataract   . Depression   . Hammer toe   . Hypothyroidism due to Hashimoto's thyroiditis   . Thyroid disease      Past Surgical History:  Procedure Laterality Date  . APPENDECTOMY    . CYSTECTOMY     eye  . OOPHORECTOMY Right   . TONSILLECTOMY    . TRANSTHORACIC ECHOCARDIOGRAM  10/2016   No regional wall motion normality.  GR 1 DD.  Mild aortic stenosis (mean gradient 9 mmHg).  Mild LA dilation.  November 2018     Family History  Problem Relation Age of Onset  . Diabetes Father   . Myasthenia gravis Father        Died from complications in his 38s  . CAD Father        Was too sick with myasthenia gravis to have CABG  . Hypertension Father   . Thyroid disease Mother   . Cancer Mother        Lung -died at age 60-1/2.  Marland Kitchen Depression Mother   . Mental illness Mother   . Hypertension Brother        Since age 74  .  Hyperlipidemia Brother   . Thyroid disease Daughter   . Depression Son   . Heart disease Maternal Grandmother   . Heart disease Paternal Grandmother   . Heart disease Paternal Grandfather      Social History   Socioeconomic History  . Marital status: Single    Spouse name: Not on file  . Number of children: 3  . Years of education: college  . Highest education level: Bachelor's degree (e.g., BA, AB, BS)  Occupational History  . 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 Financial resource strain: Not on file  . Food insecurity    Worry: Not on file    Inability: Not on file  . Transportation needs    Medical: Not on file    Non-medical: Not on file  Tobacco Use  . Smoking status: Former Smoker    Types: Cigarettes    Quit date: 11/01/1974    Years since quitting: 44.8  . Smokeless tobacco: Former Neurosurgeon    Quit date: 06/11/1982  Substance and Sexual Activity  . Alcohol use: No    Comment: occasional wine  . Drug use: No  . Sexual activity: Never  Lifestyle  . Physical activity    Days per week: Not on file    Minutes per session: Not on file  . Stress: Not on file  Relationships  . Social Musician on phone: Not on file    Gets together: Not on file    Attends religious service: Not on file    Active member of club or organization: Not on file    Attends meetings of clubs or organizations: Not on file    Relationship status: Not on file  . Intimate partner violence    Fear of current or ex partner: Not on file    Emotionally abused: Not on file    Physically abused: Not on file    Forced sexual activity: Not on file  Other Topics Concern  . Not on file  Social History Narrative   Patient is Divorced and lives with oldest daughter and grandson.  3 children, 3 grandchildren.   Patient's  Education: [employer].  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   Patient is right handed   Caffeine consumption 2-3 daily    Walks daily for roughly 30 minutes at a time.  She has not been doing it in the wintertime due to cold weather.        Allergies  Allergen Reactions  . Penicillins Rash  . Amoxicillin-Pot Clavulanate     Red rash     Prior to Admission medications   Medication Sig Start Date End Date Taking? Authorizing Provider  Calcium-Vitamin D-Vitamin K (CALCIUM +  D + K PO) Take 1,500 mg by mouth daily. 1500 mg+D1000 IU   Yes [provider]  DULoxetine (CYMBALTA) 30 MG capsule TAKE 1 CAPSULE BY MOUTH EVERY DAY 06/24/19  Yes Shade FloodGreene, Jeffrey R, MD  fish oil-omega-3 fatty acids 1000 MG capsule Take 2 g by mouth daily.   Yes [provider]  GLUCOSAMINE CHONDROITIN COMPLX PO Take 2,000 Units by mouth daily.   Yes [provider]  levothyroxine (SYNTHROID, LEVOTHROID) 150 MCG tablet Take 1 tablet (150 mcg total) by mouth daily. 11/19/18  Yes Shade FloodGreene, Jeffrey R, MD  triamterene-hydrochlorothiazide [facility](MAXZIDE-25) 37.5-25 MG tablet TAKE 1 TABLET BY MOUTH EVERY DAY 06/24/19  Yes Shade FloodGreene, Jeffrey R, MD     [facility] 08/29/2019 05/27/2019 12/29/2018 12/02/2018 11/19/2018  Decreased Interest 0 0 0 0 0  Down, Depressed, Hopeless 0 0 0 0 0  PHQ - 2 Score 0 0 0 0 0  Altered sleeping - - - - -  Tired, decreased energy - - - - -  Change in appetite - - - - -  Feeling bad or failure about yourself  - - - - -  Trouble concentrating - - - - -  Moving slowly or fidgety/restless - - - - -  Suicidal thoughts - - - - -  PHQ-9 Score - - - - -  Difficult doing work/chores - - - - -     Fall Risk  08/29/2019 05/27/2019 12/29/2018 12/02/2018 11/19/2018  Falls in the past year? 0 0 0 0 0  Number falls in past yr: 0 0 0 0 0  Injury with Fall? 0 0 0 1 0  Follow up Falls evaluation completed;Education provided Falls evaluation completed - Falls evaluation completed Falls evaluation completed       PHYSICAL EXAM: BP 132/76 Comment: taken from previous visit  Ht 5' 5.75" (1.67 m)   Wt 215 lb (97.5 kg) Comment: taken from previous visit  BMI 34.97 kg/m    Wt Readings from Last 3 Encounters:  08/29/19 215 lb (97.5 kg)  05/27/19 215 lb 12.8 oz (97.9 kg)  12/02/18 215 lb 9.6 oz (97.8 kg)       Education/Counseling provided regarding diet and exercise, prevention of chronic diseases, smoking/tobacco cessation, if applicable, and reviewed "Covered Medicare Preventive Services."

## 2019-09-01 ENCOUNTER — Other Ambulatory Visit: Payer: Self-pay

## 2019-09-01 ENCOUNTER — Telehealth (INDEPENDENT_AMBULATORY_CARE_PROVIDER_SITE_OTHER): Payer: Medicare Other | Admitting: Family Medicine

## 2019-09-01 DIAGNOSIS — R140 Abdominal distension (gaseous): Secondary | ICD-10-CM | POA: Diagnosis not present

## 2019-09-01 DIAGNOSIS — R195 Other fecal abnormalities: Secondary | ICD-10-CM | POA: Diagnosis not present

## 2019-09-01 DIAGNOSIS — R198 Other specified symptoms and signs involving the digestive system and abdomen: Secondary | ICD-10-CM

## 2019-09-01 NOTE — Progress Notes (Signed)
Virtual Visit via video  Note  I connected with Elizabeth Lin on 09/01/19 at 1:58 PM  initial telephone call-  Difficult connection. Repeat call placed. Cutting in and out. Repeatedly had to repeat questions and difficulty with connection. Call ended.   2:05 PM Tried call with [employer] video. Connected.    verified that I am speaking with the correct person using two identifiers.   I discussed the limitations, risks, security and privacy concerns of performing an evaluation and management service by telephone and the availability of in person appointments. I also discussed with the patient that there may be a patient responsible charge related to this service. The patient expressed understanding and agreed to proceed, consent obtained  Chief complaint: Chief Complaint  Patient presents with  . Follow-up    pt stated--abdominal scar tissue on the right side surgery 2008 but no pain and more gassy     History of Present Illness: Elizabeth Lin is a 73 y.o. female  August 28 visit, discussed fullness in the right lower quadrant, variation in size over the previous few months, bloating and gas with starchy foods and fullness at times.  Also noticed some change in her stools with color and consistency for previous few months as well as weight gain and reported increased waist circumference. She had an area that was suspicious for hernia on the right lower abdominal wall.  Initial plan for ultrasound, then possible CT.  Also advised that she follow-up with gastroenterology -  plan for me to refer her once she advised me of specific provider she had seen previously.  We did discuss fiber and fluids in the diet to minimize the risk of constipation/straining. Ultrasound of the abdominal wall on September 3 did not indicate any hernia, mass or fluid collection.  Still with fullness in RLQ no pain. Same as in august, but not going in and out - same size. No scar in that area.  No blood  in stools.  No constipation, no hard stools.  Regular stools, just more loose. Still some loose stools at times, some bloating at times and gas. Past 6-8 months.  Caffeine: 2 cups coffee per day. Similar past year.  Prior GI: only for colonoscopy - Dr. Elnoria Howard, May 2015. Colon appeared normal.  bloating before gas only.  Occasional slight nausea.     Patient Active Problem List   Diagnosis Date Noted  . Meniere disease, left 12/12/2017  . Mild aortic stenosis 11/10/2017  . Bilateral edema of lower extremity 11/10/2017  . Family history of premature CAD 11/10/2017  . Arthralgia of multiple joints 10/23/2017  . Class 1 obesity due to excess calories without serious comorbidity with body mass index (BMI) of 32.0 to 32.9 in adult 10/23/2017  . Elevated blood pressure reading 10/23/2017  . New onset right bundle branch block (RBBB) 10/23/2017  . Pure hypercholesterolemia 10/23/2017  . Vitamin D deficiency 09/06/2014  . Right knee pain 02/24/2013  . Hypothyroid 11/02/2011  . Depression 11/02/2011  . 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 Allergy   . Anxiety   . Arthritis   . Cataract   . Depression   . Hammer toe   . Hypothyroidism due to Hashimoto's thyroiditis   . Thyroid disease    Past Surgical History:  Procedure Laterality Date  . APPENDECTOMY    . CYSTECTOMY     eye  . OOPHORECTOMY Right   . TONSILLECTOMY    .  TRANSTHORACIC ECHOCARDIOGRAM  10/2016   No regional wall motion normality.  GR 1 DD.  Mild aortic stenosis (mean gradient 9 mmHg).  Mild LA dilation.  November 2018   Allergies  Allergen Reactions  . Penicillins Rash  . Amoxicillin-Pot Clavulanate     Red rash   Prior to Admission medications   Medication Sig Start Date End Date Taking? Authorizing Provider  Calcium-Vitamin D-Vitamin K (CALCIUM + D + K PO) Take 1,500 mg by mouth daily. 1500 mg+D1000 IU   Yes [provider]  DULoxetine (CYMBALTA) 30 MG  capsule TAKE 1 CAPSULE BY MOUTH EVERY DAY 06/24/19  Yes Wendie Agreste, MD  fish oil-omega-3 fatty acids 1000 MG capsule Take 2 g by mouth daily.   Yes [provider]  GLUCOSAMINE CHONDROITIN COMPLX PO Take 2,000 Units by mouth daily.   Yes [provider]  levothyroxine (SYNTHROID, LEVOTHROID) 150 MCG tablet Take 1 tablet (150 mcg total) by mouth daily. 11/19/18  Yes Wendie Agreste, MD  triamterene-hydrochlorothiazide (DYAZIDE) 37.5-25 MG capsule triamterene 37.5 mg-hydrochlorothiazide 25 mg capsule   Yes [provider]  triamterene-hydrochlorothiazide (MAXZIDE-25) 37.5-25 MG tablet TAKE 1 TABLET BY MOUTH EVERY DAY 06/24/19  Yes Wendie Agreste, MD   Social History   Socioeconomic History  . Marital status: Single    Spouse name: Not on file  . Number of children: 3  . Years of education: college  . Highest education level: Bachelor's degree (e.g., BA, AB, BS)  Occupational History  . 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 Financial resource strain: Not on file  . Food insecurity    Worry: Not on file    Inability: Not on file  . Transportation needs    Medical: Not on file    Non-medical: Not on file  Tobacco Use  . Smoking status: Former Smoker    Types: Cigarettes    Quit date: 11/01/1974    Years since quitting: 44.8  . Smokeless tobacco: Former Systems developer    Quit date: 06/11/1982  Substance and Sexual Activity  . Alcohol use: No    Comment: occasional wine  . Drug use: No  . Sexual activity: Never  Lifestyle  . Physical activity    Days per week: Not on file    Minutes per session: Not on file  . Stress: Not on file  Relationships  . Social Herbalist on phone: Not on file    Gets together: Not on file    Attends religious service: Not on file    Active member of club or organization: Not on file    Attends meetings of clubs or organizations: Not on file    Relationship  status: Not on file  . Intimate partner violence    Fear of current or ex partner: Not on file    Emotionally abused: Not on file    Physically abused: Not on file    Forced sexual activity: Not on file  Other Topics Concern  . Not on file  Social History Narrative   Patient is Divorced and lives with oldest daughter and grandson.  3 children, 3 grandchildren.   Patient's  Education: [employer].  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.   Patient is right handed   Caffeine consumption 2-3 daily    Walks daily for roughly 30 minutes at a time.  She has not been doing it in the wintertime due to cold weather.        Observations/Objective: No distress on video, all questions answered.  Understanding expressed of plan. There were no vitals filed for this visit.  Assessment and Plan: RLQ fullness - Plan: Ambulatory referral to Gastroenterology  Change in stool - Plan: Ambulatory referral to Gastroenterology  Bloating symptom - Plan: Ambulatory referral to Gastroenterology  Reassuring previous ultrasound of localized area of concern.  Still some intermittent bloating with increased gas, otherwise no pain.  No change in size and not fluctuating in size per her report.  -Low FODMAP diet discussed for gas/bloating  -Refer to gastroenterology to discuss the symptoms as well as change in stools  -Recommended she have gastroenterology evaluate the fullness sensation as well, but consider further imaging with CT versus complete ultrasound versus surgical eval.  RTC precautions given.  Advised to update me with plan in the next few weeks.  Follow Up Instructions:   To be determined after GI eval .  Sooner if worse.  I discussed the assessment and treatment plan with the patient. The patient was provided an opportunity to ask questions and all were answered. The patient agreed with the plan and demonstrated an  understanding of the instructions.   The patient was advised to call back or seek an in-person evaluation if the symptoms worsen or if the condition fails to improve as anticipated.  I provided 15 minutes of non-face-to-face time during this encounter.  Signed,   Meredith StaggersJeffrey Ronald Vinsant, MD [facility].  09/01/19

## 2019-09-01 NOTE — Patient Instructions (Signed)
I will refer you to gastroenterology.  I would like them to discuss the bloating, and evaluate the fullness sensation as well.  Next step would be possibly CAT scan or complete abdominal and pelvic ultrasound although the ultrasound of the specific area looked okay.  For now try to avoid foods that are associated with increased bloating and gas, specifically try to avoid FODMAPs.  Let me know how things are going in the next 3 to 4 weeks.  You can send me a MyChart message or we can schedule  another visit. Return to the clinic or go to the nearest emergency room if any of your symptoms worsen or new symptoms occur.   Low-FODMAP Eating Plan  FODMAPs (fermentable oligosaccharides, disaccharides, monosaccharides, and polyols) are sugars that are hard for some people to digest. A low-FODMAP eating plan may help some people who have bowel (intestinal) diseases to manage their symptoms. This meal plan can be complicated to follow. Work with a diet and nutrition specialist (dietitian) to make a low-FODMAP eating plan that is right for you. A dietitian can make sure that you get enough nutrition from this diet. What are tips for following this plan? Reading food labels  Check labels for hidden FODMAPs such as: ? High-fructose syrup. ? Honey. ? Agave. ? Natural fruit flavors. ? Onion or garlic powder.  Choose low-FODMAP foods that contain 3-4 grams of fiber per serving.  Check food labels for serving sizes. Eat only one serving at a time to make sure FODMAP levels stay low. Meal planning  Follow a low-FODMAP eating plan for up to 6 weeks, or as told by your health care provider or dietitian.  To follow the eating plan: 1. Eliminate high-FODMAP foods from your diet completely. 2. Gradually reintroduce high-FODMAP foods into your diet one at a time. Most people should wait a few days after introducing one high-FODMAP food before they introduce the next high-FODMAP food. Your dietitian can  recommend how quickly you may reintroduce foods. 3. Keep a daily record of what you eat and drink, and make note of any symptoms that you have after eating. 4. Review your daily record with a dietitian regularly. Your dietitian can help you identify which foods you can eat and which foods you should avoid. General tips  Drink enough fluid each day to keep your urine pale yellow.  Avoid processed foods. These often have added sugar and may be high in FODMAPs.  Avoid most dairy products, whole grains, and sweeteners.  Work with a dietitian to make sure you get enough fiber in your diet. Recommended foods Grains  Gluten-free grains, such as rice, oats, buckwheat, quinoa, corn, polenta, and millet. Gluten-free pasta, bread, or cereal. Rice noodles. Corn tortillas. Vegetables  Eggplant, zucchini, cucumber, peppers, green beans, Brussels sprouts, bean sprouts, lettuce, arugula, kale, Swiss chard, spinach, collard greens, bok choy, summer squash, potato, and tomato. Limited amounts of corn, carrot, and sweet potato. Green parts of scallions. Fruits  Bananas, oranges, lemons, limes, blueberries, raspberries, strawberries, grapes, cantaloupe, honeydew melon, kiwi, papaya, passion fruit, and pineapple. Limited amounts of dried cranberries, banana chips, and shredded coconut. Dairy  Lactose-free milk, yogurt, and kefir. Lactose-free cottage cheese and ice cream. Non-dairy milks, such as almond, coconut, hemp, and rice milk. Yogurts made of non-dairy milks. Limited amounts of goat cheese, brie, mozzarella, parmesan, swiss, and other hard cheeses. Meats and other protein foods  Unseasoned beef, pork, poultry, or fish. Eggs. Tomasa BlaseBacon. Tofu (firm) and tempeh. Limited amounts of nuts and  seeds, such as almonds, walnuts, [place] nuts, pecans, peanuts, pumpkin seeds, chia seeds, and sunflower seeds. Fats and oils  Butter-free spreads. Vegetable oils, such as olive, canola, and sunflower oil. Seasoning and  other foods  Artificial sweeteners with names that do not end in "ol" such as aspartame, saccharine, and stevia. Maple syrup, white table sugar, raw sugar, brown sugar, and molasses. Fresh basil, coriander, parsley, rosemary, and thyme. Beverages  Water and mineral water. Sugar-sweetened soft drinks. Small amounts of orange juice or cranberry juice. Black and green tea. Most dry wines. Coffee. This may not be a complete list of low-FODMAP foods. Talk with your dietitian for more information. Foods to avoid Grains  Wheat, including kamut, durum, and semolina. Barley and bulgur. Couscous. Wheat-based cereals. Wheat noodles, bread, crackers, and pastries. Vegetables  Chicory root, artichoke, asparagus, cabbage, snow peas, sugar snap peas, mushrooms, and cauliflower. Onions, garlic, leeks, and the white part of scallions. Fruits  Fresh, dried, and juiced forms of apple, pear, watermelon, peach, plum, cherries, apricots, blackberries, boysenberries, figs, nectarines, and mango. Avocado. Dairy  Milk, yogurt, ice cream, and soft cheese. Cream and sour cream. Milk-based sauces. Custard. Meats and other protein foods  Fried or fatty meat. Sausage. Cashews and pistachios. Soybeans, baked beans, black beans, chickpeas, kidney beans, fava beans, navy beans, lentils, and split peas. Seasoning and other foods  Any sugar-free gum or candy. Foods that contain artificial sweeteners such as sorbitol, mannitol, isomalt, or xylitol. Foods that contain honey, high-fructose corn syrup, or agave. Bouillon, vegetable stock, beef stock, and chicken stock. Garlic and onion powder. Condiments made with onion, such as hummus, chutney, pickles, relish, salad dressing, and salsa. Tomato paste. Beverages  Chicory-based drinks. Coffee substitutes. Chamomile tea. Fennel tea. Sweet or fortified wines such as port or sherry. Diet soft drinks made with isomalt, mannitol, maltitol, sorbitol, or xylitol. Apple, pear, and  mango juice. Juices with high-fructose corn syrup. This may not be a complete list of high-FODMAP foods. Talk with your dietitian to discuss what dietary choices are best for you.  Summary  A low-FODMAP eating plan is a short-term diet that eliminates FODMAPs from your diet to help ease symptoms of certain bowel diseases.  The eating plan usually lasts up to 6 weeks. After that, high-FODMAP foods are restarted gradually, one at a time, so you can find out which may be causing symptoms.  A low-FODMAP eating plan can be complicated. It is best to work with a dietitian who has experience with this type of plan. This information is not intended to replace advice given to you by your health care provider. Make sure you discuss any questions you have with your health care provider. Document Released: 05/12/2017 Document Revised: 08/28/2017 Document Reviewed: 05/12/2017 Elsevier Patient Education  2020 Elsevier Inc.  Abdominal Bloating When you have abdominal bloating, your abdomen may feel full, tight, or painful. It may also look bigger than normal or swollen (distended). Common causes of abdominal bloating include:  Swallowing air.  Constipation.  Problems digesting food.  Eating too much.  Irritable bowel syndrome. This is a condition that affects the large intestine.  Lactose intolerance. This is an inability to digest lactose, a natural sugar in dairy products.  Celiac disease. This is a condition that affects the ability to digest gluten, a protein found in some grains.  Gastroparesis. This is a condition that slows down the movement of food in the stomach and small intestine. It is more common in people with diabetes mellitus.  Gastroesophageal  reflux disease (GERD). This is a digestive condition that makes stomach acid flow back into the esophagus.  Urinary retention. This means that the body is holding onto urine, and the bladder cannot be emptied all the way. Follow these  instructions at home: Eating and drinking  Avoid eating too much.  Try not to swallow air while talking or eating.  Avoid eating while lying down.  Avoid these foods and drinks: ? Foods that cause gas, such as broccoli, cabbage, cauliflower, and baked beans. ? Carbonated drinks. ? Hard candy. ? Chewing gum. Medicines  Take over-the-counter and prescription medicines only as told by your health care provider.  Take probiotic medicines. These medicines contain live bacteria or yeasts that can help digestion.  Take coated peppermint oil capsules. Activity  Try to exercise regularly. Exercise may help to relieve bloating that is caused by gas and relieve constipation. General instructions  Keep all follow-up visits as told by your health care provider. This is important. Contact a health care provider if:  You have nausea and vomiting.  You have diarrhea.  You have abdominal pain.  You have unusual weight loss or weight gain.  You have severe pain, and medicines do not help. Get help right away if:  You have severe chest pain.  You have trouble breathing.  You have shortness of breath.  You have trouble urinating.  You have darker urine than normal.  You have blood in your stools or have dark, tarry stools. Summary  Abdominal bloating means that the abdomen is swollen.  Common causes of abdominal bloating are swallowing air, constipation, and problems digesting food.  Avoid eating too much and avoid swallowing air.  Avoid foods that cause gas, carbonated drinks, hard candy, and chewing gum. This information is not intended to replace advice given to you by your health care provider. Make sure you discuss any questions you have with your health care provider. Document Released: 10/17/2016 Document Revised: 01/03/2019 Document Reviewed: 10/17/2016 Elsevier Patient Education  2020 [employer].

## 2019-09-07 ENCOUNTER — Ambulatory Visit: Payer: Medicare Other | Admitting: Physician Assistant

## 2019-09-07 ENCOUNTER — Encounter: Payer: Self-pay | Admitting: Physician Assistant

## 2019-09-07 VITALS — BP 124/72 | HR 66 | Temp 98.4°F | Ht 66.0 in | Wt 213.0 lb

## 2019-09-07 DIAGNOSIS — R194 Change in bowel habit: Secondary | ICD-10-CM | POA: Diagnosis not present

## 2019-09-07 DIAGNOSIS — R1031 Right lower quadrant pain: Secondary | ICD-10-CM

## 2019-09-07 DIAGNOSIS — R198 Other specified symptoms and signs involving the digestive system and abdomen: Secondary | ICD-10-CM | POA: Diagnosis not present

## 2019-09-07 NOTE — Progress Notes (Signed)
Agree with assessment and plan as outlined.  

## 2019-09-07 NOTE — Patient Instructions (Signed)
Your provider has requested that you go to the basement level for lab work before leaving today. Press "B" on the elevator. The lab is located at the first door on the left as you exit the elevator.  You have been scheduled for a CT scan of the abdomen and pelvis at [facility], 1st floor radiology.   You are scheduled on 09/16/2019 at 8:30am. You should arrive 15 minutes prior to your appointment time for registration. Please follow the written instructions below on the day of your exam:  WARNING: IF YOU ARE ALLERGIC TO IODINE/X-RAY DYE, PLEASE NOTIFY RADIOLOGY IMMEDIATELY AT [phone]! YOU WILL BE GIVEN A 13 HOUR PREMEDICATION PREP.  1) Do not eat anything after 4:30am (4 hours prior to your test) 2) You have been given 2 bottles of oral contrast to drink. The solution may taste better if refrigerated, but do NOT add ice or any other liquid to this solution. Shake well before drinking.    Drink 1 bottle of contrast @ 6:30am (2 hours prior to your exam)  Drink 1 bottle of contrast @ 7:30am (1 hour prior to your exam)  You may take any medications as prescribed with a small amount of water, if necessary. If you take any of the following medications: METFORMIN, GLUCOPHAGE, GLUCOVANCE, AVANDAMET, RIOMET, FORTAMET, [place] MET, JANUMET, GLUMETZA or METAGLIP, you MAY be asked to HOLD this medication 48 hours AFTER the exam.  The purpose of you drinking the oral contrast is to aid in the visualization of your intestinal tract. The contrast solution may cause some diarrhea. Depending on your individual set of symptoms, you may also receive an intravenous injection of x-ray contrast/dye. Plan on being at [facility] for 30 minutes or longer, depending on the type of exam you are having performed.  This test typically takes 30-45 minutes to complete.  If you have any questions regarding your exam or if you need to reschedule, you may call the CT department at [phone] between the  hours of 8:00 am and 5:00 pm, Monday-Friday.  Decrease Lactose intake  Add Citrucel or Benefiber - 1 scoop daily in 6-8 ounces of water.   ________________________________________________________________________

## 2019-09-07 NOTE — Progress Notes (Signed)
Subjective:    Patient ID: Elizabeth Lin, female    DOB: 26-Apr-1946, 73 y.o.   MRN: [id number]  HPI Elizabeth Lin is a pleasant 73 year old white female, new to GI today referred by Dr. Meredith Lin for evaluation of change in bowel habits abdominal bloating and gassiness. Patient has history of hypothyroidism, obesity, mild aortic stenosis and arthritis. Patient had prior colonoscopy with Dr. Jeani Lin in 2015, and this was a normal exam. She reports that she had surgery in 2008 in [facility] with appendectomy, oophorectomy and resection of a piece of intestine.  She is not sure if the final diagnosis was acute appendicitis or not.  We do not have those records. Patient says she has been noticing a change in her bowel habits over the past 6 to 8 months with softer bowel movements consistently and some alteration in color.  She is not having diarrhea and has not noted any melena or hematochezia.  She has 1-2 bowel movements per day.  She denies any real abdominal pain but has noticed an increase in gassiness and some bloating.  Appetite has been fine, weight has been stable.  No nausea or vomiting. She last took antibiotics several months ago, she does not use any artificial sweeteners, she has been eating a lot more cheese over the past few months and less meat. She takes a variety of supplements and brought them with her today including omega-3, vitamin C, calcium carbonate, and glucosamine/chondroitin.  She says she has been on all of these medicines for the past couple of years at similar doses and has not changed anything recently. And she mentions that she has noted a fullness or bulge in her right lower abdomen over the past 5 to 6 months as well.  She says she had never noticed that previously, does have some tenderness or discomfort there off and on. She underwent pelvic ultrasound in September 2020 for evaluation of this area which was negative. She was told that a CT scan  may be more beneficial but she had been afraid to have CT with IV contrast because her mother had a contrast reaction many years ago.  Patient herself has not had any problems with IV contrast that she is aware of.  Review of Systems Pertinent positive and negative review of systems were noted in the above HPI section.  All other review of systems was otherwise negative.  Outpatient Encounter Medications as of 09/07/2019  Medication Sig  . Ascorbic Acid (VITAMIN C) 1000 MG tablet Take 1,000 mg by mouth 3 (three) times daily.  . Calcium-Vitamin D-Vitamin K (CALCIUM + D + K PO) Take 1,500 mg by mouth daily. 1500 mg+D1000 IU  . DULoxetine (CYMBALTA) 30 MG capsule TAKE 1 CAPSULE BY MOUTH EVERY DAY  . fish oil-omega-3 fatty acids 1000 MG capsule Take 2 g by mouth daily.  Marland Kitchen GLUCOSAMINE CHONDROITIN COMPLX PO Take 2,000 Units by mouth daily.  Marland Kitchen levothyroxine (SYNTHROID, LEVOTHROID) 150 MCG tablet Take 1 tablet (150 mcg total) by mouth daily.  Marland Kitchen triamterene-hydrochlorothiazide (MAXZIDE-25) 37.5-25 MG tablet TAKE 1 TABLET BY MOUTH EVERY DAY  . [DISCONTINUED] triamterene-hydrochlorothiazide (DYAZIDE) 37.5-25 MG capsule triamterene 37.5 mg-hydrochlorothiazide 25 mg capsule   No facility-administered encounter medications on file as of 09/07/2019.    Allergies  Allergen Reactions  . Penicillins Rash  . Amoxicillin-Pot Clavulanate     Red rash   Patient Active Problem List   Diagnosis Date Noted  . Meniere disease, left 12/12/2017  . Mild  aortic stenosis 11/10/2017  . Bilateral edema of lower extremity 11/10/2017  . Family history of premature CAD 11/10/2017  . Arthralgia of multiple joints 10/23/2017  . Class 1 obesity due to excess calories without serious comorbidity with body mass index (BMI) of 32.0 to 32.9 in adult 10/23/2017  . Elevated blood pressure reading 10/23/2017  . New onset right bundle branch block (RBBB) 10/23/2017  . Pure hypercholesterolemia 10/23/2017  . Vitamin D deficiency  09/06/2014  . Right knee pain 02/24/2013  . Hypothyroid 11/02/2011  . Depression 11/02/2011  . History of positive PPD 11/02/2011   Social History   Socioeconomic History  . Marital status: Single    Spouse name: Not on file  . Number of children: 3  . Years of education: college  . Highest education level: Bachelor's degree (e.g., BA, AB, BS)  Occupational History  . 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 Financial resource strain: Not on file  . Food insecurity    Worry: Not on file    Inability: Not on file  . Transportation needs    Medical: Not on file    Non-medical: Not on file  Tobacco Use  . Smoking status: Former Smoker    Types: Cigarettes    Quit date: 11/01/1974    Years since quitting: 44.8  . Smokeless tobacco: Never Used  Substance and Sexual Activity  . Alcohol use: No    Comment: occasional wine  . Drug use: No  . Sexual activity: Never  Lifestyle  . Physical activity    Days per week: Not on file    Minutes per session: Not on file  . Stress: Not on file  Relationships  . Social Musicianconnections    Talks on phone: Not on file    Gets together: Not on file    Attends religious service: Not on file    Active member of club or organization: Not on file    Attends meetings of clubs or organizations: Not on file    Relationship status: Not on file  . Intimate partner violence    Fear of current or ex partner: Not on file    Emotionally abused: Not on file    Physically abused: Not on file    Forced sexual activity: Not on file  Other Topics Concern  . Not on file  Social History Narrative   Patient is Divorced and lives with oldest daughter and grandson.  3 children, 3 grandchildren.   Patient's  Education: [employer].  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   Patient is right handed   Caffeine consumption 2-3 daily     Walks daily for roughly 30 minutes at a time.  She has not been doing it in the wintertime due to cold weather.       Ms. Hardie ShackletonHernandez's family history includes CAD in her father; Cancer in her mother; Depression in her mother and son; Diabetes in her father; Heart disease in her maternal grandmother, paternal grandfather, and paternal grandmother; Hyperlipidemia in her brother; Hypertension in her brother and father; Mental illness in her mother; Myasthenia gravis in her father; Thyroid disease in her daughter and mother.      Objective:    Vitals:   09/07/19 1458  BP: 124/72  Pulse: 66  Temp: 98.4 F (36.9 C)    Physical Exam Well-developed well-nourished el WF  in  no acute distress. Pleasant , Weight,213  BMI 34.8  HEENT; nontraumatic normocephalic, EOMI, PE RR LA, sclera anicteric. Oropharynx; not examined/mask/Covid Neck; supple, no JVD Cardiovascular; regular rate and rhythm with S1-S2, no murmur rub or gallop Pulmonary; Clear bilaterally Abdomen; soft,, bowel sounds are present, she has a rounded fullness in the right lower quadrant 2 to 3 inches in diameter and tender to palpation, I cannot appreciate definite abdominal wall hernia  rectal; not done today Skin; benign exam, no jaundice rash or appreciable lesions Extremities; no clubbing cyanosis or edema skin warm and dry Neuro/Psych; alert and oriented x4, grossly nonfocal mood and affect appropriate       Assessment & Plan:   #12 73 year old white female with 6 to 65-month history of mild change in bowel habits with softer stools, and increase in gassiness and some bloating. In addition has noticed a fullness or protrusion in the right lower quadrant.  On exam she does have a rounded fullness in the right lower quadrant which is slightly tender.  Rule out right lower quadrant ventral hernia, rule out right lower quadrant soft tissue mass, rule out colonic lesion.  #2 colon cancer surveillance-negative colonoscopy  2015/Dr. Benson Norway #3 hypothyroidism #4.  Arthritis #5.  Obesity #6.  Status post appendectomy/ oophorectomy and possible bowel resection 2008/[facility]; Patient will be scheduled for CT scan of the abdomen and pelvis with contrast. Decrease lactose intake i.e. particularly cheese Add trial of fiber supplement, Citrucel or Benefiber 1 scoop in 6 to 8 ounces of water daily Further recommendations pending findings at CT. Patient will be established with Dr. Havery Moros.  Lamekia Nolden S Eustacia Urbanek PA-C 09/07/2019   Cc: Wendie Agreste, MD

## 2019-09-08 ENCOUNTER — Other Ambulatory Visit (INDEPENDENT_AMBULATORY_CARE_PROVIDER_SITE_OTHER): Payer: Medicare Other

## 2019-09-08 DIAGNOSIS — R194 Change in bowel habit: Secondary | ICD-10-CM

## 2019-09-08 DIAGNOSIS — R198 Other specified symptoms and signs involving the digestive system and abdomen: Secondary | ICD-10-CM | POA: Diagnosis not present

## 2019-09-08 DIAGNOSIS — R1031 Right lower quadrant pain: Secondary | ICD-10-CM | POA: Diagnosis not present

## 2019-09-08 LAB — BASIC METABOLIC PANEL
BUN: 20 mg/dL (ref 6–23)
CO2: 28 mEq/L (ref 19–32)
Calcium: 9.4 mg/dL (ref 8.4–10.5)
Chloride: 100 mEq/L (ref 96–112)
Creatinine, Ser: 0.91 mg/dL (ref 0.40–1.20)
GFR: 60.44 mL/min (ref >60.00)
Glucose, Bld: 80 mg/dL (ref 70–99)
Potassium: 3.7 mEq/L (ref 3.5–5.1)
Sodium: 139 mEq/L (ref 135–145)

## 2019-09-16 ENCOUNTER — Encounter (HOSPITAL_COMMUNITY): Payer: Self-pay

## 2019-09-16 ENCOUNTER — Ambulatory Visit (HOSPITAL_COMMUNITY)
Admission: RE | Admit: 2019-09-16 | Discharge: 2019-09-16 | Disposition: A | Discharge status: Home / Self Care | Payer: Medicare Other | Source: Ambulatory Visit | Attending: Physician Assistant | Admitting: Physician Assistant

## 2019-09-16 ENCOUNTER — Other Ambulatory Visit: Payer: Self-pay

## 2019-09-16 DIAGNOSIS — R198 Other specified symptoms and signs involving the digestive system and abdomen: Secondary | ICD-10-CM | POA: Diagnosis present

## 2019-09-16 DIAGNOSIS — R194 Change in bowel habit: Secondary | ICD-10-CM | POA: Insufficient documentation

## 2019-09-16 DIAGNOSIS — R1031 Right lower quadrant pain: Secondary | ICD-10-CM | POA: Insufficient documentation

## 2019-09-16 MED ORDER — IOHEXOL 300 MG/ML  SOLN
100.0000 mL | Freq: Once | INTRAMUSCULAR | Status: AC | PRN
  Administered 2019-09-16: 100 mL via INTRAVENOUS

## 2019-09-16 MED ORDER — SODIUM CHLORIDE (PF) 0.9 % IJ SOLN
INTRAMUSCULAR | Status: AC
  Filled 2019-09-16: qty 50

## 2019-09-19 ENCOUNTER — Telehealth: Payer: Self-pay | Admitting: Physician Assistant

## 2019-09-19 NOTE — Telephone Encounter (Signed)
Patient contacted and agrees to [facility] referral for abdominal wall hernia.

## 2019-09-28 ENCOUNTER — Telehealth: Payer: Self-pay | Admitting: Physician Assistant

## 2019-09-28 NOTE — Telephone Encounter (Signed)
Pt stated that she is scheduled for hernia surgery and would like to know whether she can exercise.

## 2019-09-29 NOTE — Telephone Encounter (Signed)
Patient has been exercising. This is not a new activity. She does not have pain during or after her exercises.  Okay to maintain her present level of activity? Her surgical consult is on 10/11/19.

## 2019-09-29 NOTE — Telephone Encounter (Signed)
Yes, exercise at her current level is fine as long as not causing the hernia to hurt- would not do a lot of heavy lifting.

## 2019-09-29 NOTE — Telephone Encounter (Signed)
Patient is advised.  

## 2019-10-10 DIAGNOSIS — F341 Dysthymic disorder: Secondary | ICD-10-CM | POA: Diagnosis not present

## 2019-10-11 ENCOUNTER — Ambulatory Visit: Payer: Self-pay | Admitting: Surgery

## 2019-10-11 DIAGNOSIS — K439 Ventral hernia without obstruction or gangrene: Secondary | ICD-10-CM | POA: Diagnosis not present

## 2019-10-11 NOTE — H&P (Signed)
History of Present Illness (Elizabeth Lin K. Romell Cavanah Elizabeth Lin; 10/11/2019 5:46 PM) The patient is a 73 year old female who presents with an inguinal hernia. Referred by Dr. Jeffrey Greene for RLQ hernia  The patient is alone but her daughter is on speakerphone throughout the entire visit.  This is a 73-year-old female with a history of Mnire's disease who presents with approximately 6-7 months of fullness and firmness in her right lower quadrant. She has begun to notice a change in the consistency of her stools. She denies constipation but notices that her stools are smaller and much softer than usual. She also reports some upper abdominal bloating and frequent mild nausea. She denies any vomiting. She has a past abdominal surgical history of laparoscopic appendectomy and right nephrectomy performed approximately 15 years ago at [facility]. We do not have those records. Recently she underwent CT scan that showed what appears to be a right spigelian hernia. The hernia sac seems to contain part of her right colon as well as some small bowel. This is not a full-thickness hernia as the overlying external oblique muscle seems to be intact. The patient had a CT scan performed in December 2015. Retrospective review that scan shows that she had a very small spigelian hernia at that time. The hernia has enlarged significantly in the last 5 years.   CLINICAL DATA: Right lower quadrant abdominal pain. Evaluate abdominal wall hernia.  EXAM: CT ABDOMEN AND PELVIS WITH CONTRAST  TECHNIQUE: Multidetector CT imaging of the abdomen and pelvis was performed using the standard protocol following bolus administration of intravenous contrast.  CONTRAST: 100mL OMNIPAQUE IOHEXOL 300 MG/ML SOLN  COMPARISON: CT scan 09/12/2014  FINDINGS: Lower chest: The lung bases are clear of acute process. No pleural effusion or pulmonary lesions. The heart is normal in size. No pericardial effusion. The distal esophagus  and aorta are unremarkable.  Hepatobiliary: No focal hepatic lesions or intrahepatic biliary dilatation. The gallbladder appears normal. No common bile duct dilatation. The portal and hepatic veins are patent.  Pancreas: No mass, inflammation or ductal dilatation.  Spleen: Normal size. No focal lesions.  Adrenals/Urinary Tract: Adrenal glands and kidneys are unremarkable. No renal, ureteral or bladder calculi or mass.  Stomach/Bowel: The stomach, duodenum, small bowel and colon are unremarkable. No acute inflammatory changes, mass lesions or obstructive findings.  Vascular/Lymphatic: The aorta is normal in caliber. No dissection. The branch vessels are patent. The major venous structures are patent. No mesenteric or retroperitoneal mass or adenopathy. Small scattered lymph nodes are noted.  Reproductive: The uterus and left ovary are normal. The right ovary is surgically absent. Slightly prominent parametrial vessels.  Other: There is a spigelian type abdominal wall hernia on the right side. This was also present on the prior study but is much larger and now contains the cecum and terminal ileum. The hernia is between the rectus abdominus muscles and the oblique abdominal muscles and appears contained by the fascia of the external oblique muscle. No findings for obstruction or incarceration. The appendix is surgically absent.  Musculoskeletal: No significant bony findings.  IMPRESSION: 1. Moderate to large right-sided spigelian type low abdominal wall hernia containing the cecum and terminal ileum. This has enlarged since the prior CT scan. No CT findings for obstruction or incarceration. 2. No acute abdominal/pelvic findings, mass lesions or adenopathy.   Electronically Signed By: P. Gallerani M.D. On: 09/16/2019 09:12   Problem List/Past Medical (Elizabeth Lin K. 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 DULoxetine HCl (30MG  Capsule DR Part, Oral) Active. Triamterene-HCTZ (37.5-25MG  Tablet, Oral) Active. Medications Reconciled  Social History Elizabeth Lin, [place]; 10/11/2019 3:15 PM) Caffeine use Coffee.  Family History Elizabeth Lin, [place]; 10/11/2019 3:15 PM) Arthritis Mother. Heart Disease Father. Hypertension Brother. Thyroid problems Mother.  Pregnancy / Birth History Elizabeth Lin, [place]; 10/11/2019 3:15 PM) Age at menarche 15 years. Age of menopause 62-60 Gravida 1 Maternal age 22-25 Para 3  Other Problems Elizabeth Lin. Elizabeth Principato, Elizabeth Lin; 10/11/2019 5:46 PM) Anxiety Disorder Arthritis Oophorectomy Right.     Review of Systems Elizabeth Lin CMA; 10/11/2019 3:15 PM) HEENT Present- Hearing Loss and Seasonal Allergies. Not Present- Earache, Hoarseness, Nose Bleed, Oral Ulcers, Ringing in the Ears, Sinus Pain, Sore Throat, Visual Disturbances, Wears glasses/contact lenses and Yellow Eyes. Respiratory Not Present- Bloody sputum, Chronic Cough, Difficulty Breathing, Snoring and Wheezing. Breast Not Present- Breast Mass, Breast Pain, Nipple Discharge and Skin Changes. Cardiovascular Present- Swelling of Extremities. Not Present- Chest Pain, Difficulty Breathing Lying Down, Leg Cramps, Palpitations, Rapid Heart Rate and Shortness of Breath. Gastrointestinal Present- Change in Bowel Habits and Nausea. Not Present- Abdominal Pain, Bloating, Bloody Stool, Chronic diarrhea, Constipation, Difficulty Swallowing, Excessive gas, Gets full quickly at meals,  Hemorrhoids, Indigestion, Rectal Pain and Vomiting. Musculoskeletal Present- Joint Stiffness and Swelling of Extremities. Not Present- Back Pain, Joint Pain, Muscle Pain and Muscle Weakness. Neurological Present- Numbness and Tingling. Not Present- Decreased Memory, Fainting, Headaches, Seizures, Tremor, Trouble walking and Weakness. Endocrine Not Present- Cold Intolerance, Excessive Hunger, Hair Changes, Heat Intolerance, Hot flashes and New Diabetes.  Vitals Elizabeth Lin CMA; 10/11/2019 3:16 PM) 10/11/2019 3:16 PM Weight: 213.2 lb Height: 65in Body Surface Area: 2.03 m Body Mass Index: 35.48 kg/m  Temp.: 97.52F  Pulse: 76 (Regular)  BP: 150/88 (Sitting, Left Arm, Standard)        Physical Exam Elizabeth Key K. Leathia Farnell Elizabeth Lin; 10/11/2019 5:46 PM)  The physical exam findings are as follows: Note:WDWN in NAD Eyes: Pupils equal, round; sclera anicteric HENT: Oral mucosa moist; good dentition Neck: No masses palpated, no thyromegaly Lungs: CTA bilaterally; normal respiratory effort CV: Regular rate and rhythm; no murmurs; extremities well-perfused with some mild edema Abd: +bowel sounds, soft, non-tender, no palpable organomegaly;firmness/ fullness in RLQ - not reducible; Skin: Warm, dry; no sign of jaundice Psychiatric - alert and oriented x 4; calm mood and affect    Assessment & Plan Elizabeth Key K. 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 The surgical procedure has been discussed with the patient. Potential risks, benefits, alternative treatments, and expected outcomes have been explained. All of the patient's questions at this time have been answered. The likelihood of reaching the patient's treatment goal is good. The patient understand the proposed surgical procedure and wishes to proceed.  Surgery will be delayed due to COVID restrictions.  Note:I spent about  25 minutes of this 30 minute appointment examining the patient and discussing her situation with the patient and her daughter. They had very many questions which I answered to the best of my ability. At this time, [place] is beginning  to restrict elective surgical procedures that need to be performed at an inpatient facility. This type of hernia repair is not safely performed at an outpatient setting with immediate discharge. The patient will likely require 1-2 days of admission after surgery. I explained to the patient and her daughter that her case may be delayed for a couple of months. However if her condition changes and she becomes more obstructed with more frequent nausea and vomiting and decrease in bowel movements, we will move her to a priority one case and will proceed with hernia repair. They have agreed to notify me of any change in her status.   Wilmon Arms. Corliss Skains, Elizabeth Lin, [facility]  General/ Trauma Surgery   10/11/2019 5:49 PM

## 2019-10-11 NOTE — H&P (View-Only) (Signed)
History of Present Illness Wilmon Arms. Nakyiah Kuck MD; 10/11/2019 5:46 PM) The patient is a 74 year old female who presents with an inguinal hernia. Referred by Dr. Meredith Staggers for RLQ hernia  The patient is alone but her daughter is on speakerphone throughout the entire visit.  This is a 74 year old female with a history of Mnire's disease who presents with approximately 6-7 months of fullness and firmness in her right lower quadrant. She has begun to notice a change in the consistency of her stools. She denies constipation but notices that her stools are smaller and much softer than usual. She also reports some upper abdominal bloating and frequent mild nausea. She denies any vomiting. She has a past abdominal surgical history of laparoscopic appendectomy and right nephrectomy performed approximately 15 years ago at [facility]. We do not have those records. Recently she underwent CT scan that showed what appears to be a right spigelian hernia. The hernia sac seems to contain part of her right colon as well as some small bowel. This is not a full-thickness hernia as the overlying external oblique muscle seems to be intact. The patient had a CT scan performed in December 2015. Retrospective review that scan shows that she had a very small spigelian hernia at that time. The hernia has enlarged significantly in the last 5 years.   CLINICAL DATA: Right lower quadrant abdominal pain. Evaluate abdominal wall hernia.  EXAM: CT ABDOMEN AND PELVIS WITH CONTRAST  TECHNIQUE: Multidetector CT imaging of the abdomen and pelvis was performed using the standard protocol following bolus administration of intravenous contrast.  CONTRAST: OMNIPAQUE IOHEXOL 300 MG/ML SOLN  COMPARISON: CT scan 09/12/2014  FINDINGS: Lower chest: The lung bases are clear of acute process. No pleural effusion or pulmonary lesions. The heart is normal in size. No pericardial effusion. The distal esophagus  and aorta are unremarkable.  Hepatobiliary: No focal hepatic lesions or intrahepatic biliary dilatation. The gallbladder appears normal. No common bile duct dilatation. The portal and hepatic veins are patent.  Pancreas: No mass, inflammation or ductal dilatation.  Spleen: Normal size. No focal lesions.  Adrenals/Urinary Tract: Adrenal glands and kidneys are unremarkable. No renal, ureteral or bladder calculi or mass.  Stomach/Bowel: The stomach, duodenum, small bowel and colon are unremarkable. No acute inflammatory changes, mass lesions or obstructive findings.  Vascular/Lymphatic: The aorta is normal in caliber. No dissection. The branch vessels are patent. The major venous structures are patent. No mesenteric or retroperitoneal mass or adenopathy. Small scattered lymph nodes are noted.  Reproductive: The uterus and left ovary are normal. The right ovary is surgically absent. Slightly prominent parametrial vessels.  Other: There is a spigelian type abdominal wall hernia on the right side. This was also present on the prior study but is much larger and now contains the cecum and terminal ileum. The hernia is between the rectus abdominus muscles and the oblique abdominal muscles and appears contained by the fascia of the external oblique muscle. No findings for obstruction or incarceration. The appendix is surgically absent.  Musculoskeletal: No significant bony findings.  IMPRESSION: 1. Moderate to large right-sided spigelian type low abdominal wall hernia containing the cecum and terminal ileum. This has enlarged since the prior CT scan. No CT findings for obstruction or incarceration. 2. No acute abdominal/pelvic findings, mass lesions or adenopathy.   Electronically Signed By: Rudie Meyer M.D. On: 09/16/2019 09:12   Problem List/Past Medical Molli Hazard K. Rockland Kotarski, MD; 10/11/2019 5:46 PM) Anda Kraft HERNIA (K43.9)  Past Surgical History Adelina Mings  Hardin Negus, CMA;  10/11/2019 3:15 PM) Colon Polyp Removal - Colonoscopy Colon Polyp Removal - Open Tonsillectomy  Diagnostic Studies History Emeline Gins, CMA; 10/11/2019 3:15 PM) Colonoscopy 1-5 years ago Mammogram 1-3 years ago  Allergies Emeline Gins, CMA; 10/11/2019 3:16 PM) Penicillins Allergies Reconciled  Medication History Emeline Gins, CMA; 10/11/2019 3:16 PM) Levothyroxine Sodium (150MCG Tablet, Oral) Active. DULoxetine HCl (30MG  Capsule DR Part, Oral) Active. Triamterene-HCTZ (37.5-25MG  Tablet, Oral) Active. Medications Reconciled  Social History Emeline Gins, [place]; 10/11/2019 3:15 PM) Caffeine use Coffee.  Family History Emeline Gins, [place]; 10/11/2019 3:15 PM) Arthritis Mother. Heart Disease Father. Hypertension Brother. Thyroid problems Mother.  Pregnancy / Birth History Emeline Gins, [place]; 10/11/2019 3:15 PM) Age at menarche 15 years. Age of menopause 62-60 Gravida 1 Maternal age 22-25 Para 3  Other Problems Imogene Burn. Darlinda Bellows, MD; 10/11/2019 5:46 PM) Anxiety Disorder Arthritis Oophorectomy Right.     Review of Systems Emeline Gins CMA; 10/11/2019 3:15 PM) HEENT Present- Hearing Loss and Seasonal Allergies. Not Present- Earache, Hoarseness, Nose Bleed, Oral Ulcers, Ringing in the Ears, Sinus Pain, Sore Throat, Visual Disturbances, Wears glasses/contact lenses and Yellow Eyes. Respiratory Not Present- Bloody sputum, Chronic Cough, Difficulty Breathing, Snoring and Wheezing. Breast Not Present- Breast Mass, Breast Pain, Nipple Discharge and Skin Changes. Cardiovascular Present- Swelling of Extremities. Not Present- Chest Pain, Difficulty Breathing Lying Down, Leg Cramps, Palpitations, Rapid Heart Rate and Shortness of Breath. Gastrointestinal Present- Change in Bowel Habits and Nausea. Not Present- Abdominal Pain, Bloating, Bloody Stool, Chronic diarrhea, Constipation, Difficulty Swallowing, Excessive gas, Gets full quickly at meals,  Hemorrhoids, Indigestion, Rectal Pain and Vomiting. Musculoskeletal Present- Joint Stiffness and Swelling of Extremities. Not Present- Back Pain, Joint Pain, Muscle Pain and Muscle Weakness. Neurological Present- Numbness and Tingling. Not Present- Decreased Memory, Fainting, Headaches, Seizures, Tremor, Trouble walking and Weakness. Endocrine Not Present- Cold Intolerance, Excessive Hunger, Hair Changes, Heat Intolerance, Hot flashes and New Diabetes.  Vitals Emeline Gins CMA; 10/11/2019 3:16 PM) 10/11/2019 3:16 PM Weight: 213.2 lb Height: 65in Body Surface Area: 2.03 m Body Mass Index: 35.48 kg/m  Temp.: 97.52F  Pulse: 76 (Regular)  BP: 150/88 (Sitting, Left Arm, Standard)        Physical Exam Rodman Key K. Zeth Buday MD; 10/11/2019 5:46 PM)  The physical exam findings are as follows: Note:WDWN in NAD Eyes: Pupils equal, round; sclera anicteric HENT: Oral mucosa moist; good dentition Neck: No masses palpated, no thyromegaly Lungs: CTA bilaterally; normal respiratory effort CV: Regular rate and rhythm; no murmurs; extremities well-perfused with some mild edema Abd: +bowel sounds, soft, non-tender, no palpable organomegaly;firmness/ fullness in RLQ - not reducible; Skin: Warm, dry; no sign of jaundice Psychiatric - alert and oriented x 4; calm mood and affect    Assessment & Plan Rodman Key K. 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 The surgical procedure has been discussed with the patient. Potential risks, benefits, alternative treatments, and expected outcomes have been explained. All of the patient's questions at this time have been answered. The likelihood of reaching the patient's treatment goal is good. The patient understand the proposed surgical procedure and wishes to proceed.  Surgery will be delayed due to COVID restrictions.  Note:I spent about  25 minutes of this 30 minute appointment examining the patient and discussing her situation with the patient and her daughter. They had very many questions which I answered to the best of my ability. At this time, [place] is beginning  to restrict elective surgical procedures that need to be performed at an inpatient facility. This type of hernia repair is not safely performed at an outpatient setting with immediate discharge. The patient will likely require 1-2 days of admission after surgery. I explained to the patient and her daughter that her case may be delayed for a couple of months. However if her condition changes and she becomes more obstructed with more frequent nausea and vomiting and decrease in bowel movements, we will move her to a priority one case and will proceed with hernia repair. They have agreed to notify me of any change in her status.   Wilmon Arms. Corliss Skains, MD, [facility]  General/ Trauma Surgery   10/11/2019 5:49 PM

## 2019-11-02 NOTE — Patient Instructions (Signed)
DUE TO COVID-19 ONLY ONE VISITOR IS ALLOWED TO COME WITH YOU AND STAY IN THE WAITING ROOM ONLY DURING PRE OP AND PROCEDURE DAY OF SURGERY. THE 1 VISITOR MAY VISIT WITH YOU AFTER SURGERY IN YOUR PRIVATE ROOM DURING VISITING HOURS ONLY!  YOU NEED TO HAVE A COVID 19 TEST ON__2/5/21_____ @_8 :55______, THIS TEST MUST BE DONE BEFORE SURGERY, COME  [address], [place] , .  [facility]) ONCE YOUR COVID TEST IS COMPLETED, PLEASE BEGIN THE QUARANTINE INSTRUCTIONS AS OUTLINED IN YOUR HANDOUT.                BRITTANE GRUDZINSKI    Your procedure is scheduled on: 11/08/19   Report to [facility] Main  Entrance   Report to admitting at  5:45 AM     Call this number if you have problems the morning of surgery [phone]   . BRUSH YOUR TEETH MORNING OF SURGERY AND RINSE YOUR MOUTH OUT, NO CHEWING GUM CANDY OR MINTS.   Do not eat food After Midnight  . YOU MAY HAVE CLEAR LIQUIDS FROM MIDNIGHT UNTIL 4:45 AM.   At 4:45 AM Please finish the prescribed Pre-Surgery  drink.   Nothing by mouth after you finish the  drink !   Take these medicines the morning of surgery with A SIP OF WATER: Cymbalta, Levothyroxine. Use your eye drops                               You may not have any metal on your body including hair pins and              piercings  Do not wear jewelry, make-up, lotions, powders or perfumes, deodorant             Do not wear nail polish on your fingernails.  Do not shave  48 hours prior to surgery.        Do not bring valuables to the hospital. [facility] IS NOT             RESPONSIBLE   FOR VALUABLES.  Contacts, dentures or bridgework may not be worn into surgery.  .     Patients discharged the day of surgery will not be allowed to drive home.  IF YOU ARE HAVING SURGERY AND GOING HOME THE SAME DAY, YOU MUST HAVE AN ADULT TO DRIVE YOU HOME AND BE WITH YOU FOR 24 HOURS . YOU MAY GO HOME BY TAXI OR UBER OR ORTHERWISE, BUT AN ADULT MUST  ACCOMPANY YOU HOME AND STAY WITH YOU FOR 24 HOURS.  Name and phone number of your driver:  Special Instructions: N/A              Please read over the following fact sheets you were given: _____________________________________________________________________             [facility] - Preparing for Surgery  Before surgery, you can play an important role.   Because skin is not sterile, your skin needs to be as free of germs as possible.   You can reduce the number of germs on your skin by washing with CHG (chlorahexidine gluconate) soap before surgery.   CHG is an antiseptic cleaner which kills germs and bonds with the skin to continue killing germs even after washing. Please DO NOT use if you have an allergy to CHG or antibacterial soaps.   If your skin becomes reddened/irritated  stop using the CHG and inform your nurse when you arrive at Short Stay. Do not shave (including legs and underarms) for at least 48 hours prior to the first CHG shower.   Please follow these instructions carefully:  1.  Shower with CHG Soap the night before surgery and the  morning of Surgery.  2.  If you choose to wash your hair, wash your hair first as usual with your  normal  shampoo.  3.  After you shampoo, rinse your hair and body thoroughly to remove the  shampoo.                                        4.  Use CHG as you would any other liquid soap.  You can apply chg directly  to the skin and wash                       Gently with a scrungie or clean washcloth.  5.  Apply the CHG Soap to your body ONLY FROM THE NECK DOWN.   Do not use on face/ open                           Wound or open sores. Avoid contact with eyes, ears mouth and genitals (private parts).                       Wash face,  Genitals (private parts) with your normal soap.             6.  Wash thoroughly, paying special attention to the area where your surgery  will be performed.  7.  Thoroughly rinse your body with warm water from the  neck down.  8.  DO NOT shower/wash with your normal soap after using and rinsing off  the CHG Soap.             9.  Pat yourself dry with a clean towel.            10.  Wear clean pajamas.            11.  Place clean sheets on your bed the night of your first shower and do not  sleep with pets. Day of Surgery : Do not apply any lotions/deodorants the morning of surgery.  Please wear clean clothes to the hospital/surgery center.  FAILURE TO FOLLOW THESE INSTRUCTIONS MAY RESULT IN THE CANCELLATION OF YOUR SURGERY PATIENT SIGNATURE_________________________________  NURSE SIGNATURE__________________________________  ________________________________________________________________________

## 2019-11-03 ENCOUNTER — Encounter (HOSPITAL_COMMUNITY)
Admission: RE | Admit: 2019-11-03 | Discharge: 2019-11-03 | Disposition: A | Discharge status: Home / Self Care | Payer: Medicare PPO | Source: Ambulatory Visit | Attending: Surgery | Admitting: Surgery

## 2019-11-03 ENCOUNTER — Encounter (HOSPITAL_COMMUNITY): Payer: Self-pay

## 2019-11-03 ENCOUNTER — Other Ambulatory Visit: Payer: Self-pay

## 2019-11-03 DIAGNOSIS — Z01812 Encounter for preprocedural laboratory examination: Secondary | ICD-10-CM | POA: Diagnosis not present

## 2019-11-03 HISTORY — DX: Cardiac arrhythmia, unspecified: I49.9

## 2019-11-03 LAB — CBC
HCT: 43.7 % (ref 36.0–46.0)
Hemoglobin: 14.2 g/dL (ref 12.0–15.0)
MCH: 28.4 pg (ref 26.0–34.0)
MCHC: 32.5 g/dL (ref 30.0–36.0)
MCV: 87.4 fL (ref 80.0–100.0)
Platelets: 345 10*3/uL (ref 150–400)
RBC: 5 MIL/uL (ref 3.87–5.11)
RDW: 14.1 % (ref 11.5–15.5)
WBC: 7 10*3/uL (ref 4.0–10.5)
nRBC: 0 % (ref 0.0–0.2)

## 2019-11-03 NOTE — Progress Notes (Signed)
PCP - Dr. Rodman Key Cardiologist - Dr. Ranae Palms  Chest x-ray - 2015 EKG - 2019 Stress Test - no ECHO -2019  Cardiac Cath - no  Sleep Study - no CPAP -   Fasting Blood Sugar - NA Checks Blood Sugar _____ times a day  Blood Thinner Instructions:NA Aspirin Instructions: Last Dose:  Anesthesia review:   Patient denies shortness of breath, fever, cough and chest pain at PAT appointment yes  Patient verbalized understanding of instructions that were given to them at the PAT appointment. Patient was also instructed that they will need to review over the PAT instructions again at home before surgery. yes

## 2019-11-04 ENCOUNTER — Other Ambulatory Visit (HOSPITAL_COMMUNITY)
Admission: RE | Admit: 2019-11-04 | Discharge: 2019-11-04 | Disposition: A | Discharge status: Home / Self Care | Payer: Medicare PPO | Source: Ambulatory Visit | Attending: Surgery | Admitting: Surgery

## 2019-11-04 DIAGNOSIS — Z20822 Contact with and (suspected) exposure to covid-19: Secondary | ICD-10-CM | POA: Insufficient documentation

## 2019-11-04 DIAGNOSIS — Z01812 Encounter for preprocedural laboratory examination: Secondary | ICD-10-CM | POA: Insufficient documentation

## 2019-11-04 LAB — SARS CORONAVIRUS 2 (TAT 6-24 HRS): SARS Coronavirus 2: NEGATIVE

## 2019-11-07 NOTE — Anesthesia Preprocedure Evaluation (Addendum)
Anesthesia Evaluation  Patient identified by MRN, date of birth, ID band Patient awake    Reviewed: Allergy & Precautions, NPO status , Patient's Chart, lab work & pertinent test results  History of Anesthesia Complications Negative for: history of anesthetic complications  Airway Mallampati: I  TM Distance: >3 FB Neck ROM: Full    Dental  (+) Teeth Intact   Pulmonary neg pulmonary ROS, former smoker,    Pulmonary exam normal        Cardiovascular negative cardio ROS Normal cardiovascular exam     Neuro/Psych PSYCHIATRIC DISORDERS Anxiety Depression Meniere's disease    GI/Hepatic negative GI ROS, Neg liver ROS,   Endo/Other  Hypothyroidism   Renal/GU negative Renal ROS  negative genitourinary   Musculoskeletal  (+) Arthritis ,   Abdominal   Peds  Hematology negative hematology ROS (+)   Anesthesia Other Findings Echo 11/18/17: EF 60-65%.No regional wall motion normality.GR 1 DD.Mild aortic stenosis (mean gradient 9 mmHg). Mild LA dilation.  Reproductive/Obstetrics                            Anesthesia Physical Anesthesia Plan  ASA: II  Anesthesia Plan: General   Post-op Pain Management:    Induction: Intravenous  PONV Risk Score and Plan: 4 or greater and Ondansetron, Dexamethasone, Treatment may vary due to age or medical condition and Midazolam  Airway Management Planned: Oral ETT  Additional Equipment: None  Intra-op Plan:   Post-operative Plan: Extubation in OR  Informed Consent: I have reviewed the patients History and Physical, chart, labs and discussed the procedure including the risks, benefits and alternatives for the proposed anesthesia with the patient or authorized representative who has indicated his/her understanding and acceptance.     Dental advisory given  Plan Discussed with:   Anesthesia Plan Comments: (Pt described a single episode of new chest  pain with dyspnea last week which occurred while sitting. She does have panic attacks and said that this felt very similar to her panic attacks except that it was not prompted by anything. She has been deconditioned during the pandemic and tolerates less activity than she used to but has been working with a trainer to increase her fitness and never gets chest pain while exercising. She can climb a flight of stairs without stopping. A 12-lead EKG was obtained and is not significantly changed from 2 years ago. I was unable to auscultate a murmur. Discussed extensively with the patient that I think her chest pain is very unlikely to be cardiac based on her description, and that considering her aortic stenosis was very mild two years ago, it would be very unlikely to have progressed to the point of requiring intervention prior to surgery by now. We agreed it would be safe to proceed with surgery today, and she will follow up with her cardiologist as scheduled in March.)       Anesthesia Quick Evaluation

## 2019-11-08 ENCOUNTER — Ambulatory Visit (HOSPITAL_COMMUNITY): Payer: Medicare PPO | Admitting: Physician Assistant

## 2019-11-08 ENCOUNTER — Encounter (HOSPITAL_COMMUNITY): Payer: Self-pay | Admitting: Surgery

## 2019-11-08 ENCOUNTER — Other Ambulatory Visit: Payer: Self-pay

## 2019-11-08 ENCOUNTER — Encounter (HOSPITAL_COMMUNITY)
Admission: RE | Disposition: A | Discharge status: Home / Self Care | Payer: Self-pay | Source: Home / Self Care | Attending: Surgery

## 2019-11-08 ENCOUNTER — Observation Stay (HOSPITAL_COMMUNITY)
Admission: RE | Admit: 2019-11-08 | Discharge: 2019-11-09 | Disposition: A | Discharge status: Home / Self Care | Payer: Medicare PPO | Attending: Surgery | Admitting: Surgery

## 2019-11-08 ENCOUNTER — Ambulatory Visit (HOSPITAL_COMMUNITY): Payer: Medicare PPO | Admitting: Anesthesiology

## 2019-11-08 DIAGNOSIS — Z87891 Personal history of nicotine dependence: Secondary | ICD-10-CM | POA: Insufficient documentation

## 2019-11-08 DIAGNOSIS — E039 Hypothyroidism, unspecified: Secondary | ICD-10-CM | POA: Insufficient documentation

## 2019-11-08 DIAGNOSIS — F329 Major depressive disorder, single episode, unspecified: Secondary | ICD-10-CM | POA: Insufficient documentation

## 2019-11-08 DIAGNOSIS — F419 Anxiety disorder, unspecified: Secondary | ICD-10-CM | POA: Insufficient documentation

## 2019-11-08 DIAGNOSIS — Z880 Allergy status to penicillin: Secondary | ICD-10-CM | POA: Diagnosis not present

## 2019-11-08 DIAGNOSIS — H8109 Meniere's disease, unspecified ear: Secondary | ICD-10-CM | POA: Insufficient documentation

## 2019-11-08 DIAGNOSIS — K439 Ventral hernia without obstruction or gangrene: Principal | ICD-10-CM | POA: Diagnosis present

## 2019-11-08 DIAGNOSIS — I350 Nonrheumatic aortic (valve) stenosis: Secondary | ICD-10-CM | POA: Insufficient documentation

## 2019-11-08 DIAGNOSIS — Z86010 Personal history of colonic polyps: Secondary | ICD-10-CM | POA: Insufficient documentation

## 2019-11-08 DIAGNOSIS — Z8349 Family history of other endocrine, nutritional and metabolic diseases: Secondary | ICD-10-CM | POA: Diagnosis not present

## 2019-11-08 DIAGNOSIS — Z882 Allergy status to sulfonamides status: Secondary | ICD-10-CM | POA: Insufficient documentation

## 2019-11-08 DIAGNOSIS — M1990 Unspecified osteoarthritis, unspecified site: Secondary | ICD-10-CM | POA: Diagnosis not present

## 2019-11-08 DIAGNOSIS — Z8261 Family history of arthritis: Secondary | ICD-10-CM | POA: Diagnosis not present

## 2019-11-08 DIAGNOSIS — I4510 Unspecified right bundle-branch block: Secondary | ICD-10-CM | POA: Diagnosis not present

## 2019-11-08 DIAGNOSIS — H9190 Unspecified hearing loss, unspecified ear: Secondary | ICD-10-CM | POA: Insufficient documentation

## 2019-11-08 DIAGNOSIS — E7800 Pure hypercholesterolemia, unspecified: Secondary | ICD-10-CM | POA: Diagnosis not present

## 2019-11-08 DIAGNOSIS — R339 Retention of urine, unspecified: Secondary | ICD-10-CM | POA: Diagnosis not present

## 2019-11-08 DIAGNOSIS — Z8249 Family history of ischemic heart disease and other diseases of the circulatory system: Secondary | ICD-10-CM | POA: Insufficient documentation

## 2019-11-08 DIAGNOSIS — Z79899 Other long term (current) drug therapy: Secondary | ICD-10-CM | POA: Diagnosis not present

## 2019-11-08 DIAGNOSIS — Z905 Acquired absence of kidney: Secondary | ICD-10-CM | POA: Diagnosis not present

## 2019-11-08 HISTORY — PX: LAPAROSCOPIC ASSISTED SPIGELIAN HERNIA REPAIR: SHX6763

## 2019-11-08 LAB — CBC
HCT: 42.9 % (ref 36.0–46.0)
Hemoglobin: 13.6 g/dL (ref 12.0–15.0)
MCH: 27.9 pg (ref 26.0–34.0)
MCHC: 31.7 g/dL (ref 30.0–36.0)
MCV: 87.9 fL (ref 80.0–100.0)
Platelets: 312 10*3/uL (ref 150–400)
RBC: 4.88 MIL/uL (ref 3.87–5.11)
RDW: 13.9 % (ref 11.5–15.5)
WBC: 8.8 10*3/uL (ref 4.0–10.5)
nRBC: 0 % (ref 0.0–0.2)

## 2019-11-08 LAB — CREATININE, SERUM
Creatinine, Ser: 0.85 mg/dL (ref 0.44–1.00)
GFR calc Af Amer: >60 mL/min (ref >60)
GFR calc non Af Amer: >60 mL/min (ref >60)

## 2019-11-08 SURGERY — REPAIR, HERNIA, SPIGELIAN, LAPAROSCOPIC
Anesthesia: General | Site: Abdomen

## 2019-11-08 MED ORDER — BUPIVACAINE HCL (PF) 0.25 % IJ SOLN
INTRAMUSCULAR | Status: DC | PRN
  Administered 2019-11-08: 10 mL

## 2019-11-08 MED ORDER — OXYCODONE HCL 5 MG PO TABS
ORAL_TABLET | ORAL | Status: AC
  Administered 2019-11-08: 5 mg via ORAL
  Filled 2019-11-08: qty 1

## 2019-11-08 MED ORDER — LEVOTHYROXINE SODIUM 75 MCG PO TABS
150.0000 ug | ORAL_TABLET | Freq: Every day | ORAL | Status: DC
  Administered 2019-11-09: 150 ug via ORAL
  Filled 2019-11-08: qty 2

## 2019-11-08 MED ORDER — ENOXAPARIN SODIUM 40 MG/0.4ML ~~LOC~~ SOLN
40.0000 mg | SUBCUTANEOUS | Status: DC
  Administered 2019-11-09: 40 mg via SUBCUTANEOUS
  Filled 2019-11-08 (×2): qty 0.4

## 2019-11-08 MED ORDER — METHOCARBAMOL 500 MG PO TABS
500.0000 mg | ORAL_TABLET | Freq: Four times a day (QID) | ORAL | Status: DC | PRN
  Administered 2019-11-09: 500 mg via ORAL
  Filled 2019-11-08: qty 1

## 2019-11-08 MED ORDER — LIDOCAINE 2% (20 MG/ML) 5 ML SYRINGE
INTRAMUSCULAR | Status: DC | PRN
  Administered 2019-11-08: 1 mg/kg/h via INTRAVENOUS

## 2019-11-08 MED ORDER — EPHEDRINE SULFATE-NACL 50-0.9 MG/10ML-% IV SOSY
PREFILLED_SYRINGE | INTRAVENOUS | Status: DC | PRN
  Administered 2019-11-08 (×5): 10 mg via INTRAVENOUS

## 2019-11-08 MED ORDER — KETAMINE HCL 10 MG/ML IJ SOLN
INTRAMUSCULAR | Status: AC
  Filled 2019-11-08: qty 1

## 2019-11-08 MED ORDER — ROCURONIUM BROMIDE 50 MG/5ML IV SOSY
PREFILLED_SYRINGE | INTRAVENOUS | Status: DC | PRN
  Administered 2019-11-08: 50 mg via INTRAVENOUS
  Administered 2019-11-08: 20 mg via INTRAVENOUS

## 2019-11-08 MED ORDER — FENTANYL CITRATE (PF) 250 MCG/5ML IJ SOLN
INTRAMUSCULAR | Status: AC
  Filled 2019-11-08: qty 5

## 2019-11-08 MED ORDER — ACETAMINOPHEN 650 MG RE SUPP: 650.0000 mg | Freq: Four times a day (QID) | RECTAL | Status: DC | PRN

## 2019-11-08 MED ORDER — BUPIVACAINE HCL (PF) 0.25 % IJ SOLN
INTRAMUSCULAR | Status: AC
  Filled 2019-11-08: qty 30

## 2019-11-08 MED ORDER — PROPOFOL 10 MG/ML IV BOLUS
INTRAVENOUS | Status: AC
  Filled 2019-11-08: qty 20

## 2019-11-08 MED ORDER — SUGAMMADEX SODIUM 200 MG/2ML IV SOLN
INTRAVENOUS | Status: DC | PRN
  Administered 2019-11-08: 200 mg via INTRAVENOUS

## 2019-11-08 MED ORDER — DIPHENHYDRAMINE HCL 50 MG/ML IJ SOLN: 12.5000 mg | Freq: Four times a day (QID) | INTRAMUSCULAR | Status: DC | PRN

## 2019-11-08 MED ORDER — TRIAMTERENE-HCTZ 37.5-25 MG PO TABS
1.0000 | ORAL_TABLET | Freq: Every day | ORAL | Status: DC
  Administered 2019-11-09: 1 via ORAL
  Filled 2019-11-08: qty 1

## 2019-11-08 MED ORDER — ONDANSETRON HCL 4 MG/2ML IJ SOLN
4.0000 mg | Freq: Four times a day (QID) | INTRAMUSCULAR | Status: DC | PRN
  Administered 2019-11-08 – 2019-11-09 (×2): 4 mg via INTRAVENOUS
  Filled 2019-11-08 (×2): qty 2

## 2019-11-08 MED ORDER — ENSURE PRE-SURGERY PO LIQD
592.0000 mL | Freq: Once | ORAL | Status: DC
  Filled 2019-11-08: qty 592

## 2019-11-08 MED ORDER — FENTANYL CITRATE (PF) 100 MCG/2ML IJ SOLN
25.0000 ug | INTRAMUSCULAR | Status: DC | PRN
  Administered 2019-11-08: 50 ug via INTRAVENOUS

## 2019-11-08 MED ORDER — CHLORHEXIDINE GLUCONATE CLOTH 2 % EX PADS: 6.0000 | MEDICATED_PAD | Freq: Once | CUTANEOUS | Status: DC

## 2019-11-08 MED ORDER — DEXAMETHASONE SODIUM PHOSPHATE 4 MG/ML IJ SOLN: 4.0000 mg | INTRAMUSCULAR | Status: DC

## 2019-11-08 MED ORDER — OXYCODONE HCL 5 MG PO TABS: 5.0000 mg | ORAL_TABLET | Freq: Once | ORAL | Status: DC | PRN

## 2019-11-08 MED ORDER — LIDOCAINE 2% (20 MG/ML) 5 ML SYRINGE
INTRAMUSCULAR | Status: DC | PRN
  Administered 2019-11-08: 60 mg via INTRAVENOUS

## 2019-11-08 MED ORDER — KETOROLAC TROMETHAMINE 30 MG/ML IJ SOLN
INTRAMUSCULAR | Status: DC | PRN
  Administered 2019-11-08: 30 mg via INTRAVENOUS

## 2019-11-08 MED ORDER — GABAPENTIN 300 MG PO CAPS
300.0000 mg | ORAL_CAPSULE | Freq: Two times a day (BID) | ORAL | Status: DC
  Administered 2019-11-08 – 2019-11-09 (×3): 300 mg via ORAL
  Filled 2019-11-08 (×3): qty 1

## 2019-11-08 MED ORDER — ONDANSETRON HCL 4 MG/2ML IJ SOLN
4.0000 mg | Freq: Once | INTRAMUSCULAR | Status: AC | PRN
  Administered 2019-11-08: 4 mg via INTRAVENOUS

## 2019-11-08 MED ORDER — LACTATED RINGERS IV SOLN: INTRAVENOUS | Status: DC

## 2019-11-08 MED ORDER — LIDOCAINE 2% (20 MG/ML) 5 ML SYRINGE
INTRAMUSCULAR | Status: AC
  Filled 2019-11-08: qty 5

## 2019-11-08 MED ORDER — KETAMINE HCL 10 MG/ML IJ SOLN
INTRAMUSCULAR | Status: DC | PRN
  Administered 2019-11-08: 30 mg via INTRAVENOUS

## 2019-11-08 MED ORDER — GLYCOPYRROLATE PF 0.2 MG/ML IJ SOSY
PREFILLED_SYRINGE | INTRAMUSCULAR | Status: DC | PRN
  Administered 2019-11-08: .2 mg via INTRAVENOUS

## 2019-11-08 MED ORDER — ENSURE PRE-SURGERY PO LIQD
296.0000 mL | Freq: Once | ORAL | Status: DC
  Filled 2019-11-08: qty 296

## 2019-11-08 MED ORDER — DIPHENHYDRAMINE HCL 12.5 MG/5ML PO ELIX: 12.5000 mg | ORAL_SOLUTION | Freq: Four times a day (QID) | ORAL | Status: DC | PRN

## 2019-11-08 MED ORDER — ONDANSETRON HCL 4 MG/2ML IJ SOLN
INTRAMUSCULAR | Status: DC | PRN
  Administered 2019-11-08: 4 mg via INTRAVENOUS

## 2019-11-08 MED ORDER — ONDANSETRON HCL 4 MG/2ML IJ SOLN
INTRAMUSCULAR | Status: AC
  Filled 2019-11-08: qty 2

## 2019-11-08 MED ORDER — DOCUSATE SODIUM 100 MG PO CAPS
100.0000 mg | ORAL_CAPSULE | Freq: Two times a day (BID) | ORAL | Status: DC
  Administered 2019-11-08 – 2019-11-09 (×2): 100 mg via ORAL
  Filled 2019-11-08 (×2): qty 1

## 2019-11-08 MED ORDER — POLYVINYL ALCOHOL 1.4 % OP SOLN
1.0000 [drp] | Freq: Three times a day (TID) | OPHTHALMIC | Status: DC | PRN
  Filled 2019-11-08: qty 15

## 2019-11-08 MED ORDER — ACETAMINOPHEN 325 MG PO TABS: 650.0000 mg | ORAL_TABLET | Freq: Four times a day (QID) | ORAL | Status: DC | PRN

## 2019-11-08 MED ORDER — EPHEDRINE 5 MG/ML INJ
INTRAVENOUS | Status: AC
  Filled 2019-11-08: qty 10

## 2019-11-08 MED ORDER — MORPHINE SULFATE (PF) 2 MG/ML IV SOLN: 2.0000 mg | INTRAVENOUS | Status: DC | PRN

## 2019-11-08 MED ORDER — PROPOFOL 10 MG/ML IV BOLUS
INTRAVENOUS | Status: DC | PRN
  Administered 2019-11-08: 130 mg via INTRAVENOUS

## 2019-11-08 MED ORDER — GABAPENTIN 300 MG PO CAPS
300.0000 mg | ORAL_CAPSULE | ORAL | Status: AC
  Administered 2019-11-08: 300 mg via ORAL
  Filled 2019-11-08: qty 1

## 2019-11-08 MED ORDER — PHENYLEPHRINE HCL (PRESSORS) 10 MG/ML IV SOLN
INTRAVENOUS | Status: AC
  Filled 2019-11-08: qty 1

## 2019-11-08 MED ORDER — DULOXETINE HCL 30 MG PO CPEP
30.0000 mg | ORAL_CAPSULE | Freq: Every day | ORAL | Status: DC
  Administered 2019-11-09: 30 mg via ORAL
  Filled 2019-11-08: qty 1

## 2019-11-08 MED ORDER — DEXAMETHASONE SODIUM PHOSPHATE 10 MG/ML IJ SOLN
INTRAMUSCULAR | Status: AC
  Filled 2019-11-08: qty 1

## 2019-11-08 MED ORDER — ONDANSETRON 4 MG PO TBDP: 4.0000 mg | ORAL_TABLET | Freq: Four times a day (QID) | ORAL | Status: DC | PRN

## 2019-11-08 MED ORDER — DEXAMETHASONE SODIUM PHOSPHATE 10 MG/ML IJ SOLN
INTRAMUSCULAR | Status: DC | PRN
  Administered 2019-11-08: 6 mg via INTRAVENOUS

## 2019-11-08 MED ORDER — OXYCODONE HCL 5 MG PO TABS
5.0000 mg | ORAL_TABLET | ORAL | Status: DC | PRN
  Administered 2019-11-08: 5 mg via ORAL
  Filled 2019-11-08: qty 1

## 2019-11-08 MED ORDER — GLYCOPYRROLATE PF 0.2 MG/ML IJ SOSY
PREFILLED_SYRINGE | INTRAMUSCULAR | Status: AC
  Filled 2019-11-08: qty 1

## 2019-11-08 MED ORDER — SUCCINYLCHOLINE CHLORIDE 200 MG/10ML IV SOSY
PREFILLED_SYRINGE | INTRAVENOUS | Status: AC
  Filled 2019-11-08: qty 10

## 2019-11-08 MED ORDER — OXYCODONE HCL 5 MG/5ML PO SOLN: 5.0000 mg | Freq: Once | ORAL | Status: DC | PRN

## 2019-11-08 MED ORDER — KETOROLAC TROMETHAMINE 15 MG/ML IJ SOLN
15.0000 mg | Freq: Four times a day (QID) | INTRAMUSCULAR | Status: DC
  Administered 2019-11-08 – 2019-11-09 (×4): 15 mg via INTRAVENOUS
  Filled 2019-11-08 (×4): qty 1

## 2019-11-08 MED ORDER — ROCURONIUM BROMIDE 10 MG/ML (PF) SYRINGE
PREFILLED_SYRINGE | INTRAVENOUS | Status: AC
  Filled 2019-11-08: qty 10

## 2019-11-08 MED ORDER — FENTANYL CITRATE (PF) 100 MCG/2ML IJ SOLN
INTRAMUSCULAR | Status: AC
  Administered 2019-11-08: 50 ug via INTRAVENOUS
  Filled 2019-11-08: qty 4

## 2019-11-08 MED ORDER — CEFAZOLIN SODIUM-DEXTROSE 2-4 GM/100ML-% IV SOLN
2.0000 g | INTRAVENOUS | Status: AC
  Administered 2019-11-08: 2 g via INTRAVENOUS
  Filled 2019-11-08: qty 100

## 2019-11-08 MED ORDER — SIMETHICONE 80 MG PO CHEW
40.0000 mg | CHEWABLE_TABLET | Freq: Four times a day (QID) | ORAL | Status: DC | PRN
  Administered 2019-11-09: 40 mg via ORAL
  Filled 2019-11-08: qty 1

## 2019-11-08 MED ORDER — LIDOCAINE HCL 2 % IJ SOLN
INTRAMUSCULAR | Status: AC
  Filled 2019-11-08: qty 20

## 2019-11-08 MED ORDER — PHENYLEPHRINE HCL-NACL 10-0.9 MG/250ML-% IV SOLN
INTRAVENOUS | Status: DC | PRN
  Administered 2019-11-08: 35 ug/min via INTRAVENOUS

## 2019-11-08 MED ORDER — FENTANYL CITRATE (PF) 250 MCG/5ML IJ SOLN
INTRAMUSCULAR | Status: DC | PRN
  Administered 2019-11-08 (×3): 50 ug via INTRAVENOUS

## 2019-11-08 MED ORDER — KETOROLAC TROMETHAMINE 15 MG/ML IJ SOLN
INTRAMUSCULAR | Status: AC
  Administered 2019-11-08: 15 mg via INTRAVENOUS
  Filled 2019-11-08: qty 1

## 2019-11-08 MED ORDER — KETOROLAC TROMETHAMINE 30 MG/ML IJ SOLN
INTRAMUSCULAR | Status: AC
  Filled 2019-11-08: qty 1

## 2019-11-08 MED ORDER — ACETAMINOPHEN 500 MG PO TABS
1000.0000 mg | ORAL_TABLET | ORAL | Status: AC
  Administered 2019-11-08: 1000 mg via ORAL
  Filled 2019-11-08: qty 2

## 2019-11-08 MED ORDER — 0.9 % SODIUM CHLORIDE (POUR BTL) OPTIME
TOPICAL | Status: DC | PRN
  Administered 2019-11-08: 08:00:00 1000 mL

## 2019-11-08 MED ORDER — TRAMADOL HCL 50 MG PO TABS: 50.0000 mg | ORAL_TABLET | Freq: Four times a day (QID) | ORAL | Status: DC | PRN

## 2019-11-08 SURGICAL SUPPLY — 46 items
ADH SKN CLS APL DERMABOND .7 (GAUZE/BANDAGES/DRESSINGS) ×1
APL PRP STRL LF DISP 70% ISPRP (MISCELLANEOUS) ×1
APL SKNCLS STERI-STRIP NONHPOA (GAUZE/BANDAGES/DRESSINGS)
BENZOIN TINCTURE PRP APPL 2/3 (GAUZE/BANDAGES/DRESSINGS) IMPLANT
BINDER ABDOMINAL 12 ML 46-62 (SOFTGOODS) ×1 IMPLANT
CABLE HIGH FREQUENCY MONO STRZ (ELECTRODE) ×1 IMPLANT
CHLORAPREP W/TINT 26 (MISCELLANEOUS) ×2 IMPLANT
COVER SURGICAL LIGHT HANDLE (MISCELLANEOUS) ×2 IMPLANT
DECANTER SPIKE VIAL GLASS SM (MISCELLANEOUS) ×2 IMPLANT
DERMABOND ADVANCED (GAUZE/BANDAGES/DRESSINGS) ×1
DERMABOND ADVANCED .7 DNX12 (GAUZE/BANDAGES/DRESSINGS) IMPLANT
DEVICE SECURE STRAP 25 ABSORB (INSTRUMENTS) ×1 IMPLANT
DEVICE TROCAR PUNCTURE CLOSURE (ENDOMECHANICALS) ×2 IMPLANT
DRAIN CHANNEL 19F RND (DRAIN) IMPLANT
DRAPE UTILITY XL STRL (DRAPES) ×2 IMPLANT
ELECT REM PT RETURN 15FT ADLT (MISCELLANEOUS) ×2 IMPLANT
EVACUATOR SILICONE 100CC (DRAIN) IMPLANT
GLOVE BIO SURGEON STRL SZ7 (GLOVE) ×2 IMPLANT
GLOVE BIOGEL PI IND STRL 7.5 (GLOVE) ×1 IMPLANT
GLOVE BIOGEL PI INDICATOR 7.5 (GLOVE) ×1
GOWN STRL REUS W/TWL XL LVL3 (GOWN DISPOSABLE) ×6 IMPLANT
KIT BASIN OR (CUSTOM PROCEDURE TRAY) ×2 IMPLANT
MARKER SKIN DUAL TIP RULER LAB (MISCELLANEOUS) ×2 IMPLANT
MESH VENTRALIGHT ST 4.5IN (Mesh General) ×1 IMPLANT
NDL SPNL 22GX3.5 QUINCKE BK (NEEDLE) ×1 IMPLANT
NEEDLE SPNL 22GX3.5 QUINCKE BK (NEEDLE) ×2 IMPLANT
PENCIL SMOKE EVACUATOR (MISCELLANEOUS) IMPLANT
SCISSORS LAP 5X35 DISP (ENDOMECHANICALS) ×2 IMPLANT
SET TUBE SMOKE EVAC HIGH FLOW (TUBING) ×2 IMPLANT
SHEARS HARMONIC ACE PLUS 36CM (ENDOMECHANICALS) IMPLANT
SLEEVE XCEL OPT CAN 5 100 (ENDOMECHANICALS) ×3 IMPLANT
SOL ANTI FOG 6CC (MISCELLANEOUS) ×1 IMPLANT
SOLUTION ANTI FOG 6CC (MISCELLANEOUS) ×1
STAPLER VISISTAT 35W (STAPLE) IMPLANT
STRIP CLOSURE SKIN 1/2X4 (GAUZE/BANDAGES/DRESSINGS) IMPLANT
SUT MNCRL AB 4-0 PS2 18 (SUTURE) ×2 IMPLANT
SUT NOVA NAB DX-16 0-1 5-0 T12 (SUTURE) IMPLANT
SUT NOVA NAB GS-21 0 18 T12 DT (SUTURE) ×1 IMPLANT
SUT PROLENE 0 CT 1 CR/8 (SUTURE) IMPLANT
SUT VICRYL 0 TIES 12 18 (SUTURE) IMPLANT
TOWEL OR 17X26 10 PK STRL BLUE (TOWEL DISPOSABLE) ×2 IMPLANT
TOWEL OR NON WOVEN STRL DISP B (DISPOSABLE) ×2 IMPLANT
TRAY LAPAROSCOPIC (CUSTOM PROCEDURE TRAY) ×2 IMPLANT
TROCAR BLADELESS OPT 5 100 (ENDOMECHANICALS) ×2 IMPLANT
TROCAR XCEL 12X100 BLDLESS (ENDOMECHANICALS) IMPLANT
TROCAR XCEL NON-BLD 11X100MML (ENDOMECHANICALS) ×2 IMPLANT

## 2019-11-08 NOTE — Progress Notes (Signed)
Assisted patient to restroom. Unable to void. States she has the urge but cannot urinate. Pt became nauseated after ambulating. Zofran given IVP. Straight cath performed at this time per orders with immediate return of 700 ml clear, yellow colored urine. Tolerated well. Instructed on IS use after cath procedure. Pt able to achieve 1250 ml x 5. Instructed to call for needs. Daughter at bedside.

## 2019-11-08 NOTE — Anesthesia Procedure Notes (Signed)
Procedure Name: Intubation Date/Time: 11/08/2019 7:43 AM Performed by: Maxwell Caul, CRNA Pre-anesthesia Checklist: Patient identified, Emergency Drugs available, Suction available and Patient being monitored Patient Re-evaluated:Patient Re-evaluated prior to induction Oxygen Delivery Method: Circle system utilized Preoxygenation: Pre-oxygenation with 100% oxygen Induction Type: IV induction Ventilation: Mask ventilation without difficulty Laryngoscope Size: Mac and 4 Grade View: Grade III Tube type: Oral Tube size: 7.5 mm Number of attempts: 1 Airway Equipment and Method: Stylet Placement Confirmation: ETT inserted through vocal cords under direct vision,  positive ETCO2 and breath sounds checked- equal and bilateral Secured at: 21 cm Tube secured with: Tape Dental Injury: Teeth and Oropharynx as per pre-operative assessment

## 2019-11-08 NOTE — Interval H&P Note (Signed)
History and Physical Interval Note:  11/08/2019 7:03 AM  Elizabeth Lin  has presented today for surgery, with the diagnosis of right spigelian hernia.  The various methods of treatment have been discussed with the patient and family. After consideration of risks, benefits and other options for treatment, the patient has consented to  Procedure(s): LAPAROSCOPIC ASSISTED SPIGELIAN HERNIA REPAIR WITH MESH (N/A) as a surgical intervention.  The patient's history has been reviewed, patient examined, no change in status, stable for surgery.  I have reviewed the patient's chart and labs.  Questions were answered to the patient's satisfaction.     Elizabeth Lin

## 2019-11-08 NOTE — Transfer of Care (Signed)
Immediate Anesthesia Transfer of Care Note  Patient: Elizabeth Lin  Procedure(s) Performed: LAPAROSCOPIC ASSISTED SPIGELIAN HERNIA REPAIR WITH MESH (N/A Abdomen)  Patient Location: PACU  Anesthesia Type:General  Level of Consciousness: awake, alert  and oriented  Airway & Oxygen Therapy: Patient Spontanous Breathing  Post-op Assessment: Report given to RN and Post -op Vital signs reviewed and stable  Post vital signs: Reviewed and stable  Last Vitals:  Vitals Value Taken Time  BP 172/79 11/08/19 0848  Temp    Pulse 65 11/08/19 0852  Resp 14 11/08/19 0852  SpO2 100 % 11/08/19 0852  Vitals shown include unvalidated device data.  Last Pain:  Vitals:   11/08/19 0557  TempSrc: Oral         Complications: No apparent anesthesia complications

## 2019-11-08 NOTE — Op Note (Signed)
Laparoscopic Spigelian Hernia Repair Procedure Note  Indications: This is a 74 year old female with a history of Mnire's disease who presents with approximately 6-7 months of fullness and firmness in her right lower quadrant. She has begun to notice a change in the consistency of her stools. She denies constipation but notices that her stools are smaller and much softer than usual. She also reports some upper abdominal bloating and frequent mild nausea. She denies any vomiting. She has a past abdominal surgical history of laparoscopic appendectomy and right nephrectomy performed approximately 15 years ago at [facility]. We do not have those records. Recently she underwent CT scan that showed what appears to be a right spigelian hernia. The hernia sac seems to contain part of her right colon as well as some small bowel. This is not a full-thickness hernia as the overlying external oblique muscle seems to be intact. The patient had a CT scan performed in December 2015. Retrospective review that scan shows that she had a very small spigelian hernia at that time. The hernia has enlarged significantly in the last 5 years.  Pre-operative Diagnosis: Right lower quadrant Spigelian hernia  Post-operative Diagnosis: None  Surgeon: Wynona Luna   Assistants: None  Anesthesia: General endotracheal anesthesia  ASA Class: 2  Procedure Details  The patient was seen in the Holding Room. The risks, benefits, complications, treatment options, and expected outcomes were discussed with the patient. The possibilities of reaction to medication, pulmonary aspiration, perforation of viscus, bleeding, recurrent infection, the need for additional procedures, failure to diagnose a condition, and creating a complication requiring transfusion or operation were discussed with the patient. The patient concurred with the proposed plan, giving informed consent.  The site of surgery properly noted/marked. The  patient was taken to the operating room, identified as Elizabeth Lin and the procedure verified as laparoscopic ventral hernia repair with mesh. A Time Out was held and the above information confirmed.    The patient was placed supine.  After establishing general anesthesia, a Foley catheter was placed.  The abdomen was prepped with Chloraprep and draped in standard fashion.  A 5 mm Optiview was used the cannulate the peritoneal cavity in the left upper quadrant below the costal margin.  Pneumoperitoneum was obtained by insufflating CO2, maintaining a maximum pressure of 15 mmHg.  The 5 mm 30-degree laparoscope was inserted.  An 11-mm port was placed in the left anterior axillary line at the level of the umbilicus.  Another 5-mm port was placed in the left lower quadrant.  The hernia defect was visualized in the RLQ.  Gentle traction was used to reduce all of the small bowel out of the hernia defect.  We cleared the entire abdominal wall and were able to visualize no other fascial defects.  The defect measured about 3.5 cm. We selected a 11.4 cm piece of Bard Ventralight mesh.  We placed four stay sutures of 0 Novofil around the edges of the mesh.  The mesh was then rolled up and inserted through the 11 mm port site.  The mesh was then unrolled.  The stay sutures were then pulled up through small stab incisions using the Endo-close device.  This deployed the mesh widely over the fascial defects.  The stay sutures were then tied down.  The Secure Strap device was then used to tack down the edges of the mesh at 1 cm intervals circumferentially.  We placed a few tacks inside the outer ring of tacks.  We inspected  for hemostasis.  The fascial defect at the 11 mm port site was closed with a 0 Vicryl using the Endoclose device.  Pneumoperitoneum was then released as we removed the remainder of the trocars.  The port sites were closed with 4-0 Monocryl.  All of the incisions and stay suture sites were then sealed  with Dermabond.  An abdominal binder was placed around the patient's abdomen.  The patient was extubated and brought to the recovery room in stable condition.  All sponge, instrument, and needle counts were correct prior to closure and at the conclusion of the case.   Findings: 3.5 cm RLQ defect  Estimated Blood Loss:  Minimal         Complications:  None; patient tolerated the procedure well.         Disposition: PACU - hemodynamically stable.         Condition: stable  Elizabeth Burn. Georgette Dover, MD, [facility] Surgery  General/ Trauma Surgery   11/08/2019 8:48 AM

## 2019-11-08 NOTE — Anesthesia Postprocedure Evaluation (Signed)
Anesthesia Post Note  Patient: Elizabeth Lin  Procedure(s) Performed: LAPAROSCOPIC ASSISTED SPIGELIAN HERNIA REPAIR WITH MESH (N/A Abdomen)     Patient location during evaluation: PACU Anesthesia Type: General Level of consciousness: awake and alert Pain management: pain level controlled Vital Signs Assessment: post-procedure vital signs reviewed and stable Respiratory status: spontaneous breathing, nonlabored ventilation and respiratory function stable Cardiovascular status: blood pressure returned to baseline and stable Postop Assessment: no apparent nausea or vomiting Anesthetic complications: no    Last Vitals:  Vitals:   11/08/19 1100 11/08/19 1200  BP: (!) 139/55 (!) 153/72  Pulse: (!) 54 (!) 58  Resp: 14 12  Temp: (!) 36.4 C (!) 36.3 C  SpO2: 100% 100%    Last Pain:  Vitals:   11/08/19 1200  TempSrc:   PainSc: 3                  Lucretia Kern

## 2019-11-09 ENCOUNTER — Encounter: Payer: Self-pay | Admitting: *Deleted

## 2019-11-09 DIAGNOSIS — F419 Anxiety disorder, unspecified: Secondary | ICD-10-CM | POA: Diagnosis not present

## 2019-11-09 DIAGNOSIS — K439 Ventral hernia without obstruction or gangrene: Secondary | ICD-10-CM | POA: Diagnosis not present

## 2019-11-09 DIAGNOSIS — M1990 Unspecified osteoarthritis, unspecified site: Secondary | ICD-10-CM | POA: Diagnosis not present

## 2019-11-09 DIAGNOSIS — Z882 Allergy status to sulfonamides status: Secondary | ICD-10-CM | POA: Diagnosis not present

## 2019-11-09 DIAGNOSIS — H9190 Unspecified hearing loss, unspecified ear: Secondary | ICD-10-CM | POA: Diagnosis not present

## 2019-11-09 DIAGNOSIS — Z79899 Other long term (current) drug therapy: Secondary | ICD-10-CM | POA: Diagnosis not present

## 2019-11-09 DIAGNOSIS — H8109 Meniere's disease, unspecified ear: Secondary | ICD-10-CM | POA: Diagnosis not present

## 2019-11-09 DIAGNOSIS — Z86010 Personal history of colonic polyps: Secondary | ICD-10-CM | POA: Diagnosis not present

## 2019-11-09 DIAGNOSIS — Z880 Allergy status to penicillin: Secondary | ICD-10-CM | POA: Diagnosis not present

## 2019-11-09 MED ORDER — OXYCODONE HCL 5 MG PO TABS: 5.0000 mg | ORAL_TABLET | ORAL | 0 refills | Status: DC | PRN

## 2019-11-09 NOTE — Progress Notes (Signed)
Pt taken to the bathroom by nursing student and was able to void 150 cc on her own. Will ;et Dr. Corliss Skains know.

## 2019-11-09 NOTE — Progress Notes (Signed)
Pt unable to void on her own despite pushing PO fluids, walking in halls, and taking muscle relaxer at 0800. Bladder scanned for 275 cc. Call to Dr. Corliss Skains, spoke with triage nurse to let them know that a DC may not be possible today.

## 2019-11-09 NOTE — Plan of Care (Signed)
Pt ready for DC home today 

## 2019-11-09 NOTE — Discharge Summary (Signed)
Physician Discharge Summary  Patient ID: Elizabeth Lin MRN: [id number] DOB/AGE: 75-09-1945 75 y.o.  Admit date: 11/08/2019 Discharge date: 11/09/2019  Admission Diagnoses:  Right Spigelian ventral hernia  Discharge Diagnoses:  Same Active Problems:   Spigelian hernia   Discharged Condition: good  Hospital Course: Laparoscopic hernia repair with mesh on 11/08/19.  She had a 3.5 cm defect that contained some bowel  The bowel was easily reduced.  No Foley catheter was used during surgery.  Her pain was well controlled.  She did have some urinary retention, likely secondary to muscle tightness in her lower abdomen.  She required I&O cath x 2.  I asked nursing to make sure that she was able to urinate on her own prior to discharge.  Consults: None  Treatments: surgery: Laparoscopic repair of Spigelian ventral hernia with mesh.    Discharge Exam: Blood pressure 97/67, pulse (!) 55, temperature 97.7 F (36.5 C), temperature source Oral, resp. rate 18, height 5\' 5"  (1.651 m), weight 95.8 kg, SpO2 98 %. General appearance: alert, cooperative and no distress GI: soft, some soreness in RLQ over the site of the hernia.  Mild soreness over left sided incisions. Incisions all c/d/i with intact Dermabond  Disposition: Discharge disposition: 01-Home or Self Care       Discharge Instructions    Call MD for:  persistant nausea and vomiting   Complete by: As directed    Call MD for:  redness, tenderness, or signs of infection (pain, swelling, redness, odor or green/yellow discharge around incision site)   Complete by: As directed    Call MD for:  severe uncontrolled pain   Complete by: As directed    Call MD for:  temperature >100.4   Complete by: As directed    Diet general   Complete by: As directed    Driving Restrictions   Complete by: As directed    Do not drive while taking pain medications   Increase activity slowly   Complete by: As directed    May shower / Bathe   Complete  by: As directed      Allergies as of 11/09/2019      Reactions   Penicillins Rash   Did it involve swelling of the face/tongue/throat, SOB, or low BP? No Did it involve sudden or severe rash/hives, skin peeling, or any reaction on the inside of your mouth or nose? Yes Did you need to seek medical attention at a hospital or doctor's office? No When did it last happen?~6-10 years ago If all above answers are "NO", may proceed with cephalosporin use.   Amoxicillin-pot Clavulanate    Red rash Did it involve swelling of the face/tongue/throat, SOB, or low BP? No Did it involve sudden or severe rash/hives, skin peeling, or any reaction on the inside of your mouth or nose? Yes Did you need to seek medical attention at a hospital or doctor's office? No When did it last happen?~6-10 years ago If all above answers are "NO", may proceed with cephalosporin use.   Sulfa Antibiotics       Medication List    TAKE these medications   CALCIUM + D + K PO Take 1 tablet by mouth 2 (two) times daily. 1300 mg+D1000 IU   CREAM BASE EX Apply 1 application topically daily as needed (applied to face). [place] Minerals and Vitamin C Complex   DULoxetine 30 MG capsule Commonly known as: CYMBALTA TAKE 1 CAPSULE BY MOUTH EVERY DAY What changed: how much to  take   ESTER-C/BIOFLAVONOIDS PO Take 1-2 tablets by mouth daily with lunch.   fish oil-omega-3 fatty acids 1000 MG capsule Take 1 g by mouth 2 (two) times daily.   Glucosamine Chond Triple/Vit D Tabs Take 1 tablet by mouth 2 (two) times daily.   levothyroxine 150 MCG tablet Commonly known as: SYNTHROID Take 1 tablet (150 mcg total) by mouth daily.   Lubricant Eye Drops 0.4-0.3 % Soln Generic drug: Polyethyl Glycol-Propyl Glycol Place 1 drop into both eyes 3 (three) times daily as needed (dry/irritated eyes.). Rohto Ice Multi-Symptom Relief   naproxen sodium 220 MG tablet Commonly known as: ALEVE Take 220-440 mg by mouth 2 (two)  times daily as needed (pain/soreness).   oxyCODONE 5 MG immediate release tablet Commonly known as: Oxy IR/ROXICODONE Take 1 tablet (5 mg total) by mouth every 4 (four) hours as needed for moderate pain.   triamterene-hydrochlorothiazide 37.5-25 MG tablet Commonly known as: MAXZIDE-25 TAKE 1 TABLET BY MOUTH EVERY DAY   Vicks VapoRub 4.7-1.2-2.6 % Oint Place 1 application into the nose as needed (congestion (deviated septum)).      Follow-up Information    Donnie Mesa, MD. Schedule an appointment as soon as possible for a visit in 3 week(s).   Specialty: General Surgery Contact information: [address] [place] [address] [phone]           Signed: Maia Petties 11/09/2019, 7:38 AM

## 2019-11-09 NOTE — Progress Notes (Signed)
Patient unable to urinate. In and out catheter at 6:53 yielded 400 clear/amber urine.

## 2019-11-09 NOTE — Discharge Instructions (Signed)
[facility] ______[facility] LAPAROSCOPIC SURGERY: POST OP INSTRUCTIONS Always review your discharge instruction sheet given to you by the facility where your surgery was performed. IF YOU HAVE DISABILITY OR FAMILY LEAVE FORMS, YOU MUST BRING THEM TO THE OFFICE FOR PROCESSING.   DO NOT GIVE THEM TO YOUR DOCTOR.  1. A prescription for pain medication may be given to you upon discharge.  Take your pain medication as prescribed, if needed.  If narcotic pain medicine is not needed, then you may take acetaminophen (Tylenol) or ibuprofen (Advil) as needed. 2. Take your usually prescribed medications unless otherwise directed. 3. If you need a refill on your pain medication, please contact your pharmacy.  They will contact our office to request authorization. Prescriptions will not be filled after 5pm or on week-ends. 4. You should follow a light diet the first few days after arrival home, such as soup and crackers, etc.  Be sure to include lots of fluids daily. 5. Most patients will experience some swelling and bruising in the area of the incisions.  Swelling and bruising can take several days to resolve.  You can decide whether ice or a heating pad is more helpful. 6. It is common to experience some constipation if taking pain medication after surgery.  Increasing fluid intake and taking a stool softener (such as Colace) will usually help or prevent this problem from occurring.  A mild laxative (Milk of Magnesia or Miralax) should be taken according to package instructions if there are no bowel movements after 48 hours. 7. Your surgeon used skin glue on the incision.  You may shower in 24 hours.  The glue will flake off over the next 2-3 weeks.  Any sutures or staples will be removed at the office during your follow-up visit. 8. ACTIVITIES:  You may resume regular (light) daily activities beginning the next day--such as daily self-care, walking, climbing stairs--gradually increasing activities as  tolerated.  You may have sexual intercourse when it is comfortable.  Refrain from any heavy lifting or straining until approved by your doctor. a. You may drive when you are no longer taking prescription pain medication, you can comfortably wear a seatbelt, and you can safely maneuver your car and apply brakes. b. RETURN TO WORK:  __________________________________________________________ 9. You should see your doctor in the office for a follow-up appointment approximately 2-3 weeks after your surgery.  Make sure that you call for this appointment within a day or two after you arrive home to insure a convenient appointment time. 10. OTHER INSTRUCTIONS: __________________________________________________________________________________________________________________________ __________________________________________________________________________________________________________________________ WHEN TO CALL YOUR DOCTOR: 1. Fever over 101.0 2. Inability to urinate 3. Continued bleeding from incision. 4. Increased pain, redness, or drainage from the incision. 5. Increasing abdominal pain  The clinic staff is available to answer your questions during regular business hours.  Please don't hesitate to call and ask to speak to one of the nurses for clinical concerns.  If you have a medical emergency, go to the nearest emergency room or call 911.  A surgeon from [facility] is always on call at the hospital. [address], [place], [place]  [address] ? [address] [address], [place], [place]   [address] [phone] ? [phone] ? FAX [phone] Web site: [email or web address]

## 2019-11-21 ENCOUNTER — Other Ambulatory Visit: Payer: Self-pay | Admitting: Family Medicine

## 2019-11-21 DIAGNOSIS — E039 Hypothyroidism, unspecified: Secondary | ICD-10-CM

## 2019-11-21 NOTE — Telephone Encounter (Signed)
Requested medication (s) are due for refill today:   Yes  Requested medication (s) are on the active medication list:   Yes  Future visit scheduled:   No   Last ordered: 11/19/2018  #90  3 refills  Failed protocol.   TSH due.     Returned for provider review for refill.   Requested Prescriptions  Pending Prescriptions Disp Refills   levothyroxine (SYNTHROID) 150 MCG tablet [Pharmacy Med Name: LEVOTHYROXINE 150 MCG TABLET] 90 tablet 3    Sig: TAKE 1 TABLET BY MOUTH EVERY DAY      Endocrinology:  Hypothyroid Agents Failed - 11/21/2019 12:19 AM      Failed - TSH needs to be rechecked within 3 months after an abnormal result. Refill until TSH is due.      Failed - TSH in normal range and within 360 days    TSH  Date Value Ref Range Status  11/19/2018 2.000 0.450 - 4.500 uIU/mL Final          Passed - Valid encounter within last 12 months    Recent Outpatient Visits           2 months ago RLQ fullness   [facility], Asencion Partridge, MD   2 months ago Medicare annual wellness visit, subsequent   [facility], Asencion Partridge, MD   5 months ago Right lower quadrant abdominal pain   [facility], Asencion Partridge, MD   10 months ago Depression, unspecified depression type   [facility], Asencion Partridge, MD   11 months ago LRTI (lower respiratory tract infection)   [facility], Asencion Partridge, MD

## 2019-11-22 NOTE — Telephone Encounter (Signed)
Please advise if pt needs nurse visit or office visit. Also, do not see lab orders for tsh.

## 2019-11-23 ENCOUNTER — Other Ambulatory Visit: Payer: Self-pay

## 2019-11-30 ENCOUNTER — Telehealth: Payer: Self-pay | Admitting: Family Medicine

## 2019-11-30 NOTE — Telephone Encounter (Signed)
Called pt to schedule OV with pcp for labs and med refill. [email or web address]

## 2019-12-08 ENCOUNTER — Other Ambulatory Visit: Payer: Self-pay

## 2019-12-08 ENCOUNTER — Encounter: Payer: Self-pay | Admitting: Family Medicine

## 2019-12-08 ENCOUNTER — Ambulatory Visit (INDEPENDENT_AMBULATORY_CARE_PROVIDER_SITE_OTHER): Payer: Medicare PPO | Admitting: Family Medicine

## 2019-12-08 VITALS — BP 130/80 | HR 74 | Temp 97.3°F | Ht 65.0 in | Wt 236.0 lb

## 2019-12-08 DIAGNOSIS — R030 Elevated blood-pressure reading, without diagnosis of hypertension: Secondary | ICD-10-CM | POA: Diagnosis not present

## 2019-12-08 DIAGNOSIS — F329 Major depressive disorder, single episode, unspecified: Secondary | ICD-10-CM | POA: Diagnosis not present

## 2019-12-08 DIAGNOSIS — E039 Hypothyroidism, unspecified: Secondary | ICD-10-CM | POA: Diagnosis not present

## 2019-12-08 DIAGNOSIS — I10 Essential (primary) hypertension: Secondary | ICD-10-CM | POA: Diagnosis not present

## 2019-12-08 DIAGNOSIS — F32A Depression, unspecified: Secondary | ICD-10-CM

## 2019-12-08 MED ORDER — DULOXETINE HCL 30 MG PO CPEP: 30.0000 mg | ORAL_CAPSULE | Freq: Every day | ORAL | 1 refills | Status: DC

## 2019-12-08 MED ORDER — LEVOTHYROXINE SODIUM 150 MCG PO TABS: 150.0000 ug | ORAL_TABLET | Freq: Every day | ORAL | 3 refills | Status: DC

## 2019-12-08 MED ORDER — TRIAMTERENE-HCTZ 37.5-25 MG PO TABS: 1.0000 | ORAL_TABLET | Freq: Every day | ORAL | 1 refills | Status: DC

## 2019-12-08 NOTE — Progress Notes (Signed)
Subjective:  Patient ID: Elizabeth Lin, female    DOB: 04-14-1946  Age: 74 y.o. MRN: [id number]  CC:  Chief Complaint  Patient presents with  . Hypothyroidism    pt is here to have her thyroid checked it has been over a year since that last time she had a TSH. pt has had a hard time loosing weight, but since the Pandemic started pt hasn't been as active, and she had a resent surgery and couldn't lift anything over 20lb's so pt was even less active after that. pt dose states she needs to change up her diet as well.   . Medication Refill    pt would like a refill on her Synthroid, Cymbalta, and Maxzide. pt states these medication workes well with no side effects.    HPI LOLAMAE VOISIN presents for  Hypothyroidism: Lab Results  Component Value Date   TSH 2.000 11/19/2018  Taking medication daily.  Synthroid 150 mcg daily.  No new hot or cold intolerance. No new hair or skin changes, heart palpitations or new fatigue.  Less exercise with pandemic, recent surgery for spigelian hernia. More active since surgery.  Wt Readings from Last 3 Encounters:  12/08/19 236 lb (107 kg)  11/08/19 211 lb 2 oz (95.8 kg)  11/03/19 211 lb 2 oz (95.8 kg)   Has not had covid vaccine.  Counseling for vaccine given.   Hypertension: Triamterene hydrochlorothiazide 37.5/25 mg daily. recent renal function normal. Home readings. None.  BP Readings from Last 3 Encounters:  12/08/19 130/80  11/09/19 137/73  11/03/19 (!) 143/91   Lab Results  Component Value Date   CREATININE 0.85 11/08/2019    Depression: Cymbalta 30 mg daily. Working well. More active. Mood doing ok.   Depression screen [facility] 2/9 12/08/2019 09/01/2019 08/29/2019 05/27/2019 12/29/2018  Decreased Interest 0 0 0 0 0  Down, Depressed, Hopeless 0 0 0 0 0  PHQ - 2 Score 0 0 0 0 0  Altered sleeping - - - - -  Tired, decreased energy - - - - -  Change in appetite - - - - -  Feeling bad or failure about yourself  - - - - -  Trouble  concentrating - - - - -  Moving slowly or fidgety/restless - - - - -  Suicidal thoughts - - - - -  PHQ-9 Score - - - - -  Difficult doing work/chores - - - - -   Has beginning of cataracts - followed by eye specialist - plans to call eye specialist - no recent acute changes.   History Patient Active Problem List   Diagnosis Date Noted  . 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 Meniere disease, left 12/12/2017  . Mild aortic stenosis 11/10/2017  . Bilateral edema of lower extremity 11/10/2017  . Family history of premature CAD 11/10/2017  . Arthralgia of multiple joints 10/23/2017  . Class 1 obesity due to excess calories without serious comorbidity with body mass index (BMI) of 32.0 to 32.9 in adult 10/23/2017  . Elevated blood pressure reading 10/23/2017  . New onset right bundle branch block (RBBB) 10/23/2017  . Pure hypercholesterolemia 10/23/2017  . Vitamin D deficiency 09/06/2014  . Right knee pain 02/24/2013  . Hypothyroid 11/02/2011  . Depression 11/02/2011  . 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 Allergy   . Anxiety   . Arthritis   . Carpal tunnel syndrome    right hand, getting injections  .  Cataract   . Depression   . Dysrhythmia    BBB  . Hammer toe   . Hypothyroidism due to Hashimoto's thyroiditis   . Meniere disease, right    Past Surgical History:  Procedure Laterality Date  . APPENDECTOMY    . COLONOSCOPY    . CYSTECTOMY     eye  . LAPAROSCOPIC ASSISTED SPIGELIAN HERNIA REPAIR N/A 11/08/2019   Procedure: LAPAROSCOPIC ASSISTED SPIGELIAN HERNIA REPAIR WITH MESH;  Surgeon: Manus Rudd, MD;  Location: [facility];  Service: General;  Laterality: N/A;  . OOPHORECTOMY Right   . TONSILLECTOMY    . TRANSTHORACIC ECHOCARDIOGRAM  10/2016   No regional wall motion normality.  GR 1 DD.  Mild aortic stenosis (mean gradient 9 mmHg).  Mild LA dilation.  November 2018   Allergies  Allergen Reactions  . Penicillins Rash    Did it  involve swelling of the face/tongue/throat, SOB, or low BP? No Did it involve sudden or severe rash/hives, skin peeling, or any reaction on the inside of your mouth or nose? Yes Did you need to seek medical attention at a hospital or doctor's office? No When did it last happen?~6-10 years ago If all above answers are "NO", may proceed with cephalosporin use.   Marland Kitchen Amoxicillin-Pot Clavulanate     Red rash Did it involve swelling of the face/tongue/throat, SOB, or low BP? No Did it involve sudden or severe rash/hives, skin peeling, or any reaction on the inside of your mouth or nose? Yes Did you need to seek medical attention at a hospital or doctor's office? No When did it last happen?~6-10 years ago If all above answers are "NO", may proceed with cephalosporin use.   . Sulfa Antibiotics    Prior to Admission medications   Medication Sig Start Date End Date Taking? Authorizing Provider  Bioflavonoid Products (ESTER-C/BIOFLAVONOIDS PO) Take 1-2 tablets by mouth daily with lunch.   Yes [provider]  Calcium-Vitamin D-Vitamin K (CALCIUM + D + K PO) Take 1 tablet by mouth 2 (two) times daily. 1300 mg+D1000 IU   Yes [provider]  Camphor-Eucalyptus-Menthol (VICKS VAPORUB) 4.7-1.2-2.6 % OINT Place 1 application into the nose as needed (congestion (deviated septum)).   Yes [provider]  CREAM BASE EX Apply 1 application topically daily as needed (applied to face). [place] Minerals and Vitamin C Complex   Yes [provider]  DULoxetine (CYMBALTA) 30 MG capsule TAKE 1 CAPSULE BY MOUTH EVERY DAY Patient taking differently: Take 30 mg by mouth daily.  06/24/19  Yes Shade Flood, MD  fish oil-omega-3 fatty acids 1000 MG capsule Take 1 g by mouth 2 (two) times daily.    Yes [provider]  Gluc-Chonn-MSM-Boswellia-Vit D (GLUCOSAMINE CHOND TRIPLE/VIT D) TABS Take 1 tablet by mouth 2 (two) times daily.   Yes [provider]   ipratropium (ATROVENT) 0.03 % nasal spray ipratropium bromide 21 mcg (0.03 %) nasal spray   Yes [provider]  levothyroxine (SYNTHROID, LEVOTHROID) 150 MCG tablet Take 1 tablet (150 mcg total) by mouth daily. 11/19/18  Yes Shade Flood, MD  naproxen sodium (ALEVE) 220 MG tablet Take 220-440 mg by mouth 2 (two) times daily as needed (pain/soreness).   Yes [provider]  oxyCODONE (OXY IR/ROXICODONE) 5 MG immediate release tablet Take 1 tablet (5 mg total) by mouth every 4 (four) hours as needed for moderate pain. 11/09/19  Yes Manus Rudd, MD  Polyethyl Glycol-Propyl Glycol (LUBRICANT EYE DROPS) 0.4-0.3 % SOLN  Place 1 drop into both eyes 3 (three) times daily as needed (dry/irritated eyes.). Rohto Ice Multi-Symptom Relief   Yes [provider]  triamterene-hydrochlorothiazide (MAXZIDE-25) 37.5-25 MG tablet TAKE 1 TABLET BY MOUTH EVERY DAY Patient taking differently: Take 1 tablet by mouth daily.  06/24/19  Yes Wendie Agreste, MD   Social History   Socioeconomic History  . Marital status: Divorced    Spouse name: Not on file  . Number of children: 3  . Years of education: college  . Highest education level: Bachelor's degree (e.g., BA, AB, BS)  Occupational History  . 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 Smoking status: Former Smoker    Types: Cigarettes    Quit date: 11/01/1974    Years since quitting: 45.1  . Smokeless tobacco: Never Used  Substance and Sexual Activity  . Alcohol use: No    Comment: occasional wine  . Drug use: No  . Sexual activity: Never  Other Topics Concern  . Not on file  Social History Narrative   Patient is Divorced and lives with oldest daughter and grandson.  3 children, 3 grandchildren.   Patient's  Education: [employer].  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.   Patient is right  handed   Caffeine consumption 2-3 daily    Walks daily for roughly 30 minutes at a time.  She has not been doing it in the wintertime due to cold weather.      Social Determinants of Health   Financial Resource Strain:   . Difficulty of Paying Living Expenses:   Food Insecurity:   . Worried About Charity fundraiser in the Last Year:   . Arboriculturist in the Last Year:   Transportation Needs:   . Film/video editor (Medical):   Marland Kitchen Lack of Transportation (Non-Medical):   Physical Activity:   . Days of Exercise per Week:   . Minutes of Exercise per Session:   Stress:   . Feeling of Stress :   Social Connections:   . Frequency of Communication with Friends and Family:   . Frequency of Social Gatherings with Friends and Family:   . Attends Religious Services:   . Active Member of Clubs or Organizations:   . Attends Archivist Meetings:   Marland Kitchen Marital Status:   Intimate Partner Violence:   . Fear of Current or Ex-Partner:   . Emotionally Abused:   Marland Kitchen Physically Abused:   . Sexually Abused:     Review of Systems  Constitutional: Negative for fatigue and unexpected weight change.  Respiratory: Negative for chest tightness and shortness of breath.   Cardiovascular: Negative for chest pain, palpitations and leg swelling.  Gastrointestinal: Negative for abdominal pain and blood in stool.  Neurological: Negative for dizziness, syncope, light-headedness and headaches.     Objective:   Vitals:   12/08/19 1407 12/08/19 1412  BP: (!) 149/76 130/80  Pulse: 74   Temp: (!) 97.3 F (36.3 C)   TempSrc: Temporal   SpO2: 97%   Weight: 236 lb (107 kg)   Height: 5\' 5"  (1.651 m)      Physical Exam Vitals reviewed.  Constitutional:      Appearance: She is well-developed.  HENT:     Head: Normocephalic and atraumatic.  Eyes:     Conjunctiva/sclera: Conjunctivae normal.     Pupils: Pupils are  equal, round, and reactive to light.  Neck:     Vascular: No carotid  bruit.     Comments: No thyromegaly/nodule appreciated.  Cardiovascular:     Rate and Rhythm: Normal rate and regular rhythm.     Heart sounds: Normal heart sounds.  Pulmonary:     Effort: Pulmonary effort is normal.     Breath sounds: Normal breath sounds.  Abdominal:     Palpations: Abdomen is soft. There is no pulsatile mass.     Tenderness: There is no abdominal tenderness.  Skin:    General: Skin is warm and dry.  Neurological:     Mental Status: She is alert and oriented to person, place, and time.  Psychiatric:        Behavior: Behavior normal.        Assessment & Plan:  SHARDEE DIEU is a 74 y.o. female . Hypothyroidism, unspecified type - Plan: TSH, levothyroxine (SYNTHROID) 150 MCG tablet  -  Stable, tolerating current regimen. Medications refilled. Labs pending as above.   -Weight gain likely with pandemic and recent surgery. Anticipate improvement with increased activity  Depression, unspecified depression type - Plan: DULoxetine (CYMBALTA) 30 MG capsule  -Stable with Cymbalta, continue same  Elevated blood pressure reading - Plan: triamterene-hydrochlorothiazide (MAXZIDE-25) 37.5-25 MG tablet Essential hypertension  -  Stable, tolerating current regimen. Medications refilled. Labs with recent creatinine reviewed.  No orders of the defined types were placed in this encounter.  Patient Instructions   No med changes at this time. Let me know if there are questions.  Here is a link to information about the COVID-19 vaccine: [email or web address] COVID-19 Vaccine Information can be found at: [email or web address].nl For questions related to vaccine distribution or appointments, please email [email or web address] or call [phone].  Let me know if you have other vaccine questions.    If you have lab work done today you will be contacted with your lab results within the next 2 weeks.  If  you have not heard from [place] then please contact us. The fastest way to get your results is to register for My Chart.   IF you received an x-ray today, you will receive an invoice from [facility]. Please contact [facility] at [phone] with questions or concerns regarding your invoice.   IF you received labwork today, you will receive an invoice from [place]. Please contact [facility] at [phone] with questions or concerns regarding your invoice.   Our billing staff will not be able to assist you with questions regarding bills from these companies.  You will be contacted with the lab results as soon as they are available. The fastest way to get your results is to activate your My Chart account. Instructions are located on the last page of this paperwork. If you have not heard from [place] regarding the results in 2 weeks, please contact this office.         Signed, Meredith Staggers, MD [facility]

## 2019-12-08 NOTE — Patient Instructions (Addendum)
No med changes at this time. Let me know if there are questions.  Here is a link to information about the COVID-19 vaccine: [email or web address] COVID-19 Vaccine Information can be found at: [email or web address].nl For questions related to vaccine distribution or appointments, please email [email or web address] or call [phone].  Let me know if you have other vaccine questions.    If you have lab work done today you will be contacted with your lab results within the next 2 weeks.  If you have not heard from [place] then please contact us. The fastest way to get your results is to register for My Chart.   IF you received an x-ray today, you will receive an invoice from [facility]. Please contact [facility] at [phone] with questions or concerns regarding your invoice.   IF you received labwork today, you will receive an invoice from [place]. Please contact [facility] at [phone] with questions or concerns regarding your invoice.   Our billing staff will not be able to assist you with questions regarding bills from these companies.  You will be contacted with the lab results as soon as they are available. The fastest way to get your results is to activate your My Chart account. Instructions are located on the last page of this paperwork. If you have not heard from [place] regarding the results in 2 weeks, please contact this office.

## 2019-12-09 ENCOUNTER — Other Ambulatory Visit: Payer: Self-pay

## 2019-12-09 ENCOUNTER — Encounter: Payer: Self-pay | Admitting: Family Medicine

## 2019-12-09 ENCOUNTER — Other Ambulatory Visit (HOSPITAL_COMMUNITY): Payer: Self-pay | Admitting: Cardiology

## 2019-12-09 ENCOUNTER — Ambulatory Visit (HOSPITAL_COMMUNITY): Payer: Medicare PPO | Attending: Cardiovascular Disease

## 2019-12-09 DIAGNOSIS — I4510 Unspecified right bundle-branch block: Secondary | ICD-10-CM

## 2019-12-09 DIAGNOSIS — I350 Nonrheumatic aortic (valve) stenosis: Secondary | ICD-10-CM | POA: Diagnosis not present

## 2019-12-09 HISTORY — PX: TRANSTHORACIC ECHOCARDIOGRAM: SHX275

## 2019-12-09 LAB — TSH: TSH: 6.39 u[IU]/mL — ABNORMAL HIGH (ref 0.450–4.500)

## 2019-12-12 ENCOUNTER — Other Ambulatory Visit: Payer: Self-pay

## 2019-12-12 ENCOUNTER — Telehealth: Payer: Self-pay

## 2019-12-12 DIAGNOSIS — E039 Hypothyroidism, unspecified: Secondary | ICD-10-CM

## 2019-12-12 NOTE — Telephone Encounter (Signed)
Spoke with Nick-pharmacist an advised per greene ok to change levothyroxine 150 mcg from 90 day supply to 30 day supply as pt will be returning in 6 weeks f/u labs to recheck thyroid and decide if dosage will need to be changed.  Pt notified via phone changed from 90 day supply to 30 day supply and f/u lab visit scheduleded on 01/23/2020 at 4:00 pm.  Pt aware and agreeable.

## 2019-12-15 ENCOUNTER — Other Ambulatory Visit: Payer: Self-pay

## 2019-12-15 DIAGNOSIS — E039 Hypothyroidism, unspecified: Secondary | ICD-10-CM

## 2019-12-23 ENCOUNTER — Ambulatory Visit (INDEPENDENT_AMBULATORY_CARE_PROVIDER_SITE_OTHER): Payer: Medicare PPO | Admitting: Family Medicine

## 2019-12-23 ENCOUNTER — Other Ambulatory Visit: Payer: Self-pay

## 2019-12-23 DIAGNOSIS — E039 Hypothyroidism, unspecified: Secondary | ICD-10-CM

## 2019-12-24 LAB — TSH: TSH: 3.92 u[IU]/mL (ref 0.450–4.500)

## 2020-01-06 ENCOUNTER — Encounter: Payer: Self-pay | Admitting: Cardiology

## 2020-01-06 ENCOUNTER — Telehealth: Payer: Self-pay | Admitting: *Deleted

## 2020-01-06 ENCOUNTER — Telehealth (INDEPENDENT_AMBULATORY_CARE_PROVIDER_SITE_OTHER): Payer: Medicare PPO | Admitting: Cardiology

## 2020-01-06 DIAGNOSIS — I350 Nonrheumatic aortic (valve) stenosis: Secondary | ICD-10-CM

## 2020-01-06 DIAGNOSIS — E7800 Pure hypercholesterolemia, unspecified: Secondary | ICD-10-CM | POA: Diagnosis not present

## 2020-01-06 DIAGNOSIS — I4510 Unspecified right bundle-branch block: Secondary | ICD-10-CM | POA: Diagnosis not present

## 2020-01-06 NOTE — Assessment & Plan Note (Signed)
Likely benign finding.  Has had normal wall motion and pump function on echo.  No associated abnormal symptoms.

## 2020-01-06 NOTE — Telephone Encounter (Signed)
  Patient Consent for Virtual Visit         Elizabeth Lin has provided verbal consent on 01/06/2020 for a virtual visit (video or telephone).   CONSENT FOR VIRTUAL VISIT FOR:  Elizabeth Lin  By participating in this virtual visit I agree to the following:  I hereby voluntarily request, consent and authorize [facility] and its employed or contracted physicians, physician assistants, nurse practitioners or other licensed health care professionals (the Practitioner), to provide me with telemedicine health care services (the "Services") as deemed necessary by the treating Practitioner. I acknowledge and consent to receive the Services by the Practitioner via telemedicine. I understand that the telemedicine visit will involve communicating with the Practitioner through live audiovisual communication technology and the disclosure of certain medical information by electronic transmission. I acknowledge that I have been given the opportunity to request an in-person assessment or other available alternative prior to the telemedicine visit and am voluntarily participating in the telemedicine visit.  I understand that I have the right to withhold or withdraw my consent to the use of telemedicine in the course of my care at any time, without affecting my right to future care or treatment, and that the Practitioner or I may terminate the telemedicine visit at any time. I understand that I have the right to inspect all information obtained and/or recorded in the course of the telemedicine visit and may receive copies of available information for a reasonable fee.  I understand that some of the potential risks of receiving the Services via telemedicine include:  Marland Kitchen Delay or interruption in medical evaluation due to technological equipment failure or disruption; . Information transmitted may not be sufficient (e.g. poor resolution of images) to allow for appropriate medical decision making by the  Practitioner; and/or  . In rare instances, security protocols could fail, causing a breach of personal health information.  Furthermore, I acknowledge that it is my responsibility to provide information about my medical history, conditions and care that is complete and accurate to the best of my ability. I acknowledge that Practitioner's advice, recommendations, and/or decision may be based on factors not within their control, such as incomplete or inaccurate data provided by me or distortions of diagnostic images or specimens that may result from electronic transmissions. I understand that the practice of medicine is not an exact science and that Practitioner makes no warranties or guarantees regarding treatment outcomes. I acknowledge that a copy of this consent can be made available to me via my patient portal [facility] MyChart), or I can request a printed copy by calling the office of [facility].    I understand that my insurance will be billed for this visit.   I have read or had this consent read to me. . I understand the contents of this consent, which adequately explains the benefits and risks of the Services being provided via telemedicine.  . I have been provided ample opportunity to ask questions regarding this consent and the Services and have had my questions answered to my satisfaction. . I give my informed consent for the services to be provided through the use of telemedicine in my medical care

## 2020-01-06 NOTE — Assessment & Plan Note (Signed)
Murmur heard on exam.  Initial echo read "mild aortic stenosis with a mean gradient of almost 9 mmHg which is very mild.  Current follow-up echo did not call it aortic stenosis as the gradient was now 7 mmHg.  Talked about risk factors for progressive aortic stenosis being high blood pressure, hyperlipidemia and diabetes if applicable.  She is a current non-smoker therefore a major risk factor treating for her would be blood pressure and cholesterol.  Plan: We will see her back in just about 3 years with plans to order an echocardiogram for the fourth year to follow-up.  If nonprogressive at that point, would probably not reassess.

## 2020-01-06 NOTE — Telephone Encounter (Signed)
RN spoke to patient. Instruction were given  from today's virtual visit 01/06/20.  AVS SUMMARY has been sent by mychart . Recall placed   Patient verbalized understanding

## 2020-01-06 NOTE — Patient Instructions (Addendum)
Medication Instructions:   None -- discuss treatment of cholesterol   *If you need a refill on your cardiac medications before your next appointment, please call your pharmacy*   Lab Work:  Talk with Dr. Neva Seat about checking cholesterol levels to monitor & treat  I   Testing/Procedures:   We can discuss timing of next Echocardiogram at your ~3 year f/u   Follow-Up: At [facility], you and your health needs are our priority.  As part of our continuing mission to provide you with exceptional heart care, we have created designated Provider Care Teams.  These Care Teams include your primary Cardiologist (physician) and Advanced Practice Providers (APPs -  Physician Assistants and Nurse Practitioners) who all work together to provide you with the care you need, when you need it.    Your next appointment:   Before 3 year(s)  The format for your next appointment:   Either In Person or Virtual  Provider:   Bryan Lemma, MD   Other Instructions Schedule your COVID Vaccine

## 2020-01-06 NOTE — Assessment & Plan Note (Signed)
Had not seen lipids checked since January 2019.  I discussed this with her.  Recommendation would be for Dr.Green check labs in the near future to see where she currently stands.  She was trying to make efforts from a diet Standpoint to improve her labs.  Would target an LDL at least less than 100. I indicated that since I am only can be seeing her every several years, would be best for her PCP to manage her lipids, however if there are concerns we can be of assistance if necessary.

## 2020-01-06 NOTE — Progress Notes (Signed)
Virtual Visit via Telephone Note   This visit type was conducted due to national recommendations for restrictions regarding the COVID-19 Pandemic (e.g. social distancing) in an effort to limit this patient's exposure and mitigate transmission in our community.  Due to her co-morbid illnesses, this patient is at least at moderate risk for complications without adequate follow up.  This format is felt to be most appropriate for this patient at this time.  The patient did not have access to video technology/had technical difficulties with video requiring transitioning to audio format only (telephone).  All issues noted in this document were discussed and addressed.  No physical exam could be performed with this format.  Please refer to the patient's chart for her  consent to telehealth for [facility].   Patient has given verbal permission to conduct this visit via virtual appointment and to bill insurance 01/06/2020 12:58 PM     Evaluation Performed:  Follow-up visit  Date:  01/06/2020   ID:  Elizabeth Lin, DOB [birth year], MRN [id number]  Patient Location: Home Provider Location: Other:  Hospital office  PCP:  Shade Flood, MD  Cardiologist:   Bryan Lemma  Electrophysiologist:  None   Chief Complaint:   Chief Complaint  Patient presents with  . 25 month visit    f/u echo , no complaints    History of Present Illness:    Elizabeth Lin is a 74 y.o. female with PMH notable for RIGHT BUNDLE BRANCH BLOCK and VERY MILD AORTIC STENOSIS noted on EKG who presents via audio/video conferencing for a telehealth visit today to follow-up echocardiogram results.   Coronary calcium score = 0  Normal echo with very mild aortic stenosis  Elizabeth Lin was last seen in March 2019 to follow-up from her echocardiogram showing "mild "aortic stenosis.  She now returns here today to discuss recent echocardiogram results to follow-up the valve.  No untoward symptoms noted during last  visit.  She was just anxious.  Hospitalizations:  . November 08, 2019-laparoscopic Spiegelian hernia repair   Recent - Interim CV studies:   . The following studies were reviewed today: . (12/09/2019) Echo: EF 60 to 65%.  No R WMA.  GR 1 DD.  Normal RV function.  Aortic calcification with sclerosis but no suggestion of stenosis.  (Mean gradient 8 mmHg)  Inerval History   Elizabeth Lin is actually talking me to be today from her son's house down to [place].  She is doing well.  In good spirits.  She is not having any untoward symptoms.  No unusual shortness of breath, chest tightness or dyspnea with exertion.   Cardiovascular ROS: no chest pain or dyspnea on exertion positive for - Occasional dizzy spells and weight gain since her surgery negative for - edema, irregular heartbeat, orthopnea, palpitations, paroxysmal nocturnal dyspnea, rapid heart rate, shortness of breath or Syncope/near syncope, TIA/amaurosis fugax, claudication  ROS:  Please see the history of present illness.    The patient does not have symptoms concerning for COVID-19 infection (fever, chills, cough, or new shortness of breath).  Review of Systems  Constitutional: Negative for malaise/fatigue and weight loss.  Gastrointestinal: Negative for blood in stool and melena.  Musculoskeletal: Negative for joint pain (Nothing more than mild arthritis pains).  Neurological: Positive for dizziness (Occasional, positional). Negative for focal weakness and weakness.  Psychiatric/Behavioral: The patient is not nervous/anxious.     The patient is practicing social distancing.  Is looking into scheduling COVID-19 vaccine  upon returning home to [place].  Past Medical History:  Diagnosis Date  . Allergy   . Anxiety   . Arthritis   . Carpal tunnel syndrome    right hand, getting injections  . Cataract   . Depression   . Dysrhythmia    BBB  . Hammer toe   . Hypothyroidism due to Hashimoto's thyroiditis     . Meniere disease, right     Past Surgical History:  Procedure Laterality Date  . APPENDECTOMY    . COLONOSCOPY    . CYSTECTOMY     eye  . LAPAROSCOPIC ASSISTED SPIGELIAN HERNIA REPAIR N/A 11/08/2019   Procedure: LAPAROSCOPIC ASSISTED SPIGELIAN HERNIA REPAIR WITH MESH;  Surgeon: Manus Rudd, MD;  Location: [facility];  Service: General;  Laterality: N/A;  . OOPHORECTOMY Right   . TONSILLECTOMY    . TRANSTHORACIC ECHOCARDIOGRAM  10/2016   No regional wall motion normality.  GR 1 DD.  Mild aortic stenosis (mean gradient 9 mmHg).  Mild LA dilation.  November 2018     Current Meds  Medication Sig  . Bioflavonoid Products (ESTER-C/BIOFLAVONOIDS PO) Take 1-2 tablets by mouth daily with lunch.  . Calcium-Vitamin D-Vitamin K (CALCIUM + D + K PO) Take 1 tablet by mouth 2 (two) times daily. 1300 mg+D1000 IU  . Camphor-Eucalyptus-Menthol (VICKS VAPORUB) 4.7-1.2-2.6 % OINT Place 1 application into the nose as needed (congestion (deviated septum)).  Marland Kitchen CREAM BASE EX Apply 1 application topically daily as needed (applied to face). Dead Sea Minerals and Vitamin C Complex  . DULoxetine (CYMBALTA) 30 MG capsule Take 1 capsule (30 mg total) by mouth daily.  . fish oil-omega-3 fatty acids 1000 MG capsule Take 1 g by mouth 2 (two) times daily.   . Gluc-Chonn-MSM-Boswellia-Vit D (GLUCOSAMINE CHOND TRIPLE/VIT D) TABS Take 1 tablet by mouth 2 (two) times daily.  Marland Kitchen ipratropium (ATROVENT) 0.03 % nasal spray ipratropium bromide 21 mcg (0.03 %) nasal spray  . levothyroxine (SYNTHROID) 150 MCG tablet Take 1 tablet (150 mcg total) by mouth daily.  . naproxen sodium (ALEVE) 220 MG tablet Take 220-440 mg by mouth 2 (two) times daily as needed (pain/soreness).  Bertram Gala Glycol-Propyl Glycol (LUBRICANT EYE DROPS) 0.4-0.3 % SOLN Place 1 drop into both eyes 3 (three) times daily as needed (dry/irritated eyes.). Rohto Ice Multi-Symptom Relief  . triamterene-hydrochlorothiazide (MAXZIDE-25) 37.5-25 MG tablet Take 1 tablet  by mouth daily.     Allergies:   Penicillins, Amoxicillin-pot clavulanate, and Sulfa antibiotics   Social History   Tobacco Use  . Smoking status: Former Smoker    Types: Cigarettes    Quit date: 11/01/1974    Years since quitting: 45.2  . Smokeless tobacco: Never Used  Substance Use Topics  . Alcohol use: No    Comment: occasional wine  . Drug use: No     Family Hx: The patient's family history includes CAD in her father; Cancer in her mother; Depression in her mother and son; Diabetes in her father; Heart disease in her maternal grandmother, paternal grandfather, and paternal grandmother; Hyperlipidemia in her brother; Hypertension in her brother and father; Mental illness in her mother; Myasthenia gravis in her father; Thyroid disease in her daughter and mother. There is no history of Colon cancer.   Labs/Other Tests and Data Reviewed:    EKG:  No ECG reviewed.  Recent Labs: 09/08/2019: BUN 20; Potassium 3.7; Sodium 139 11/08/2019: Creatinine, Ser 0.85; Hemoglobin 13.6; Platelets 312 12/23/2019: TSH 3.920   Recent Lipid Panel Lab Results  Component Value Date/Time   CHOL 261 (H) 10/21/2017 12:03 PM   TRIG 131 10/21/2017 12:03 PM   HDL 83 10/21/2017 12:03 PM   CHOLHDL 3.1 10/21/2017 12:03 PM   CHOLHDL 3.9 08/19/2013 03:10 PM   LDLCALC 152 (H) 10/21/2017 12:03 PM    Wt Readings from Last 3 Encounters:  12/08/19 236 lb (107 kg)  11/08/19 211 lb 2 oz (95.8 kg)  11/03/19 211 lb 2 oz (95.8 kg)     Objective:    Vital Signs:  Ht 5' 5.5" (1.664 m)   BMI 38.68 kg/m   VITAL SIGNS:  reviewed A&O x 3.  normal Mood & Affect Non-labored respirations Seems to be in good spirits  ASSESSMENT & PLAN:    Problem List Items Addressed This Visit    Right bundle branch block (RBBB) (Chronic)    Likely benign finding.  Has had normal wall motion and pump function on echo.  No associated abnormal symptoms.      Mild aortic stenosis (Chronic)    Murmur heard on exam.  Initial  echo read "mild aortic stenosis with a mean gradient of almost 9 mmHg which is very mild.  Current follow-up echo did not call it aortic stenosis as the gradient was now 7 mmHg.  Talked about risk factors for progressive aortic stenosis being high blood pressure, hyperlipidemia and diabetes if applicable.  She is a current non-smoker therefore a major risk factor treating for her would be blood pressure and cholesterol.  Plan: We will see her back in just about 3 years with plans to order an echocardiogram for the fourth year to follow-up.  If nonprogressive at that point, would probably not reassess.      Pure hypercholesterolemia (Chronic)    Had not seen lipids checked since January 2019.  I discussed this with her.  Recommendation would be for Dr.Green check labs in the near future to see where she currently stands.  She was trying to make efforts from a diet Standpoint to improve her labs.  Would target an LDL at least less than 100. I indicated that since I am only can be seeing her every several years, would be best for her PCP to manage her lipids, however if there are concerns we can be of assistance if necessary.         COVID-19 Education: The signs and symptoms of COVID-19 were discussed with the patient and how to seek care for testing (follow up with PCP or arrange E-visit).   The importance of social distancing was discussed today.  I also spent an additional 4 to talking by the importance of the Covid vaccine.  She does plan to schedule an appointment for starting her shots.  Time:   Today, I have spent 14 minutes with the patient with telehealth technology discussing the above problems.     Medication Adjustments/Labs and Tests Ordered: Current medicines are reviewed at length with the patient today.  Concerns regarding medicines are outlined above.   Patient Instructions  Medication Instructions:   None -- discuss treatment of cholesterol   *If you need a  refill on your cardiac medications before your next appointment, please call your pharmacy*   Lab Work:  Talk with Dr. Neva Seat about checking cholesterol levels to monitor & treat  I   Testing/Procedures:   We can discuss timing of next Echocardiogram at your ~3 year f/u   Follow-Up: At [facility], you and your health needs are our priority.  As part of our continuing mission to provide you with exceptional heart care, we have created designated Provider Care Teams.  These Care Teams include your primary Cardiologist (physician) and Advanced Practice Providers (APPs -  Physician Assistants and Nurse Practitioners) who all work together to provide you with the care you need, when you need it.    Your next appointment:   Before 3 year(s)  The format for your next appointment:   Either In Person or Virtual  Provider:   Glenetta Hew, MD   Other Instructions Schedule your COVID Vaccine      Signed, Glenetta Hew, MD  01/06/2020 12:58 PM    [place]

## 2020-01-09 ENCOUNTER — Ambulatory Visit: Payer: Medicare PPO

## 2020-01-23 ENCOUNTER — Ambulatory Visit: Payer: Self-pay

## 2020-01-27 ENCOUNTER — Other Ambulatory Visit: Payer: Self-pay | Admitting: Registered Nurse

## 2020-01-27 ENCOUNTER — Other Ambulatory Visit: Payer: Self-pay

## 2020-01-27 ENCOUNTER — Ambulatory Visit: Payer: Medicare PPO | Admitting: Registered Nurse

## 2020-01-27 ENCOUNTER — Encounter: Payer: Self-pay | Admitting: Registered Nurse

## 2020-01-27 VITALS — BP 144/83 | HR 64 | Temp 97.4°F | Resp 16 | Ht 65.5 in | Wt 213.2 lb

## 2020-01-27 DIAGNOSIS — M549 Dorsalgia, unspecified: Secondary | ICD-10-CM | POA: Diagnosis not present

## 2020-01-27 DIAGNOSIS — M62830 Muscle spasm of back: Secondary | ICD-10-CM | POA: Diagnosis not present

## 2020-01-27 LAB — POCT URINALYSIS DIP (CLINITEK)
Bilirubin, UA: NEGATIVE
Glucose, UA: NEGATIVE mg/dL
Ketones, POC UA: NEGATIVE mg/dL
Nitrite, UA: NEGATIVE
POC PROTEIN,UA: NEGATIVE
Spec Grav, UA: 1.02 (ref 1.010–1.025)
Urobilinogen, UA: 0.2 E.U./dL
pH, UA: 8 (ref 5.0–8.0)

## 2020-01-27 MED ORDER — TIZANIDINE HCL 2 MG PO TABS: 2.0000 mg | ORAL_TABLET | Freq: Three times a day (TID) | ORAL | Status: DC

## 2020-01-27 MED ORDER — TIZANIDINE HCL 2 MG PO CAPS: 2.0000 mg | ORAL_CAPSULE | Freq: Three times a day (TID) | ORAL | 0 refills | Status: DC | PRN

## 2020-01-27 MED ORDER — TIZANIDINE HCL 2 MG PO TABS: 2.0000 mg | ORAL_TABLET | Freq: Three times a day (TID) | ORAL | 0 refills | Status: DC | PRN

## 2020-01-27 MED ORDER — NAPROXEN 500 MG PO TABS: 500.0000 mg | ORAL_TABLET | Freq: Two times a day (BID) | ORAL | 0 refills | Status: DC

## 2020-01-27 MED ORDER — DICLOFENAC SODIUM 75 MG PO TBEC: 75.0000 mg | DELAYED_RELEASE_TABLET | Freq: Two times a day (BID) | ORAL | 0 refills | Status: DC

## 2020-01-27 NOTE — Telephone Encounter (Signed)
This matter has already been addressed. 

## 2020-01-27 NOTE — Patient Instructions (Signed)
° ° ° °  If you have lab work done today you will be contacted with your lab results within the next 2 weeks.  If you have not heard from us then please contact us. The fastest way to get your results is to register for My Chart. ° ° °IF you received an x-ray today, you will receive an invoice from [facility]. Please contact [facility] at [phone] with questions or concerns regarding your invoice.  ° °IF you received labwork today, you will receive an invoice from [facility]. Please contact [facility] at [phone] with questions or concerns regarding your invoice.  ° °Our billing staff will not be able to assist you with questions regarding bills from these companies. ° °You will be contacted with the lab results as soon as they are available. The fastest way to get your results is to activate your My Chart account. Instructions are located on the last page of this paperwork. If you have not heard from us regarding the results in 2 weeks, please contact this office. °  ° ° ° °

## 2020-02-03 NOTE — Progress Notes (Signed)
Acute Office Visit  Subjective:    Patient ID: Elizabeth Lin, female    DOB: [birth year], 74 y.o.   MRN: [id number]  Chief Complaint  Patient presents with  . Back Pain    patient is having some lower back pain for about three days. Per patient states he was sleeping with some heat pad and help a little but yesterday it got worse . She states it feels like a muscle spasm     HPI Patient is in today for lower back pain  About three days. Was moving some heavy objects. Feels like muscle spasm. No radiation to lower extremities. Heat pad and otc analgesics somewhat helpful, but pain persists. Waxing and waning but overall she is concerned it is worsening. No radiculopathy, lower extremity symptoms, saddle anesthesia.  Otherwise no concerns.   Past Medical History:  Diagnosis Date  . Allergy   . Anxiety   . Arthritis   . Carpal tunnel syndrome    right hand, getting injections  . Cataract   . Depression   . Dysrhythmia    BBB  . Hammer toe   . Hypothyroidism due to Hashimoto's thyroiditis   . Meniere disease, right     Past Surgical History:  Procedure Laterality Date  . APPENDECTOMY    . COLONOSCOPY    . CYSTECTOMY     eye  . LAPAROSCOPIC ASSISTED SPIGELIAN HERNIA REPAIR N/A 11/08/2019   Procedure: LAPAROSCOPIC ASSISTED SPIGELIAN HERNIA REPAIR WITH MESH;  Surgeon: Manus Rudd, MD;  Location: [facility];  Service: General;  Laterality: N/A;  . OOPHORECTOMY Right   . TONSILLECTOMY    . TRANSTHORACIC ECHOCARDIOGRAM  10/2016   No regional wall motion normality.  GR 1 DD.  Mild aortic stenosis (mean gradient 9 mmHg).  Mild LA dilation.  November 2018    Family History  Problem Relation Age of Onset  . Diabetes Father   . Myasthenia gravis Father        Died from complications in his 54s  . CAD Father        Was too sick with myasthenia gravis to have CABG  . Hypertension Father   . Thyroid disease Mother   . Cancer Mother        Lung -died at age 32-1/2.  Marland Kitchen  Depression Mother   . Mental illness Mother   . Hypertension Brother        Since age 39  . Hyperlipidemia Brother   . Thyroid disease Daughter   . Depression Son   . Heart disease Maternal Grandmother   . Heart disease Paternal Grandmother   . Heart disease Paternal Grandfather   . Colon cancer Neg Hx     Social History   Socioeconomic History  . Marital status: Divorced    Spouse name: Not on file  . Number of children: 3  . Years of education: college  . Highest education level: Bachelor's degree (e.g., BA, AB, BS)  Occupational History  . 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 Smoking status: Former Smoker    Types: Cigarettes    Quit date: 11/01/1974    Years since quitting: 45.2  . Smokeless tobacco: Never Used  Substance and Sexual Activity  . Alcohol use: No    Comment: occasional wine  . Drug use: No  . Sexual activity: Never  Other Topics Concern  . Not on file  Social History Narrative   Patient  is Divorced and lives with oldest daughter and grandson.  3 children, 3 grandchildren.   Patient's  Education: [employer].  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   Patient is right handed   Caffeine consumption 2-3 daily    Walks daily for roughly 30 minutes at a time.  She has not been doing it in the wintertime due to cold weather.      Social Determinants of Health   Financial Resource Strain:   . Difficulty of Paying Living Expenses:   Food Insecurity:   . Worried About Programme researcher, broadcasting/film/video in the Last Year:   . Barista in the Last Year:   Transportation Needs:   . Freight forwarder (Medical):   Marland Kitchen Lack of Transportation (Non-Medical):   Physical Activity:   . Days of Exercise per Week:   . Minutes of Exercise per Session:   Stress:   . Feeling of Stress :   Social Connections:   . Frequency of Communication  with Friends and Family:   . Frequency of Social Gatherings with Friends and Family:   . Attends Religious Services:   . Active Member of Clubs or Organizations:   . Attends Banker Meetings:   Marland Kitchen Marital Status:   Intimate Partner Violence:   . Fear of Current or Ex-Partner:   . Emotionally Abused:   Marland Kitchen Physically Abused:   . Sexually Abused:     Outpatient Medications Prior to Visit  Medication Sig Dispense Refill  . Bioflavonoid Products (ESTER-C/BIOFLAVONOIDS PO) Take 1-2 tablets by mouth daily with lunch.    . Calcium-Vitamin D-Vitamin K (CALCIUM + D + K PO) Take 1 tablet by mouth 2 (two) times daily. 1300 mg+D1000 IU    . Camphor-Eucalyptus-Menthol (VICKS VAPORUB) 4.7-1.2-2.6 % OINT Place 1 application into the nose as needed (congestion (deviated septum)).    Marland Kitchen CREAM BASE EX Apply 1 application topically daily as needed (applied to face). Dead Sea Minerals and Vitamin C Complex    . DULoxetine (CYMBALTA) 30 MG capsule Take 1 capsule (30 mg total) by mouth daily. 90 capsule 1  . fish oil-omega-3 fatty acids 1000 MG capsule Take 1 g by mouth 2 (two) times daily.     . Gluc-Chonn-MSM-Boswellia-Vit D (GLUCOSAMINE CHOND TRIPLE/VIT D) TABS Take 1 tablet by mouth 2 (two) times daily.    Marland Kitchen ipratropium (ATROVENT) 0.03 % nasal spray ipratropium bromide 21 mcg (0.03 %) nasal spray    . levothyroxine (SYNTHROID) 150 MCG tablet Take 1 tablet (150 mcg total) by mouth daily. 90 tablet 3  . naproxen sodium (ALEVE) 220 MG tablet Take 220-440 mg by mouth 2 (two) times daily as needed (pain/soreness).    Bertram Gala Glycol-Propyl Glycol (LUBRICANT EYE DROPS) 0.4-0.3 % SOLN Place 1 drop into both eyes 3 (three) times daily as needed (dry/irritated eyes.). Rohto Ice Multi-Symptom Relief    . triamterene-hydrochlorothiazide (MAXZIDE-25) 37.5-25 MG tablet Take 1 tablet by mouth daily. 90 tablet 1   No facility-administered medications prior to visit.    Allergies  Allergen Reactions  .  Penicillins Rash    Did it involve swelling of the face/tongue/throat, SOB, or low BP? No Did it involve sudden or severe rash/hives, skin peeling, or any reaction on the inside of your mouth or nose? Yes Did you need to seek medical attention at a hospital or doctor's office? No When did it last happen?~6-10 years ago If  all above answers are "NO", may proceed with cephalosporin use.   Marland Kitchen Amoxicillin-Pot Clavulanate     Red rash Did it involve swelling of the face/tongue/throat, SOB, or low BP? No Did it involve sudden or severe rash/hives, skin peeling, or any reaction on the inside of your mouth or nose? Yes Did you need to seek medical attention at a hospital or doctor's office? No When did it last happen?~6-10 years ago If all above answers are "NO", may proceed with cephalosporin use.   . Sulfa Antibiotics     Review of Systems  Constitutional: Negative.   HENT: Negative.   Eyes: Negative.   Respiratory: Negative.   Cardiovascular: Negative.   Gastrointestinal: Negative.   Endocrine: Negative.   Genitourinary: Negative.   Musculoskeletal: Positive for back pain. Negative for arthralgias, gait problem, joint swelling, myalgias, neck pain and neck stiffness.  Skin: Negative.   Allergic/Immunologic: Negative.   Neurological: Negative.   Hematological: Negative.   Psychiatric/Behavioral: Negative.   All other systems reviewed and are negative.      Objective:    Physical Exam Vitals and nursing note reviewed.  Constitutional:      General: She is not in acute distress.    Appearance: Normal appearance. She is normal weight. She is not ill-appearing or toxic-appearing.  Cardiovascular:     Rate and Rhythm: Normal rate and regular rhythm.  Pulmonary:     Effort: Pulmonary effort is normal. No respiratory distress.  Musculoskeletal:        General: Tenderness and signs of injury present. No swelling or deformity.     Right lower leg: No edema.     Left lower  leg: No edema.     Comments: Limited ROM through all motions of lumbar spine  Skin:    General: Skin is warm and dry.     Capillary Refill: Capillary refill takes 2 to 3 seconds.     Coloration: Skin is not jaundiced or pale.     Findings: No bruising, erythema, lesion or rash.  Neurological:     General: No focal deficit present.     Mental Status: She is alert and oriented to person, place, and time. Mental status is at baseline.  Psychiatric:        Mood and Affect: Mood normal.        Behavior: Behavior normal.        Thought Content: Thought content normal.        Judgment: Judgment normal.     BP (!) 144/83   Pulse 64   Temp (!) 97.4 F (36.3 C) (Temporal)   Resp 16   Ht 5' 5.5" (1.664 m)   Wt 213 lb 3.2 oz (96.7 kg)   SpO2 97%   BMI 34.94 kg/m  Wt Readings from Last 3 Encounters:  01/27/20 213 lb 3.2 oz (96.7 kg)  12/08/19 236 lb (107 kg)  11/08/19 211 lb 2 oz (95.8 kg)    Health Maintenance Due  Topic Date Due  . MAMMOGRAM  10/14/2018    There are no preventive care reminders to display for this patient.   Lab Results  Component Value Date   TSH 3.920 12/23/2019   Lab Results  Component Value Date   WBC 8.8 11/08/2019   HGB 13.6 11/08/2019   HCT 42.9 11/08/2019   MCV 87.9 11/08/2019   PLT 312 11/08/2019   Lab Results  Component Value Date   NA 139 09/08/2019   K 3.7 09/08/2019  CO2 28 09/08/2019   GLUCOSE 80 09/08/2019   BUN 20 09/08/2019   CREATININE 0.85 11/08/2019   BILITOT 0.6 10/21/2017   ALKPHOS 77 10/21/2017   AST 24 10/21/2017   ALT 21 10/21/2017   PROT 7.2 10/21/2017   ALBUMIN 4.5 10/21/2017   CALCIUM 9.4 09/08/2019   GFR 60.44 09/08/2019   Lab Results  Component Value Date   CHOL 261 (H) 10/21/2017   Lab Results  Component Value Date   HDL 83 10/21/2017   Lab Results  Component Value Date   LDLCALC 152 (H) 10/21/2017   Lab Results  Component Value Date   TRIG 131 10/21/2017   Lab Results  Component Value Date    CHOLHDL 3.1 10/21/2017   Lab Results  Component Value Date   HGBA1C 5.6 10/21/2017       Assessment & Plan:   Problem List Items Addressed This Visit    None    Visit Diagnoses    Back pain, unspecified back location, unspecified back pain laterality, unspecified chronicity    -  Primary   Relevant Medications   diclofenac (VOLTAREN) 75 MG EC tablet   tiZANidine (ZANAFLEX) 2 MG tablet   Other Relevant Orders   POCT URINALYSIS DIP (CLINITEK) (Completed)   Lumbar paraspinal muscle spasm       Relevant Medications   diclofenac (VOLTAREN) 75 MG EC tablet       Meds ordered this encounter  Medications  . DISCONTD: tizanidine (ZANAFLEX) 2 MG capsule    Sig: Take 1 capsule (2 mg total) by mouth 3 (three) times daily as needed for muscle spasms.    Dispense:  30 capsule    Refill:  0    Order Specific Question:   Supervising Provider    Answer:   Delia Chimes A [id number]  . diclofenac (VOLTAREN) 75 MG EC tablet    Sig: Take 1 tablet (75 mg total) by mouth 2 (two) times daily.    Dispense:  30 tablet    Refill:  0    Order Specific Question:   Supervising Provider    Answer:   Delia Chimes A [id number]  . tiZANidine (ZANAFLEX) 2 MG tablet    Sig: Take 1 tablet (2 mg total) by mouth every 8 (eight) hours as needed for muscle spasms.    Dispense:  30 tablet    Refill:  0   PLAN  Diclofenac and tizanidine.  Rest, heat/ice, mild stretching  Will consider PT if no relief  Return precautions given  Patient encouraged to call clinic with any questions, comments, or concerns.   Maximiano Coss, NP

## 2020-02-09 DIAGNOSIS — H04123 Dry eye syndrome of bilateral lacrimal glands: Secondary | ICD-10-CM | POA: Diagnosis not present

## 2020-02-09 DIAGNOSIS — H2513 Age-related nuclear cataract, bilateral: Secondary | ICD-10-CM | POA: Diagnosis not present

## 2020-02-15 DIAGNOSIS — M25571 Pain in right ankle and joints of right foot: Secondary | ICD-10-CM | POA: Diagnosis not present

## 2020-02-15 DIAGNOSIS — M25511 Pain in right shoulder: Secondary | ICD-10-CM | POA: Diagnosis not present

## 2020-02-15 DIAGNOSIS — M25512 Pain in left shoulder: Secondary | ICD-10-CM | POA: Diagnosis not present

## 2020-02-15 DIAGNOSIS — M19019 Primary osteoarthritis, unspecified shoulder: Secondary | ICD-10-CM | POA: Diagnosis not present

## 2020-02-17 DIAGNOSIS — M25511 Pain in right shoulder: Secondary | ICD-10-CM | POA: Diagnosis not present

## 2020-02-17 DIAGNOSIS — M19019 Primary osteoarthritis, unspecified shoulder: Secondary | ICD-10-CM | POA: Diagnosis not present

## 2020-02-17 DIAGNOSIS — M25512 Pain in left shoulder: Secondary | ICD-10-CM | POA: Diagnosis not present

## 2020-02-17 DIAGNOSIS — M6281 Muscle weakness (generalized): Secondary | ICD-10-CM | POA: Diagnosis not present

## 2020-02-21 DIAGNOSIS — M25512 Pain in left shoulder: Secondary | ICD-10-CM | POA: Diagnosis not present

## 2020-02-21 DIAGNOSIS — M25511 Pain in right shoulder: Secondary | ICD-10-CM | POA: Diagnosis not present

## 2020-02-21 DIAGNOSIS — M6281 Muscle weakness (generalized): Secondary | ICD-10-CM | POA: Diagnosis not present

## 2020-02-21 DIAGNOSIS — M19019 Primary osteoarthritis, unspecified shoulder: Secondary | ICD-10-CM | POA: Diagnosis not present

## 2020-02-22 DIAGNOSIS — M6281 Muscle weakness (generalized): Secondary | ICD-10-CM | POA: Diagnosis not present

## 2020-02-22 DIAGNOSIS — M25511 Pain in right shoulder: Secondary | ICD-10-CM | POA: Diagnosis not present

## 2020-02-22 DIAGNOSIS — M19019 Primary osteoarthritis, unspecified shoulder: Secondary | ICD-10-CM | POA: Diagnosis not present

## 2020-02-22 DIAGNOSIS — M25512 Pain in left shoulder: Secondary | ICD-10-CM | POA: Diagnosis not present

## 2020-02-23 DIAGNOSIS — M6281 Muscle weakness (generalized): Secondary | ICD-10-CM | POA: Diagnosis not present

## 2020-02-23 DIAGNOSIS — M19019 Primary osteoarthritis, unspecified shoulder: Secondary | ICD-10-CM | POA: Diagnosis not present

## 2020-02-23 DIAGNOSIS — M25511 Pain in right shoulder: Secondary | ICD-10-CM | POA: Diagnosis not present

## 2020-02-23 DIAGNOSIS — M25512 Pain in left shoulder: Secondary | ICD-10-CM | POA: Diagnosis not present

## 2020-02-28 DIAGNOSIS — M19019 Primary osteoarthritis, unspecified shoulder: Secondary | ICD-10-CM | POA: Diagnosis not present

## 2020-02-28 DIAGNOSIS — M25512 Pain in left shoulder: Secondary | ICD-10-CM | POA: Diagnosis not present

## 2020-02-28 DIAGNOSIS — M25511 Pain in right shoulder: Secondary | ICD-10-CM | POA: Diagnosis not present

## 2020-02-28 DIAGNOSIS — M6281 Muscle weakness (generalized): Secondary | ICD-10-CM | POA: Diagnosis not present

## 2020-02-29 DIAGNOSIS — M25512 Pain in left shoulder: Secondary | ICD-10-CM | POA: Diagnosis not present

## 2020-02-29 DIAGNOSIS — M25511 Pain in right shoulder: Secondary | ICD-10-CM | POA: Diagnosis not present

## 2020-02-29 DIAGNOSIS — M19019 Primary osteoarthritis, unspecified shoulder: Secondary | ICD-10-CM | POA: Diagnosis not present

## 2020-02-29 DIAGNOSIS — M6281 Muscle weakness (generalized): Secondary | ICD-10-CM | POA: Diagnosis not present

## 2020-03-02 DIAGNOSIS — M6281 Muscle weakness (generalized): Secondary | ICD-10-CM | POA: Diagnosis not present

## 2020-03-02 DIAGNOSIS — M25512 Pain in left shoulder: Secondary | ICD-10-CM | POA: Diagnosis not present

## 2020-03-02 DIAGNOSIS — M19019 Primary osteoarthritis, unspecified shoulder: Secondary | ICD-10-CM | POA: Diagnosis not present

## 2020-03-02 DIAGNOSIS — M25511 Pain in right shoulder: Secondary | ICD-10-CM | POA: Diagnosis not present

## 2020-03-06 DIAGNOSIS — M25512 Pain in left shoulder: Secondary | ICD-10-CM | POA: Diagnosis not present

## 2020-03-06 DIAGNOSIS — M6281 Muscle weakness (generalized): Secondary | ICD-10-CM | POA: Diagnosis not present

## 2020-03-06 DIAGNOSIS — M19019 Primary osteoarthritis, unspecified shoulder: Secondary | ICD-10-CM | POA: Diagnosis not present

## 2020-03-06 DIAGNOSIS — M25511 Pain in right shoulder: Secondary | ICD-10-CM | POA: Diagnosis not present

## 2020-03-09 DIAGNOSIS — M6281 Muscle weakness (generalized): Secondary | ICD-10-CM | POA: Diagnosis not present

## 2020-03-09 DIAGNOSIS — M19019 Primary osteoarthritis, unspecified shoulder: Secondary | ICD-10-CM | POA: Diagnosis not present

## 2020-03-09 DIAGNOSIS — M25511 Pain in right shoulder: Secondary | ICD-10-CM | POA: Diagnosis not present

## 2020-03-09 DIAGNOSIS — M25512 Pain in left shoulder: Secondary | ICD-10-CM | POA: Diagnosis not present

## 2020-03-14 DIAGNOSIS — M19019 Primary osteoarthritis, unspecified shoulder: Secondary | ICD-10-CM | POA: Diagnosis not present

## 2020-03-14 DIAGNOSIS — M25512 Pain in left shoulder: Secondary | ICD-10-CM | POA: Diagnosis not present

## 2020-03-14 DIAGNOSIS — M25511 Pain in right shoulder: Secondary | ICD-10-CM | POA: Diagnosis not present

## 2020-03-14 DIAGNOSIS — M6281 Muscle weakness (generalized): Secondary | ICD-10-CM | POA: Diagnosis not present

## 2020-03-15 DIAGNOSIS — M19019 Primary osteoarthritis, unspecified shoulder: Secondary | ICD-10-CM | POA: Diagnosis not present

## 2020-03-15 DIAGNOSIS — M25512 Pain in left shoulder: Secondary | ICD-10-CM | POA: Diagnosis not present

## 2020-03-15 DIAGNOSIS — M25511 Pain in right shoulder: Secondary | ICD-10-CM | POA: Diagnosis not present

## 2020-03-15 DIAGNOSIS — M6281 Muscle weakness (generalized): Secondary | ICD-10-CM | POA: Diagnosis not present

## 2020-03-20 DIAGNOSIS — M25512 Pain in left shoulder: Secondary | ICD-10-CM | POA: Diagnosis not present

## 2020-03-20 DIAGNOSIS — M25511 Pain in right shoulder: Secondary | ICD-10-CM | POA: Diagnosis not present

## 2020-03-20 DIAGNOSIS — M19019 Primary osteoarthritis, unspecified shoulder: Secondary | ICD-10-CM | POA: Diagnosis not present

## 2020-03-20 DIAGNOSIS — M6281 Muscle weakness (generalized): Secondary | ICD-10-CM | POA: Diagnosis not present

## 2020-03-23 DIAGNOSIS — M25511 Pain in right shoulder: Secondary | ICD-10-CM | POA: Diagnosis not present

## 2020-03-23 DIAGNOSIS — M6281 Muscle weakness (generalized): Secondary | ICD-10-CM | POA: Diagnosis not present

## 2020-03-23 DIAGNOSIS — M19019 Primary osteoarthritis, unspecified shoulder: Secondary | ICD-10-CM | POA: Diagnosis not present

## 2020-03-23 DIAGNOSIS — M25512 Pain in left shoulder: Secondary | ICD-10-CM | POA: Diagnosis not present

## 2020-03-27 DIAGNOSIS — M25512 Pain in left shoulder: Secondary | ICD-10-CM | POA: Diagnosis not present

## 2020-03-27 DIAGNOSIS — M6281 Muscle weakness (generalized): Secondary | ICD-10-CM | POA: Diagnosis not present

## 2020-03-27 DIAGNOSIS — M19019 Primary osteoarthritis, unspecified shoulder: Secondary | ICD-10-CM | POA: Diagnosis not present

## 2020-03-27 DIAGNOSIS — M25511 Pain in right shoulder: Secondary | ICD-10-CM | POA: Diagnosis not present

## 2020-03-27 DIAGNOSIS — M19012 Primary osteoarthritis, left shoulder: Secondary | ICD-10-CM | POA: Diagnosis not present

## 2020-03-27 DIAGNOSIS — M542 Cervicalgia: Secondary | ICD-10-CM | POA: Diagnosis not present

## 2020-03-29 DIAGNOSIS — M25512 Pain in left shoulder: Secondary | ICD-10-CM | POA: Diagnosis not present

## 2020-03-29 DIAGNOSIS — M25511 Pain in right shoulder: Secondary | ICD-10-CM | POA: Diagnosis not present

## 2020-03-29 DIAGNOSIS — M6281 Muscle weakness (generalized): Secondary | ICD-10-CM | POA: Diagnosis not present

## 2020-03-29 DIAGNOSIS — M19019 Primary osteoarthritis, unspecified shoulder: Secondary | ICD-10-CM | POA: Diagnosis not present

## 2020-04-04 DIAGNOSIS — M25511 Pain in right shoulder: Secondary | ICD-10-CM | POA: Diagnosis not present

## 2020-04-04 DIAGNOSIS — M6281 Muscle weakness (generalized): Secondary | ICD-10-CM | POA: Diagnosis not present

## 2020-04-04 DIAGNOSIS — M19019 Primary osteoarthritis, unspecified shoulder: Secondary | ICD-10-CM | POA: Diagnosis not present

## 2020-04-04 DIAGNOSIS — M25512 Pain in left shoulder: Secondary | ICD-10-CM | POA: Diagnosis not present

## 2020-04-06 DIAGNOSIS — M25512 Pain in left shoulder: Secondary | ICD-10-CM | POA: Diagnosis not present

## 2020-04-06 DIAGNOSIS — M6281 Muscle weakness (generalized): Secondary | ICD-10-CM | POA: Diagnosis not present

## 2020-04-06 DIAGNOSIS — M25511 Pain in right shoulder: Secondary | ICD-10-CM | POA: Diagnosis not present

## 2020-04-06 DIAGNOSIS — M19019 Primary osteoarthritis, unspecified shoulder: Secondary | ICD-10-CM | POA: Diagnosis not present

## 2020-04-10 DIAGNOSIS — M25512 Pain in left shoulder: Secondary | ICD-10-CM | POA: Diagnosis not present

## 2020-04-10 DIAGNOSIS — M6281 Muscle weakness (generalized): Secondary | ICD-10-CM | POA: Diagnosis not present

## 2020-04-10 DIAGNOSIS — M19019 Primary osteoarthritis, unspecified shoulder: Secondary | ICD-10-CM | POA: Diagnosis not present

## 2020-04-10 DIAGNOSIS — M25511 Pain in right shoulder: Secondary | ICD-10-CM | POA: Diagnosis not present

## 2020-04-13 DIAGNOSIS — M19019 Primary osteoarthritis, unspecified shoulder: Secondary | ICD-10-CM | POA: Diagnosis not present

## 2020-04-13 DIAGNOSIS — M25512 Pain in left shoulder: Secondary | ICD-10-CM | POA: Diagnosis not present

## 2020-04-13 DIAGNOSIS — M25511 Pain in right shoulder: Secondary | ICD-10-CM | POA: Diagnosis not present

## 2020-04-13 DIAGNOSIS — M6281 Muscle weakness (generalized): Secondary | ICD-10-CM | POA: Diagnosis not present

## 2020-04-16 DIAGNOSIS — M25512 Pain in left shoulder: Secondary | ICD-10-CM | POA: Diagnosis not present

## 2020-04-16 DIAGNOSIS — M6281 Muscle weakness (generalized): Secondary | ICD-10-CM | POA: Diagnosis not present

## 2020-04-16 DIAGNOSIS — M19019 Primary osteoarthritis, unspecified shoulder: Secondary | ICD-10-CM | POA: Diagnosis not present

## 2020-04-16 DIAGNOSIS — M25511 Pain in right shoulder: Secondary | ICD-10-CM | POA: Diagnosis not present

## 2020-04-17 DIAGNOSIS — G5601 Carpal tunnel syndrome, right upper limb: Secondary | ICD-10-CM | POA: Diagnosis not present

## 2020-04-17 DIAGNOSIS — M19012 Primary osteoarthritis, left shoulder: Secondary | ICD-10-CM | POA: Diagnosis not present

## 2020-04-17 DIAGNOSIS — M79641 Pain in right hand: Secondary | ICD-10-CM | POA: Diagnosis not present

## 2020-04-19 DIAGNOSIS — M25511 Pain in right shoulder: Secondary | ICD-10-CM | POA: Diagnosis not present

## 2020-04-19 DIAGNOSIS — M6281 Muscle weakness (generalized): Secondary | ICD-10-CM | POA: Diagnosis not present

## 2020-04-19 DIAGNOSIS — M19019 Primary osteoarthritis, unspecified shoulder: Secondary | ICD-10-CM | POA: Diagnosis not present

## 2020-04-19 DIAGNOSIS — M25512 Pain in left shoulder: Secondary | ICD-10-CM | POA: Diagnosis not present

## 2020-04-30 DIAGNOSIS — M25511 Pain in right shoulder: Secondary | ICD-10-CM | POA: Diagnosis not present

## 2020-04-30 DIAGNOSIS — M25512 Pain in left shoulder: Secondary | ICD-10-CM | POA: Diagnosis not present

## 2020-04-30 DIAGNOSIS — M19019 Primary osteoarthritis, unspecified shoulder: Secondary | ICD-10-CM | POA: Diagnosis not present

## 2020-04-30 DIAGNOSIS — M6281 Muscle weakness (generalized): Secondary | ICD-10-CM | POA: Diagnosis not present

## 2020-05-03 DIAGNOSIS — M25512 Pain in left shoulder: Secondary | ICD-10-CM | POA: Diagnosis not present

## 2020-05-03 DIAGNOSIS — M19019 Primary osteoarthritis, unspecified shoulder: Secondary | ICD-10-CM | POA: Diagnosis not present

## 2020-05-03 DIAGNOSIS — M25511 Pain in right shoulder: Secondary | ICD-10-CM | POA: Diagnosis not present

## 2020-05-03 DIAGNOSIS — M6281 Muscle weakness (generalized): Secondary | ICD-10-CM | POA: Diagnosis not present

## 2020-05-14 DIAGNOSIS — F341 Dysthymic disorder: Secondary | ICD-10-CM | POA: Diagnosis not present

## 2020-05-17 DIAGNOSIS — M25512 Pain in left shoulder: Secondary | ICD-10-CM | POA: Diagnosis not present

## 2020-05-17 DIAGNOSIS — M25511 Pain in right shoulder: Secondary | ICD-10-CM | POA: Diagnosis not present

## 2020-05-17 DIAGNOSIS — M6281 Muscle weakness (generalized): Secondary | ICD-10-CM | POA: Diagnosis not present

## 2020-05-17 DIAGNOSIS — M19019 Primary osteoarthritis, unspecified shoulder: Secondary | ICD-10-CM | POA: Diagnosis not present

## 2020-05-18 ENCOUNTER — Other Ambulatory Visit: Payer: Self-pay

## 2020-05-18 ENCOUNTER — Ambulatory Visit (HOSPITAL_COMMUNITY)
Admission: EM | Admit: 2020-05-18 | Discharge: 2020-05-18 | Disposition: A | Discharge status: Home / Self Care | Payer: Medicare PPO | Attending: Family Medicine | Admitting: Family Medicine

## 2020-05-18 DIAGNOSIS — Z20822 Contact with and (suspected) exposure to covid-19: Secondary | ICD-10-CM | POA: Diagnosis not present

## 2020-05-18 DIAGNOSIS — G44201 Tension-type headache, unspecified, intractable: Secondary | ICD-10-CM | POA: Insufficient documentation

## 2020-05-18 NOTE — ED Triage Notes (Signed)
Headache and runny nose, exposed to covid on Sunday

## 2020-05-18 NOTE — ED Provider Notes (Signed)
[facility]    CSN: [id number] Arrival date & time: 05/18/20  1711      History   Chief Complaint Chief Complaint  Patient presents with   Headache   Nasal Congestion    HPI Elizabeth Lin is a 74 y.o. female.   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  She was exposed to Covid several days ago.  She reported this to help at work.  They advised her that if she developed any symptoms she needed to be tested.  She developed a headache today so she is here today for testing.  Otherwise well.  Past Medical History:  Diagnosis Date   Allergy    Anxiety    Arthritis    Carpal tunnel syndrome    right hand, getting injections   Cataract    Depression    Dysrhythmia    BBB   Hammer toe    Hypothyroidism due to Hashimoto's thyroiditis    Meniere disease, right     Patient Active Problem List   Diagnosis Date Noted   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/08/2019   Meniere disease, left 12/12/2017   Mild aortic stenosis 11/10/2017   Bilateral edema of lower extremity 11/10/2017   Family history of premature CAD 11/10/2017   Arthralgia of multiple joints 10/23/2017   Class 1 obesity due to excess calories without serious comorbidity with body mass index (BMI) of 32.0 to 32.9 in adult 10/23/2017   Elevated blood pressure reading 10/23/2017   Right bundle branch block (RBBB) 10/23/2017   Pure hypercholesterolemia 10/23/2017   Vitamin D deficiency 09/06/2014   Right knee pain 02/24/2013   Hypothyroid 11/02/2011   Depression 11/02/2011   History of positive PPD 11/02/2011    Past Surgical History:  Procedure Laterality Date   APPENDECTOMY     COLONOSCOPY     CYSTECTOMY     eye   LAPAROSCOPIC ASSISTED SPIGELIAN HERNIA REPAIR N/A 11/08/2019   Procedure: LAPAROSCOPIC ASSISTED SPIGELIAN HERNIA REPAIR WITH MESH;  Surgeon: Manus Rudd, MD;  Location: [facility];  Service: General;  Laterality: N/A;   OOPHORECTOMY Right    TONSILLECTOMY      TRANSTHORACIC ECHOCARDIOGRAM  10/2016   No regional wall motion normality.  GR 1 DD.  Mild aortic stenosis (mean gradient 9 mmHg).  Mild LA dilation.  November 2018    OB History   No obstetric history on file.      Home Medications    Prior to Admission medications   Medication Sig Start Date End Date Taking? Authorizing Provider  Bioflavonoid Products (ESTER-C/BIOFLAVONOIDS PO) Take 1-2 tablets by mouth daily with lunch.    [provider]  Calcium-Vitamin D-Vitamin K (CALCIUM + D + K PO) Take 1 tablet by mouth 2 (two) times daily. 1300 mg+D1000 IU    [provider]  Camphor-Eucalyptus-Menthol (VICKS VAPORUB) 4.7-1.2-2.6 % OINT Place 1 application into the nose as needed (congestion (deviated septum)).    [provider]  CREAM BASE EX Apply 1 application topically daily as needed (applied to face). [place] Minerals and Vitamin C Complex    [provider]  diclofenac (VOLTAREN) 75 MG EC tablet Take 1 tablet (75 mg total) by mouth 2 (two) times daily. 01/27/20   Janeece Agee, NP  DULoxetine (CYMBALTA) 30 MG capsule Take 1 capsule (30 mg total) by mouth daily. 12/08/19   Shade Flood, MD  fish oil-omega-3 fatty acids 1000 MG capsule Take 1 g by mouth 2 (two) times daily.  [provider]  Gluc-Chonn-MSM-Boswellia-Vit D (GLUCOSAMINE CHOND TRIPLE/VIT D) TABS Take 1 tablet by mouth 2 (two) times daily.    [provider]  ipratropium (ATROVENT) 0.03 % nasal spray ipratropium bromide 21 mcg (0.03 %) nasal spray    [provider]  levothyroxine (SYNTHROID) 150 MCG tablet Take 1 tablet (150 mcg total) by mouth daily. 12/08/19   Shade Flood, MD  naproxen (NAPROSYN) 500 MG tablet Take 1 tablet (500 mg total) by mouth 2 (two) times daily with a meal. 01/27/20   Janeece Agee, NP  naproxen sodium (ALEVE) 220 MG tablet Take 220-440 mg by mouth 2 (two) times daily as needed (pain/soreness).    [provider]    Polyethyl Glycol-Propyl Glycol (LUBRICANT EYE DROPS) 0.4-0.3 % SOLN Place 1 drop into both eyes 3 (three) times daily as needed (dry/irritated eyes.). Rohto Ice Multi-Symptom Relief    [provider]  triamterene-hydrochlorothiazide (MAXZIDE-25) 37.5-25 MG tablet Take 1 tablet by mouth daily. 12/08/19   Shade Flood, MD    Family History Family History  Problem Relation Age of Onset   Diabetes Father    Myasthenia gravis Father        Died from complications in his 11s   CAD Father        Was too sick with myasthenia gravis to have CABG   Hypertension Father    Thyroid disease Mother    Cancer Mother        Lung -died at age 23-1/2.   Depression Mother    Mental illness Mother    Hypertension Brother        Since age 61   Hyperlipidemia Brother    Thyroid disease Daughter    Depression Son    Heart disease Maternal Grandmother    Heart disease Paternal Grandmother    Heart disease Paternal Grandfather    Colon cancer Neg Hx     Social History Social History   Tobacco Use   Smoking status: Former Smoker    Types: Cigarettes    Quit date: 11/01/1974    Years since quitting: 45.5   Smokeless tobacco: Never Used  Vaping Use   Vaping Use: Never used  Substance Use Topics   Alcohol use: No    Comment: occasional wine   Drug use: No     Allergies   Penicillins, Amoxicillin-pot clavulanate, and Sulfa antibiotics   Review of Systems Review of Systems See HPI  Physical Exam Triage Vital Signs ED Triage Vitals  Enc Vitals Group     BP 05/18/20 1903 (!) 204/95     Pulse Rate 05/18/20 1903 (!) 57     Resp 05/18/20 1903 16     Temp 05/18/20 1903 97.9 F (36.6 C)     Temp src --      SpO2 05/18/20 1903 98 %     Weight --      Height --      Head Circumference --      Peak Flow --      Pain Score 05/18/20 1920 0     Pain Loc --      Pain Edu? --      Excl. in GC? --    No data found.  Updated Vital Signs BP (!) 171/83     Pulse (!) 57    Temp 97.9 F (36.6 C)    Resp 16    SpO2 98%      Physical Exam Constitutional:  General: She is not in acute distress.    Appearance: She is well-developed.     Comments: Exam by observation.  Mask is in place.  No acute distress  HENT:     Head: Normocephalic and atraumatic.  Eyes:     Conjunctiva/sclera: Conjunctivae normal.     Pupils: Pupils are equal, round, and reactive to light.  Cardiovascular:     Rate and Rhythm: Normal rate.  Pulmonary:     Effort: Pulmonary effort is normal. No respiratory distress.  Abdominal:     General: There is no distension.     Palpations: Abdomen is soft.  Musculoskeletal:        General: Normal range of motion.     Cervical back: Normal range of motion.  Skin:    General: Skin is warm and dry.  Neurological:     Mental Status: She is alert.  Psychiatric:        Behavior: Behavior normal.      UC Treatments / Results  Labs (all labs ordered are listed, but only abnormal results are displayed) Labs Reviewed  SARS CORONAVIRUS 2 (TAT 6-24 HRS)    EKG   Radiology No results found.  Procedures Procedures (including critical care time)  Medications Ordered in UC Medications - No data to display  Initial Impression / Assessment and Plan / UC Course  I have reviewed the triage vital signs and the nursing notes.  Pertinent labs & imaging results that were available during my care of the patient were reviewed by me and considered in my medical decision making (see chart for details).     *Discussed quarantine until test results are available Final Clinical Impressions(s) / UC Diagnoses   Final diagnoses:  Acute intractable tension-type headache  Close exposure to COVID-19 virus     Discharge Instructions     Go home to rest Drink plenty of fluids Take Tylenol for pain or fever You may take over-the-counter cough and cold medicines as needed You must quarantine at home until your test result is  available You can check for your test result in MyChart    ED Prescriptions    None     PDMP not reviewed this encounter.   Eustace Moore, MD 05/18/20 2236

## 2020-05-18 NOTE — Discharge Instructions (Addendum)
Go home to rest Drink plenty of fluids Take Tylenol for pain or fever You may take over-the-counter cough and cold medicines as needed You must quarantine at home until your test result is available You can check for your test result in MyChart  

## 2020-05-19 LAB — SARS CORONAVIRUS 2 (TAT 6-24 HRS): SARS Coronavirus 2: NEGATIVE

## 2020-05-21 DIAGNOSIS — M25511 Pain in right shoulder: Secondary | ICD-10-CM | POA: Diagnosis not present

## 2020-05-21 DIAGNOSIS — M25512 Pain in left shoulder: Secondary | ICD-10-CM | POA: Diagnosis not present

## 2020-05-21 DIAGNOSIS — M6281 Muscle weakness (generalized): Secondary | ICD-10-CM | POA: Diagnosis not present

## 2020-05-21 DIAGNOSIS — M19019 Primary osteoarthritis, unspecified shoulder: Secondary | ICD-10-CM | POA: Diagnosis not present

## 2020-05-24 DIAGNOSIS — M6281 Muscle weakness (generalized): Secondary | ICD-10-CM | POA: Diagnosis not present

## 2020-05-24 DIAGNOSIS — M25511 Pain in right shoulder: Secondary | ICD-10-CM | POA: Diagnosis not present

## 2020-05-24 DIAGNOSIS — M19019 Primary osteoarthritis, unspecified shoulder: Secondary | ICD-10-CM | POA: Diagnosis not present

## 2020-05-24 DIAGNOSIS — M25512 Pain in left shoulder: Secondary | ICD-10-CM | POA: Diagnosis not present

## 2020-05-28 DIAGNOSIS — M19019 Primary osteoarthritis, unspecified shoulder: Secondary | ICD-10-CM | POA: Diagnosis not present

## 2020-05-28 DIAGNOSIS — M25511 Pain in right shoulder: Secondary | ICD-10-CM | POA: Diagnosis not present

## 2020-05-28 DIAGNOSIS — M25512 Pain in left shoulder: Secondary | ICD-10-CM | POA: Diagnosis not present

## 2020-05-28 DIAGNOSIS — M6281 Muscle weakness (generalized): Secondary | ICD-10-CM | POA: Diagnosis not present

## 2020-05-31 DIAGNOSIS — M25512 Pain in left shoulder: Secondary | ICD-10-CM | POA: Diagnosis not present

## 2020-05-31 DIAGNOSIS — M19019 Primary osteoarthritis, unspecified shoulder: Secondary | ICD-10-CM | POA: Diagnosis not present

## 2020-05-31 DIAGNOSIS — M25511 Pain in right shoulder: Secondary | ICD-10-CM | POA: Diagnosis not present

## 2020-05-31 DIAGNOSIS — M6281 Muscle weakness (generalized): Secondary | ICD-10-CM | POA: Diagnosis not present

## 2020-06-05 DIAGNOSIS — M25511 Pain in right shoulder: Secondary | ICD-10-CM | POA: Diagnosis not present

## 2020-06-05 DIAGNOSIS — M19019 Primary osteoarthritis, unspecified shoulder: Secondary | ICD-10-CM | POA: Diagnosis not present

## 2020-06-05 DIAGNOSIS — M25512 Pain in left shoulder: Secondary | ICD-10-CM | POA: Diagnosis not present

## 2020-06-05 DIAGNOSIS — M6281 Muscle weakness (generalized): Secondary | ICD-10-CM | POA: Diagnosis not present

## 2020-06-07 DIAGNOSIS — M19019 Primary osteoarthritis, unspecified shoulder: Secondary | ICD-10-CM | POA: Diagnosis not present

## 2020-06-07 DIAGNOSIS — M6281 Muscle weakness (generalized): Secondary | ICD-10-CM | POA: Diagnosis not present

## 2020-06-07 DIAGNOSIS — M25512 Pain in left shoulder: Secondary | ICD-10-CM | POA: Diagnosis not present

## 2020-06-07 DIAGNOSIS — M25511 Pain in right shoulder: Secondary | ICD-10-CM | POA: Diagnosis not present

## 2020-06-08 ENCOUNTER — Ambulatory Visit: Payer: Medicare PPO | Admitting: Family Medicine

## 2020-06-08 ENCOUNTER — Other Ambulatory Visit: Payer: Self-pay

## 2020-06-08 ENCOUNTER — Encounter: Payer: Self-pay | Admitting: Family Medicine

## 2020-06-08 VITALS — BP 134/82 | HR 70 | Temp 97.9°F | Ht 65.5 in | Wt 211.0 lb

## 2020-06-08 DIAGNOSIS — R5383 Other fatigue: Secondary | ICD-10-CM

## 2020-06-08 DIAGNOSIS — E039 Hypothyroidism, unspecified: Secondary | ICD-10-CM

## 2020-06-08 DIAGNOSIS — I10 Essential (primary) hypertension: Secondary | ICD-10-CM

## 2020-06-08 DIAGNOSIS — R195 Other fecal abnormalities: Secondary | ICD-10-CM | POA: Diagnosis not present

## 2020-06-08 DIAGNOSIS — E785 Hyperlipidemia, unspecified: Secondary | ICD-10-CM

## 2020-06-08 DIAGNOSIS — R030 Elevated blood-pressure reading, without diagnosis of hypertension: Secondary | ICD-10-CM | POA: Diagnosis not present

## 2020-06-08 DIAGNOSIS — F32A Depression, unspecified: Secondary | ICD-10-CM

## 2020-06-08 DIAGNOSIS — R400 Somnolence: Secondary | ICD-10-CM | POA: Diagnosis not present

## 2020-06-08 DIAGNOSIS — F329 Major depressive disorder, single episode, unspecified: Secondary | ICD-10-CM

## 2020-06-08 MED ORDER — TRIAMTERENE-HCTZ 37.5-25 MG PO TABS: 1.0000 | ORAL_TABLET | Freq: Every day | ORAL | 1 refills | Status: DC

## 2020-06-08 MED ORDER — DULOXETINE HCL 30 MG PO CPEP: 30.0000 mg | ORAL_CAPSULE | Freq: Every day | ORAL | 1 refills | Status: DC

## 2020-06-08 NOTE — Progress Notes (Signed)
Subjective:  Patient ID: Elizabeth Lin, female    DOB: 09/03/1946  Age: 74 y.o. MRN: [id number]  CC:  Chief Complaint  Patient presents with  . Follow-up    on hypothyroidism, elevated BP, and depression. Pt reports no unexplained waight chage or fatigue. pt reports feeling tiered, but states she thinks it's from her activity level and age. Pt reports no checking on her BP at home, but she has incresed water intake and lowering salt intake for BP. Pt reports no change to her depression since last OV. PT had a pare to eat at 4AM this morning   . sergical concerns    pt reports after her recent hernia repair surgery she has notced being really gasy and that her BM's haven't been the same since. pt has changed diet incase that could be a factor. removing cheese and lowering dary intake.    HPI Elizabeth Lin presents for   Hypertension: Stable on recheck in March.  Was continued on triamterene HCTZ 37.5/25 mg daily.  Has been drinking more water, trying to lower salt intake for blood pressure improvement. No missed doses.  Home readings:none.  Fatigue at times. Naps during the day. No new chest pains.  Has been told she snores. No sleep study. Wakes up twice per night to urinate.  Daytime somnolence, naps daily. Sleeps 8-9 hrs per night. BP Readings from Last 3 Encounters:  06/08/20 134/82  05/18/20 (!) 171/83  01/27/20 (!) 144/83   Lab Results  Component Value Date   CREATININE 0.85 11/08/2019   Hypothyroidism: Lab Results  Component Value Date   TSH 3.920 12/23/2019  Taking medication daily.  Synthroid 150 mcg daily.  Less exercise with pandemic discussed last visit, now exercising 2 times per week - weights at gym. No new hot or cold intolerance. No new hair or skin changes, heart palpitations. Some fatigue as above. No new weight changes.   Increased gas, bowel changes Had surgery for spigelian hernia earlier this year - 11/08/19.Stools more soft since surgery, had  diarrhea prior. Lighter stools since surgery. No blood in stools.  Nausea at times, no vomiting. Takes meds with foods. Some increased gas since surgery. No soda. Eliminating cheese has been helpful.   Depression: Stable in March on Cymbalta 30 mg daily.  Does report some increasing fatigue.  PHQ score elevated as below. Denies increased depression - just tired. Feels like cymbalta.   Depression screen [facility] 2/9 06/08/2020 01/27/2020 12/08/2019 09/01/2019 08/29/2019  Decreased Interest 2 0 0 0 0  Down, Depressed, Hopeless 0 0 0 0 0  PHQ - 2 Score 2 0 0 0 0  Altered sleeping 2 - - - -  Tired, decreased energy 2 - - - -  Change in appetite 3 - - - -  Feeling bad or failure about yourself  1 - - - -  Trouble concentrating 1 - - - -  Moving slowly or fidgety/restless 3 - - - -  Suicidal thoughts 0 - - - -  PHQ-9 Score 14 - - - -  Difficult doing work/chores - - - - -   Hyperlipidemia: No recent testing. Not on statin. On fish oil.  Lab Results  Component Value Date   CHOL 261 (H) 10/21/2017   HDL 83 10/21/2017   LDLCALC 152 (H) 10/21/2017   TRIG 131 10/21/2017   CHOLHDL 3.1 10/21/2017   Lab Results  Component Value Date   ALT 21 10/21/2017   AST 24 10/21/2017  ALKPHOS 77 10/21/2017   BILITOT 0.6 10/21/2017    History Patient Active Problem List   Diagnosis Date Noted  . 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 Meniere disease, left 12/12/2017  . Mild aortic stenosis 11/10/2017  . Bilateral edema of lower extremity 11/10/2017  . Family history of premature CAD 11/10/2017  . Arthralgia of multiple joints 10/23/2017  . Class 1 obesity due to excess calories without serious comorbidity with body mass index (BMI) of 32.0 to 32.9 in adult 10/23/2017  . Elevated blood pressure reading 10/23/2017  . Right bundle branch block (RBBB) 10/23/2017  . Pure hypercholesterolemia 10/23/2017  . Vitamin D deficiency 09/06/2014  . Right knee pain 02/24/2013  . Hypothyroid 11/02/2011  . Depression  11/02/2011  . 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 Allergy   . Anxiety   . Arthritis   . Carpal tunnel syndrome    right hand, getting injections  . Cataract   . Depression   . Dysrhythmia    BBB  . Hammer toe   . Hypothyroidism due to Hashimoto's thyroiditis   . Meniere disease, right    Past Surgical History:  Procedure Laterality Date  . APPENDECTOMY    . COLONOSCOPY    . CYSTECTOMY     eye  . LAPAROSCOPIC ASSISTED SPIGELIAN HERNIA REPAIR N/A 11/08/2019   Procedure: LAPAROSCOPIC ASSISTED SPIGELIAN HERNIA REPAIR WITH MESH;  Surgeon: Manus Rudd, MD;  Location: [facility];  Service: General;  Laterality: N/A;  . OOPHORECTOMY Right   . TONSILLECTOMY    . TRANSTHORACIC ECHOCARDIOGRAM  10/2016   No regional wall motion normality.  GR 1 DD.  Mild aortic stenosis (mean gradient 9 mmHg).  Mild LA dilation.  November 2018   Allergies  Allergen Reactions  . Penicillins Rash    Did it involve swelling of the face/tongue/throat, SOB, or low BP? No Did it involve sudden or severe rash/hives, skin peeling, or any reaction on the inside of your mouth or nose? Yes Did you need to seek medical attention at a hospital or doctor's office? No When did it last happen?~6-10 years ago If all above answers are "NO", may proceed with cephalosporin use.   Marland Kitchen Amoxicillin-Pot Clavulanate     Red rash Did it involve swelling of the face/tongue/throat, SOB, or low BP? No Did it involve sudden or severe rash/hives, skin peeling, or any reaction on the inside of your mouth or nose? Yes Did you need to seek medical attention at a hospital or doctor's office? No When did it last happen?~6-10 years ago If all above answers are "NO", may proceed with cephalosporin use.   . Sulfa Antibiotics    Prior to Admission medications   Medication Sig Start Date End Date Taking? Authorizing Provider  Bioflavonoid Products (ESTER-C/BIOFLAVONOIDS PO) Take 1-2  tablets by mouth daily with lunch.   Yes [provider]  Calcium-Vitamin D-Vitamin K (CALCIUM + D + K PO) Take 1 tablet by mouth 2 (two) times daily. 1300 mg+D1000 IU   Yes [provider]  Camphor-Eucalyptus-Menthol (VICKS VAPORUB) 4.7-1.2-2.6 % OINT Place 1 application into the nose as needed (congestion (deviated septum)).   Yes [provider]  CREAM BASE EX Apply 1 application topically daily as needed (applied to face). [place] Minerals and Vitamin C Complex   Yes [provider]  DULoxetine (CYMBALTA) 30 MG capsule Take 1 capsule (30 mg total) by mouth daily. 12/08/19  Yes Shade Flood, MD  fish oil-omega-3 fatty acids 1000 MG capsule Take 1 g by mouth 2 (two) times daily.    Yes [provider]  Gluc-Chonn-MSM-Boswellia-Vit D (GLUCOSAMINE CHOND TRIPLE/VIT D) TABS Take 1 tablet by mouth 2 (two) times daily.   Yes [provider]  ipratropium (ATROVENT) 0.03 % nasal spray ipratropium bromide 21 mcg (0.03 %) nasal spray   Yes [provider]  levothyroxine (SYNTHROID) 150 MCG tablet Take 1 tablet (150 mcg total) by mouth daily. 12/08/19  Yes Wendie Agreste, MD  naproxen (NAPROSYN) 500 MG tablet Take 1 tablet (500 mg total) by mouth 2 (two) times daily with a meal. 01/27/20  Yes Maximiano Coss, NP  naproxen sodium (ALEVE) 220 MG tablet Take 220-440 mg by mouth 2 (two) times daily as needed (pain/soreness).   Yes [provider]  Polyethyl Glycol-Propyl Glycol (LUBRICANT EYE DROPS) 0.4-0.3 % SOLN Place 1 drop into both eyes 3 (three) times daily as needed (dry/irritated eyes.). Rohto Ice Multi-Symptom Relief   Yes [provider]  triamterene-hydrochlorothiazide (MAXZIDE-25) 37.5-25 MG tablet Take 1 tablet by mouth daily. 12/08/19  Yes Wendie Agreste, MD  diclofenac (VOLTAREN) 75 MG EC tablet Take 1 tablet (75 mg total) by mouth 2 (two) times daily. Patient not taking: Reported on 06/08/2020 01/27/20   Maximiano Coss, NP   Social History   Socioeconomic History  . Marital status: Divorced    Spouse name: Not on file  . Number of children: 3  . Years of education: college  . Highest education level: Bachelor's degree (e.g., BA, AB, BS)  Occupational History  . 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 Smoking status: Former Smoker    Types: Cigarettes    Quit date: 11/01/1974    Years since quitting: 45.6  . Smokeless tobacco: Never Used  Vaping Use  . Vaping Use: Never used  Substance and Sexual Activity  . Alcohol use: No    Comment: occasional wine  . Drug use: No  . Sexual activity: Never  Other Topics Concern  . Not on file  Social History Narrative   Patient is Divorced and lives with oldest daughter and grandson.  3 children, 3 grandchildren.   Patient's  Education: [employer].  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.   Patient is right handed   Caffeine consumption 2-3 daily    Walks daily for roughly 30 minutes at a time.  She has not been doing it in the wintertime due to cold weather.      Social Determinants of Health   Financial Resource Strain:   . Difficulty of Paying Living Expenses: Not on file  Food Insecurity:   . Worried About Charity fundraiser in the Last Year: Not on file  . Ran Out of Food in the Last Year: Not on file  Transportation Needs:   . Lack of Transportation (Medical): Not on file  . Lack of Transportation (Non-Medical): Not on file  Physical Activity:   . Days of Exercise per Week: Not on file  . Minutes of Exercise per Session: Not on file  Stress:   . Feeling of Stress : Not on file  Social Connections:   . Frequency of Communication with Friends and Family: Not on file  . Frequency of Social Gatherings with Friends and Family: Not on file  . Attends Religious Services: Not on file  .  Active Member of  Clubs or Organizations: Not on file  . Attends Archivist Meetings: Not on file  . Marital Status: Not on file  Intimate Partner Violence:   . Fear of Current or Ex-Partner: Not on file  . Emotionally Abused: Not on file  . Physically Abused: Not on file  . Sexually Abused: Not on file    Review of Systems  Constitutional: Positive for fatigue. Negative for unexpected weight change.  Respiratory: Negative for chest tightness and shortness of breath.   Cardiovascular: Negative for chest pain, palpitations and leg swelling.  Gastrointestinal: Positive for nausea. Negative for abdominal pain, blood in stool and vomiting.  Neurological: Negative for dizziness, syncope, light-headedness and headaches.  Psychiatric/Behavioral: Positive for sleep disturbance.     Objective:   Vitals:   06/08/20 0827 06/08/20 0843  BP: (!) 145/86 134/82  Pulse: 70   Temp: 97.9 F (36.6 C)   TempSrc: Temporal   SpO2: 94%   Weight: 211 lb (95.7 kg)   Height: 5' 5.5" (1.664 m)      Physical Exam Vitals reviewed.  Constitutional:      General: She is not in acute distress.    Appearance: She is well-developed. She is not ill-appearing, toxic-appearing or diaphoretic.  HENT:     Head: Normocephalic and atraumatic.  Eyes:     Conjunctiva/sclera: Conjunctivae normal.     Pupils: Pupils are equal, round, and reactive to light.  Neck:     Vascular: No carotid bruit.  Cardiovascular:     Rate and Rhythm: Normal rate and regular rhythm.     Heart sounds: Normal heart sounds.  Pulmonary:     Effort: Pulmonary effort is normal.     Breath sounds: Normal breath sounds.  Abdominal:     Palpations: Abdomen is soft. There is no pulsatile mass.     Tenderness: There is no abdominal tenderness.  Skin:    General: Skin is warm and dry.  Neurological:     Mental Status: She is alert and oriented to person, place, and time.  Psychiatric:        Mood and Affect: Mood normal.        Behavior:  Behavior normal.        Thought Content: Thought content normal.      Assessment & Plan:  Elizabeth Lin is a 74 y.o. female . Fatigue, unspecified type - Plan: Ambulatory referral to Sleep Studies, TSH + free T4, CBC  Daytime somnolence - Plan: Ambulatory referral to Sleep Studies  -possibly  multifactorial, but suspicious for sleep apnea.  Referred to sleep specialist.  Check CBC, electrolytes.  Repeat TSH.  Hypothyroidism, unspecified type - Plan: TSH + free T4  -No med changes at this time but will check TSH, free T4 with fatigue, borderline TSH previously.  Hypertension, unspecified type - Plan: Comprehensive metabolic panel, Lipid panel  -Stable on recheck, no med changes.  Sleep study as above.  Change in stool - Plan: Ambulatory referral to Gastroenterology  -Some improvement with dietary change, follow-up with gastroenterology to discuss the change in color. Depression, unspecified depression type  -Denies worsening depression, primarily fatigue.  Work-up as above.  Hyperlipidemia, unspecified hyperlipidemia type - Plan: Lipid panel  -No current statin., check lipid panel and decide on medications based on results.  No orders of the defined types were placed in this encounter.  Patient Instructions   I will refer you to sleep specialist and to gastroenterology to discuss the  fatigue, sleep issues, and bowel changes.  I am checking some blood work for fatigue, other information below.  No medication changes today.  Follow-up in 1 month to discuss the symptoms further, sooner if any new or worsening symptoms.   Fatigue If you have fatigue, you feel tired all the time and have a lack of energy or a lack of motivation. Fatigue may make it difficult to start or complete tasks because of exhaustion. In general, occasional or mild fatigue is often a normal response to activity or life. However, long-lasting (chronic) or extreme fatigue may be a symptom of a medical  condition. Follow these instructions at home: General instructions  Watch your fatigue for any changes.  Go to bed and get up at the same time every day.  Avoid fatigue by pacing yourself during the day and getting enough sleep at night.  Maintain a healthy weight. Medicines  Take over-the-counter and prescription medicines only as told by your health care provider.  Take a multivitamin, if told by your health care provider.  Do not use herbal or dietary supplements unless they are approved by your health care provider. Activity   Exercise regularly, as told by your health care provider.  Use or practice techniques to help you relax, such as yoga, tai chi, meditation, or massage therapy. Eating and drinking   Avoid heavy meals in the evening.  Eat a well-balanced diet, which includes lean proteins, whole grains, plenty of fruits and vegetables, and low-fat dairy products.  Avoid consuming too much caffeine.  Avoid the use of alcohol.  Drink enough fluid to keep your urine pale yellow. Lifestyle  Change situations that cause you stress. Try to keep your work and personal schedule in balance.  Do not use any products that contain nicotine or tobacco, such as cigarettes and e-cigarettes. If you need help quitting, ask your health care provider.  Do not use drugs. Contact a health care provider if:  Your fatigue does not get better.  You have a fever.  You suddenly lose or gain weight.  You have headaches.  You have trouble falling asleep or sleeping through the night.  You feel angry, guilty, anxious, or sad.  You are unable to have a bowel movement (constipation).  Your skin is dry.  You have swelling in your legs or another part of your body. Get help right away if:  You feel confused.  Your vision is blurry.  You feel faint or you pass out.  You have a severe headache.  You have severe pain in your abdomen, your back, or the area between your  waist and hips (pelvis).  You have chest pain, shortness of breath, or an irregular or fast heartbeat.  You are unable to urinate, or you urinate less than normal.  You have abnormal bleeding, such as bleeding from the rectum, vagina, nose, lungs, or nipples.  You vomit blood.  You have thoughts about hurting yourself or others. If you ever feel like you may hurt yourself or others, or have thoughts about taking your own life, get help right away. You can go to your nearest emergency department or call:  Your local emergency services (911 in the [place]  A suicide crisis helpline, such as the [employer] at 806-290-5667. This is open 24 hours a day. Summary  If you have fatigue, you feel tired all the time and have a lack of energy or a lack of motivation.  Fatigue may make it difficult  to start or complete tasks because of exhaustion.  Long-lasting (chronic) or extreme fatigue may be a symptom of a medical condition.  Exercise regularly, as told by your health care provider.  Change situations that cause you stress. Try to keep your work and personal schedule in balance. This information is not intended to replace advice given to you by your health care provider. Make sure you discuss any questions you have with your health care provider. Document Revised: 04/06/2019 Document Reviewed: 06/10/2017 Elsevier Patient Education  [employer].   If you have lab work done today you will be contacted with your lab results within the next 2 weeks.  If you have not heard from [place] then please contact us. The fastest way to get your results is to register for My Chart.   IF you received an x-ray today, you will receive an invoice from [facility]. Please contact [facility] at [phone] with questions or concerns regarding your invoice.   IF you received labwork today, you will receive an invoice from [facility]. Please contact [facility] at  [phone] with questions or concerns regarding your invoice.   Our billing staff will not be able to assist you with questions regarding bills from these companies.  You will be contacted with the lab results as soon as they are available. The fastest way to get your results is to activate your My Chart account. Instructions are located on the last page of this paperwork. If you have not heard from [place] regarding the results in 2 weeks, please contact this office.         Signed, Merri Ray, MD [facility]

## 2020-06-08 NOTE — Patient Instructions (Addendum)
I will refer you to sleep specialist and to gastroenterology to discuss the fatigue, sleep issues, and bowel changes.  I am checking some blood work for fatigue, other information below.  No medication changes today.  Follow-up in 1 month to discuss the symptoms further, sooner if any new or worsening symptoms.   Fatigue If you have fatigue, you feel tired all the time and have a lack of energy or a lack of motivation. Fatigue may make it difficult to start or complete tasks because of exhaustion. In general, occasional or mild fatigue is often a normal response to activity or life. However, long-lasting (chronic) or extreme fatigue may be a symptom of a medical condition. Follow these instructions at home: General instructions  Watch your fatigue for any changes.  Go to bed and get up at the same time every day.  Avoid fatigue by pacing yourself during the day and getting enough sleep at night.  Maintain a healthy weight. Medicines  Take over-the-counter and prescription medicines only as told by your health care provider.  Take a multivitamin, if told by your health care provider.  Do not use herbal or dietary supplements unless they are approved by your health care provider. Activity   Exercise regularly, as told by your health care provider.  Use or practice techniques to help you relax, such as yoga, tai chi, meditation, or massage therapy. Eating and drinking   Avoid heavy meals in the evening.  Eat a well-balanced diet, which includes lean proteins, whole grains, plenty of fruits and vegetables, and low-fat dairy products.  Avoid consuming too much caffeine.  Avoid the use of alcohol.  Drink enough fluid to keep your urine pale yellow. Lifestyle  Change situations that cause you stress. Try to keep your work and personal schedule in balance.  Do not use any products that contain nicotine or tobacco, such as cigarettes and e-cigarettes. If you need help quitting,  ask your health care provider.  Do not use drugs. Contact a health care provider if:  Your fatigue does not get better.  You have a fever.  You suddenly lose or gain weight.  You have headaches.  You have trouble falling asleep or sleeping through the night.  You feel angry, guilty, anxious, or sad.  You are unable to have a bowel movement (constipation).  Your skin is dry.  You have swelling in your legs or another part of your body. Get help right away if:  You feel confused.  Your vision is blurry.  You feel faint or you pass out.  You have a severe headache.  You have severe pain in your abdomen, your back, or the area between your waist and hips (pelvis).  You have chest pain, shortness of breath, or an irregular or fast heartbeat.  You are unable to urinate, or you urinate less than normal.  You have abnormal bleeding, such as bleeding from the rectum, vagina, nose, lungs, or nipples.  You vomit blood.  You have thoughts about hurting yourself or others. If you ever feel like you may hurt yourself or others, or have thoughts about taking your own life, get help right away. You can go to your nearest emergency department or call:  Your local emergency services (911 in the [place]  A suicide crisis helpline, such as the National Suicide Prevention Lifeline at 575-448-2743. This is open 24 hours a day. Summary  If you have fatigue, you feel tired all the time and have a lack of  energy or a lack of motivation.  Fatigue may make it difficult to start or complete tasks because of exhaustion.  Long-lasting (chronic) or extreme fatigue may be a symptom of a medical condition.  Exercise regularly, as told by your health care provider.  Change situations that cause you stress. Try to keep your work and personal schedule in balance. This information is not intended to replace advice given to you by your health care provider. Make sure you discuss any questions  you have with your health care provider. Document Revised: 04/06/2019 Document Reviewed: 06/10/2017 Elsevier Patient Education  [employer].   If you have lab work done today you will be contacted with your lab results within the next 2 weeks.  If you have not heard from [place] then please contact us. The fastest way to get your results is to register for My Chart.   IF you received an x-ray today, you will receive an invoice from [facility]. Please contact [facility] at [phone] with questions or concerns regarding your invoice.   IF you received labwork today, you will receive an invoice from [facility]. Please contact [facility] at [phone] with questions or concerns regarding your invoice.   Our billing staff will not be able to assist you with questions regarding bills from these companies.  You will be contacted with the lab results as soon as they are available. The fastest way to get your results is to activate your My Chart account. Instructions are located on the last page of this paperwork. If you have not heard from [place] regarding the results in 2 weeks, please contact this office.

## 2020-06-12 DIAGNOSIS — F341 Dysthymic disorder: Secondary | ICD-10-CM | POA: Diagnosis not present

## 2020-06-12 LAB — COMPREHENSIVE METABOLIC PANEL
ALT: 30 IU/L (ref 0–32)
AST: 29 IU/L (ref 0–40)
Albumin/Globulin Ratio: 1.6 (ref 1.2–2.2)
Albumin: 4.2 g/dL (ref 3.7–4.7)
Alkaline Phosphatase: 86 IU/L (ref 48–121)
BUN/Creatinine Ratio: 24 (ref 12–28)
BUN: 19 mg/dL (ref 8–27)
Bilirubin Total: 0.5 mg/dL (ref 0.0–1.2)
CO2: 26 mmol/L (ref 20–29)
Calcium: 9.5 mg/dL (ref 8.7–10.3)
Chloride: 97 mmol/L (ref 96–106)
Creatinine, Ser: 0.8 mg/dL (ref 0.57–1.00)
GFR calc Af Amer: 84 mL/min/{1.73_m2} (ref >59)
GFR calc non Af Amer: 73 mL/min/{1.73_m2} (ref >59)
Globulin, Total: 2.7 g/dL (ref 1.5–4.5)
Glucose: 103 mg/dL — ABNORMAL HIGH (ref 65–99)
Potassium: 4.3 mmol/L (ref 3.5–5.2)
Sodium: 136 mmol/L (ref 134–144)
Total Protein: 6.9 g/dL (ref 6.0–8.5)

## 2020-06-12 LAB — LIPID PANEL
Chol/HDL Ratio: 3.8 ratio (ref 0.0–4.4)
Cholesterol, Total: 280 mg/dL — ABNORMAL HIGH (ref 100–199)
HDL: 74 mg/dL (ref >39)
LDL Chol Calc (NIH): 177 mg/dL — ABNORMAL HIGH (ref 0–99)
Triglycerides: 159 mg/dL — ABNORMAL HIGH (ref 0–149)
VLDL Cholesterol Cal: 29 mg/dL (ref 5–40)

## 2020-06-12 LAB — CBC
Hematocrit: 44.9 % (ref 34.0–46.6)
Hemoglobin: 13.6 g/dL (ref 11.1–15.9)
MCH: 27.4 pg (ref 26.6–33.0)
MCHC: 30.3 g/dL — ABNORMAL LOW (ref 31.5–35.7)
MCV: 91 fL (ref 79–97)
Platelets: 340 10*3/uL (ref 150–450)
RBC: 4.96 x10E6/uL (ref 3.77–5.28)
RDW: 14.6 % (ref 11.7–15.4)
WBC: 6.3 10*3/uL (ref 3.4–10.8)

## 2020-06-12 LAB — TSH+FREE T4
Free T4: 1.39 ng/dL (ref 0.82–1.77)
TSH: 5.09 u[IU]/mL — ABNORMAL HIGH (ref 0.450–4.500)

## 2020-06-13 DIAGNOSIS — M6281 Muscle weakness (generalized): Secondary | ICD-10-CM | POA: Diagnosis not present

## 2020-06-13 DIAGNOSIS — M25512 Pain in left shoulder: Secondary | ICD-10-CM | POA: Diagnosis not present

## 2020-06-13 DIAGNOSIS — M19019 Primary osteoarthritis, unspecified shoulder: Secondary | ICD-10-CM | POA: Diagnosis not present

## 2020-06-13 DIAGNOSIS — M25511 Pain in right shoulder: Secondary | ICD-10-CM | POA: Diagnosis not present

## 2020-06-15 DIAGNOSIS — M25511 Pain in right shoulder: Secondary | ICD-10-CM | POA: Diagnosis not present

## 2020-06-15 DIAGNOSIS — M6281 Muscle weakness (generalized): Secondary | ICD-10-CM | POA: Diagnosis not present

## 2020-06-15 DIAGNOSIS — M19019 Primary osteoarthritis, unspecified shoulder: Secondary | ICD-10-CM | POA: Diagnosis not present

## 2020-06-15 DIAGNOSIS — M25512 Pain in left shoulder: Secondary | ICD-10-CM | POA: Diagnosis not present

## 2020-06-19 DIAGNOSIS — M25512 Pain in left shoulder: Secondary | ICD-10-CM | POA: Diagnosis not present

## 2020-06-19 DIAGNOSIS — M19019 Primary osteoarthritis, unspecified shoulder: Secondary | ICD-10-CM | POA: Diagnosis not present

## 2020-06-19 DIAGNOSIS — M6281 Muscle weakness (generalized): Secondary | ICD-10-CM | POA: Diagnosis not present

## 2020-06-19 DIAGNOSIS — M25511 Pain in right shoulder: Secondary | ICD-10-CM | POA: Diagnosis not present

## 2020-06-20 DIAGNOSIS — M19019 Primary osteoarthritis, unspecified shoulder: Secondary | ICD-10-CM | POA: Diagnosis not present

## 2020-06-20 DIAGNOSIS — M6281 Muscle weakness (generalized): Secondary | ICD-10-CM | POA: Diagnosis not present

## 2020-06-20 DIAGNOSIS — M25512 Pain in left shoulder: Secondary | ICD-10-CM | POA: Diagnosis not present

## 2020-06-20 DIAGNOSIS — M25511 Pain in right shoulder: Secondary | ICD-10-CM | POA: Diagnosis not present

## 2020-06-27 DIAGNOSIS — M19019 Primary osteoarthritis, unspecified shoulder: Secondary | ICD-10-CM | POA: Diagnosis not present

## 2020-06-27 DIAGNOSIS — M6281 Muscle weakness (generalized): Secondary | ICD-10-CM | POA: Diagnosis not present

## 2020-06-27 DIAGNOSIS — M25511 Pain in right shoulder: Secondary | ICD-10-CM | POA: Diagnosis not present

## 2020-06-27 DIAGNOSIS — M25512 Pain in left shoulder: Secondary | ICD-10-CM | POA: Diagnosis not present

## 2020-07-03 DIAGNOSIS — M19019 Primary osteoarthritis, unspecified shoulder: Secondary | ICD-10-CM | POA: Diagnosis not present

## 2020-07-03 DIAGNOSIS — M25512 Pain in left shoulder: Secondary | ICD-10-CM | POA: Diagnosis not present

## 2020-07-03 DIAGNOSIS — M6281 Muscle weakness (generalized): Secondary | ICD-10-CM | POA: Diagnosis not present

## 2020-07-03 DIAGNOSIS — M25511 Pain in right shoulder: Secondary | ICD-10-CM | POA: Diagnosis not present

## 2020-07-07 ENCOUNTER — Other Ambulatory Visit: Payer: Self-pay

## 2020-07-07 ENCOUNTER — Ambulatory Visit (HOSPITAL_COMMUNITY)
Admission: EM | Admit: 2020-07-07 | Discharge: 2020-07-07 | Disposition: A | Discharge status: Home / Self Care | Payer: Medicare PPO

## 2020-07-07 ENCOUNTER — Encounter (HOSPITAL_COMMUNITY): Payer: Self-pay

## 2020-07-07 DIAGNOSIS — R221 Localized swelling, mass and lump, neck: Secondary | ICD-10-CM | POA: Diagnosis not present

## 2020-07-07 NOTE — Discharge Instructions (Signed)
Your exam is reassuring today. Follow up with your Primary Care provider for any worsening changes as discussed during your visit, otherwise just keep an eye on things.

## 2020-07-07 NOTE — ED Provider Notes (Signed)
[facility]    CSN: [id number] Arrival date & time: 07/07/20  1316      History   Chief Complaint Chief Complaint  Patient presents with   Neck problem    HPI Elizabeth Lin is a 74 y.o. female.   Here today concerned about some swelling above her clavicle near base of neck that she first noticed yesterday. There was no injury to the area and she states it's not warm or tender, no fever, chills, body aches, dysphagia, difficulty breathing. Has not tried anything OTC for this. Has never had this happen in the past.      Past Medical History:  Diagnosis Date   Allergy    Anxiety    Arthritis    Carpal tunnel syndrome    right hand, getting injections   Cataract    Depression    Dysrhythmia    BBB   Hammer toe    Hypothyroidism due to Hashimoto's thyroiditis    Meniere disease, right     Patient Active Problem List   Diagnosis Date Noted   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/08/2019   Meniere disease, left 12/12/2017   Mild aortic stenosis 11/10/2017   Bilateral edema of lower extremity 11/10/2017   Family history of premature CAD 11/10/2017   Arthralgia of multiple joints 10/23/2017   Class 1 obesity due to excess calories without serious comorbidity with body mass index (BMI) of 32.0 to 32.9 in adult 10/23/2017   Elevated blood pressure reading 10/23/2017   Right bundle branch block (RBBB) 10/23/2017   Pure hypercholesterolemia 10/23/2017   Vitamin D deficiency 09/06/2014   Right knee pain 02/24/2013   Hypothyroid 11/02/2011   Depression 11/02/2011   History of positive PPD 11/02/2011    Past Surgical History:  Procedure Laterality Date   APPENDECTOMY     COLONOSCOPY     CYSTECTOMY     eye   LAPAROSCOPIC ASSISTED SPIGELIAN HERNIA REPAIR N/A 11/08/2019   Procedure: LAPAROSCOPIC ASSISTED SPIGELIAN HERNIA REPAIR WITH MESH;  Surgeon: Manus Rudd, MD;  Location: [facility];  Service: General;  Laterality: N/A;   OOPHORECTOMY Right     TONSILLECTOMY     TRANSTHORACIC ECHOCARDIOGRAM  10/2016   No regional wall motion normality.  GR 1 DD.  Mild aortic stenosis (mean gradient 9 mmHg).  Mild LA dilation.  November 2018    OB History   No obstetric history on file.      Home Medications    Prior to Admission medications   Medication Sig Start Date End Date Taking? Authorizing Provider  Bioflavonoid Products (ESTER-C/BIOFLAVONOIDS PO) Take 1-2 tablets by mouth daily with lunch.    [provider]  Calcium-Vitamin D-Vitamin K (CALCIUM + D + K PO) Take 1 tablet by mouth 2 (two) times daily. 1300 mg+D1000 IU    [provider]  Camphor-Eucalyptus-Menthol (VICKS VAPORUB) 4.7-1.2-2.6 % OINT Place 1 application into the nose as needed (congestion (deviated septum)).    [provider]  CREAM BASE EX Apply 1 application topically daily as needed (applied to face). Dead Sea Minerals and Vitamin C Complex    [provider]  DULoxetine (CYMBALTA) 30 MG capsule Take 1 capsule (30 mg total) by mouth daily. 06/08/20   Shade Flood, MD  fish oil-omega-3 fatty acids 1000 MG capsule Take 1 g by mouth 2 (two) times daily.     [provider]  Gluc-Chonn-MSM-Boswellia-Vit D (GLUCOSAMINE CHOND TRIPLE/VIT D) TABS Take 1 tablet by mouth 2 (two) times daily.  [provider]  ipratropium (ATROVENT) 0.03 % nasal spray ipratropium bromide 21 mcg (0.03 %) nasal spray    [provider]  levothyroxine (SYNTHROID) 150 MCG tablet Take 1 tablet (150 mcg total) by mouth daily. 12/08/19   Shade Flood, MD  naproxen (NAPROSYN) 500 MG tablet Take 1 tablet (500 mg total) by mouth 2 (two) times daily with a meal. 01/27/20   Janeece Agee, NP  naproxen sodium (ALEVE) 220 MG tablet Take 220-440 mg by mouth 2 (two) times daily as needed (pain/soreness).    [provider]  Polyethyl Glycol-Propyl Glycol (LUBRICANT EYE DROPS) 0.4-0.3 % SOLN Place 1 drop into both eyes 3 (three)  times daily as needed (dry/irritated eyes.). Rohto Ice Multi-Symptom Relief    [provider]  triamterene-hydrochlorothiazide (MAXZIDE-25) 37.5-25 MG tablet Take 1 tablet by mouth daily. 06/08/20   Shade Flood, MD    Family History Family History  Problem Relation Age of Onset   Diabetes Father    Myasthenia gravis Father        Died from complications in his 25s   CAD Father        Was too sick with myasthenia gravis to have CABG   Hypertension Father    Thyroid disease Mother    Cancer Mother        Lung -died at age 79-1/2.   Depression Mother    Mental illness Mother    Hypertension Brother        Since age 79   Hyperlipidemia Brother    Thyroid disease Daughter    Depression Son    Heart disease Maternal Grandmother    Heart disease Paternal Grandmother    Heart disease Paternal Grandfather    Colon cancer Neg Hx     Social History Social History   Tobacco Use   Smoking status: Former Smoker    Types: Cigarettes    Quit date: 11/01/1974    Years since quitting: 45.7   Smokeless tobacco: Never Used  Vaping Use   Vaping Use: Never used  Substance Use Topics   Alcohol use: No    Comment: occasional wine   Drug use: No     Allergies   Penicillins, Amoxicillin-pot clavulanate, and Sulfa antibiotics   Review of Systems Review of Systems PER HPI    Physical Exam Triage Vital Signs ED Triage Vitals  Enc Vitals Group     BP 07/07/20 1518 (!) 165/87     Pulse Rate 07/07/20 1518 (!) 55     Resp 07/07/20 1518 19     Temp 07/07/20 1518 97.9 F (36.6 C)     Temp Source 07/07/20 1518 Oral     SpO2 07/07/20 1518 100 %     Weight --      Height --      Head Circumference --      Peak Flow --      Pain Score 07/07/20 1517 0     Pain Loc --      Pain Edu? --      Excl. in GC? --    No data found.  Updated Vital Signs BP (!) 165/87 (BP Location: Left Arm)    Pulse (!) 55    Temp 97.9 F (36.6 C) (Oral)    Resp 19     SpO2 100%   Visual Acuity Right Eye Distance:   Left Eye Distance:   Bilateral Distance:    Right Eye Near:   Left Eye  Near:    Bilateral Near:     Physical Exam Vitals and nursing note reviewed.  Constitutional:      Appearance: Normal appearance. She is not ill-appearing.  HENT:     Head: Atraumatic.     Nose: Nose normal.     Mouth/Throat:     Mouth: Mucous membranes are moist.     Pharynx: Oropharynx is clear. No posterior oropharyngeal erythema.  Eyes:     Extraocular Movements: Extraocular movements intact.     Conjunctiva/sclera: Conjunctivae normal.  Neck:     Comments: No thyromegaly Cardiovascular:     Rate and Rhythm: Normal rate and regular rhythm.     Pulses: Normal pulses.     Heart sounds: Normal heart sounds.  Pulmonary:     Effort: Pulmonary effort is normal. No respiratory distress.     Breath sounds: Normal breath sounds.  Musculoskeletal:        General: Swelling (area of minimal localized edema left neck/supraclavicular area but no defined structure palpable concerning for lymphadenopathy, abscess, or other definitive structure) present. Normal range of motion.     Cervical back: Normal range of motion and neck supple. No tenderness.  Lymphadenopathy:     Cervical: No cervical adenopathy.  Skin:    General: Skin is warm and dry.     Findings: No bruising, erythema or rash.  Neurological:     Mental Status: She is alert and oriented to person, place, and time.     Sensory: No sensory deficit.     Motor: No weakness.     Gait: Gait normal.  Psychiatric:        Mood and Affect: Mood normal.        Thought Content: Thought content normal.        Judgment: Judgment normal.      UC Treatments / Results  Labs (all labs ordered are listed, but only abnormal results are displayed) Labs Reviewed - No data to display  EKG   Radiology No results found.  Procedures Procedures (including critical care time)  Medications Ordered in  UC Medications - No data to display  Initial Impression / Assessment and Plan / UC Course  I have reviewed the triage vital signs and the nursing notes.  Pertinent labs & imaging results that were available during my care of the patient were reviewed by me and considered in my medical decision making (see chart for details).     Exam, vitals reassuring, area asymptomatic. Reassurance given to pt, discussed if worsening or changing sxs to f/u with PCP for eval and possible u/s orders but for now continue to monitor.  Final Clinical Impressions(s) / UC Diagnoses   Final diagnoses:  Localized swelling, mass or lump of neck     Discharge Instructions     Your exam is reassuring today. Follow up with your Primary Care provider for any worsening changes as discussed during your visit, otherwise just keep an eye on things.     ED Prescriptions    None     PDMP not reviewed this encounter.   Particia Nearing, [place] 07/07/20 1549

## 2020-07-07 NOTE — ED Notes (Signed)
Pt was called to number provided by the pt and voicemail was left.

## 2020-07-07 NOTE — ED Triage Notes (Signed)
Pt reports the lefty side of the neck is swelling since yesterday.  Pt denies any pain, trauma, fever.

## 2020-07-10 DIAGNOSIS — F341 Dysthymic disorder: Secondary | ICD-10-CM | POA: Diagnosis not present

## 2020-07-17 ENCOUNTER — Other Ambulatory Visit: Payer: Self-pay | Admitting: Physician Assistant

## 2020-07-17 DIAGNOSIS — H6121 Impacted cerumen, right ear: Secondary | ICD-10-CM | POA: Insufficient documentation

## 2020-07-17 DIAGNOSIS — R221 Localized swelling, mass and lump, neck: Secondary | ICD-10-CM | POA: Insufficient documentation

## 2020-07-17 DIAGNOSIS — H903 Sensorineural hearing loss, bilateral: Secondary | ICD-10-CM | POA: Diagnosis not present

## 2020-07-19 ENCOUNTER — Ambulatory Visit
Admission: RE | Admit: 2020-07-19 | Discharge: 2020-07-19 | Disposition: A | Discharge status: Home / Self Care | Payer: Medicare PPO | Source: Ambulatory Visit | Attending: Physician Assistant | Admitting: Physician Assistant

## 2020-07-19 DIAGNOSIS — R221 Localized swelling, mass and lump, neck: Secondary | ICD-10-CM

## 2020-07-26 DIAGNOSIS — M25512 Pain in left shoulder: Secondary | ICD-10-CM | POA: Diagnosis not present

## 2020-07-26 DIAGNOSIS — M6281 Muscle weakness (generalized): Secondary | ICD-10-CM | POA: Diagnosis not present

## 2020-07-26 DIAGNOSIS — M25511 Pain in right shoulder: Secondary | ICD-10-CM | POA: Diagnosis not present

## 2020-07-26 DIAGNOSIS — M19019 Primary osteoarthritis, unspecified shoulder: Secondary | ICD-10-CM | POA: Diagnosis not present

## 2020-08-01 DIAGNOSIS — L578 Other skin changes due to chronic exposure to nonionizing radiation: Secondary | ICD-10-CM | POA: Diagnosis not present

## 2020-08-01 DIAGNOSIS — L570 Actinic keratosis: Secondary | ICD-10-CM | POA: Diagnosis not present

## 2020-08-01 DIAGNOSIS — C44529 Squamous cell carcinoma of skin of other part of trunk: Secondary | ICD-10-CM | POA: Diagnosis not present

## 2020-08-01 DIAGNOSIS — L821 Other seborrheic keratosis: Secondary | ICD-10-CM | POA: Diagnosis not present

## 2020-08-02 ENCOUNTER — Ambulatory Visit: Payer: Medicare PPO | Admitting: Physician Assistant

## 2020-08-02 ENCOUNTER — Encounter: Payer: Self-pay | Admitting: Physician Assistant

## 2020-08-02 VITALS — BP 120/70 | HR 66 | Ht 65.5 in | Wt 208.0 lb

## 2020-08-02 DIAGNOSIS — R143 Flatulence: Secondary | ICD-10-CM

## 2020-08-02 DIAGNOSIS — R194 Change in bowel habit: Secondary | ICD-10-CM

## 2020-08-02 NOTE — Patient Instructions (Signed)
If you are age 75 or older, your body mass index should be between 23-30. Your Body mass index is 34.09 kg/m. If this is out of the aforementioned range listed, please consider follow up with your Primary Care Provider.  If you are age 56 or younger, your body mass index should be between 19-25. Your Body mass index is 34.09 kg/m. If this is out of the aformentioned range listed, please consider follow up with your Primary Care Provider.   Add Benefiber daily in water or liquid as directed.  Avoid lactose and carbonates.   You may use Gas-X as needed.  Follow a low gas diet.  Follow up as needed.

## 2020-08-02 NOTE — Progress Notes (Signed)
Subjective:    Patient ID: Elizabeth Lin, female    DOB: 07/03/1946, 74 y.o.   MRN: [id number]  HPI Elizabeth Lin is a pleasant 74 year old white female, established with Dr. Adela Lin.  She was last seen here in December 2020 as a new patient and at that time had complaints of change in bowel habits with softer stools gassiness bloating and had also noted a protrusion in her right lower quadrant.  CT of the abdomen pelvis was done which showed a large spigelian abdominal wall hernia on the right containing cecum and terminal ileum.  She was subsequently referred to surgery and underwent laparoscopic repair with mesh in February 2021 per Dr. Corliss Lin. She comes in today for follow-up.  She says she is healed well from surgery and has not been having any problems from that standpoint.  She continues to have very soft bowel movements with 1-2 bowel movements per day.  She also has a lot of flatus and gassiness.  No diarrhea or liquid stool, no melena or hematochezia.  No postprandial urgency. She does feel that if she decreases dairy intake her symptoms will improve.  She is started drinking both milk and also has switched to goat cheese though she is not sure that the goat cheese has decreased her symptoms.  She does consume some carbonates in the form of seltzer.  She is not really having any abdominal pain just gas and flatus which she finds embarrassing. She mentions that she had a recent upper abdominal ultrasound done by her PCP/Dr. Karyl Lin and was told that was unremarkable.  I do not have a copy of that report. She is status post remote appendectomy oophorectomy and bowel resection in 2008 in [place]. Last colonoscopy was done in 2015 per Dr. Elnoria Lin and negative.  Review of Systems Pertinent positive and negative review of systems were noted in the above HPI section.  All other review of systems was otherwise negative.  Outpatient Encounter Medications as of 08/02/2020  Medication Sig  .  Bioflavonoid Products (ESTER-C/BIOFLAVONOIDS PO) Take 1-2 tablets by mouth daily with lunch.  . Calcium-Vitamin D-Vitamin K (CALCIUM + D + K PO) Take 1 tablet by mouth 2 (two) times daily. 1300 mg+D1000 IU  . Camphor-Eucalyptus-Menthol (VICKS VAPORUB) 4.7-1.2-2.6 % OINT Place 1 application into the nose as needed (congestion (deviated septum)).  Marland Kitchen CREAM BASE EX Apply 1 application topically daily as needed (applied to face). Dead Sea Minerals and Vitamin C Complex  . DULoxetine (CYMBALTA) 30 MG capsule Take 1 capsule (30 mg total) by mouth daily.  . fish oil-omega-3 fatty acids 1000 MG capsule Take 1 g by mouth 2 (two) times daily.   . Gluc-Chonn-MSM-Boswellia-Vit D (GLUCOSAMINE CHOND TRIPLE/VIT D) TABS Take 1 tablet by mouth 2 (two) times daily.  Marland Kitchen levothyroxine (SYNTHROID) 150 MCG tablet Take 1 tablet (150 mcg total) by mouth daily.  . naproxen (NAPROSYN) 500 MG tablet Take 1 tablet (500 mg total) by mouth 2 (two) times daily with a meal.  . naproxen sodium (ALEVE) 220 MG tablet Take 220-440 mg by mouth 2 (two) times daily as needed (pain/soreness).  Bertram Gala Glycol-Propyl Glycol (LUBRICANT EYE DROPS) 0.4-0.3 % SOLN Place 1 drop into both eyes 3 (three) times daily as needed (dry/irritated eyes.). Rohto Ice Multi-Symptom Relief  . triamterene-hydrochlorothiazide (MAXZIDE-25) 37.5-25 MG tablet Take 1 tablet by mouth daily.  . [DISCONTINUED] ipratropium (ATROVENT) 0.03 % nasal spray ipratropium bromide 21 mcg (0.03 %) nasal spray (Patient not taking: Reported on 08/02/2020)  No facility-administered encounter medications on file as of 08/02/2020.   Allergies  Allergen Reactions  . Penicillins Rash    Did it involve swelling of the face/tongue/throat, SOB, or low BP? No Did it involve sudden or severe rash/hives, skin peeling, or any reaction on the inside of your mouth or nose? Yes Did you need to seek medical attention at a hospital or doctor's office? No When did it last happen?~6-10  years ago If all above answers are "NO", may proceed with cephalosporin use.   Marland Kitchen Amoxicillin-Pot Clavulanate     Red rash Did it involve swelling of the face/tongue/throat, SOB, or low BP? No Did it involve sudden or severe rash/hives, skin peeling, or any reaction on the inside of your mouth or nose? Yes Did you need to seek medical attention at a hospital or doctor's office? No When did it last happen?~6-10 years ago If all above answers are "NO", may proceed with cephalosporin use.   . Sulfa Antibiotics    Patient Active Problem List   Diagnosis Date Noted  . 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 Meniere disease, left 12/12/2017  . Mild aortic stenosis 11/10/2017  . Bilateral edema of lower extremity 11/10/2017  . Family history of premature CAD 11/10/2017  . Arthralgia of multiple joints 10/23/2017  . Class 1 obesity due to excess calories without serious comorbidity with body mass index (BMI) of 32.0 to 32.9 in adult 10/23/2017  . Elevated blood pressure reading 10/23/2017  . Right bundle branch block (RBBB) 10/23/2017  . Pure hypercholesterolemia 10/23/2017  . Vitamin D deficiency 09/06/2014  . Right knee pain 02/24/2013  . Hypothyroid 11/02/2011  . Depression 11/02/2011  . History of positive PPD 11/02/2011   Social History   Socioeconomic History  . Marital status: Divorced    Spouse name: Not on file  . Number of children: 3  . Years of education: college  . Highest education level: Bachelor's degree (e.g., BA, AB, BS)  Occupational History  . 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 Smoking status: Former Smoker    Types: Cigarettes    Quit date: 11/01/1974    Years since quitting: 45.7  . Smokeless tobacco: Never Used  Vaping Use  . Vaping Use: Never used  Substance and Sexual Activity  . Alcohol use: No    Comment: occasional wine  . Drug use: No  . Sexual activity: Never  Other Topics Concern  .  Not on file  Social History Narrative   Patient is Divorced and lives with oldest daughter and grandson.  3 children, 3 grandchildren.   Patient's  Education: [employer].  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   Patient is right handed   Caffeine consumption 2-3 daily    Walks daily for roughly 30 minutes at a time.  She has not been doing it in the wintertime due to cold weather.      Social Determinants of Health   Financial Resource Strain:   . Difficulty of Paying Living Expenses: Not on file  Food Insecurity:   . Worried About Programme researcher, broadcasting/film/video in the Last Year: Not on file  . Ran Out of Food in the Last Year: Not on file  Transportation Needs:   . Lack of Transportation (Medical): Not on file  . Lack of Transportation (Non-Medical): Not on file  Physical Activity:   . Days  of Exercise per Week: Not on file  . Minutes of Exercise per Session: Not on file  Stress:   . Feeling of Stress : Not on file  Social Connections:   . Frequency of Communication with Friends and Family: Not on file  . Frequency of Social Gatherings with Friends and Family: Not on file  . Attends Religious Services: Not on file  . Active Member of Clubs or Organizations: Not on file  . Attends Banker Meetings: Not on file  . Marital Status: Not on file  Intimate Partner Violence:   . Fear of Current or Ex-Partner: Not on file  . Emotionally Abused: Not on file  . Physically Abused: Not on file  . Sexually Abused: Not on file    Elizabeth Lin's family history includes CAD in her father; Cancer in her mother; Depression in her mother and son; Diabetes in her father; Heart disease in her maternal grandmother, paternal grandfather, and paternal grandmother; Hyperlipidemia in her brother; Hypertension in her brother and father; Mental illness in her mother; Myasthenia gravis in her father; Thyroid disease in her  daughter and mother.      Objective:    Vitals:   08/02/20 1003  BP: 120/70  Pulse: 66  SpO2: 96%    Physical Exam Well-developed well-nourished older white female in no acute distress.  Height, Weight, 208 BMI 34.0R  HEENT; nontraumatic normocephalic, EOMI, PE R LA, sclera anicteric. Oropharynx; not examined Neck; supple, no JVD Cardiovascular; regular rate and rhythm with S1-S2, no murmur rub or gallop Pulmonary; Clear bilaterally Abdomen; soft, nontender, nondistended, no palpable mass or hepatosplenomegaly, bowel sounds are active Rectal; not done today Skin; benign exam, no jaundice rash or appreciable lesions Extremities; no clubbing cyanosis or edema skin warm and dry Neuro/Psych; alert and oriented x4, grossly nonfocal mood and affect appropriate       Assessment & Plan:   #59 74 year old female with persistent soft bowel movements and gas with 1-2 bowel movements per day. I think symptoms are consistent with mild IBS, and component of lactose intolerance.  Consider SIBO though really not having any significant bloating.  #2 colon cancer surveillance-negative colonoscopy 2015 per Dr. Elnoria Lin 3.  Status post remote appendectomy/oophorectomy and probable bowel resection 2008 [place]. Status post recent laparoscopic repair of spigelian abdominal wall hernia containing cecum and terminal ileum February 2021  Plan; start trial of Benefiber 1 scoop daily in water or juice, in hopes that bulking stool will reduce symptoms. We discussed a low gas diet and she was given a copy of low gas diet. Decrease carbonate intake Continue lactose avoidance. Patient will follow up with Dr. Adela Lin or myself on an as-needed basis and have asked her to call back for advice if the above measures are not helpful.    Summerlynn Glauser Oswald Hillock PA-C 08/02/2020   Cc: Shade Flood, MD

## 2020-08-03 NOTE — Progress Notes (Signed)
Agree with assessment and plan as outlined.  

## 2020-08-07 DIAGNOSIS — F341 Dysthymic disorder: Secondary | ICD-10-CM | POA: Diagnosis not present

## 2020-08-13 ENCOUNTER — Institutional Professional Consult (permissible substitution): Payer: Medicare PPO | Admitting: Neurology

## 2020-08-13 ENCOUNTER — Encounter: Payer: Self-pay | Admitting: Neurology

## 2020-08-13 ENCOUNTER — Telehealth: Payer: Self-pay

## 2020-08-13 NOTE — Telephone Encounter (Signed)
Pt did not show for their appt with Dr. Athar today.  

## 2020-08-15 DIAGNOSIS — H90A31 Mixed conductive and sensorineural hearing loss, unilateral, right ear with restricted hearing on the contralateral side: Secondary | ICD-10-CM | POA: Diagnosis not present

## 2020-09-05 DIAGNOSIS — D045 Carcinoma in situ of skin of trunk: Secondary | ICD-10-CM | POA: Diagnosis not present

## 2020-09-08 ENCOUNTER — Encounter: Payer: Self-pay | Admitting: Family Medicine

## 2020-09-08 DIAGNOSIS — E785 Hyperlipidemia, unspecified: Secondary | ICD-10-CM

## 2020-09-18 MED ORDER — ATORVASTATIN CALCIUM 10 MG PO TABS: 10.0000 mg | ORAL_TABLET | ORAL | 0 refills | Status: DC

## 2020-09-25 ENCOUNTER — Telehealth: Payer: Self-pay | Admitting: Family Medicine

## 2020-09-25 ENCOUNTER — Telehealth: Payer: Self-pay | Admitting: *Deleted

## 2020-09-25 NOTE — Telephone Encounter (Signed)
Schedule AWV.  

## 2020-09-25 NOTE — Telephone Encounter (Signed)
Pt calling back to sch AWV

## 2020-10-10 ENCOUNTER — Other Ambulatory Visit: Payer: Self-pay | Admitting: Family Medicine

## 2020-10-10 DIAGNOSIS — E039 Hypothyroidism, unspecified: Secondary | ICD-10-CM

## 2020-10-18 ENCOUNTER — Other Ambulatory Visit: Payer: Self-pay

## 2020-10-18 ENCOUNTER — Telehealth: Payer: Self-pay | Admitting: Family Medicine

## 2020-10-18 DIAGNOSIS — E039 Hypothyroidism, unspecified: Secondary | ICD-10-CM

## 2020-10-18 MED ORDER — LEVOTHYROXINE SODIUM 150 MCG PO TABS: 150.0000 ug | ORAL_TABLET | Freq: Every day | ORAL | 3 refills | Status: DC

## 2020-10-18 NOTE — Telephone Encounter (Signed)
sent 

## 2020-10-18 NOTE — Telephone Encounter (Signed)
Patient is needing a refill on   levothyroxine (SYNTHROID) 150 MCG tablet [[id number]]    [id number] - [place], [place] - [address]  [address], [place] [address]  Phone:  [phone] Fax:  [phone]    Patient is there and pharmacy  /

## 2020-11-01 DIAGNOSIS — M25512 Pain in left shoulder: Secondary | ICD-10-CM | POA: Diagnosis not present

## 2020-11-05 DIAGNOSIS — M25512 Pain in left shoulder: Secondary | ICD-10-CM | POA: Diagnosis not present

## 2020-11-08 DIAGNOSIS — M25512 Pain in left shoulder: Secondary | ICD-10-CM | POA: Diagnosis not present

## 2020-11-13 DIAGNOSIS — M25512 Pain in left shoulder: Secondary | ICD-10-CM | POA: Diagnosis not present

## 2020-11-16 DIAGNOSIS — M25512 Pain in left shoulder: Secondary | ICD-10-CM | POA: Diagnosis not present

## 2020-11-19 DIAGNOSIS — M25512 Pain in left shoulder: Secondary | ICD-10-CM | POA: Diagnosis not present

## 2020-11-20 DIAGNOSIS — M5459 Other low back pain: Secondary | ICD-10-CM | POA: Diagnosis not present

## 2020-11-20 DIAGNOSIS — M79604 Pain in right leg: Secondary | ICD-10-CM | POA: Diagnosis not present

## 2020-11-20 DIAGNOSIS — M19071 Primary osteoarthritis, right ankle and foot: Secondary | ICD-10-CM | POA: Diagnosis not present

## 2020-11-20 DIAGNOSIS — M5416 Radiculopathy, lumbar region: Secondary | ICD-10-CM | POA: Diagnosis not present

## 2020-11-22 DIAGNOSIS — M25512 Pain in left shoulder: Secondary | ICD-10-CM | POA: Diagnosis not present

## 2020-11-23 DIAGNOSIS — M6281 Muscle weakness (generalized): Secondary | ICD-10-CM | POA: Diagnosis not present

## 2020-11-23 DIAGNOSIS — M2569 Stiffness of other specified joint, not elsewhere classified: Secondary | ICD-10-CM | POA: Diagnosis not present

## 2020-11-23 DIAGNOSIS — R262 Difficulty in walking, not elsewhere classified: Secondary | ICD-10-CM | POA: Diagnosis not present

## 2020-11-23 DIAGNOSIS — M5416 Radiculopathy, lumbar region: Secondary | ICD-10-CM | POA: Diagnosis not present

## 2020-11-23 DIAGNOSIS — R293 Abnormal posture: Secondary | ICD-10-CM | POA: Diagnosis not present

## 2020-11-26 DIAGNOSIS — M25512 Pain in left shoulder: Secondary | ICD-10-CM | POA: Diagnosis not present

## 2020-11-27 DIAGNOSIS — R293 Abnormal posture: Secondary | ICD-10-CM | POA: Diagnosis not present

## 2020-11-27 DIAGNOSIS — M2569 Stiffness of other specified joint, not elsewhere classified: Secondary | ICD-10-CM | POA: Diagnosis not present

## 2020-11-27 DIAGNOSIS — M5416 Radiculopathy, lumbar region: Secondary | ICD-10-CM | POA: Diagnosis not present

## 2020-11-27 DIAGNOSIS — M6281 Muscle weakness (generalized): Secondary | ICD-10-CM | POA: Diagnosis not present

## 2020-11-27 DIAGNOSIS — R262 Difficulty in walking, not elsewhere classified: Secondary | ICD-10-CM | POA: Diagnosis not present

## 2020-11-28 DIAGNOSIS — M25512 Pain in left shoulder: Secondary | ICD-10-CM | POA: Diagnosis not present

## 2020-11-29 DIAGNOSIS — M6281 Muscle weakness (generalized): Secondary | ICD-10-CM | POA: Diagnosis not present

## 2020-11-29 DIAGNOSIS — R293 Abnormal posture: Secondary | ICD-10-CM | POA: Diagnosis not present

## 2020-11-29 DIAGNOSIS — M2569 Stiffness of other specified joint, not elsewhere classified: Secondary | ICD-10-CM | POA: Diagnosis not present

## 2020-11-29 DIAGNOSIS — M5416 Radiculopathy, lumbar region: Secondary | ICD-10-CM | POA: Diagnosis not present

## 2020-11-29 DIAGNOSIS — R262 Difficulty in walking, not elsewhere classified: Secondary | ICD-10-CM | POA: Diagnosis not present

## 2020-11-30 DIAGNOSIS — M25512 Pain in left shoulder: Secondary | ICD-10-CM | POA: Diagnosis not present

## 2020-12-04 DIAGNOSIS — M5416 Radiculopathy, lumbar region: Secondary | ICD-10-CM | POA: Diagnosis not present

## 2020-12-04 DIAGNOSIS — M2569 Stiffness of other specified joint, not elsewhere classified: Secondary | ICD-10-CM | POA: Diagnosis not present

## 2020-12-04 DIAGNOSIS — R262 Difficulty in walking, not elsewhere classified: Secondary | ICD-10-CM | POA: Diagnosis not present

## 2020-12-04 DIAGNOSIS — R293 Abnormal posture: Secondary | ICD-10-CM | POA: Diagnosis not present

## 2020-12-04 DIAGNOSIS — M6281 Muscle weakness (generalized): Secondary | ICD-10-CM | POA: Diagnosis not present

## 2020-12-05 DIAGNOSIS — M25512 Pain in left shoulder: Secondary | ICD-10-CM | POA: Diagnosis not present

## 2020-12-06 DIAGNOSIS — M6281 Muscle weakness (generalized): Secondary | ICD-10-CM | POA: Diagnosis not present

## 2020-12-06 DIAGNOSIS — M2569 Stiffness of other specified joint, not elsewhere classified: Secondary | ICD-10-CM | POA: Diagnosis not present

## 2020-12-06 DIAGNOSIS — M5416 Radiculopathy, lumbar region: Secondary | ICD-10-CM | POA: Diagnosis not present

## 2020-12-06 DIAGNOSIS — R262 Difficulty in walking, not elsewhere classified: Secondary | ICD-10-CM | POA: Diagnosis not present

## 2020-12-06 DIAGNOSIS — R293 Abnormal posture: Secondary | ICD-10-CM | POA: Diagnosis not present

## 2020-12-07 ENCOUNTER — Other Ambulatory Visit: Payer: Self-pay | Admitting: Family Medicine

## 2020-12-07 DIAGNOSIS — F32A Depression, unspecified: Secondary | ICD-10-CM

## 2020-12-07 DIAGNOSIS — M25512 Pain in left shoulder: Secondary | ICD-10-CM | POA: Diagnosis not present

## 2020-12-07 DIAGNOSIS — R030 Elevated blood-pressure reading, without diagnosis of hypertension: Secondary | ICD-10-CM

## 2020-12-11 DIAGNOSIS — M5416 Radiculopathy, lumbar region: Secondary | ICD-10-CM | POA: Diagnosis not present

## 2020-12-11 DIAGNOSIS — M6281 Muscle weakness (generalized): Secondary | ICD-10-CM | POA: Diagnosis not present

## 2020-12-11 DIAGNOSIS — R262 Difficulty in walking, not elsewhere classified: Secondary | ICD-10-CM | POA: Diagnosis not present

## 2020-12-11 DIAGNOSIS — M2569 Stiffness of other specified joint, not elsewhere classified: Secondary | ICD-10-CM | POA: Diagnosis not present

## 2020-12-11 DIAGNOSIS — R293 Abnormal posture: Secondary | ICD-10-CM | POA: Diagnosis not present

## 2020-12-12 DIAGNOSIS — M25512 Pain in left shoulder: Secondary | ICD-10-CM | POA: Diagnosis not present

## 2020-12-13 DIAGNOSIS — M5416 Radiculopathy, lumbar region: Secondary | ICD-10-CM | POA: Diagnosis not present

## 2020-12-13 DIAGNOSIS — R262 Difficulty in walking, not elsewhere classified: Secondary | ICD-10-CM | POA: Diagnosis not present

## 2020-12-13 DIAGNOSIS — R293 Abnormal posture: Secondary | ICD-10-CM | POA: Diagnosis not present

## 2020-12-13 DIAGNOSIS — M2569 Stiffness of other specified joint, not elsewhere classified: Secondary | ICD-10-CM | POA: Diagnosis not present

## 2020-12-13 DIAGNOSIS — M6281 Muscle weakness (generalized): Secondary | ICD-10-CM | POA: Diagnosis not present

## 2020-12-17 DIAGNOSIS — M25512 Pain in left shoulder: Secondary | ICD-10-CM | POA: Diagnosis not present

## 2020-12-18 DIAGNOSIS — M6281 Muscle weakness (generalized): Secondary | ICD-10-CM | POA: Diagnosis not present

## 2020-12-18 DIAGNOSIS — M2569 Stiffness of other specified joint, not elsewhere classified: Secondary | ICD-10-CM | POA: Diagnosis not present

## 2020-12-18 DIAGNOSIS — R293 Abnormal posture: Secondary | ICD-10-CM | POA: Diagnosis not present

## 2020-12-18 DIAGNOSIS — R262 Difficulty in walking, not elsewhere classified: Secondary | ICD-10-CM | POA: Diagnosis not present

## 2020-12-18 DIAGNOSIS — M5416 Radiculopathy, lumbar region: Secondary | ICD-10-CM | POA: Diagnosis not present

## 2020-12-19 DIAGNOSIS — M25512 Pain in left shoulder: Secondary | ICD-10-CM | POA: Diagnosis not present

## 2020-12-20 DIAGNOSIS — M6281 Muscle weakness (generalized): Secondary | ICD-10-CM | POA: Diagnosis not present

## 2020-12-20 DIAGNOSIS — M5416 Radiculopathy, lumbar region: Secondary | ICD-10-CM | POA: Diagnosis not present

## 2020-12-20 DIAGNOSIS — R293 Abnormal posture: Secondary | ICD-10-CM | POA: Diagnosis not present

## 2020-12-20 DIAGNOSIS — R262 Difficulty in walking, not elsewhere classified: Secondary | ICD-10-CM | POA: Diagnosis not present

## 2020-12-20 DIAGNOSIS — M2569 Stiffness of other specified joint, not elsewhere classified: Secondary | ICD-10-CM | POA: Diagnosis not present

## 2020-12-24 DIAGNOSIS — M5416 Radiculopathy, lumbar region: Secondary | ICD-10-CM | POA: Diagnosis not present

## 2020-12-24 DIAGNOSIS — R262 Difficulty in walking, not elsewhere classified: Secondary | ICD-10-CM | POA: Diagnosis not present

## 2020-12-24 DIAGNOSIS — R293 Abnormal posture: Secondary | ICD-10-CM | POA: Diagnosis not present

## 2020-12-24 DIAGNOSIS — M2569 Stiffness of other specified joint, not elsewhere classified: Secondary | ICD-10-CM | POA: Diagnosis not present

## 2020-12-24 DIAGNOSIS — M6281 Muscle weakness (generalized): Secondary | ICD-10-CM | POA: Diagnosis not present

## 2020-12-25 DIAGNOSIS — M25512 Pain in left shoulder: Secondary | ICD-10-CM | POA: Diagnosis not present

## 2020-12-26 DIAGNOSIS — L820 Inflamed seborrheic keratosis: Secondary | ICD-10-CM | POA: Diagnosis not present

## 2020-12-26 DIAGNOSIS — D045 Carcinoma in situ of skin of trunk: Secondary | ICD-10-CM | POA: Diagnosis not present

## 2020-12-26 DIAGNOSIS — M79671 Pain in right foot: Secondary | ICD-10-CM | POA: Diagnosis not present

## 2020-12-26 DIAGNOSIS — M19071 Primary osteoarthritis, right ankle and foot: Secondary | ICD-10-CM | POA: Diagnosis not present

## 2020-12-27 DIAGNOSIS — R293 Abnormal posture: Secondary | ICD-10-CM | POA: Diagnosis not present

## 2020-12-27 DIAGNOSIS — R262 Difficulty in walking, not elsewhere classified: Secondary | ICD-10-CM | POA: Diagnosis not present

## 2020-12-27 DIAGNOSIS — M6281 Muscle weakness (generalized): Secondary | ICD-10-CM | POA: Diagnosis not present

## 2020-12-27 DIAGNOSIS — M2569 Stiffness of other specified joint, not elsewhere classified: Secondary | ICD-10-CM | POA: Diagnosis not present

## 2020-12-27 DIAGNOSIS — M5416 Radiculopathy, lumbar region: Secondary | ICD-10-CM | POA: Diagnosis not present

## 2020-12-28 DIAGNOSIS — M25512 Pain in left shoulder: Secondary | ICD-10-CM | POA: Diagnosis not present

## 2020-12-30 ENCOUNTER — Other Ambulatory Visit: Payer: Self-pay | Admitting: Family Medicine

## 2020-12-30 DIAGNOSIS — F32A Depression, unspecified: Secondary | ICD-10-CM

## 2020-12-30 DIAGNOSIS — R030 Elevated blood-pressure reading, without diagnosis of hypertension: Secondary | ICD-10-CM

## 2020-12-31 DIAGNOSIS — M5416 Radiculopathy, lumbar region: Secondary | ICD-10-CM | POA: Diagnosis not present

## 2020-12-31 DIAGNOSIS — M25512 Pain in left shoulder: Secondary | ICD-10-CM | POA: Diagnosis not present

## 2020-12-31 DIAGNOSIS — R262 Difficulty in walking, not elsewhere classified: Secondary | ICD-10-CM | POA: Diagnosis not present

## 2020-12-31 DIAGNOSIS — R293 Abnormal posture: Secondary | ICD-10-CM | POA: Diagnosis not present

## 2020-12-31 DIAGNOSIS — M2569 Stiffness of other specified joint, not elsewhere classified: Secondary | ICD-10-CM | POA: Diagnosis not present

## 2020-12-31 DIAGNOSIS — M6281 Muscle weakness (generalized): Secondary | ICD-10-CM | POA: Diagnosis not present

## 2021-01-01 DIAGNOSIS — M5459 Other low back pain: Secondary | ICD-10-CM | POA: Diagnosis not present

## 2021-01-02 DIAGNOSIS — M25512 Pain in left shoulder: Secondary | ICD-10-CM | POA: Diagnosis not present

## 2021-01-03 DIAGNOSIS — M5416 Radiculopathy, lumbar region: Secondary | ICD-10-CM | POA: Diagnosis not present

## 2021-01-03 DIAGNOSIS — R293 Abnormal posture: Secondary | ICD-10-CM | POA: Diagnosis not present

## 2021-01-03 DIAGNOSIS — R262 Difficulty in walking, not elsewhere classified: Secondary | ICD-10-CM | POA: Diagnosis not present

## 2021-01-03 DIAGNOSIS — M2569 Stiffness of other specified joint, not elsewhere classified: Secondary | ICD-10-CM | POA: Diagnosis not present

## 2021-01-03 DIAGNOSIS — M6281 Muscle weakness (generalized): Secondary | ICD-10-CM | POA: Diagnosis not present

## 2021-01-08 DIAGNOSIS — R262 Difficulty in walking, not elsewhere classified: Secondary | ICD-10-CM | POA: Diagnosis not present

## 2021-01-08 DIAGNOSIS — M5416 Radiculopathy, lumbar region: Secondary | ICD-10-CM | POA: Diagnosis not present

## 2021-01-08 DIAGNOSIS — R293 Abnormal posture: Secondary | ICD-10-CM | POA: Diagnosis not present

## 2021-01-08 DIAGNOSIS — M2569 Stiffness of other specified joint, not elsewhere classified: Secondary | ICD-10-CM | POA: Diagnosis not present

## 2021-01-08 DIAGNOSIS — M6281 Muscle weakness (generalized): Secondary | ICD-10-CM | POA: Diagnosis not present

## 2021-01-10 DIAGNOSIS — M5416 Radiculopathy, lumbar region: Secondary | ICD-10-CM | POA: Diagnosis not present

## 2021-01-10 DIAGNOSIS — R262 Difficulty in walking, not elsewhere classified: Secondary | ICD-10-CM | POA: Diagnosis not present

## 2021-01-10 DIAGNOSIS — M6281 Muscle weakness (generalized): Secondary | ICD-10-CM | POA: Diagnosis not present

## 2021-01-10 DIAGNOSIS — M2569 Stiffness of other specified joint, not elsewhere classified: Secondary | ICD-10-CM | POA: Diagnosis not present

## 2021-01-10 DIAGNOSIS — R293 Abnormal posture: Secondary | ICD-10-CM | POA: Diagnosis not present

## 2021-01-16 DIAGNOSIS — M2569 Stiffness of other specified joint, not elsewhere classified: Secondary | ICD-10-CM | POA: Diagnosis not present

## 2021-01-16 DIAGNOSIS — R262 Difficulty in walking, not elsewhere classified: Secondary | ICD-10-CM | POA: Diagnosis not present

## 2021-01-16 DIAGNOSIS — M5416 Radiculopathy, lumbar region: Secondary | ICD-10-CM | POA: Diagnosis not present

## 2021-01-16 DIAGNOSIS — R293 Abnormal posture: Secondary | ICD-10-CM | POA: Diagnosis not present

## 2021-01-16 DIAGNOSIS — M6281 Muscle weakness (generalized): Secondary | ICD-10-CM | POA: Diagnosis not present

## 2021-01-18 DIAGNOSIS — M5416 Radiculopathy, lumbar region: Secondary | ICD-10-CM | POA: Diagnosis not present

## 2021-01-18 DIAGNOSIS — R293 Abnormal posture: Secondary | ICD-10-CM | POA: Diagnosis not present

## 2021-01-18 DIAGNOSIS — R262 Difficulty in walking, not elsewhere classified: Secondary | ICD-10-CM | POA: Diagnosis not present

## 2021-01-18 DIAGNOSIS — M6281 Muscle weakness (generalized): Secondary | ICD-10-CM | POA: Diagnosis not present

## 2021-01-18 DIAGNOSIS — M2569 Stiffness of other specified joint, not elsewhere classified: Secondary | ICD-10-CM | POA: Diagnosis not present

## 2021-01-21 ENCOUNTER — Encounter: Payer: Self-pay | Admitting: Family Medicine

## 2021-01-21 ENCOUNTER — Ambulatory Visit: Payer: Medicare PPO | Admitting: Family Medicine

## 2021-01-21 ENCOUNTER — Other Ambulatory Visit: Payer: Self-pay

## 2021-01-21 VITALS — BP 126/74 | HR 54 | Temp 98.1°F | Resp 16 | Ht 65.5 in | Wt 207.2 lb

## 2021-01-21 DIAGNOSIS — E785 Hyperlipidemia, unspecified: Secondary | ICD-10-CM

## 2021-01-21 DIAGNOSIS — I10 Essential (primary) hypertension: Secondary | ICD-10-CM

## 2021-01-21 DIAGNOSIS — R030 Elevated blood-pressure reading, without diagnosis of hypertension: Secondary | ICD-10-CM

## 2021-01-21 DIAGNOSIS — E039 Hypothyroidism, unspecified: Secondary | ICD-10-CM | POA: Diagnosis not present

## 2021-01-21 DIAGNOSIS — R1900 Intra-abdominal and pelvic swelling, mass and lump, unspecified site: Secondary | ICD-10-CM

## 2021-01-21 DIAGNOSIS — G5631 Lesion of radial nerve, right upper limb: Secondary | ICD-10-CM | POA: Diagnosis not present

## 2021-01-21 DIAGNOSIS — F32A Depression, unspecified: Secondary | ICD-10-CM | POA: Diagnosis not present

## 2021-01-21 DIAGNOSIS — R208 Other disturbances of skin sensation: Secondary | ICD-10-CM

## 2021-01-21 LAB — COMPREHENSIVE METABOLIC PANEL
ALT: 24 U/L (ref 0–35)
AST: 26 U/L (ref 0–37)
Albumin: 4.2 g/dL (ref 3.5–5.2)
Alkaline Phosphatase: 76 U/L (ref 39–117)
BUN: 20 mg/dL (ref 6–23)
CO2: 27 mEq/L (ref 19–32)
Calcium: 9.9 mg/dL (ref 8.4–10.5)
Chloride: 97 mEq/L (ref 96–112)
Creatinine, Ser: 0.81 mg/dL (ref 0.40–1.20)
GFR: 71.05 mL/min (ref >60.00)
Glucose, Bld: 75 mg/dL (ref 70–99)
Potassium: 4.1 mEq/L (ref 3.5–5.1)
Sodium: 135 mEq/L (ref 135–145)
Total Bilirubin: 0.8 mg/dL (ref 0.2–1.2)
Total Protein: 7.3 g/dL (ref 6.0–8.3)

## 2021-01-21 LAB — LIPID PANEL
Cholesterol: 230 mg/dL — ABNORMAL HIGH (ref 0–200)
HDL: 77.6 mg/dL (ref >39.00)
LDL Cholesterol: 117 mg/dL — ABNORMAL HIGH (ref 0–99)
NonHDL: 152.04
Total CHOL/HDL Ratio: 3
Triglycerides: 177 mg/dL — ABNORMAL HIGH (ref 0.0–149.0)
VLDL: 35.4 mg/dL (ref 0.0–40.0)

## 2021-01-21 LAB — TSH: TSH: 1.79 u[IU]/mL (ref 0.35–4.50)

## 2021-01-21 MED ORDER — TRIAMTERENE-HCTZ 37.5-25 MG PO TABS: 1.0000 | ORAL_TABLET | Freq: Every day | ORAL | 1 refills | Status: DC

## 2021-01-21 MED ORDER — ATORVASTATIN CALCIUM 10 MG PO TABS
10.0000 mg | ORAL_TABLET | ORAL | 1 refills | Status: DC
Start: 2021-01-21 — End: 2021-07-19

## 2021-01-21 NOTE — Patient Instructions (Addendum)
I will check labs, and if needed may be able to increase lipitor to more days per week. I will let you know.   Numbness in arm hand may be radial nerve issue, or different nerve form neck. Does not appear to be carpal tunnel syndrome.   There is a slight bulge in abdomen. I would recommend against heavy lifting or straining until evaluated by surgery.   No med changes for now. recheck in 6 months.  Return to the clinic or go to the nearest emergency room if any of your symptoms worsen or new symptoms occur.

## 2021-01-21 NOTE — Progress Notes (Signed)
Subjective:  Patient ID: Elizabeth Lin, female    DOB: July 04, 1946  Age: 75 y.o. MRN: [id number]  CC:  Chief Complaint  Patient presents with  . Abdominal Pain    Pt had hernia operation, but reports has been doing exercises and feels as though her sight may have been bulging out, pt also would like to ask questions to gain better understanding.   . Shoulder Pain    Pt would like to discuss arm/shoulder pain has been progressing over a few months, would like to know what she should do for these     HPI NICHOLE NEYER presents for   Abdominal pain: Had laparoscopic repair of spigelian hernia on the right in February 2021.  Dr. Corliss Skains. Has noticed possible bulging in the area of previous hernia repair with her exercises. No pain - just feels a bulge. Does have an appt with general surgeon next month.   Shoulder/arm pain She has been under the care of [facility], Dr. Aundria Rud, and physical therapy.  Note from orthopedics reviewed from April 5, noted improvement in shoulder symptoms with physical therapy per those notes. Left shoulder is better.  R carpal tunnel syndrome - also evaluated by ortho. Notes numbness in arm with R arm down.  not sure if grasp affected - drops things in both at times. Plans to discuss with ortho. No neck pain.  Numbness into thumb and first finger. Worse with arm hanging down.   Hypertension: Triamterene HCTZ 37.5/25 mg daily.  Home readings: none.  No new side effects with meds.  Exercises as rx by PT  - HEP, and trainer 2d per week.  BP Readings from Last 3 Encounters:  01/21/21 126/74  08/02/20 120/70  07/07/20 (!) 165/87   Lab Results  Component Value Date   CREATININE 0.80 06/08/2020   Hypothyroidism: Lab Results  Component Value Date   TSH 5.090 (H) 06/08/2020  Synthroid 150 mcg daily.  Borderline TSH in September. Taking medication daily.  No new hot or cold intolerance. No new hair or skin changes, heart palpitations or new fatigue.  No new weight changes. Losing some weight with diet changes.  Wt Readings from Last 3 Encounters:  01/21/21 207 lb 3.2 oz (94 kg)  08/02/20 208 lb (94.3 kg)  06/08/20 211 lb (95.7 kg)    Hyperlipidemia: Taking lipitor 10mg  2 days per week. No new side effects on this dose.  Coffee with milk 3 hrs ago.   Lab Results  Component Value Date   CHOL 280 (H) 06/08/2020   HDL 74 06/08/2020   LDLCALC 177 (H) 06/08/2020   TRIG 159 (H) 06/08/2020   CHOLHDL 3.8 06/08/2020   Lab Results  Component Value Date   ALT 30 06/08/2020   AST 29 06/08/2020   ALKPHOS 86 06/08/2020   BILITOT 0.5 06/08/2020   Depression: On Cymbalta 30mg  qd.  Working well. Takes some naps. Not feeling depressed.  Softer stools at times, no diarrhea.  Depression screen [facility] 2/9 01/21/2021 06/08/2020 01/27/2020 12/08/2019 09/01/2019  Decreased Interest 0 2 0 0 0  Down, Depressed, Hopeless 0 0 0 0 0  PHQ - 2 Score 0 2 0 0 0  Altered sleeping - 2 - - -  Tired, decreased energy - 2 - - -  Change in appetite - 3 - - -  Feeling bad or failure about yourself  - 1 - - -  Trouble concentrating - 1 - - -  Moving slowly or fidgety/restless - 3 - - -  Suicidal thoughts - 0 - - -  PHQ-9 Score - 14 - - -  Difficult doing work/chores - - - - -      History Patient Active Problem List   Diagnosis Date Noted  . 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 Meniere disease, left 12/12/2017  . Mild aortic stenosis 11/10/2017  . Bilateral edema of lower extremity 11/10/2017  . Family history of premature CAD 11/10/2017  . Arthralgia of multiple joints 10/23/2017  . Class 1 obesity due to excess calories without serious comorbidity with body mass index (BMI) of 32.0 to 32.9 in adult 10/23/2017  . Elevated blood pressure reading 10/23/2017  . Right bundle branch block (RBBB) 10/23/2017  . Pure hypercholesterolemia 10/23/2017  . Vitamin D deficiency 09/06/2014  . Right knee pain 02/24/2013  . Hypothyroid 11/02/2011  . Depression 11/02/2011   . 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 Allergy   . Anxiety   . Arthritis   . Carpal tunnel syndrome    right hand, getting injections  . Cataract   . Depression   . Dysrhythmia    BBB  . Hammer toe   . Hypothyroidism due to Hashimoto's thyroiditis   . Meniere disease, right    Past Surgical History:  Procedure Laterality Date  . APPENDECTOMY    . COLONOSCOPY    . CYSTECTOMY     eye  . LAPAROSCOPIC ASSISTED SPIGELIAN HERNIA REPAIR N/A 11/08/2019   Procedure: LAPAROSCOPIC ASSISTED SPIGELIAN HERNIA REPAIR WITH MESH;  Surgeon: Manus Rudd, MD;  Location: [facility];  Service: General;  Laterality: N/A;  . OOPHORECTOMY Right   . TONSILLECTOMY    . TRANSTHORACIC ECHOCARDIOGRAM  10/2016   No regional wall motion normality.  GR 1 DD.  Mild aortic stenosis (mean gradient 9 mmHg).  Mild LA dilation.  November 2018   Allergies  Allergen Reactions  . Penicillins Rash    Did it involve swelling of the face/tongue/throat, SOB, or low BP? No Did it involve sudden or severe rash/hives, skin peeling, or any reaction on the inside of your mouth or nose? Yes Did you need to seek medical attention at a hospital or doctor's office? No When did it last happen?~6-10 years ago If all above answers are "NO", may proceed with cephalosporin use.   Marland Kitchen Amoxicillin-Pot Clavulanate     Red rash Did it involve swelling of the face/tongue/throat, SOB, or low BP? No Did it involve sudden or severe rash/hives, skin peeling, or any reaction on the inside of your mouth or nose? Yes Did you need to seek medical attention at a hospital or doctor's office? No When did it last happen?~6-10 years ago If all above answers are "NO", may proceed with cephalosporin use.   . Sulfa Antibiotics    Prior to Admission medications   Medication Sig Start Date End Date Taking? Authorizing Provider  atorvastatin (LIPITOR) 10 MG tablet Take 1 tablet (10 mg total) by mouth  2 (two) times a week. 09/20/20  Yes Shade Flood, MD  Calcium-Vitamin D-Vitamin K (CALCIUM + D + K PO) Take 1 tablet by mouth 2 (two) times daily. 1300 mg+D1000 IU   Yes [provider]  DULoxetine (CYMBALTA) 30 MG capsule TAKE 1 CAPSULE BY MOUTH EVERY DAY 01/01/21  Yes Shade Flood, MD  fish oil-omega-3 fatty acids 1000 MG capsule Take 1 g by mouth 2 (two) times daily.    Yes [provider]  Gluc-Chonn-MSM-Boswellia-Vit D (GLUCOSAMINE  CHOND TRIPLE/VIT D) TABS Take 1 tablet by mouth 2 (two) times daily.   Yes [provider]  levothyroxine (SYNTHROID) 150 MCG tablet Take 1 tablet (150 mcg total) by mouth daily. 10/18/20  Yes Shade FloodGreene, Klinton Candelas R, MD  naproxen (NAPROSYN) 500 MG tablet Take 1 tablet (500 mg total) by mouth 2 (two) times daily with a meal. 01/27/20  Yes Janeece AgeeMorrow, Richard, NP  naproxen sodium (ALEVE) 220 MG tablet Take 220-440 mg by mouth 2 (two) times daily as needed (pain/soreness).   Yes [provider]  Polyethyl Glycol-Propyl Glycol (LUBRICANT EYE DROPS) 0.4-0.3 % SOLN Place 1 drop into both eyes 3 (three) times daily as needed (dry/irritated eyes.). Rohto Ice Multi-Symptom Relief   Yes [provider]  triamterene-hydrochlorothiazide (MAXZIDE-25) 37.5-25 MG tablet TAKE 1 TABLET BY MOUTH EVERY DAY 01/01/21  Yes Shade FloodGreene, Kaytie Ratcliffe R, MD   Social History   Socioeconomic History  . Marital status: Divorced    Spouse name: Not on file  . Number of children: 3  . Years of education: college  . Highest education level: Bachelor's degree (e.g., BA, AB, BS)  Occupational History  . 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 Smoking status: Former Smoker    Types: Cigarettes    Quit date: 11/01/1974    Years since quitting: 46.2  . Smokeless tobacco: Never Used  Vaping Use  . Vaping Use: Never used  Substance and Sexual Activity  . Alcohol use: No    Comment: occasional wine  . Drug  use: No  . Sexual activity: Never  Other Topics Concern  . Not on file  Social History Narrative   Patient is Divorced and lives with oldest daughter and grandson.  3 children, 3 grandchildren.   Patient's  Education: [employer].  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   Patient is right handed   Caffeine consumption 2-3 daily    Walks daily for roughly 30 minutes at a time.  She has not been doing it in the wintertime due to cold weather.      Social Determinants of Health   Financial Resource Strain: Not on file  Food Insecurity: Not on file  Transportation Needs: Not on file  Physical Activity: Not on file  Stress: Not on file  Social Connections: Not on file  Intimate Partner Violence: Not on file    Review of Systems  Constitutional: Negative for fatigue and unexpected weight change.  Respiratory: Negative for chest tightness and shortness of breath.   Cardiovascular: Negative for chest pain, palpitations and leg swelling.  Gastrointestinal: Negative for abdominal pain and blood in stool.  Neurological: Negative for dizziness, syncope, light-headedness and headaches.     Objective:   Vitals:   01/21/21 1014  BP: 126/74  Pulse: (!) 54  Resp: 16  Temp: 98.1 F (36.7 C)  TempSrc: Temporal  SpO2: 98%  Weight: 207 lb 3.2 oz (94 kg)  Height: 5' 5.5" (1.664 m)     Physical Exam Vitals reviewed.  Constitutional:      Appearance: She is well-developed.  HENT:     Head: Normocephalic and atraumatic.  Eyes:     Conjunctiva/sclera: Conjunctivae normal.     Pupils: Pupils are equal, round, and reactive to light.  Neck:     Vascular: No carotid bruit.  Cardiovascular:     Rate and Rhythm: Normal rate and regular rhythm.  Heart sounds: Normal heart sounds.  Pulmonary:     Effort: Pulmonary effort is normal.     Breath sounds: Normal breath sounds.  Abdominal:     Palpations:  Abdomen is soft. There is no pulsatile mass.     Tenderness: There is no abdominal tenderness.     Comments: Firm area/bulge with Valsalva/sit up in the right lower quadrant, nontender.  Skin intact.  Musculoskeletal:     Comments: C-spine, slight decreased range of motion with rotation, flexion, extension but does not reproduce right arm dysesthesias.  Negative Tinel over right brachial plexus Equal shoulder range of motion, does not reproduce arm symptoms. Right elbow full range of motion, slight increased dysesthesias with Tinel's over the radial tunnel, increased dysesthesias with full extension of elbow and arm hanging downward. Negative Tinel, Phalen at wrist/median nerve.  Describes area of dysesthesias as the dorsal thumb and second phalanx.  Middle finger uninvolved.  Equal grip strength bilaterally without appreciable weakness at hand/wrist.  Skin:    General: Skin is warm and dry.  Neurological:     Mental Status: She is alert and oriented to person, place, and time.  Psychiatric:        Mood and Affect: Mood normal.        Behavior: Behavior normal.        Assessment & Plan:  KELDA AZAD is a 75 y.o. female . Depression, unspecified depression type  -Stable, continue same regimen.  Hyperlipidemia, unspecified hyperlipidemia type - Plan: Comprehensive metabolic panel, Lipid panel, atorvastatin (LIPITOR) 10 MG tablet  -Tolerating intermittent dosing, check labs, consider increased frequency of Lipitor.  Hypothyroidism, unspecified type - Plan: TSH  -Borderline TSH previously, check TSH, no med changes for now  Hypertension, unspecified type Elevated blood pressure reading - Plan: triamterene-hydrochlorothiazide (MAXZIDE-25) 37.5-25 MG tablet  -No change in meds for now, RTC precautions if new side effects/symptoms  Abdominal wall bulge  -Prior spigelian hernia repair, recommended follow-up as scheduled with surgeon for further evaluation and to avoid heavy  lifting/straining for now.  Dysesthesia Neuropathy of right radial nerve - Plan: Ambulatory referral to Hand Surgery  -Exam/symptoms suspicious for radial neuropathy.  Will refer to hand specialist for further evaluation, and to decide on electrodiagnostics if needed.   Meds ordered this encounter  Medications  . triamterene-hydrochlorothiazide (MAXZIDE-25) 37.5-25 MG tablet    Sig: Take 1 tablet by mouth daily.    Dispense:  90 tablet    Refill:  1  . atorvastatin (LIPITOR) 10 MG tablet    Sig: Take 1 tablet (10 mg total) by mouth 2 (two) times a week.    Dispense:  90 tablet    Refill:  1   Patient Instructions  I will check labs, and if needed may be able to increase lipitor to more days per week. I will let you know.   Numbness in arm hand may be radial nerve issue, or different nerve form neck. Does not appear to be carpal tunnel syndrome.   There is a slight bulge in abdomen. I would recommend against heavy lifting or straining until evaluated by surgery.   No med changes for now. recheck in 6 months.  Return to the clinic or go to the nearest emergency room if any of your symptoms worsen or new symptoms occur.      Signed, Meredith Staggers, MD [facility]

## 2021-01-22 DIAGNOSIS — M5416 Radiculopathy, lumbar region: Secondary | ICD-10-CM | POA: Diagnosis not present

## 2021-01-22 DIAGNOSIS — R293 Abnormal posture: Secondary | ICD-10-CM | POA: Diagnosis not present

## 2021-01-22 DIAGNOSIS — R262 Difficulty in walking, not elsewhere classified: Secondary | ICD-10-CM | POA: Diagnosis not present

## 2021-01-22 DIAGNOSIS — M2569 Stiffness of other specified joint, not elsewhere classified: Secondary | ICD-10-CM | POA: Diagnosis not present

## 2021-01-22 DIAGNOSIS — M6281 Muscle weakness (generalized): Secondary | ICD-10-CM | POA: Diagnosis not present

## 2021-01-24 DIAGNOSIS — M6281 Muscle weakness (generalized): Secondary | ICD-10-CM | POA: Diagnosis not present

## 2021-01-24 DIAGNOSIS — M2569 Stiffness of other specified joint, not elsewhere classified: Secondary | ICD-10-CM | POA: Diagnosis not present

## 2021-01-24 DIAGNOSIS — R293 Abnormal posture: Secondary | ICD-10-CM | POA: Diagnosis not present

## 2021-01-24 DIAGNOSIS — M5416 Radiculopathy, lumbar region: Secondary | ICD-10-CM | POA: Diagnosis not present

## 2021-01-24 DIAGNOSIS — R262 Difficulty in walking, not elsewhere classified: Secondary | ICD-10-CM | POA: Diagnosis not present

## 2021-02-04 ENCOUNTER — Other Ambulatory Visit: Payer: Self-pay | Admitting: Surgery

## 2021-02-04 DIAGNOSIS — R1031 Right lower quadrant pain: Secondary | ICD-10-CM

## 2021-02-11 ENCOUNTER — Ambulatory Visit (INDEPENDENT_AMBULATORY_CARE_PROVIDER_SITE_OTHER): Payer: Medicare PPO

## 2021-02-11 ENCOUNTER — Telehealth: Payer: Self-pay

## 2021-02-11 VITALS — Ht 65.0 in | Wt 207.0 lb

## 2021-02-11 DIAGNOSIS — Z0000 Encounter for general adult medical examination without abnormal findings: Secondary | ICD-10-CM

## 2021-02-11 DIAGNOSIS — E669 Obesity, unspecified: Secondary | ICD-10-CM

## 2021-02-11 DIAGNOSIS — Z6834 Body mass index (BMI) 34.0-34.9, adult: Secondary | ICD-10-CM

## 2021-02-11 NOTE — Telephone Encounter (Signed)
During Medicare Wellness exam, patient is requesting a referral to a nutritionist for weight loss. She says she has tried losing weight on her own without much success. She has heard there is a program at the new [facility].

## 2021-02-11 NOTE — Patient Instructions (Signed)
Elizabeth Lin , Thank you for taking time to come for your Medicare Wellness Visit. I appreciate your ongoing commitment to your health goals. Please review the following plan we discussed and let me know if I can assist you in the future.   Screening recommendations/referrals: Colonoscopy: No longer required Mammogram: Per our conversation, you will bring a copy of your most recent results to your next office visit. Bone Density: Per our conversation, you will bring a copy of your most recent results to your next office visit. Recommended yearly ophthalmology/optometry visit for glaucoma screening and checkup Recommended yearly dental visit for hygiene and checkup  Vaccinations: Influenza vaccine: Up to date Pneumococcal vaccine: Pneumovax 23 due-Discuss with PCP at next visit Tdap vaccine: Up to date-Due-03/16/2022 Shingles vaccine: Discuss with pharmacy Covid-19:Booster due-Discuss with pharmacy  Advanced directives: Please bring a copy for your chart  Conditions/risks identified: See problem list  Next appointment: Follow up in one year for your annual wellness visit    Preventive Care 76 Years and Older, Female Preventive care refers to lifestyle choices and visits with your health care provider that can promote health and wellness. What does preventive care include?  A yearly physical exam. This is also called an annual well check.  Dental exams once or twice a year.  Routine eye exams. Ask your health care provider how often you should have your eyes checked.  Personal lifestyle choices, including:  Daily care of your teeth and gums.  Regular physical activity.  Eating a healthy diet.  Avoiding tobacco and drug use.  Limiting alcohol use.  Practicing safe sex.  Taking low-dose aspirin every day.  Taking vitamin and mineral supplements as recommended by your health care provider. What happens during an annual well check? The services and screenings done by  your health care provider during your annual well check will depend on your age, overall health, lifestyle risk factors, and family history of disease. Counseling  Your health care provider may ask you questions about your:  Alcohol use.  Tobacco use.  Drug use.  Emotional well-being.  Home and relationship well-being.  Sexual activity.  Eating habits.  History of falls.  Memory and ability to understand (cognition).  Work and work Astronomer.  Reproductive health. Screening  You may have the following tests or measurements:  Height, weight, and BMI.  Blood pressure.  Lipid and cholesterol levels. These may be checked every 5 years, or more frequently if you are over 63 years old.  Skin check.  Lung cancer screening. You may have this screening every year starting at age 71 if you have a 30-pack-year history of smoking and currently smoke or have quit within the past 15 years.  Fecal occult blood test (FOBT) of the stool. You may have this test every year starting at age 84.  Flexible sigmoidoscopy or colonoscopy. You may have a sigmoidoscopy every 5 years or a colonoscopy every 10 years starting at age 69.  Hepatitis C blood test.  Hepatitis B blood test.  Sexually transmitted disease (STD) testing.  Diabetes screening. This is done by checking your blood sugar (glucose) after you have not eaten for a while (fasting). You may have this done every 1-3 years.  Bone density scan. This is done to screen for osteoporosis. You may have this done starting at age 60.  Mammogram. This may be done every 1-2 years. Talk to your health care provider about how often you should have regular mammograms. Talk with your health care provider  about your test results, treatment options, and if necessary, the need for more tests. Vaccines  Your health care provider may recommend certain vaccines, such as:  Influenza vaccine. This is recommended every year.  Tetanus,  diphtheria, and acellular pertussis (Tdap, Td) vaccine. You may need a Td booster every 10 years.  Zoster vaccine. You may need this after age 9.  Pneumococcal 13-valent conjugate (PCV13) vaccine. One dose is recommended after age 104.  Pneumococcal polysaccharide (PPSV23) vaccine. One dose is recommended after age 12. Talk to your health care provider about which screenings and vaccines you need and how often you need them. This information is not intended to replace advice given to you by your health care provider. Make sure you discuss any questions you have with your health care provider. Document Released: 10/12/2015 Document Revised: 06/04/2016 Document Reviewed: 07/17/2015 Elsevier Interactive Patient Education  2017 [place] Prevention in the Home Falls can cause injuries. They can happen to people of all ages. There are many things you can do to make your home safe and to help prevent falls. What can I do on the outside of my home?  Regularly fix the edges of walkways and driveways and fix any cracks.  Remove anything that might make you trip as you walk through a door, such as a raised step or threshold.  Trim any bushes or trees on the path to your home.  Use bright outdoor lighting.  Clear any walking paths of anything that might make someone trip, such as rocks or tools.  Regularly check to see if handrails are loose or broken. Make sure that both sides of any steps have handrails.  Any raised decks and porches should have guardrails on the edges.  Have any leaves, snow, or ice cleared regularly.  Use sand or salt on walking paths during winter.  Clean up any spills in your garage right away. This includes oil or grease spills. What can I do in the bathroom?  Use night lights.  Install grab bars by the toilet and in the tub and shower. Do not use towel bars as grab bars.  Use non-skid mats or decals in the tub or shower.  If you need to sit down in  the shower, use a plastic, non-slip stool.  Keep the floor dry. Clean up any water that spills on the floor as soon as it happens.  Remove soap buildup in the tub or shower regularly.  Attach bath mats securely with double-sided non-slip rug tape.  Do not have throw rugs and other things on the floor that can make you trip. What can I do in the bedroom?  Use night lights.  Make sure that you have a light by your bed that is easy to reach.  Do not use any sheets or blankets that are too big for your bed. They should not hang down onto the floor.  Have a firm chair that has side arms. You can use this for support while you get dressed.  Do not have throw rugs and other things on the floor that can make you trip. What can I do in the kitchen?  Clean up any spills right away.  Avoid walking on wet floors.  Keep items that you use a lot in easy-to-reach places.  If you need to reach something above you, use a strong step stool that has a grab bar.  Keep electrical cords out of the way.  Do not use floor polish or  wax that makes floors slippery. If you must use wax, use non-skid floor wax.  Do not have throw rugs and other things on the floor that can make you trip. What can I do with my stairs?  Do not leave any items on the stairs.  Make sure that there are handrails on both sides of the stairs and use them. Fix handrails that are broken or loose. Make sure that handrails are as long as the stairways.  Check any carpeting to make sure that it is firmly attached to the stairs. Fix any carpet that is loose or worn.  Avoid having throw rugs at the top or bottom of the stairs. If you do have throw rugs, attach them to the floor with carpet tape.  Make sure that you have a light switch at the top of the stairs and the bottom of the stairs. If you do not have them, ask someone to add them for you. What else can I do to help prevent falls?  Wear shoes that:  Do not have high  heels.  Have rubber bottoms.  Are comfortable and fit you well.  Are closed at the toe. Do not wear sandals.  If you use a stepladder:  Make sure that it is fully opened. Do not climb a closed stepladder.  Make sure that both sides of the stepladder are locked into place.  Ask someone to hold it for you, if possible.  Clearly mark and make sure that you can see:  Any grab bars or handrails.  First and last steps.  Where the edge of each step is.  Use tools that help you move around (mobility aids) if they are needed. These include:  Canes.  Walkers.  Scooters.  Crutches.  Turn on the lights when you go into a dark area. Replace any light bulbs as soon as they burn out.  Set up your furniture so you have a clear path. Avoid moving your furniture around.  If any of your floors are uneven, fix them.  If there are any pets around you, be aware of where they are.  Review your medicines with your doctor. Some medicines can make you feel dizzy. This can increase your chance of falling. Ask your doctor what other things that you can do to help prevent falls. This information is not intended to replace advice given to you by your health care provider. Make sure you discuss any questions you have with your health care provider. Document Released: 07/12/2009 Document Revised: 02/21/2016 Document Reviewed: 10/20/2014 Elsevier Interactive Patient Education  2017 [employer].

## 2021-02-11 NOTE — Progress Notes (Signed)
Subjective:   Elizabeth Lin is a 75 y.o. female who presents for Medicare Annual (Subsequent) preventive examination.  I connected with Elizabeth Lin today by telephone and verified that I am speaking with the correct person using two identifiers. Location patient: home Location provider: work Persons participating in the virtual visit: patient, Engineer, civil (consulting).    I discussed the limitations, risks, security and privacy concerns of performing an evaluation and management service by telephone and the availability of in person appointments. I also discussed with the patient that there may be a patient responsible charge related to this service. The patient expressed understanding and verbally consented to this telephonic visit.    Interactive audio and video telecommunications were attempted between this provider and patient, however failed, due to patient having technical difficulties OR patient did not have access to video capability.  We continued and completed visit with audio only.  Some vital signs may be absent or patient reported.   Time Spent with patient on telephone encounter: 45 minutes   Review of Systems     Cardiac Risk Factors include: advanced age (>14men, >70 women);dyslipidemia;hypertension     Objective:    Today's Vitals   02/11/21 1545  Weight: 207 lb (93.9 kg)  Height: 5\' 5"  (1.651 m)   Body mass index is 34.45 kg/m.  Advanced Directives 02/11/2021 11/08/2019 11/03/2019 08/29/2019  Does Patient Have a Medical Advance Directive? Yes Yes Yes No  Type of 08/31/2019 of [place];Living will Healthcare Power of [place];Living will Healthcare Power of [place];Living will -  Does patient want to make changes to medical advance directive? - No - Patient declined - -  Copy of Healthcare Power of Attorney in Chart? No - copy requested No - copy requested - -  Would patient like information on creating a medical advance directive? - - - Yes (Inpatient -  patient defers creating a medical advance directive at this time - Information given)    Current Medications (verified) Outpatient Encounter Medications as of 02/11/2021  Medication Sig  . atorvastatin (LIPITOR) 10 MG tablet Take 1 tablet (10 mg total) by mouth 2 (two) times a week.  . Calcium-Vitamin D-Vitamin K (CALCIUM + D + K PO) Take 1 tablet by mouth 2 (two) times daily. 1300 mg+D1000 IU  . DULoxetine (CYMBALTA) 30 MG capsule TAKE 1 CAPSULE BY MOUTH EVERY DAY  . fish oil-omega-3 fatty acids 1000 MG capsule Take 1 g by mouth 2 (two) times daily.   . Gluc-Chonn-MSM-Boswellia-Vit D (GLUCOSAMINE CHOND TRIPLE/VIT D) TABS Take 1 tablet by mouth 2 (two) times daily.  02/13/2021 levothyroxine (SYNTHROID) 150 MCG tablet Take 1 tablet (150 mcg total) by mouth daily.  . naproxen (NAPROSYN) 500 MG tablet Take 1 tablet (500 mg total) by mouth 2 (two) times daily with a meal.  . naproxen sodium (ALEVE) 220 MG tablet Take 220-440 mg by mouth 2 (two) times daily as needed (pain/soreness).  Marland Kitchen Glycol-Propyl Glycol (LUBRICANT EYE DROPS) 0.4-0.3 % SOLN Place 1 drop into both eyes 3 (three) times daily as needed (dry/irritated eyes.). Rohto Ice Multi-Symptom Relief  . triamterene-hydrochlorothiazide (MAXZIDE-25) 37.5-25 MG tablet Take 1 tablet by mouth daily.   No facility-administered encounter medications on file as of 02/11/2021.    Allergies (verified) Penicillins, Amoxicillin-pot clavulanate, and Sulfa antibiotics   History: Past Medical History:  Diagnosis Date  . Allergy   . Anxiety   . Arthritis   . Carpal tunnel syndrome    right hand, getting injections  .  Cataract   . Depression   . Dysrhythmia    BBB  . Hammer toe   . Hypothyroidism due to Hashimoto's thyroiditis   . Meniere disease, right    Past Surgical History:  Procedure Laterality Date  . APPENDECTOMY    . COLONOSCOPY    . CYSTECTOMY     eye  . LAPAROSCOPIC ASSISTED SPIGELIAN HERNIA REPAIR N/A 11/08/2019   Procedure:  LAPAROSCOPIC ASSISTED SPIGELIAN HERNIA REPAIR WITH MESH;  Surgeon: Manus Ruddsuei, Matthew, MD;  Location: [facility];  Service: General;  Laterality: N/A;  . OOPHORECTOMY Right   . TONSILLECTOMY    . TRANSTHORACIC ECHOCARDIOGRAM  10/2016   No regional wall motion normality.  GR 1 DD.  Mild aortic stenosis (mean gradient 9 mmHg).  Mild LA dilation.  November 2018   Family History  Problem Relation Age of Onset  . Diabetes Father   . Myasthenia gravis Father        Died from complications in his 8650s  . CAD Father        Was too sick with myasthenia gravis to have CABG  . Hypertension Father   . Thyroid disease Mother   . Cancer Mother        Lung -died at age 38-1/2.  Marland Kitchen. Depression Mother   . Mental illness Mother   . Hypertension Brother        Since age 76  . Hyperlipidemia Brother   . Thyroid disease Daughter   . Depression Son   . Heart disease Maternal Grandmother   . Heart disease Paternal Grandmother   . Heart disease Paternal Grandfather   . Colon cancer Neg Hx    Social History   Socioeconomic History  . Marital status: Divorced    Spouse name: Not on file  . Number of children: 3  . Years of education: college  . Highest education level: Bachelor's degree (e.g., BA, AB, BS)  Occupational History  . 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 Smoking status: Former Smoker    Types: Cigarettes    Quit date: 11/01/1974    Years since quitting: 46.3  . Smokeless tobacco: Never Used  Vaping Use  . Vaping Use: Never used  Substance and Sexual Activity  . Alcohol use: No    Comment: occasional wine  . Drug use: No  . Sexual activity: Never  Other Topics Concern  . Not on file  Social History Narrative   Patient is Divorced and lives with oldest daughter and grandson.  3 children, 3 grandchildren.   Patient's  Education: [employer].  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   Patient is right handed   Caffeine consumption 2-3 daily    Walks daily for roughly 30 minutes at a time.  She has not been doing it in the wintertime due to cold weather.      Social Determinants of Health   Financial Resource Strain: Low Risk   . Difficulty of Paying Living Expenses: Not hard at all  Food Insecurity: No Food Insecurity  . Worried About Programme researcher, broadcasting/film/videounning Out of Food in the Last Year: Never true  . Ran Out of Food in the Last Year: Never true  Transportation Needs: No Transportation Needs  . Lack of Transportation (Medical): No  . Lack of Transportation (Non-Medical): No  Physical Activity: Sufficiently Active  . Days of Exercise per Week: 5  days  . Minutes of Exercise per Session: 60 min  Stress: No Stress Concern Present  . Feeling of Stress : Only a little  Social Connections: Moderately Isolated  . Frequency of Communication with Friends and Family: More than three times a week  . Frequency of Social Gatherings with Friends and Family: More than three times a week  . Attends Religious Services: More than 4 times per year  . Active Member of Clubs or Organizations: No  . Attends Banker Meetings: Never  . Marital Status: Divorced    Tobacco Counseling Counseling given: Not Answered   Clinical Intake:  Pre-visit preparation completed: No  Pain : No/denies pain     Nutritional Status: BMI > 30  Obese Nutritional Risks: None Diabetes: No  How often do you need to have someone help you when you read instructions, pamphlets, or other written materials from your doctor or pharmacy?: 1 - Never  Diabetic?No  Interpreter Needed?: No  Information entered by :: Thomasenia Sales LPN   Activities of Daily Living In your present state of health, do you have any difficulty performing the following activities: 02/11/2021  Hearing? Y  Comment hearing aids  Vision? N  Difficulty concentrating or making decisions? N   Walking or climbing stairs? N  Dressing or bathing? N  Doing errands, shopping? N  Preparing Food and eating ? N  Using the Toilet? N  In the past six months, have you accidently leaked urine? Y  Comment occasionally at night  Do you have problems with loss of bowel control? N  Managing your Medications? N  Managing your Finances? N  Housekeeping or managing your Housekeeping? N  Some recent data might be hidden    Patient Care Team: Shade Flood, MD as PCP - General (Family Medicine) Candice Camp, MD as Consulting Physician (Obstetrics and Gynecology)  Indicate any recent Medical Services you may have received from other than [facility] providers in the past year (date may be approximate).     Assessment:   This is a routine wellness examination for Elizabeth Lin.  Hearing/Vision screen  Hearing Screening   125Hz  250Hz  500Hz  1000Hz  2000Hz  3000Hz  4000Hz  6000Hz  8000Hz   Right ear:           Left ear:           Comments: Bilateral hearing aids  Vision Screening Comments: Wears glasses Last eye exam-08/2020-[facility]   Dietary issues and exercise activities discussed: Current Exercise Habits: Home exercise routine, Type of exercise: stretching;strength training/weights, Time (Minutes): 60, Frequency (Times/Week): 5, Weekly Exercise (Minutes/Week): 300, Intensity: Mild, Exercise limited by: None identified  Goals Addressed            This Visit's Progress   . Patient Stated   Not on track    Try to loose some weight. Exercise more       Depression Screen PHQ 2/9 Scores 02/11/2021 01/21/2021 06/08/2020 01/27/2020 12/08/2019 09/01/2019 08/29/2019  PHQ - 2 Score 0 0 2 0 0 0 0  PHQ- 9 Score - - 14 - - - -    Fall Risk Fall Risk  02/11/2021 01/21/2021 06/08/2020 01/27/2020 12/08/2019  Falls in the past year? 0 0 0 0 0  Number falls in past yr: 0 - - 0 -  Injury with Fall? 0 - - 0 -  Follow up Falls prevention discussed Falls evaluation completed Falls evaluation completed  Falls evaluation completed Falls evaluation completed    FALL RISK PREVENTION PERTAINING TO  THE HOME:  Any stairs in or around the home? Yes  If so, are there any without handrails? No  Home free of loose throw rugs in walkways, pet beds, electrical cords, etc? Yes  Adequate lighting in your home to reduce risk of falls? Yes   ASSISTIVE DEVICES UTILIZED TO PREVENT FALLS:  Life alert? No  Use of a cane, walker or w/c? No  Grab bars in the bathroom? No  Shower chair or bench in shower? No  Elevated toilet seat or a handicapped toilet? No   TIMED UP AND GO:  Was the test performed? No . Phone visit   Cognitive Function:Normal cognitive status assessed by  this Nurse Health Advisor. No abnormalities found.       6CIT Screen 08/29/2019  What Year? 0 points  What month? 0 points  What time? 0 points  Count back from 20 0 points  Months in reverse 0 points  Repeat phrase 6 points  Total Score 6    Immunizations Immunization History  Administered Date(s) Administered  . Influenza, Seasonal, Injecte, Preservative Fre 11/05/2012  . Influenza,inj,Quad PF,6+ Mos 06/21/2013, 09/01/2014  . Influenza-Unspecified 06/21/2016, 06/15/2017  . PFIZER(Purple Top)SARS-COV-2 Vaccination 05/30/2020, 06/03/2020  . Pneumococcal Conjugate-13 09/04/2016  . Tdap 03/16/2012    TDAP status: Up to date  Flu Vaccine status: Up to date  Pneumococcal vaccine status: Due, Education has been provided regarding the importance of this vaccine. Advised may receive this vaccine at local pharmacy or Health Dept. Aware to provide a copy of the vaccination record if obtained from local pharmacy or Health Dept. Verbalized acceptance and understanding.  Covid-19 vaccine status: Information provided on how to obtain vaccines.  Booster due  Qualifies for Shingles Vaccine? Yes   Zostavax completed No   Shingrix Completed?: No.    Education has been provided regarding the importance of this vaccine. Patient  has been advised to call insurance company to determine out of pocket expense if they have not yet received this vaccine. Advised may also receive vaccine at local pharmacy or Health Dept. Verbalized acceptance and understanding.  Screening Tests Health Maintenance  Topic Date Due  . PNA vac Low Risk Adult (2 of 2 - PPSV23) 09/04/2017  . COVID-19 Vaccine (3 - Booster) 11/03/2020  . INFLUENZA VACCINE  04/29/2021  . TETANUS/TDAP  03/16/2022  . COLONOSCOPY (Pts 45-67yrs Insurance coverage will need to be confirmed)  02/10/2029  . DEXA SCAN  Completed  . Hepatitis C Screening  Completed  . HPV VACCINES  Aged Out    Health Maintenance  Health Maintenance Due  Topic Date Due  . PNA vac Low Risk Adult (2 of 2 - PPSV23) 09/04/2017  . COVID-19 Vaccine (3 - Booster) 11/03/2020    Colorectal cancer screening: No longer required.   Mammogram status: Patient states she has has done in the last year. Date & place unknown. She will bring report to her next visit.    Bone Density status:Patient states she has has done in the last 2 years. Date & place unknown. She will bring report to her next visit.     Lung Cancer Screening: (Low Dose CT Chest recommended if Age 77-80 years, 30 pack-year currently smoking OR have quit w/in 15years.) does not qualify.     Additional Screening:  Hepatitis C Screening:Completed 09/04/2016  Vision Screening: Recommended annual ophthalmology exams for early detection of glaucoma and other disorders of the eye. Is the patient up to date with their annual eye exam?  Yes  Who is the provider or what is the name of the office in which the patient attends annual eye exams? [facility]    Dental Screening: Recommended annual dental exams for proper oral hygiene  Community Resource Referral / Chronic Care Management: CRR required this visit?  No   CCM required this visit?  No      Plan:     I have personally reviewed and noted the following  in the patient's chart:   . Medical and social history . Use of alcohol, tobacco or illicit drugs  . Current medications and supplements including opioid prescriptions.  . Functional ability and status . Nutritional status . Physical activity . Advanced directives . List of other physicians . Hospitalizations, surgeries, and ER visits in previous 12 months . Vitals . Screenings to include cognitive, depression, and falls . Referrals and appointments  In addition, I have reviewed and discussed with patient certain preventive protocols, quality metrics, and best practice recommendations. A written personalized care plan for preventive services as well as general preventive health recommendations were provided to patient.  Due to this being a telephonic visit, the after visit summary with patients personalized plan was offered to patient via mail or my-chart. Patient would like to access on my-chart.     Roanna Raider, LPN   [id number]/60/[id number]  Nurse Health Advisor  Nurse Notes: Patient is requesting a referral to a nutritionist for weight loss. Message sent to PCP.

## 2021-02-12 NOTE — Addendum Note (Signed)
Addended by: Meredith Staggers R on: 02/12/2021 11:18 AM   Modules accepted: Orders

## 2021-02-20 ENCOUNTER — Ambulatory Visit
Admission: RE | Admit: 2021-02-20 | Discharge: 2021-02-20 | Disposition: A | Discharge status: Home / Self Care | Payer: Medicare PPO | Source: Ambulatory Visit | Attending: Surgery | Admitting: Surgery

## 2021-02-20 ENCOUNTER — Other Ambulatory Visit: Payer: Self-pay

## 2021-02-20 DIAGNOSIS — R1031 Right lower quadrant pain: Secondary | ICD-10-CM

## 2021-02-22 ENCOUNTER — Ambulatory Visit: Payer: Self-pay | Admitting: Surgery

## 2021-02-22 NOTE — H&P (Signed)
History of Present Illness Elizabeth Lin. Elizabeth Cudworth MD; 02/22/2021 1:Elizabeth PM) The patient is a 75 year old female who presents with an abdominal wall hernia. Referred by Dr. Meredith Staggers for RLQ hernia  The patient is accompanied by her daughter Elizabeth Lin.  This is a 75 year old female with a history of Mnire's disease who presents with approximately 6-7 months of fullness and firmness in her right lower quadrant. She has begun to notice a change in the consistency of her stools. She denies constipation but notices that her stools are smaller and much softer than usual. She also reports some upper abdominal bloating and frequent mild nausea. She denies any vomiting. She has a past abdominal surgical history of laparoscopic appendectomy and right nephrectomy performed approximately 15 years ago at [facility]. We do not have those records. Recently she underwent CT scan that showed what appears to be a right spigelian hernia. The hernia sac seems to contain part of her right colon as well as some small bowel. This is not a full-thickness hernia as the overlying external oblique muscle seems to be intact. The patient had a CT scan performed in December 2015. Retrospective review that scan shows that she had a very small spigelian hernia at that time. The hernia has enlarged significantly in the last 5 years. She is s/p laparoscopic repair of a Spigelian hernia in February 2021. She was doing well , but over the last several weeks, she has noted some fullness and bulging to the right side of the abdominal wall. No pain. No tenderness. No obstructive symptoms.   A CT scan was obtained yesterday what appears to be a recurrence of her spigelian hernia. When I examine images, I can see the mesh that seems to be pulled loose from the upper side of the hernia defect and is now attached only at the lower wall.  CLINICAL DATA: Right lower quadrant pain, bulge, question hernia. Previous laparoscopic  assisted spigelian hernia repair.  EXAM: CT ABDOMEN AND PELVIS WITHOUT CONTRAST  TECHNIQUE: Multidetector CT imaging of the abdomen and pelvis was performed following the standard protocol without IV contrast.  COMPARISON: 09/16/2019  FINDINGS: Lower chest: No acute abnormality.  Hepatobiliary: No focal liver abnormality is seen. Status post cholecystectomy. No biliary dilatation.  Pancreas: No focal abnormality or ductal dilatation.  Spleen: No focal abnormality. Normal size.  Adrenals/Urinary Tract: No adrenal abnormality. No focal renal abnormality. No stones or hydronephrosis. Urinary bladder is unremarkable.  Stomach/Bowel: Stomach, large and small bowel grossly unremarkable. The cecum, terminal ileum are seen within a right lower abdominal wall spigelian hernia, similar to prior study. No obstruction.  Vascular/Lymphatic: No evidence of aneurysm or adenopathy.  Reproductive: Uterus and adnexa unremarkable. No mass.  Other: No free fluid or free air.  Musculoskeletal: No acute bony abnormality.  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 No significant change since prior study.   Electronically Signed By: Elizabeth Lin M.D. On: 02/21/2021 13:52    Problem List/Past Medical Elizabeth Hazard Lin. 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 Leesha Veno, MD; 02/22/2021 1:Elizabeth PM) Colon Polyp Removal - Colonoscopy Colon Polyp Removal - Open Tonsillectomy  Diagnostic Studies History Elizabeth Lin. Elizabeth Hobbins, MD; 02/22/2021 1:Elizabeth PM) Colonoscopy 1-5 years ago Mammogram 1-3 years ago  Allergies Elizabeth Bickers, LPN; 0/25/4270 62:37 AM) Penicillins Sulfa Drugs Allergies Reconciled  Medication History Elizabeth Bickers, LPN; 03/26/3150 76:16 AM) Atorvastatin Calcium (10MG  Tablet,  Oral) Active. Aspirin (81MG   Tablet, Oral) Active. Calcium Carbonate-Vitamin D (600-200MG -UNIT Capsule, Oral) Active. Fish Oil (1000MG  Capsule, Oral) Active. Glucosamine 1500 Complex (Oral) Active. Naproxen (250MG  Tablet, Oral as needed) Active. Levothyroxine Sodium ( Tablet, Oral) Active. DULoxetine HCl (30MG  Capsule DR Part, Oral) Active. Triamterene-HCTZ (37.5-25MG  Tablet, Oral) Active. Medications Reconciled  Social History . Elizabeth Alto, MD; 02/22/2021 1:Elizabeth PM) Caffeine use Coffee.  Family History . Elizabeth Nakatani, MD; 02/22/2021 1:Elizabeth PM) Arthritis Mother. Heart Disease Father. Hypertension Brother. Thyroid problems Mother.  Pregnancy / Birth History Elizabeth Lin. Elizabeth Bugarin, MD; 02/22/2021 1:Elizabeth PM) Age at menarche 13 years. Age of menopause 86-60 Gravida 26 Maternal age 77-25 Para 3  Other Problems 02/24/2021. Elizabeth Bunn, MD; 02/22/2021 1:Elizabeth PM) Anxiety Disorder Arthritis Oophorectomy Right.     Physical Exam Elizabeth Lin. Sage Hammill MD; 02/22/2021 1:11 PM)  The physical exam findings are as follows: Note:Constitutional: WDWN in NAD, conversant, no obvious deformities; resting comfortably Eyes: Pupils equal, round; sclera anicteric; moist conjunctiva; no lid lag HENT: Oral mucosa moist; good dentition Neck: No masses palpated, trachea midline; no thyromegaly Lungs: CTA bilaterally; normal respiratory effort CV: Regular rate and rhythm; no murmurs; extremities well-perfused with no edema Abd: +bowel sounds, soft, non-tender, no palpable organomegaly; healed laparoscopic incisions; palpable hernia in RLQ - partially reducible when supine Musc: Normal gait; no apparent clubbing or cyanosis in extremities Lymphatic: No palpable cervical or axillary lymphadenopathy Skin: Warm, dry; no sign of jaundice Psychiatric - alert and oriented x 4; calm mood and affect    Assessment & Plan Elizabeth Lin Lin. Keldrick Pomplun MD; 02/22/2021 Elizabeth:53 AM)  Elizabeth Hazard HERNIA (K43.9)  Current Plans Schedule for  Surgery - Open repair of recurrent Spigelian hernia with mesh. The surgical procedure has been discussed with the patient. Potential risks, benefits, alternative treatments, and expected outcomes have been explained. All of the patient's questions at this time have been answered. The likelihood of reaching the patient's treatment goal is good. The patient understand the proposed surgical procedure and wishes to proceed.  02/24/2021. Elizabeth Hazard, MD, [facility]  General/ Trauma Surgery   02/22/2021 1:11 PM

## 2021-03-15 IMAGING — US US PELVIS LIMITED
1 series · 5 of 5 positions shown · non-contrast
Comparison: CT abdomen pelvis dated September 12, 2014.

CLINICAL DATA: Right lower quadrant fullness.  Evaluate for hernia.

EXAM:
LIMITED ULTRASOUND OF PELVIS
TECHNIQUE: Limited transabdominal ultrasound examination of the pelvis was
performed.

[Series 1: us pelvis limited · 0.10mm/px · 5 acquisitions, 5 frames shown]
[im 1/5]
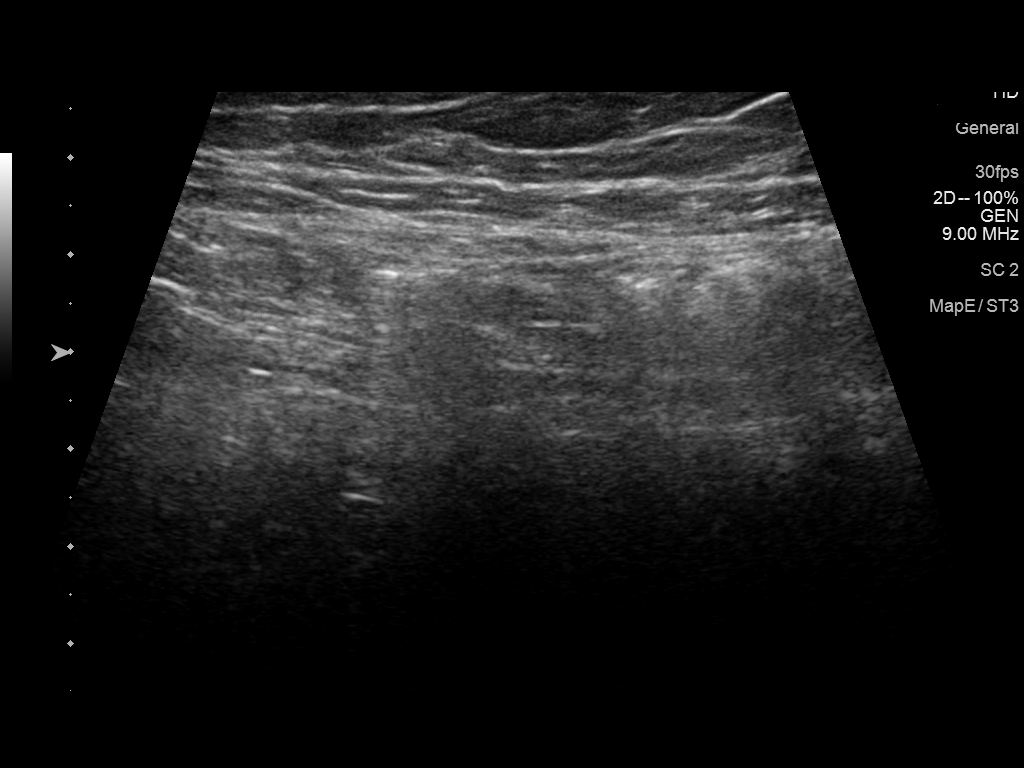
[im 2/5]
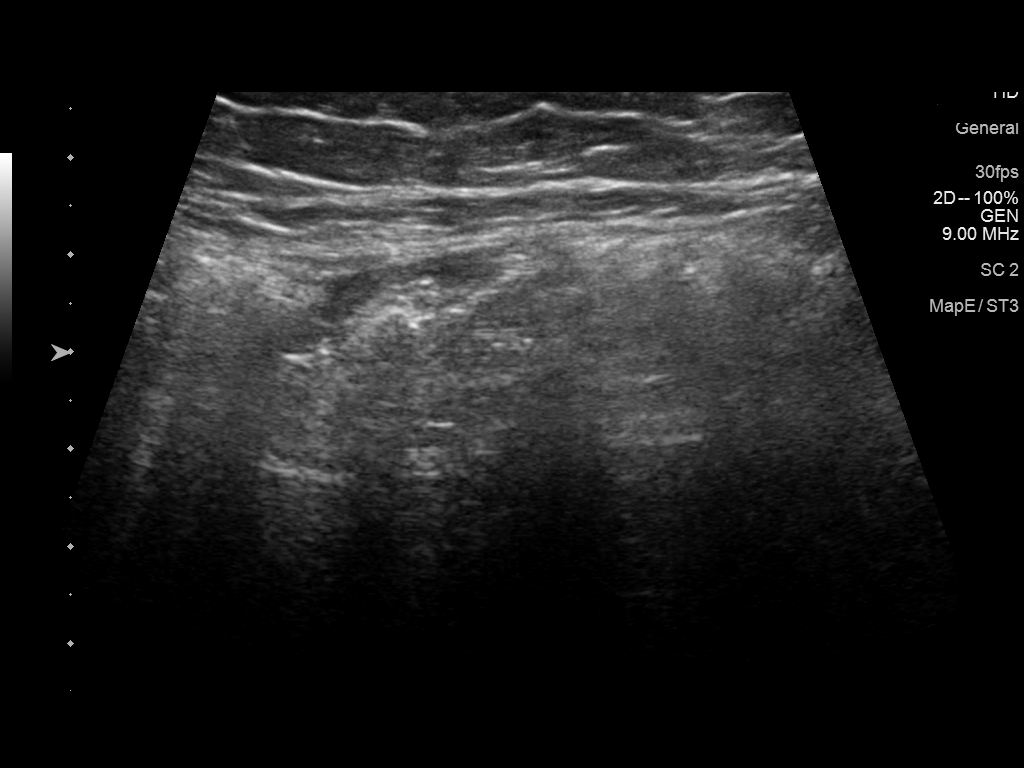
[im 3/5]
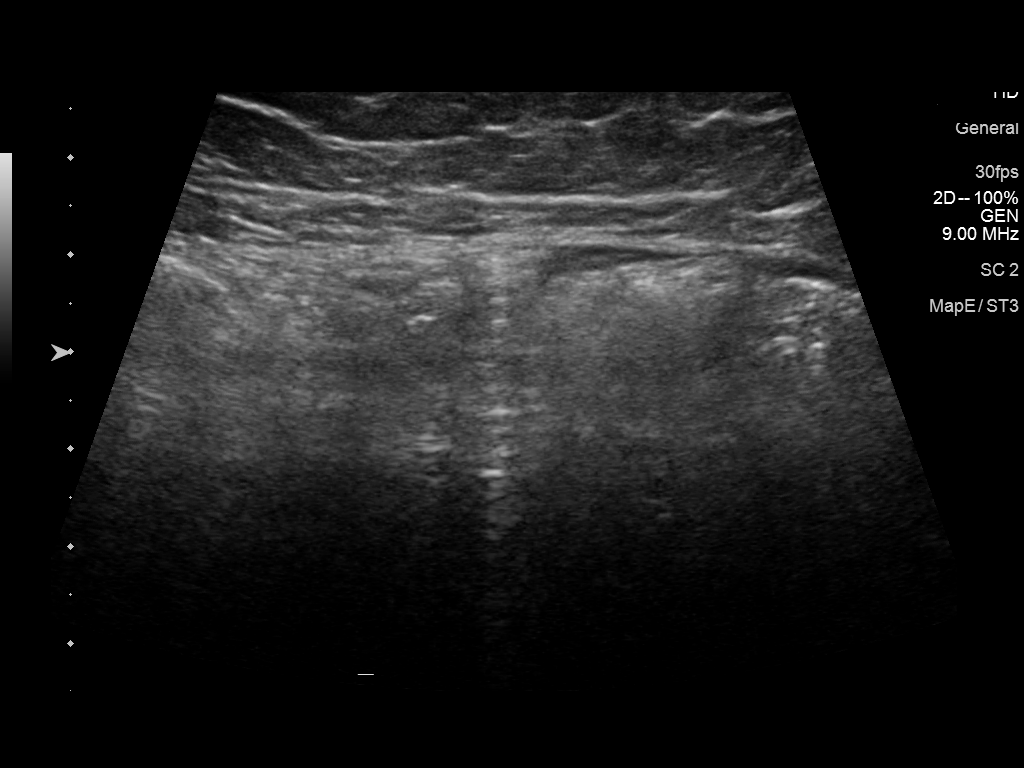
[im 4/5]
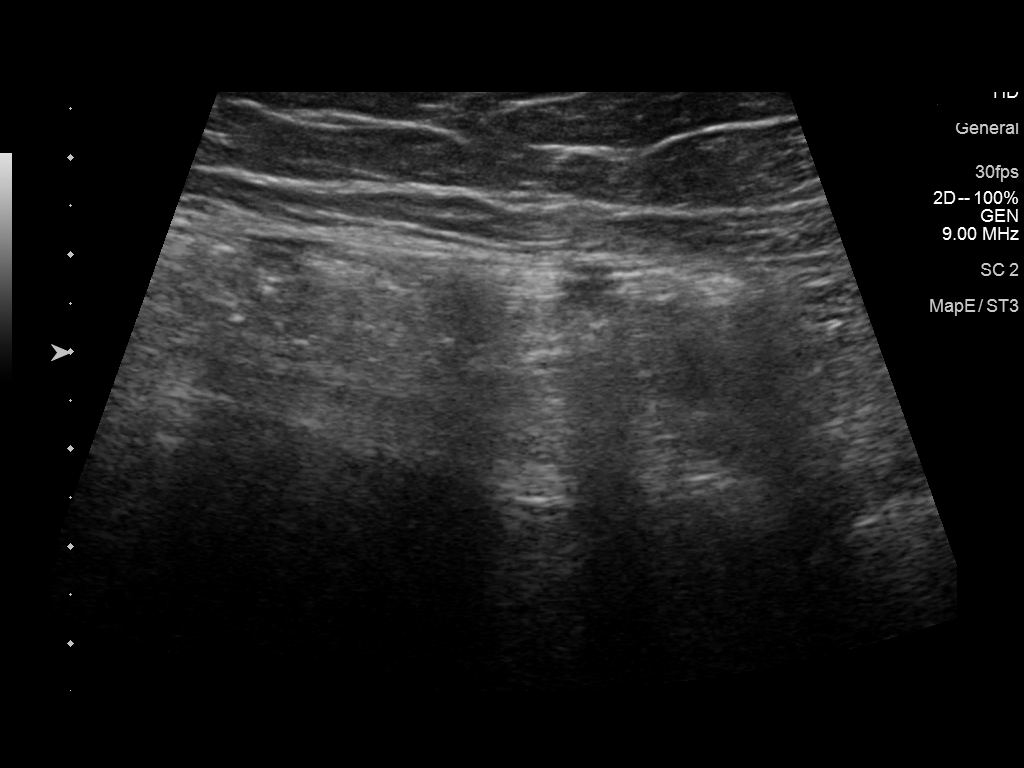
[im 5/5]
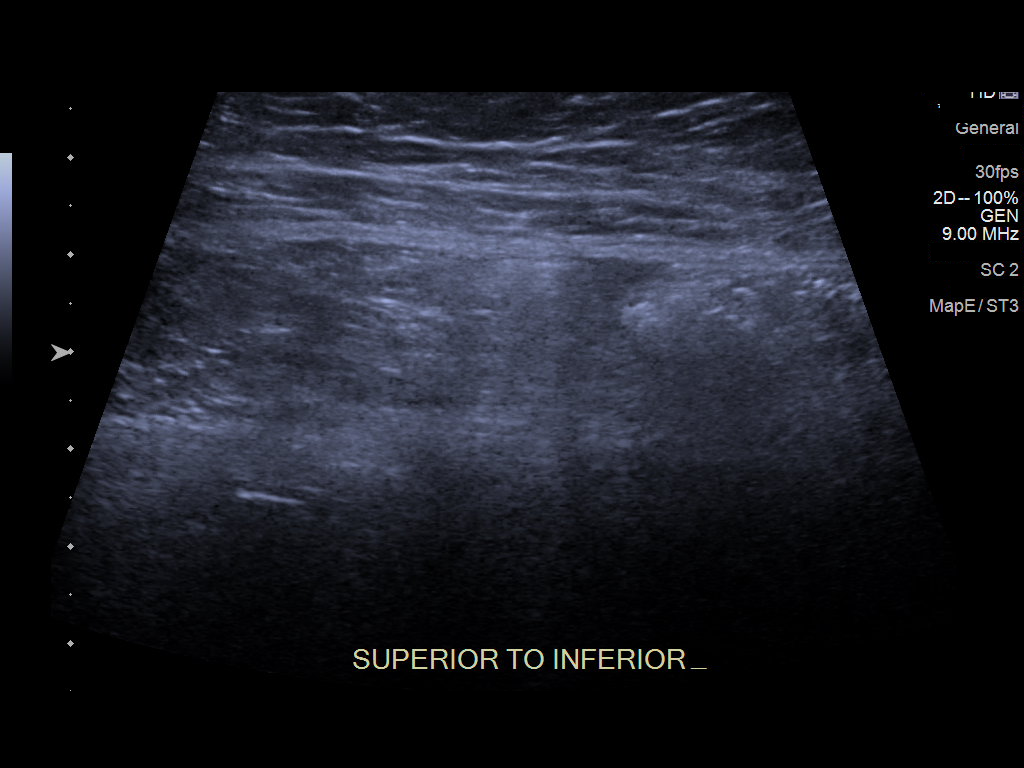

[5 of 5 positions shown; findings below may reference images not displayed]

FINDINGS: Focused ultrasound of the abdominal wall in the right lower quadrant
demonstrates no hernia, mass, or fluid collection.
IMPRESSION: Negative.

## 2021-07-22 ENCOUNTER — Other Ambulatory Visit: Payer: Self-pay

## 2021-07-22 ENCOUNTER — Ambulatory Visit: Payer: Medicare PPO | Admitting: Family Medicine

## 2021-07-22 ENCOUNTER — Encounter: Payer: Self-pay | Admitting: Family Medicine

## 2021-07-22 VITALS — BP 132/70 | HR 63 | Temp 98.0°F | Resp 16 | Ht 65.0 in | Wt 193.0 lb

## 2021-07-22 DIAGNOSIS — Y92009 Unspecified place in unspecified non-institutional (private) residence as the place of occurrence of the external cause: Secondary | ICD-10-CM

## 2021-07-22 DIAGNOSIS — E039 Hypothyroidism, unspecified: Secondary | ICD-10-CM

## 2021-07-22 DIAGNOSIS — R030 Elevated blood-pressure reading, without diagnosis of hypertension: Secondary | ICD-10-CM

## 2021-07-22 DIAGNOSIS — F32A Depression, unspecified: Secondary | ICD-10-CM

## 2021-07-22 DIAGNOSIS — E785 Hyperlipidemia, unspecified: Secondary | ICD-10-CM

## 2021-07-22 DIAGNOSIS — W19XXXA Unspecified fall, initial encounter: Secondary | ICD-10-CM

## 2021-07-22 DIAGNOSIS — I10 Essential (primary) hypertension: Secondary | ICD-10-CM

## 2021-07-22 DIAGNOSIS — Z23 Encounter for immunization: Secondary | ICD-10-CM

## 2021-07-22 LAB — COMPREHENSIVE METABOLIC PANEL
ALT: 17 U/L (ref 0–35)
AST: 22 U/L (ref 0–37)
Albumin: 4.3 g/dL (ref 3.5–5.2)
Alkaline Phosphatase: 71 U/L (ref 39–117)
BUN: 26 mg/dL — ABNORMAL HIGH (ref 6–23)
CO2: 28 mEq/L (ref 19–32)
Calcium: 9.5 mg/dL (ref 8.4–10.5)
Chloride: 98 mEq/L (ref 96–112)
Creatinine, Ser: 0.79 mg/dL (ref 0.40–1.20)
GFR: 72.96 mL/min (ref >60.00)
Glucose, Bld: 82 mg/dL (ref 70–99)
Potassium: 4.1 mEq/L (ref 3.5–5.1)
Sodium: 137 mEq/L (ref 135–145)
Total Bilirubin: 0.7 mg/dL (ref 0.2–1.2)
Total Protein: 7.1 g/dL (ref 6.0–8.3)

## 2021-07-22 LAB — LIPID PANEL
Cholesterol: 227 mg/dL — ABNORMAL HIGH (ref 0–200)
HDL: 71.6 mg/dL (ref >39.00)
LDL Cholesterol: 126 mg/dL — ABNORMAL HIGH (ref 0–99)
NonHDL: 155.25
Total CHOL/HDL Ratio: 3
Triglycerides: 146 mg/dL (ref 0.0–149.0)
VLDL: 29.2 mg/dL (ref 0.0–40.0)

## 2021-07-22 LAB — TSH: TSH: 1.35 u[IU]/mL (ref 0.35–5.50)

## 2021-07-22 MED ORDER — LEVOTHYROXINE SODIUM 150 MCG PO TABS: 150.0000 ug | ORAL_TABLET | Freq: Every day | ORAL | 3 refills | Status: DC

## 2021-07-22 MED ORDER — TRIAMTERENE-HCTZ 37.5-25 MG PO TABS: 1.0000 | ORAL_TABLET | Freq: Every day | ORAL | 1 refills | Status: DC

## 2021-07-22 MED ORDER — ATORVASTATIN CALCIUM 10 MG PO TABS: 10.0000 mg | ORAL_TABLET | ORAL | 1 refills | Status: DC

## 2021-07-22 MED ORDER — DULOXETINE HCL 30 MG PO CPEP: ORAL_CAPSULE | ORAL | 1 refills | Status: DC

## 2021-07-22 NOTE — Progress Notes (Signed)
Subjective:  Patient ID: Elizabeth Lin, female    DOB: Jul 04, 1946  Age: 75 y.o. MRN: [id number]  CC:  Chief Complaint  Patient presents with   Hypertension    Pt reports no physical sxs, no concerns    Hyperthyroidism    Pt due for refill due for recheck   Hyperlipidemia    Pt due for recheck on labs, meds refill    Fall    Pt reports fall off couch onto Rt arm/ side last week notes some soreness has been managing with tylenol but would like assessed for safety    Depression    Doing okay with cymbalta is in need of refill, no questions     HPI Elizabeth Lin presents for   Fall at home: DOI: 12/19.  Kneeling on sofa,Turning towards wall, fell off sofa onto floor- on R outer arm/shoulder, R lateral hip and side. No bruising. Some soreness, took tylenol. Still some soreness, but better.  Able to use extremities normally, no pain with weightbearing, no weakness.  No dyspnea/hemoptysis.  No preceding CP, syncope/near-syncope, or seizure activity.  Tx: tylenol.   Hypertension: Triamterene HCT 37.5/25 mg QD Home readings:none, no new side effects.  BP Readings from Last 3 Encounters:  07/22/21 132/70  01/21/21 126/74  08/02/20 120/70   Lab Results  Component Value Date   CREATININE 0.79 07/22/2021    Hyperlipidemia: Liptor 10mg , intermittent dosing 2 d per week. No new myalgias/side effects on that dosing.  Lab Results  Component Value Date   CHOL 227 (H) 07/22/2021   HDL 71.60 07/22/2021   LDLCALC 126 (H) 07/22/2021   TRIG 146.0 07/22/2021   CHOLHDL 3 07/22/2021   Lab Results  Component Value Date   ALT 17 07/22/2021   AST 22 07/22/2021   ALKPHOS 71 07/22/2021   BILITOT 0.7 07/22/2021   Depression: Cymbalta 30mg  qd. Still working well. No new side effects.  Staying active, new puppy - toy aussie past 5 weeks. Walking more.  Chronic sleep issues - getting to sleep. Handicap daughter at home - she goes to bed around 9pm. Sometimes talking to  self.  Depression screen [facility] 2/9 07/22/2021 07/22/2021 02/11/2021 01/21/2021 06/08/2020  Decreased Interest 0 0 0 0 2  Down, Depressed, Hopeless 0 0 0 0 0  PHQ - 2 Score 0 0 0 0 2  Altered sleeping 3 - - - 2  Tired, decreased energy 2 - - - 2  Change in appetite 0 - - - 3  Feeling bad or failure about yourself  0 - - - 1  Trouble concentrating 0 - - - 1  Moving slowly or fidgety/restless 0 - - - 3  Suicidal thoughts 0 - - - 0  PHQ-9 Score 5 - - - 14  Difficult doing work/chores - - - - -  Some recent data might be hidden    Hypothyroidism: Lab Results  Component Value Date   TSH 1.35 07/22/2021  Taking medication daily. Synthroid 08/08/2020.  No new hot or cold intolerance. No new hair or skin changes, heart palpitations or new fatigue. No new weight changes - intentional weight loss. Surgery in December for hernia. Trying to lose some weight prior. 15# goal.  Wt Readings from Last 3 Encounters:  07/22/21 193 lb (87.5 kg)  02/11/21 207 lb (93.9 kg)  01/21/21 207 lb 3.2 oz (94 kg)     Plans follow up visit to discuss some foot issues.  History Patient Active Problem List  Diagnosis Date Noted   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/08/2019   Meniere disease, left 12/12/2017   Mild aortic stenosis 11/10/2017   Bilateral edema of lower extremity 11/10/2017   Family history of premature CAD 11/10/2017   Arthralgia of multiple joints 10/23/2017   Class 1 obesity due to excess calories without serious comorbidity with body mass index (BMI) of 32.0 to 32.9 in adult 10/23/2017   Elevated blood pressure reading 10/23/2017   Right bundle branch block (RBBB) 10/23/2017   Pure hypercholesterolemia 10/23/2017   Vitamin D deficiency 09/06/2014   Right knee pain 02/24/2013   Hypothyroid 11/02/2011   Depression 11/02/2011   History of positive PPD 11/02/2011   Past Medical History:  Diagnosis Date   Allergy    Anxiety    Arthritis    Carpal tunnel syndrome    right hand, getting injections    Cataract    Depression    Dysrhythmia    BBB   Hammer toe    Hypothyroidism due to Hashimoto's thyroiditis    Meniere disease, right    Past Surgical History:  Procedure Laterality Date   APPENDECTOMY     COLONOSCOPY     CYSTECTOMY     eye   LAPAROSCOPIC ASSISTED SPIGELIAN HERNIA REPAIR N/A 11/08/2019   Procedure: LAPAROSCOPIC ASSISTED SPIGELIAN HERNIA REPAIR WITH MESH;  Surgeon: Manus Rudd, MD;  Location: [facility];  Service: General;  Laterality: N/A;   OOPHORECTOMY Right    TONSILLECTOMY     TRANSTHORACIC ECHOCARDIOGRAM  10/2016   No regional wall motion normality.  GR 1 DD.  Mild aortic stenosis (mean gradient 9 mmHg).  Mild LA dilation.  November 2018   Allergies  Allergen Reactions   Penicillins Rash    Did it involve swelling of the face/tongue/throat, SOB, or low BP? No Did it involve sudden or severe rash/hives, skin peeling, or any reaction on the inside of your mouth or nose? Yes Did you need to seek medical attention at a hospital or doctor's office? No When did it last happen?  ~6-10 years ago     If all above answers are "NO", may proceed with cephalosporin use.    Amoxicillin-Pot Clavulanate     Red rash Did it involve swelling of the face/tongue/throat, SOB, or low BP? No Did it involve sudden or severe rash/hives, skin peeling, or any reaction on the inside of your mouth or nose? Yes Did you need to seek medical attention at a hospital or doctor's office? No When did it last happen? ~6-10 years ago    If all above answers are "NO", may proceed with cephalosporin use.    Sulfa Antibiotics    Prior to Admission medications   Medication Sig Start Date End Date Taking? Authorizing Provider  atorvastatin (LIPITOR) 10 MG tablet Take 1 tablet (10 mg total) by mouth 2 (two) times a week. 01/21/21  Yes Shade Flood, MD  Calcium-Vitamin D-Vitamin K (CALCIUM + D + K PO) Take 1 tablet by mouth 2 (two) times daily. 1300 mg+D1000 IU   Yes [provider]   DULoxetine (CYMBALTA) 30 MG capsule TAKE 1 CAPSULE BY MOUTH EVERY DAY 01/01/21  Yes Shade Flood, MD  fish oil-omega-3 fatty acids 1000 MG capsule Take 1 g by mouth 2 (two) times daily.    Yes [provider]  Gluc-Chonn-MSM-Boswellia-Vit D (GLUCOSAMINE CHOND TRIPLE/VIT D) TABS Take 1 tablet by mouth 2 (two) times daily.   Yes [provider]  levothyroxine (SYNTHROID) 150 MCG tablet Take  1 tablet (150 mcg total) by mouth daily. 10/18/20  Yes Shade Flood, MD  naproxen (NAPROSYN) 500 MG tablet Take 1 tablet (500 mg total) by mouth 2 (two) times daily with a meal. 01/27/20  Yes Janeece Agee, NP  naproxen sodium (ALEVE) 220 MG tablet Take 220-440 mg by mouth 2 (two) times daily as needed (pain/soreness).   Yes [provider]  Polyethyl Glycol-Propyl Glycol (LUBRICANT EYE DROPS) 0.4-0.3 % SOLN Place 1 drop into both eyes 3 (three) times daily as needed (dry/irritated eyes.). Rohto Ice Multi-Symptom Relief   Yes [provider]  triamterene-hydrochlorothiazide (MAXZIDE-25) 37.5-25 MG tablet Take 1 tablet by mouth daily. 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  3 children, 3 grandchildren.   Patient's  Education:  [employer].  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   Patient is right handed   Caffeine consumption 2-3 daily    Walks daily for roughly 30 minutes at a time.  She has not been doing it in the wintertime due to cold weather.      Social Determinants of Health   Financial Resource Strain: Low Risk    Difficulty of Paying Living Expenses: Not hard at all  Food Insecurity: No Food Insecurity   Worried About Programme researcher, broadcasting/film/video in the Last Year: Never true   Ran Out of Food in the Last Year: Never true  Transportation Needs: No Transportation Needs   Lack of Transportation (Medical): No   Lack of Transportation (Non-Medical): No  Physical Activity: Sufficiently Active   Days of Exercise per Week: 5 days   Minutes of Exercise per Session: 60 min  Stress: No Stress Concern Present   Feeling of Stress : Only a little  Social Connections: Moderately Isolated   Frequency of Communication with Friends and Family: More than three times a week   Frequency of Social Gatherings with Friends and Family: More than three times a week   Attends Religious Services: More than 4 times per year   Active Member of [employer] or Organizations: No   Attends Banker Meetings: Never   Marital Status: Divorced  Catering manager Violence: Not At Risk   Fear of Current or Ex-Partner: No   Emotionally Abused: No   Physically Abused: No   Sexually Abused: No    Review of Systems  Constitutional:  Negative for fatigue and unexpected weight change.  Respiratory:  Negative for chest tightness and shortness of breath.   Cardiovascular:  Negative for chest pain, palpitations and leg swelling.  Gastrointestinal:  Negative  for abdominal pain and blood in stool.  Neurological:  Negative for dizziness, syncope, light-headedness and headaches.    Objective:   Vitals:   07/22/21 1017  BP: 132/70  Pulse: 63  Resp: 16   Temp: 98 F (36.7 C)  TempSrc: Temporal  SpO2: 98%  Weight: 193 lb (87.5 kg)  Height:  (1.651 m)     Physical Exam Vitals reviewed.  Constitutional:      Appearance: Normal appearance. She is well-developed.  HENT:     Head: Normocephalic and atraumatic.  Eyes:     Conjunctiva/sclera: Conjunctivae normal.     Pupils: Pupils are equal, round, and reactive to light.  Neck:     Vascular: No carotid bruit.     Comments: No thyromegaly/nodule.  Cardiovascular:     Rate and Rhythm: Normal rate and regular rhythm.     Heart sounds: Normal heart sounds.  Pulmonary:     Effort: Pulmonary effort is normal.     Breath sounds: Normal breath sounds.  Abdominal:     Palpations: Abdomen is soft. There is no pulsatile mass.     Tenderness: There is no abdominal tenderness.  Musculoskeletal:     Right lower leg: No edema.     Left lower leg: No edema.     Comments: Some chronic difficulty with full abduction of shoulder but denies any new pain or difficulty with motion.  Skin intact.  No erythema/ecchymosis, no focal bony tenderness on lateral arm or elbow with full range of motion of elbow.  Right hip pain-free range of motion, no focal tenderness along the right hip or lateral hip/leg.  Skin intact without ecchymosis.  Weight-bear without difficulty.  Skin:    General: Skin is warm and dry.  Neurological:     Mental Status: She is alert and oriented to person, place, and time.  Psychiatric:        Mood and Affect: Mood normal.        Behavior: Behavior normal.       Assessment & Plan:  Elizabeth Lin is a 75 y.o. female . Hypothyroidism, unspecified type - Plan: TSH, levothyroxine (SYNTHROID) 150 MCG tablet  -  Stable, tolerating current regimen. Medications refilled. Labs pending as above.   Hypertension, unspecified type - Plan: Comprehensive metabolic panel Elevated blood pressure reading - Plan: triamterene-hydrochlorothiazide (MAXZIDE-25) 37.5-25 MG  tablet  -Tolerating current regimen, continue same, check labs as above.  Hyperlipidemia, unspecified hyperlipidemia type - Plan: Comprehensive metabolic panel, Lipid panel, atorvastatin (LIPITOR) 10 MG tablet  -Check labs, continue Lipitor with increased frequency of dosing as option depending on readings.  Depression, unspecified depression type - Plan: DULoxetine (CYMBALTA) 30 MG capsule  -Stable on Cymbalta, continue same  Need for influenza vaccination - Plan: Flu Vaccine QUAD High Dose(Fluad)  Fall in home, initial encounter  -Mechanical fall without preceding symptoms, possible slight contusion/soft tissue injury but reassuring exam at present.  Imaging deferred.  RTC precautions and fall precautions discussed.  Meds ordered this encounter  Medications   atorvastatin (LIPITOR) 10 MG tablet    Sig: Take 1 tablet (10 mg total) by mouth 2 (two) times a week.    Dispense:  90 tablet    Refill:  1   DULoxetine (CYMBALTA) 30 MG capsule    Sig: TAKE 1 CAPSULE BY MOUTH EVERY DAY    Dispense:  90 capsule    Refill:  1   triamterene-hydrochlorothiazide (MAXZIDE-25) 37.5-25 MG tablet    Sig: Take 1  tablet by mouth daily.    Dispense:  90 tablet    Refill:  1   levothyroxine (SYNTHROID) 150 MCG tablet    Sig: Take 1 tablet (150 mcg total) by mouth daily.    Dispense:  90 tablet    Refill:  3   Patient Instructions  Try over the counter melatonin before bed. Lowest effective dose  - can start anywhere from to up to  per night.   Tylenol if needed but as no sore areas on exam today, no xray at this time.    Fall Prevention in the Home, Adult Falls can cause injuries and can affect people from all age groups. There are many simple things that you can do to make your home safe and to help prevent falls. Ask for help when making these changes, if needed. What actions can I take to prevent falls? General instructions Use good lighting in all rooms. Replace any light bulbs  that burn out. Turn on lights if it is dark. Use night-lights. Place frequently used items in easy-to-reach places. Lower the shelves around your home if necessary. Set up furniture so that there are clear paths around it. Avoid moving your furniture around. Remove throw rugs and other tripping hazards from the floor. Avoid walking on wet floors. Fix any uneven floor surfaces. Add color or contrast paint or tape to grab bars and handrails in your home. Place contrasting color strips on the first and last steps of stairways. When you use a stepladder, make sure that it is completely opened and that the sides are firmly locked. Have someone hold the ladder while you are using it. Do not climb a closed stepladder. Be aware of any and all pets. What can I do in the bathroom?   Keep the floor dry. Immediately clean up any water that spills onto the floor. Remove soap buildup in the tub or shower on a regular basis. Use non-skid mats or decals on the floor of the tub or shower. Attach bath mats securely with double-sided, non-slip rug tape. If you need to sit down while you are in the shower, use a plastic, non-slip stool. Install grab bars by the toilet and in the tub and shower. Do not use towel bars as grab bars. What can I do in the bedroom? Make sure that a bedside light is easy to reach. Do not use oversized bedding that drapes onto the floor. Have a firm chair that has side arms to use for getting dressed. What can I do in the kitchen? Clean up any spills right away. If you need to reach for something above you, use a sturdy step stool that has a grab bar. Keep electrical cables out of the way. Do not use floor polish or wax that makes floors slippery. If you must use wax, make sure that it is non-skid floor wax. What can I do in the stairways? Do not leave any items on the stairs. Make sure that you have a light switch at the top of the stairs and the bottom of the stairs. Have them  installed if you do not have them. Make sure that there are handrails on both sides of the stairs. Fix handrails that are broken or loose. Make sure that handrails are as long as the stairways. Install non-slip stair treads on all stairs in your home. Avoid having throw rugs at the top or bottom of stairways, or secure the rugs with carpet tape to prevent them  from moving. Choose a carpet design that does not hide the edge of steps on the stairway. Check any carpeting to make sure that it is firmly attached to the stairs. Fix any carpet that is loose or worn. What can I do on the outside of my home? Use bright outdoor lighting. Regularly repair the edges of walkways and driveways and fix any cracks. Remove high doorway thresholds. Trim any shrubbery on the main path into your home. Regularly check that handrails are securely fastened and in good repair. Both sides of any steps should have handrails. Install guardrails along the edges of any raised decks or porches. Clear walkways of debris and clutter, including tools and rocks. Have leaves, snow, and ice cleared regularly. Use sand or salt on walkways during winter months. In the garage, clean up any spills right away, including grease or oil spills. What other actions can I take? Wear closed-toe shoes that fit well and support your feet. Wear shoes that have rubber soles or low heels. Use mobility aids as needed, such as canes, walkers, scooters, and crutches. Review your medicines with your health care provider. Some medicines can cause dizziness or changes in blood pressure, which increase your risk of falling. Talk with your health care provider about other ways that you can decrease your risk of falls. This may include working with a physical therapist or trainer to improve your strength, balance, and endurance. Where to find more information Centers for Disease Control and Prevention, STEADI: TVDivision.uy [employer] on  Aging: RingConnections.si Contact a health care provider if: You are afraid of falling at home. You feel weak, drowsy, or dizzy at home. You fall at home. Summary There are many simple things that you can do to make your home safe and to help prevent falls. Ways to make your home safe include removing tripping hazards and installing grab bars in the bathroom. Ask for help when making these changes in your home. This information is not intended to replace advice given to you by your health care provider. Make sure you discuss any questions you have with your health care provider. Document Revised: 08/28/2017 Document Reviewed: 04/30/2017 Elsevier Patient Education  2021 Elsevier Inc.    Insomnia Insomnia is a sleep disorder that makes it difficult to fall asleep or stay asleep. Insomnia can cause fatigue, low energy, difficulty concentrating, mood swings, and poor performance at work or school. There are three different ways to classify insomnia: Difficulty falling asleep. Difficulty staying asleep. Waking up too early in the morning. Any type of insomnia can be long-term (chronic) or short-term (acute). Both are common. Short-term insomnia usually lasts for three months or less. Chronic insomnia occurs at least three times a week for longer than three months. What are the causes? Insomnia may be caused by another condition, situation, or substance, such as: Anxiety. Certain medicines. Gastroesophageal reflux disease (GERD) or other gastrointestinal conditions. Asthma or other breathing conditions. Restless legs syndrome, sleep apnea, or other sleep disorders. Chronic pain. Menopause. Stroke. Abuse of alcohol, tobacco, or illegal drugs. Mental health conditions, such as depression. Caffeine. Neurological disorders, such as Alzheimer's disease. An overactive thyroid (hyperthyroidism). Sometimes, the cause of insomnia may not be known. What increases the risk? Risk  factors for insomnia include: Gender. Women are affected more often than men. Age. Insomnia is more common as you get older. Stress. Lack of exercise. Irregular work schedule or working night shifts. Traveling between different time zones. Certain medical and mental health conditions. What are  the signs or symptoms? If you have insomnia, the main symptom is having trouble falling asleep or having trouble staying asleep. This may lead to other symptoms, such as: Feeling fatigued or having low energy. Feeling nervous about going to sleep. Not feeling rested in the morning. Having trouble concentrating. Feeling irritable, anxious, or depressed. How is this diagnosed? This condition may be diagnosed based on: Your symptoms and medical history. Your health care provider may ask about: Your sleep habits. Any medical conditions you have. Your mental health. A physical exam. How is this treated? Treatment for insomnia depends on the cause. Treatment may focus on treating an underlying condition that is causing insomnia. Treatment may also include: Medicines to help you sleep. Counseling or therapy. Lifestyle adjustments to help you sleep better. Follow these instructions at home: Eating and drinking  Limit or avoid alcohol, caffeinated beverages, and cigarettes, especially close to bedtime. These can disrupt your sleep. Do not eat a large meal or eat spicy foods right before bedtime. This can lead to digestive discomfort that can make it hard for you to sleep. Sleep habits  Keep a sleep diary to help you and your health care provider figure out what could be causing your insomnia. Write down: When you sleep. When you wake up during the night. How well you sleep. How rested you feel the next day. Any side effects of medicines you are taking. What you eat and drink. Make your bedroom a dark, comfortable place where it is easy to fall asleep. Put up shades or blackout curtains to block  light from outside. Use a white noise machine to block noise. Keep the temperature cool. Limit screen use before bedtime. This includes: Watching TV. Using your smartphone, tablet, or computer. Stick to a routine that includes going to bed and waking up at the same times every day and night. This can help you fall asleep faster. Consider making a quiet activity, such as reading, part of your nighttime routine. Try to avoid taking naps during the day so that you sleep better at night. Get out of bed if you are still awake after 15 minutes of trying to sleep. Keep the lights down, but try reading or doing a quiet activity. When you feel sleepy, go back to bed. General instructions Take over-the-counter and prescription medicines only as told by your health care provider. Exercise regularly, as told by your health care provider. Avoid exercise starting several hours before bedtime. Use relaxation techniques to manage stress. Ask your health care provider to suggest some techniques that may work well for you. These may include: Breathing exercises. Routines to release muscle tension. Visualizing peaceful scenes. Make sure that you drive carefully. Avoid driving if you feel very sleepy. Keep all follow-up visits as told by your health care provider. This is important. Contact a health care provider if: You are tired throughout the day. You have trouble in your daily routine due to sleepiness. You continue to have sleep problems, or your sleep problems get worse. Get help right away if: You have serious thoughts about hurting yourself or someone else. If you ever feel like you may hurt yourself or others, or have thoughts about taking your own life, get help right away. You can go to your nearest emergency department or call: Your local emergency services (911 in the [place] A suicide crisis helpline, such as the National Suicide Prevention Lifeline at 310-692-7110. This is open 24 hours a  day. Summary Insomnia is a sleep disorder  that makes it difficult to fall asleep or stay asleep. Insomnia can be long-term (chronic) or short-term (acute). Treatment for insomnia depends on the cause. Treatment may focus on treating an underlying condition that is causing insomnia. Keep a sleep diary to help you and your health care provider figure out what could be causing your insomnia. This information is not intended to replace advice given to you by your health care provider. Make sure you discuss any questions you have with your health care provider. Document Revised: 07/26/2020 Document Reviewed: 07/26/2020 Elsevier Patient Education  2022 Elsevier Inc.    Signed,   Meredith Staggers, MD [facility], [facility] 07/22/21 6:06 PM

## 2021-07-22 NOTE — Patient Instructions (Addendum)
Try over the counter melatonin before bed. Lowest effective dose  - can start anywhere from to up to 5mg  per night.   Tylenol if needed but as no sore areas on exam today, no xray at this time.    Fall Prevention in the Home, Adult Falls can cause injuries and can affect people from all age groups. There are many simple things that you can do to make your home safe and to help prevent falls. Ask for help when making these changes, if needed. What actions can I take to prevent falls? General instructions Use good lighting in all rooms. Replace any light bulbs that burn out. Turn on lights if it is dark. Use night-lights. Place frequently used items in easy-to-reach places. Lower the shelves around your home if necessary. Set up furniture so that there are clear paths around it. Avoid moving your furniture around. Remove throw rugs and other tripping hazards from the floor. Avoid walking on wet floors. Fix any uneven floor surfaces. Add color or contrast paint or tape to grab bars and handrails in your home. Place contrasting color strips on the first and last steps of stairways. When you use a stepladder, make sure that it is completely opened and that the sides are firmly locked. Have someone hold the ladder while you are using it. Do not climb a closed stepladder. Be aware of any and all pets. What can I do in the bathroom?   Keep the floor dry. Immediately clean up any water that spills onto the floor. Remove soap buildup in the tub or shower on a regular basis. Use non-skid mats or decals on the floor of the tub or shower. Attach bath mats securely with double-sided, non-slip rug tape. If you need to sit down while you are in the shower, use a plastic, non-slip stool. Install grab bars by the toilet and in the tub and shower. Do not use towel bars as grab bars. What can I do in the bedroom? Make sure that a bedside light is easy to reach. Do not use oversized bedding that  drapes onto the floor. Have a firm chair that has side arms to use for getting dressed. What can I do in the kitchen? Clean up any spills right away. If you need to reach for something above you, use a sturdy step stool that has a grab bar. Keep electrical cables out of the way. Do not use floor polish or wax that makes floors slippery. If you must use wax, make sure that it is non-skid floor wax. What can I do in the stairways? Do not leave any items on the stairs. Make sure that you have a light switch at the top of the stairs and the bottom of the stairs. Have them installed if you do not have them. Make sure that there are handrails on both sides of the stairs. Fix handrails that are broken or loose. Make sure that handrails are as long as the stairways. Install non-slip stair treads on all stairs in your home. Avoid having throw rugs at the top or bottom of stairways, or secure the rugs with carpet tape to prevent them from moving. Choose a carpet design that does not hide the edge of steps on the stairway. Check any carpeting to make sure that it is firmly attached to the stairs. Fix any carpet that is loose or worn. What can I do on the outside of my home? Use bright outdoor lighting. Regularly repair the  edges of walkways and driveways and fix any cracks. Remove high doorway thresholds. Trim any shrubbery on the main path into your home. Regularly check that handrails are securely fastened and in good repair. Both sides of any steps should have handrails. Install guardrails along the edges of any raised decks or porches. Clear walkways of debris and clutter, including tools and rocks. Have leaves, snow, and ice cleared regularly. Use sand or salt on walkways during winter months. In the garage, clean up any spills right away, including grease or oil spills. What other actions can I take? Wear closed-toe shoes that fit well and support your feet. Wear shoes that have rubber soles or  low heels. Use mobility aids as needed, such as canes, walkers, scooters, and crutches. Review your medicines with your health care provider. Some medicines can cause dizziness or changes in blood pressure, which increase your risk of falling. Talk with your health care provider about other ways that you can decrease your risk of falls. This may include working with a physical therapist or trainer to improve your strength, balance, and endurance. Where to find more information Centers for Disease Control and Prevention, STEAD[employer]: TVDivision.uy [employer] on Aging: RingConnections.si Contact a health care provider if: You are afraid of falling at home. You feel weak, drowsy, or dizzy at home. You fall at home. Summary There are many simple things that you can do to make your home safe and to help prevent falls. Ways to make your home safe include removing tripping hazards and installing grab bars in the bathroom. Ask for help when making these changes in your home. This information is not intended to replace advice given to you by your health care provider. Make sure you discuss any questions you have with your health care provider. Document Revised: 08/28/2017 Document Reviewed: 04/30/2017 Elsevier Patient Education  2021 Elsevier Inc.    Insomnia Insomnia is a sleep disorder that makes it difficult to fall asleep or stay asleep. Insomnia can cause fatigue, low energy, difficulty concentrating, mood swings, and poor performance at work or school. There are three different ways to classify insomnia: Difficulty falling asleep. Difficulty staying asleep. Waking up too early in the morning. Any type of insomnia can be long-term (chronic) or short-term (acute). Both are common. Short-term insomnia usually lasts for three months or less. Chronic insomnia occurs at least three times a week for longer than three months. What are the causes? Insomnia may be caused by another  condition, situation, or substance, such as: Anxiety. Certain medicines. Gastroesophageal reflux disease (GERD) or other gastrointestinal conditions. Asthma or other breathing conditions. Restless legs syndrome, sleep apnea, or other sleep disorders. Chronic pain. Menopause. Stroke. Abuse of alcohol, tobacco, or illegal drugs. Mental health conditions, such as depression. Caffeine. Neurological disorders, such as Alzheimer's disease. An overactive thyroid (hyperthyroidism). Sometimes, the cause of insomnia may not be known. What increases the risk? Risk factors for insomnia include: Gender. Women are affected more often than men. Age. Insomnia is more common as you get older. Stress. Lack of exercise. Irregular work schedule or working night shifts. Traveling between different time zones. Certain medical and mental health conditions. What are the signs or symptoms? If you have insomnia, the main symptom is having trouble falling asleep or having trouble staying asleep. This may lead to other symptoms, such as: Feeling fatigued or having low energy. Feeling nervous about going to sleep. Not feeling rested in the morning. Having trouble concentrating. Feeling irritable, anxious, or depressed. How  is this diagnosed? This condition may be diagnosed based on: Your symptoms and medical history. Your health care provider may ask about: Your sleep habits. Any medical conditions you have. Your mental health. A physical exam. How is this treated? Treatment for insomnia depends on the cause. Treatment may focus on treating an underlying condition that is causing insomnia. Treatment may also include: Medicines to help you sleep. Counseling or therapy. Lifestyle adjustments to help you sleep better. Follow these instructions at home: Eating and drinking  Limit or avoid alcohol, caffeinated beverages, and cigarettes, especially close to bedtime. These can disrupt your sleep. Do not  eat a large meal or eat spicy foods right before bedtime. This can lead to digestive discomfort that can make it hard for you to sleep. Sleep habits  Keep a sleep diary to help you and your health care provider figure out what could be causing your insomnia. Write down: When you sleep. When you wake up during the night. How well you sleep. How rested you feel the next day. Any side effects of medicines you are taking. What you eat and drink. Make your bedroom a dark, comfortable place where it is easy to fall asleep. Put up shades or blackout curtains to block light from outside. Use a white noise machine to block noise. Keep the temperature cool. Limit screen use before bedtime. This includes: Watching TV. Using your smartphone, tablet, or computer. Stick to a routine that includes going to bed and waking up at the same times every day and night. This can help you fall asleep faster. Consider making a quiet activity, such as reading, part of your nighttime routine. Try to avoid taking naps during the day so that you sleep better at night. Get out of bed if you are still awake after 15 minutes of trying to sleep. Keep the lights down, but try reading or doing a quiet activity. When you feel sleepy, go back to bed. General instructions Take over-the-counter and prescription medicines only as told by your health care provider. Exercise regularly, as told by your health care provider. Avoid exercise starting several hours before bedtime. Use relaxation techniques to manage stress. Ask your health care provider to suggest some techniques that may work well for you. These may include: Breathing exercises. Routines to release muscle tension. Visualizing peaceful scenes. Make sure that you drive carefully. Avoid driving if you feel very sleepy. Keep all follow-up visits as told by your health care provider. This is important. Contact a health care provider if: You are tired throughout the  day. You have trouble in your daily routine due to sleepiness. You continue to have sleep problems, or your sleep problems get worse. Get help right away if: You have serious thoughts about hurting yourself or someone else. If you ever feel like you may hurt yourself or others, or have thoughts about taking your own life, get help right away. You can go to your nearest emergency department or call: Your local emergency services (911 in the [place] A suicide crisis helpline, such as the National Suicide Prevention Lifeline at 434-850-6802. This is open 24 hours a day. Summary Insomnia is a sleep disorder that makes it difficult to fall asleep or stay asleep. Insomnia can be long-term (chronic) or short-term (acute). Treatment for insomnia depends on the cause. Treatment may focus on treating an underlying condition that is causing insomnia. Keep a sleep diary to help you and your health care provider figure out what could be causing your  insomnia. This information is not intended to replace advice given to you by your health care provider. Make sure you discuss any questions you have with your health care provider. Document Revised: 07/26/2020 Document Reviewed: 07/26/2020 Elsevier Patient Education  2022 [employer].

## 2021-07-23 ENCOUNTER — Encounter: Payer: Self-pay | Admitting: Family Medicine

## 2021-08-07 ENCOUNTER — Ambulatory Visit: Payer: Medicare PPO | Admitting: Family Medicine

## 2021-08-14 ENCOUNTER — Encounter: Payer: Self-pay | Admitting: Registered Nurse

## 2021-08-14 ENCOUNTER — Other Ambulatory Visit: Payer: Self-pay

## 2021-08-14 ENCOUNTER — Ambulatory Visit: Payer: Medicare PPO | Admitting: Registered Nurse

## 2021-08-14 VITALS — BP 111/62 | HR 61 | Temp 98.1°F | Resp 18 | Ht 65.0 in | Wt 195.8 lb

## 2021-08-14 DIAGNOSIS — M545 Low back pain, unspecified: Secondary | ICD-10-CM

## 2021-08-14 DIAGNOSIS — R109 Unspecified abdominal pain: Secondary | ICD-10-CM | POA: Diagnosis not present

## 2021-08-14 LAB — POCT URINALYSIS DIP (MANUAL ENTRY)
Bilirubin, UA: NEGATIVE
Glucose, UA: NEGATIVE mg/dL
Ketones, POC UA: NEGATIVE mg/dL
Leukocytes, UA: NEGATIVE
Nitrite, UA: NEGATIVE
Spec Grav, UA: >=1.03 — AB (ref 1.010–1.025)
Urobilinogen, UA: 0.2 E.U./dL
pH, UA: 5 (ref 5.0–8.0)

## 2021-08-14 NOTE — Progress Notes (Signed)
Established Patient Office Visit  Subjective:  Patient ID: Elizabeth Lin, female    DOB: 05/09/1946  Age: 75 y.o. MRN: [id number]  CC:  Chief Complaint  Patient presents with   Flank Pain    Patient states she has been having some right side pain for for days and also feeling tired. Patient states she has also been som frequent urination.    HPI [name] presents for flank pain  R side, ongoing for a few days Some frequent urination.  No gross hematuria.  Denies dysuria, vaginal symptoms, fever, chills, fatigue, and cognitive changes.  Feels more like muscular pain Has used heating pad with some relief. Has not taken any analgesics.  Otherwise no acute concerns.   Past Medical History:  Diagnosis Date   Allergy    Anxiety    Arthritis    Carpal tunnel syndrome    right hand, getting injections   Cataract    Depression    Dysrhythmia    BBB   Hammer toe    Hypothyroidism due to Hashimoto's thyroiditis    Meniere disease, right     Past Surgical History:  Procedure Laterality Date   APPENDECTOMY     COLONOSCOPY     CYSTECTOMY     eye   LAPAROSCOPIC ASSISTED SPIGELIAN HERNIA REPAIR N/A 11/08/2019   Procedure: LAPAROSCOPIC ASSISTED SPIGELIAN HERNIA REPAIR WITH MESH;  Surgeon: Donnie Mesa, MD;  Location: [facility];  Service: General;  Laterality: N/A;   OOPHORECTOMY Right    TONSILLECTOMY     TRANSTHORACIC ECHOCARDIOGRAM  10/2016   No regional wall motion normality.  GR 1 DD.  Mild aortic stenosis (mean gradient 9 mmHg).  Mild LA dilation.  November 2018    Family History  Problem Relation Age of Onset   Diabetes Father    Myasthenia gravis Father        Died from complications in his [id number]   CAD Father        Was too sick with myasthenia gravis to have CABG   Hypertension Father    Thyroid disease Mother    Cancer Mother        Lung -died at age 78-1/2.   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  3 children, 3 grandchildren.   Patient's  Education: [employer].  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   Patient is right handed   Caffeine consumption 2-3 daily    Walks daily for roughly 30 minutes at a time.  She has not been doing it in the wintertime due to cold weather.      Social Determinants of Health   Financial Resource Strain: Low Risk    Difficulty of Paying Living Expenses: Not hard at all  Food Insecurity: No Food Insecurity   Worried About Programme researcher, broadcasting/film/video in the Last Year: Never true   Ran Out of Food in the Last Year: Never true  Transportation Needs: No Transportation Needs   Lack of Transportation (Medical): No   Lack of Transportation (Non-Medical): No  Physical Activity: Sufficiently Active   Days of Exercise per Week: 5 days   Minutes of Exercise per Session: 60 min  Stress: No Stress Concern Present   Feeling of Stress  : Only a little  Social Connections: Moderately Isolated   Frequency of Communication with Friends and Family: More than three times a week   Frequency of Social Gatherings with Friends and Family: More than three times a week   Attends Religious Services: More than 4 times per year   Active Member of [employer] or Organizations: No   Attends Banker Meetings: Never   Marital Status: Divorced  Catering manager Violence: Not At Risk   Fear of Current or Ex-Partner: No   Emotionally Abused: No   Physically Abused: No   Sexually Abused: No    Outpatient Medications Prior to Visit  Medication Sig Dispense Refill   atorvastatin (LIPITOR) 10 MG tablet Take 1 tablet (10 mg total) by mouth 2 (two) times a week. 90 tablet 1   Calcium-Vitamin D-Vitamin K (CALCIUM + D + K PO) Take 1 tablet by mouth 2 (two) times daily. 1300 mg+D1000 IU     DULoxetine (CYMBALTA) 30 MG capsule TAKE 1 CAPSULE BY MOUTH EVERY DAY 90 capsule 1   fish oil-omega-3 fatty acids 1000 MG capsule Take 1 g by mouth 2 (two) times daily.      Gluc-Chonn-MSM-Boswellia-Vit D (GLUCOSAMINE CHOND TRIPLE/VIT D) TABS Take 1 tablet by mouth 2 (two) times daily.     levothyroxine (SYNTHROID) 150 MCG tablet Take 1 tablet (150 mcg total) by mouth daily. 90 tablet 3   naproxen (NAPROSYN) 500 MG tablet Take 1 tablet (500 mg total) by mouth 2 (two) times daily with a meal. 30 tablet 0   naproxen sodium (ALEVE) 220 MG tablet Take 220-440 mg by mouth 2 (two) times daily as needed (pain/soreness).     Polyethyl Glycol-Propyl Glycol (LUBRICANT EYE DROPS) 0.4-0.3 % SOLN Place 1 drop into both eyes 3 (three) times daily as needed (dry/irritated eyes.). Rohto Ice Multi-Symptom Relief     triamterene-hydrochlorothiazide (MAXZIDE-25) 37.5-25 MG tablet Take 1 tablet by mouth daily. 90 tablet 1   No facility-administered medications prior to visit.    Allergies  Allergen Reactions   Penicillins Rash    Did it involve swelling of the  face/tongue/throat, SOB, or low BP? No Did it involve sudden or severe rash/hives, skin peeling, or any reaction on the inside of your mouth or nose? Yes Did you need to seek medical attention at a hospital or doctor's office? No When did it last happen?  ~6-10 years ago  If all above answers are "NO", may proceed with cephalosporin use.    Amoxicillin-Pot Clavulanate     Red rash Did it involve swelling of the face/tongue/throat, SOB, or low BP? No Did it involve sudden or severe rash/hives, skin peeling, or any reaction on the inside of your mouth or nose? Yes Did you need to seek medical attention at a hospital or doctor's office? No When did it last happen? ~6-10 years ago    If all above answers are "NO", may proceed with cephalosporin use.    Sulfa Antibiotics     ROS Review of Systems  Constitutional: Negative.   HENT: Negative.    Eyes: Negative.   Respiratory: Negative.    Cardiovascular: Negative.   Gastrointestinal: Negative.   Genitourinary: Negative.   Musculoskeletal: Negative.   Skin: Negative.   Neurological: Negative.   Psychiatric/Behavioral: Negative.    All other systems reviewed and are negative.    Objective:    Physical Exam Vitals and nursing note reviewed.  Constitutional:      General: She is not in acute distress.    Appearance: Normal appearance. She is normal weight. She is not ill-appearing, toxic-appearing or diaphoretic.  Cardiovascular:     Rate and Rhythm: Normal rate and regular rhythm.     Heart sounds: Normal heart sounds. No murmur heard.   No friction rub. No gallop.  Pulmonary:     Effort: Pulmonary effort is normal. No respiratory distress.     Breath sounds: Normal breath sounds. No stridor. No wheezing, rhonchi or rales.  Chest:     Chest wall: No tenderness.  Skin:    General: Skin is warm and dry.  Neurological:     General: No focal deficit present.     Mental Status: She is alert and oriented to person, place, and  time. Mental status is at baseline.  Psychiatric:        Mood and Affect: Mood normal.        Behavior: Behavior normal.        Thought Content: Thought content normal.        Judgment: Judgment normal.    BP 111/62   Pulse 61   Temp 98.1 F (36.7 C) (Temporal)   Resp 18   Ht 5\' 5"  (1.651 m)   Wt 195 lb 12.8 oz (88.8 kg)   SpO2 97%   BMI 32.58 kg/m  Wt Readings from Last 3 Encounters:  08/14/21 195 lb 12.8 oz (88.8 kg)  07/22/21 193 lb (87.5 kg)  02/11/21 207 lb (93.9 kg)     Health Maintenance Due  Topic Date Due   COVID-19 Vaccine (4 - Booster for Pfizer series) 07/29/2020    There are no preventive care reminders to display for this patient.  Lab Results  Component Value Date   TSH 1.35 07/22/2021   Lab Results  Component Value Date   WBC 6.3 06/08/2020   HGB 13.6 06/08/2020   HCT 44.9 06/08/2020   MCV 91 06/08/2020   PLT 340 06/08/2020   Lab Results  Component Value Date   NA 137 07/22/2021   K 4.1 07/22/2021   CO2 28 07/22/2021   GLUCOSE 82 07/22/2021   BUN 26 (H) 07/22/2021   CREATININE 0.79 07/22/2021   BILITOT 0.7 07/22/2021   ALKPHOS 71 07/22/2021   AST 22 07/22/2021   ALT 17 07/22/2021   PROT 7.1 07/22/2021   ALBUMIN 4.3 07/22/2021   CALCIUM 9.5 07/22/2021   GFR 72.96 07/22/2021  Lab Results  Component Value Date   CHOL 227 (H) 07/22/2021   Lab Results  Component Value Date   HDL 71.60 07/22/2021   Lab Results  Component Value Date   LDLCALC 126 (H) 07/22/2021   Lab Results  Component Value Date   TRIG 146.0 07/22/2021   Lab Results  Component Value Date   CHOLHDL 3 07/22/2021   Lab Results  Component Value Date   HGBA1C 5.6 10/21/2017      Assessment & Plan:   Problem List Items Addressed This Visit   None Visit Diagnoses     Flank pain    -  Primary   Relevant Orders   POCT urinalysis dipstick (Completed)   Urine Culture   Acute right-sided low back pain without sciatica           No orders of the  defined types were placed in this encounter.   Follow-up: Return if symptoms worsen or fail to improve.   PLAN Poct ua reassuring re UTI. Will culture.  Suspect msk etiology recommend she continue conservative management. Can consider low dose of mild muscle relaxer if persistent. Recommend RICE, heat, stretching Return precautions reviewed Patient encouraged to call clinic with any questions, comments, or concerns.  Maximiano Coss, NP

## 2021-08-14 NOTE — Patient Instructions (Addendum)
Ms. Shadi Larner to see you. Glad back pain is improving  Continue with heat. Recommend alternating this and ice every 10-15 minutes. Stretch regularly - recommend 10-15 minutes of stretching 1-2 times daily.  If urinary symptoms arise, pain worsens, fatigue worsens, or symptoms change, call me  We are sending out a urine culture. I will call with results when they are available.  Hydrate, hydrate, hydrate! About 1/2 of your body weight in oz of water each day.   Thank you  Rich     If you have lab work done today you will be contacted with your lab results within the next 2 weeks.  If you have not heard from [place] then please contact us. The fastest way to get your results is to register for My Chart.   IF you received an x-ray today, you will receive an invoice from [facility]. Please contact [facility] at [phone] with questions or concerns regarding your invoice.   IF you received labwork today, you will receive an invoice from [facility]. Please contact [facility] at [phone] with questions or concerns regarding your invoice.   Our billing staff will not be able to assist you with questions regarding bills from these companies.  You will be contacted with the lab results as soon as they are available. The fastest way to get your results is to activate your My Chart account. Instructions are located on the last page of this paperwork. If you have not heard from [place] regarding the results in 2 weeks, please contact this office.

## 2021-08-15 LAB — URINE CULTURE
MICRO NUMBER:: 12646167
SPECIMEN QUALITY:: ADEQUATE

## 2021-08-19 ENCOUNTER — Ambulatory Visit: Payer: Medicare PPO | Admitting: Family Medicine

## 2021-08-27 ENCOUNTER — Telehealth: Payer: Self-pay | Admitting: Family Medicine

## 2021-08-27 NOTE — Telephone Encounter (Signed)
Pt has been called and informed we are unable to order to other facilities besides [facility] and [facility] as well as possibility to be billed for non covered testing due to not being symptomatic

## 2021-08-27 NOTE — Telephone Encounter (Signed)
..  Caller name:  Elizabeth Lin  On DPR? :yes/no: Yes  Call back number:[phone]  Provider they see: Neva Seat  Reason for call:   Patient needs an order for a covid test - the test that has to be sent off.  She is having surgery and has found somewhere that will do the covid test for her that the surgeons are requiring.  Please advise.

## 2021-08-28 DIAGNOSIS — K439 Ventral hernia without obstruction or gangrene: Secondary | ICD-10-CM | POA: Diagnosis not present

## 2021-08-28 DIAGNOSIS — R600 Localized edema: Secondary | ICD-10-CM | POA: Diagnosis not present

## 2021-08-28 DIAGNOSIS — Z01818 Encounter for other preprocedural examination: Secondary | ICD-10-CM | POA: Diagnosis not present

## 2021-08-28 DIAGNOSIS — E039 Hypothyroidism, unspecified: Secondary | ICD-10-CM | POA: Diagnosis not present

## 2021-08-28 DIAGNOSIS — I4510 Unspecified right bundle-branch block: Secondary | ICD-10-CM | POA: Diagnosis not present

## 2021-08-28 DIAGNOSIS — F32A Depression, unspecified: Secondary | ICD-10-CM | POA: Diagnosis not present

## 2021-08-28 DIAGNOSIS — I350 Nonrheumatic aortic (valve) stenosis: Secondary | ICD-10-CM | POA: Diagnosis not present

## 2021-08-28 DIAGNOSIS — E7800 Pure hypercholesterolemia, unspecified: Secondary | ICD-10-CM | POA: Diagnosis not present

## 2021-08-28 DIAGNOSIS — H903 Sensorineural hearing loss, bilateral: Secondary | ICD-10-CM | POA: Diagnosis not present

## 2021-08-30 DIAGNOSIS — Z20822 Contact with and (suspected) exposure to covid-19: Secondary | ICD-10-CM | POA: Diagnosis not present

## 2021-08-30 DIAGNOSIS — Z01818 Encounter for other preprocedural examination: Secondary | ICD-10-CM | POA: Diagnosis not present

## 2021-09-02 DIAGNOSIS — K439 Ventral hernia without obstruction or gangrene: Secondary | ICD-10-CM | POA: Diagnosis not present

## 2021-09-02 DIAGNOSIS — I10 Essential (primary) hypertension: Secondary | ICD-10-CM | POA: Diagnosis not present

## 2021-09-02 DIAGNOSIS — Z79899 Other long term (current) drug therapy: Secondary | ICD-10-CM | POA: Diagnosis not present

## 2021-09-02 DIAGNOSIS — K432 Incisional hernia without obstruction or gangrene: Secondary | ICD-10-CM | POA: Diagnosis not present

## 2021-09-02 DIAGNOSIS — E039 Hypothyroidism, unspecified: Secondary | ICD-10-CM | POA: Diagnosis not present

## 2021-09-24 DIAGNOSIS — Z09 Encounter for follow-up examination after completed treatment for conditions other than malignant neoplasm: Secondary | ICD-10-CM | POA: Diagnosis not present

## 2021-10-16 DIAGNOSIS — D225 Melanocytic nevi of trunk: Secondary | ICD-10-CM | POA: Diagnosis not present

## 2021-10-16 DIAGNOSIS — L821 Other seborrheic keratosis: Secondary | ICD-10-CM | POA: Diagnosis not present

## 2022-01-20 ENCOUNTER — Encounter: Payer: Self-pay | Admitting: Cardiology

## 2022-01-20 ENCOUNTER — Ambulatory Visit: Payer: Medicare PPO | Admitting: Cardiology

## 2022-01-20 VITALS — BP 128/72 | HR 63 | Ht 65.0 in | Wt 197.2 lb

## 2022-01-20 DIAGNOSIS — R9431 Abnormal electrocardiogram [ECG] [EKG]: Secondary | ICD-10-CM

## 2022-01-20 DIAGNOSIS — R600 Localized edema: Secondary | ICD-10-CM

## 2022-01-20 DIAGNOSIS — I10 Essential (primary) hypertension: Secondary | ICD-10-CM | POA: Diagnosis not present

## 2022-01-20 DIAGNOSIS — Z6832 Body mass index (BMI) 32.0-32.9, adult: Secondary | ICD-10-CM

## 2022-01-20 DIAGNOSIS — E6609 Other obesity due to excess calories: Secondary | ICD-10-CM | POA: Diagnosis not present

## 2022-01-20 DIAGNOSIS — E7800 Pure hypercholesterolemia, unspecified: Secondary | ICD-10-CM

## 2022-01-20 DIAGNOSIS — I350 Nonrheumatic aortic (valve) stenosis: Secondary | ICD-10-CM

## 2022-01-20 DIAGNOSIS — I4510 Unspecified right bundle-branch block: Secondary | ICD-10-CM

## 2022-01-20 DIAGNOSIS — Z8249 Family history of ischemic heart disease and other diseases of the circulatory system: Secondary | ICD-10-CM

## 2022-01-20 NOTE — Progress Notes (Signed)
? ? ?Primary Care Provider: Wendie Agreste, MD ?Cardiologist: Glenetta Hew, MD ?Electrophysiologist: None ? ?Clinic Note: ?Chief Complaint  ?Patient presents with  ? Follow-up  ?  2-year follow-up-no real cardiac symptoms.  ? Aortic Stenosis  ?  Read as mild back in 2019.  Follow-up echo in 2021 still showed mild AS gradient 8 mmHg.  ? ?=================================== ? ?ASSESSMENT/PLAN  ? ?Problem List Items Addressed This Visit   ? ?  ? Cardiology Problems  ? Right bundle branch block (RBBB) (Chronic)  ?  Benign findings.  Normal echo. ? ?  ?  ? Relevant Orders  ? CT CARDIAC SCORING (SELF PAY ONLY) (Completed)  ? ECHOCARDIOGRAM COMPLETE  ? Mild aortic stenosis - Primary (Chronic)  ?  Murmur on exam.  Mild AS by echo.  Has not changed.  Can recheck in April 2024 which would be a 3-year follow-up.  This can be prior to her annual follow-up with me ? ?  ?  ? Relevant Orders  ? ECHOCARDIOGRAM COMPLETE  ? Essential hypertension (Chronic)  ?  Blood pressure looks pretty good today.  She is on Maxide.  Tolerating it well. ? ?  ?  ? Pure hypercholesterolemia (Chronic)  ?  Last lipids were from October 2022: TC was 227, TG 146, HDL 71 and LDL 126. ?As a result of his labs, her PCP started her on atorvastatin 10 mg daily. ? ?She will be due for recheck of labs soon. ? ?Recheck Coronary Calcium Score to again risk stratify to determine target ? ?  ?  ?  ? Other  ? Class 1 obesity due to excess calories without serious comorbidity with body mass index (BMI) of 32.0 to 32.9 in adult (Chronic)  ? Bilateral edema of lower extremity (Chronic)  ?  In the absence of PND or orthopnea with normal echocardiogram, unlikely to be anything with a venous stasis. ?She was started on HCTZ and this seems to have helped her edema. ? ?  ?  ? Family history of premature CAD (Chronic)  ?  Evaluated back in 2019 with coronary Score that was 0.  She does have family history, and also has hyperlipidemia and hypertension.  Both are pretty  well controlled.  She is non-smoker. ? ?She would very much like to reevaluate her risk, especially since he was just started on a statin. ? ?Plan: Recheck Coronary Calcium Score in the next month or so. => Recommended treatment target range for LDL. ? ?  ?  ? Relevant Orders  ? CT CARDIAC SCORING (SELF PAY ONLY) (Completed)  ? ?Other Visit Diagnoses   ? ? Abnormal electrocardiogram      ? Relevant Orders  ? CT CARDIAC SCORING (SELF PAY ONLY) (Completed)  ? ?  ? ? ?=================================== ? ?HPI:   ? ?Elizabeth Lin is a 76 y.o. female with a PMH notable for RBBB, Mild Aortic Stenosis, HLD, Coronary Calcium Score 0, carpal tunnel disease), and hypothyroidism plus Depression who presents today for 2-year follow-up at the request of Wendie Agreste, MD. ? ?Elizabeth Lin was last seen on January 06, 2020 via telemedicine for follow-up Coronary Calcium Score.  Coronary Calcium Score 0.  Echo showed mild (very mild) aortic stenosis.  No symptoms.  Plan was for 3-year follow-up echo.  Recommended targeting LDL less than 100. ? ?Recent Hospitalizations:  ?Spigelian Herniorrhaphy 09/02/2021: [facility]) ? ?Reviewed  CV studies:   ? ?The following studies were reviewed today: (if available, images/films reviewed: From  Epic Chart or Care Everywhere) ?No recent studies: ? ? ?Interval History:  ? ?Elizabeth Lin returns here today for delayed follow-up.  Says her PCP recently checked labs.  Really doing okay overall from cardiac standpoint.  She has lots of different issues.  Most notably she just recovered from a ventral hernia surgery.  She has some left-sided head headache and dizziness that comes and goes.  She had bilateral carpal tunnel syndrome symptoms on her right greater than left. ?She has off-and-on dizziness, but no passing out.  She overall feels good but states that that something is" not right, it is just there ".  ?Edema pretty well controlled when she uses her support and elevates her feet.   Walking is limited by pain in her right foot.  She says that the right leg is usually more swollen than the left. ? ?Most weeks he tries to do 35 to 45-minute walk and really only notes discussion of dyspnea if she is going up hills at a quick pace.  She also does 2 days at the gym. ? ?CV Review of Symptoms (Summary) ?Cardiovascular ROS: no chest pain or dyspnea on exertion ?positive for - edema and dizziness and lightheadedness.  Unsteady gait.  Swelling better with support hose and elevation. ?negative for - irregular heartbeat, orthopnea, palpitations, paroxysmal nocturnal dyspnea, rapid heart rate, shortness of breath, or syncope or near syncope, TIA/amaurosis fugax symptoms, or or claudication ? ?REVIEWED OF SYSTEMS  ? ?Review of Systems  ?Constitutional:  Positive for malaise/fatigue (Does not a lot of energy.  She tries to exercise but not always.  Limited by foot pain.).  ?HENT:  Negative for congestion and nosebleeds.   ?Respiratory:  Negative for cough and shortness of breath (Only with overexertion).   ?Cardiovascular:  Negative for leg swelling.  ?     Per HPI  ?Gastrointestinal:  Negative for abdominal pain (No longer having postop discomfort), blood in stool, constipation and melena.  ?     Healing up pretty well from her hernia surgery.  No GI issues associated with it.  ?Musculoskeletal:  Positive for back pain and joint pain.  ?     Chronic symptoms, but stable.  Right foot seems to be the worst  ?Neurological:  Positive for dizziness and tingling (Bilateral carpal tunnel). Negative for focal weakness and weakness.  ?Psychiatric/Behavioral:  Positive for depression and memory loss. The patient is nervous/anxious. The patient does not have insomnia.   ?     Monitored and managed by psychiatry  ? ?I have reviewed and (if needed) personally updated the patient's problem list, medications, allergies, past medical and surgical history, social and family history.  ? ?PAST MEDICAL HISTORY  ? ?Past Medical  History:  ?Diagnosis Date  ? Allergy   ? Anxiety   ? Arthritis   ? Carpal tunnel syndrome   ? right hand, getting injections  ? Cataract   ? Depression   ? Dysrhythmia   ? BBB  ? Hammer toe   ? Hypothyroidism due to Hashimoto's thyroiditis   ? Meniere disease, right   ? ? ?PAST SURGICAL HISTORY  ? ?Past Surgical History:  ?Procedure Laterality Date  ? APPENDECTOMY    ? COLONOSCOPY    ? CYSTECTOMY    ? eye  ? LAPAROSCOPIC ASSISTED SPIGELIAN HERNIA REPAIR N/A 11/08/2019  ? Procedure: LAPAROSCOPIC ASSISTED SPIGELIAN HERNIA REPAIR WITH MESH;  Surgeon: Donnie Mesa, MD;  Location: [facility];  Service: General;  Laterality: N/A;  ? OOPHORECTOMY  Right   ? TONSILLECTOMY    ? TRANSTHORACIC ECHOCARDIOGRAM  12/09/2019  ? EF 60 to 65%.  No R WMA.  GR 1 DD.  Normal RV function.  Aortic calcification with sclerosis but no suggestion of stenosis.  (Mean gradient 8 mmHg)  ? ?(12/09/2019) Echo: EF 60 to 65%.  No R WMA.  GR 1 DD.  Normal RV function.  Aortic calcification with sclerosis but no suggestion of stenosis.  (Mean gradient 8 mmHg) ? ?Immunization History  ?Administered Date(s) Administered  ? Fluad Quad(high Dose 65+) 07/22/2021  ? Influenza, Seasonal, Injecte, Preservative Fre 11/05/2012  ? Influenza,inj,Quad PF,6+ Mos 06/21/2013, 09/01/2014  ? Influenza-Unspecified 06/21/2016, 06/15/2017  ? PFIZER Comirnaty(Gray Top)Covid-19 Tri-Sucrose Vaccine 05/13/2020, 06/03/2020  ? PFIZER(Purple Top)SARS-COV-2 Vaccination 05/30/2020, 06/03/2020  ? Pneumococcal Conjugate-13 09/04/2016  ? Tdap 03/16/2012  ? ? ?MEDICATIONS/ALLERGIES  ? ?Current Meds  ?Medication Sig  ? atorvastatin (LIPITOR) 10 MG tablet Take 1 tablet (10 mg total) by mouth 2 (two) times a week.  ? Calcium-Vitamin D-Vitamin K (CALCIUM + D + K PO) Take 1 tablet by mouth 2 (two) times daily. 1300 mg+D1000 IU  ? DULoxetine (CYMBALTA) 30 MG capsule TAKE 1 CAPSULE BY MOUTH EVERY DAY  ? fish oil-omega-3 fatty acids 1000 MG capsule Take 1 g by mouth 2 (two) times daily.   ?  Gluc-Chonn-MSM-Boswellia-Vit D (GLUCOSAMINE CHOND TRIPLE/VIT D) TABS Take 1 tablet by mouth 2 (two) times daily.  ? levothyroxine (SYNTHROID) 150 MCG tablet Take 1 tablet (150 mcg total) by mouth daily.  ? na

## 2022-01-20 NOTE — Patient Instructions (Addendum)
Medication Instructions:  ? ? ?*If you need a refill on your cardiac medications before your next appointment, please call your pharmacy* ? ? ?Lab Work: ? ?If you have labs (blood work) drawn today and your tests are completely normal, you will receive your results only by: ?MyChart Message (if you have MyChart) OR ?A paper copy in the mail ?If you have any lab test that is abnormal or we need to change your treatment, we will call you to review the results. ? ? ?Testing/Procedures: ?Will be schedule at [address] - March 2024 ?Your physician has requested that you have an echocardiogram. Echocardiography is a painless test that uses sound waves to create images of your heart. It provides your doctor with information about the size and shape of your heart and how well your heart?s chambers and valves are working. This procedure takes approximately one hour. There are no restrictions for this procedure. ? ?And ? ? CT coronary calcium score.  ? ?Test locations:  ?[facility] ([address] [place], [place] [address]) ?[facility] ([address] [place], [place] [address])  ? ?This is $99 out of pocket. ? ? ?Coronary CalciumScan ?A coronary calcium scan is an imaging test used to look for deposits of calcium and other fatty materials (plaques) in the inner lining of the blood vessels of the heart (coronary arteries). These deposits of calcium and plaques can partly clog and narrow the coronary arteries without producing any symptoms or warning signs. This puts a person at risk for a heart attack. This test can detect these deposits before symptoms develop. ?Tell a health care provider about: ?Any allergies you have. ?All medicines you are taking, including vitamins, herbs, eye drops, creams, and over-the-counter medicines. ?Any problems you or family members have had with anesthetic medicines. ?Any blood disorders you have. ?Any surgeries you have had. ?Any medical conditions  you have. ?Whether you are pregnant or may be pregnant. ?What are the risks? ?Generally, this is a safe procedure. However, problems may occur, including: ?Harm to a pregnant woman and her unborn baby. This test involves the use of radiation. Radiation exposure can be dangerous to a pregnant woman and her unborn baby. If you are pregnant, you generally should not have this procedure done. ?Slight increase in the risk of cancer. This is because of the radiation involved in the test. ?What happens before the procedure? ?No preparation is needed for this procedure. ?What happens during the procedure? ?You will undress and remove any jewelry around your neck or chest. ?You will put on a hospital gown. ?Sticky electrodes will be placed on your chest. The electrodes will be connected to an electrocardiogram (ECG) machine to record a tracing of the electrical activity of your heart. ?A CT scanner will take pictures of your heart. During this time, you will be asked to lie still and hold your breath for 2-3 seconds while a picture of your heart is being taken. ?The procedure may vary among health care providers and hospitals. ?What happens after the procedure? ?You can get dressed. ?You can return to your normal activities. ?It is up to you to get the results of your test. Ask your health care provider, or the department that is doing the test, when your results will be ready. ?Summary ?A coronary calcium scan is an imaging test used to look for deposits of calcium and other fatty materials (plaques) in the inner lining of the blood vessels of the heart (coronary  arteries). ?Generally, this is a safe procedure. Tell your health care provider if you are pregnant or may be pregnant. ?No preparation is needed for this procedure. ?A CT scanner will take pictures of your heart. ?You can return to your normal activities after the scan is done. ?This information is not intended to replace advice given to you by your health care  provider. Make sure you discuss any questions you have with your health care provider. ?Document Released: 03/13/2008 Document Revised: 08/04/2016 Document Reviewed: 08/04/2016 ?Elsevier Interactive Patient Education ? 2017 [place].  ? ?Follow-Up: ?At [facility], you and your health needs are our priority.  As part of our continuing mission to provide you with exceptional heart care, we have created designated Provider Care Teams.  These Care Teams include your primary Cardiologist (physician) and Advanced Practice Providers (APPs -  Physician Assistants and Nurse Practitioners) who all work together to provide you with the care you need, when you need it. ? ?  ? ?Your next appointment:   ?12 month(s) ? ?The format for your next appointment:   ?In Person ? ?Provider:   ?Glenetta Hew, MD  ? ? ? ?

## 2022-01-28 ENCOUNTER — Ambulatory Visit (HOSPITAL_BASED_OUTPATIENT_CLINIC_OR_DEPARTMENT_OTHER)
Admission: RE | Admit: 2022-01-28 | Discharge: 2022-01-28 | Disposition: A | Discharge status: Home / Self Care | Payer: Medicare PPO | Source: Ambulatory Visit | Attending: Cardiology | Admitting: Cardiology

## 2022-01-28 DIAGNOSIS — R9431 Abnormal electrocardiogram [ECG] [EKG]: Secondary | ICD-10-CM

## 2022-01-28 DIAGNOSIS — Z8249 Family history of ischemic heart disease and other diseases of the circulatory system: Secondary | ICD-10-CM

## 2022-01-28 DIAGNOSIS — I4510 Unspecified right bundle-branch block: Secondary | ICD-10-CM

## 2022-01-30 DIAGNOSIS — M25512 Pain in left shoulder: Secondary | ICD-10-CM | POA: Diagnosis not present

## 2022-01-30 DIAGNOSIS — M19012 Primary osteoarthritis, left shoulder: Secondary | ICD-10-CM | POA: Diagnosis not present

## 2022-01-31 DIAGNOSIS — M25571 Pain in right ankle and joints of right foot: Secondary | ICD-10-CM | POA: Diagnosis not present

## 2022-01-31 DIAGNOSIS — M19071 Primary osteoarthritis, right ankle and foot: Secondary | ICD-10-CM | POA: Diagnosis not present

## 2022-01-31 DIAGNOSIS — M79671 Pain in right foot: Secondary | ICD-10-CM | POA: Diagnosis not present

## 2022-01-31 DIAGNOSIS — M6701 Short Achilles tendon (acquired), right ankle: Secondary | ICD-10-CM | POA: Diagnosis not present

## 2022-02-02 ENCOUNTER — Encounter: Payer: Self-pay | Admitting: Cardiology

## 2022-02-02 NOTE — Assessment & Plan Note (Signed)
Benign findings.  Normal echo. ?

## 2022-02-02 NOTE — Assessment & Plan Note (Signed)
Last lipids were from October 2022: TC was 227, TG 146, HDL 71 and LDL 126. ?As a result of his labs, her PCP started her on atorvastatin 10 mg daily. ? ?She will be due for recheck of labs soon. ? ?Recheck Coronary Calcium Score to again risk stratify to determine target ?

## 2022-02-02 NOTE — Assessment & Plan Note (Signed)
In the absence of PND or orthopnea with normal echocardiogram, unlikely to be anything with a venous stasis. ?She was started on HCTZ and this seems to have helped her edema. ?

## 2022-02-02 NOTE — Assessment & Plan Note (Signed)
Murmur on exam.  Mild AS by echo.  Has not changed.  Can recheck in April 2024 which would be a 3-year follow-up.  This can be prior to her annual follow-up with me ?

## 2022-02-02 NOTE — Assessment & Plan Note (Signed)
Evaluated back in 2019 with coronary Score that was 0.  She does have family history, and also has hyperlipidemia and hypertension.  Both are pretty well controlled.  She is non-smoker. ? ?She would very much like to reevaluate her risk, especially since he was just started on a statin. ? ?Plan: Recheck Coronary Calcium Score in the next month or so. => Recommended treatment target range for LDL. ?

## 2022-02-02 NOTE — Assessment & Plan Note (Signed)
Blood pressure looks pretty good today.  She is on Maxide.  Tolerating it well. ?

## 2022-02-03 NOTE — Telephone Encounter (Signed)
NA

## 2022-02-06 DIAGNOSIS — R293 Abnormal posture: Secondary | ICD-10-CM | POA: Diagnosis not present

## 2022-02-06 DIAGNOSIS — M6281 Muscle weakness (generalized): Secondary | ICD-10-CM | POA: Diagnosis not present

## 2022-02-06 DIAGNOSIS — M25612 Stiffness of left shoulder, not elsewhere classified: Secondary | ICD-10-CM | POA: Diagnosis not present

## 2022-02-06 DIAGNOSIS — M25512 Pain in left shoulder: Secondary | ICD-10-CM | POA: Diagnosis not present

## 2022-02-12 DIAGNOSIS — M25512 Pain in left shoulder: Secondary | ICD-10-CM | POA: Diagnosis not present

## 2022-02-12 DIAGNOSIS — M25612 Stiffness of left shoulder, not elsewhere classified: Secondary | ICD-10-CM | POA: Diagnosis not present

## 2022-02-12 DIAGNOSIS — M6281 Muscle weakness (generalized): Secondary | ICD-10-CM | POA: Diagnosis not present

## 2022-02-12 DIAGNOSIS — R293 Abnormal posture: Secondary | ICD-10-CM | POA: Diagnosis not present

## 2022-02-13 DIAGNOSIS — R293 Abnormal posture: Secondary | ICD-10-CM | POA: Diagnosis not present

## 2022-02-13 DIAGNOSIS — M25612 Stiffness of left shoulder, not elsewhere classified: Secondary | ICD-10-CM | POA: Diagnosis not present

## 2022-02-13 DIAGNOSIS — M6281 Muscle weakness (generalized): Secondary | ICD-10-CM | POA: Diagnosis not present

## 2022-02-13 DIAGNOSIS — M25512 Pain in left shoulder: Secondary | ICD-10-CM | POA: Diagnosis not present

## 2022-02-18 DIAGNOSIS — M25612 Stiffness of left shoulder, not elsewhere classified: Secondary | ICD-10-CM | POA: Diagnosis not present

## 2022-02-18 DIAGNOSIS — M6281 Muscle weakness (generalized): Secondary | ICD-10-CM | POA: Diagnosis not present

## 2022-02-18 DIAGNOSIS — R293 Abnormal posture: Secondary | ICD-10-CM | POA: Diagnosis not present

## 2022-02-18 DIAGNOSIS — M25512 Pain in left shoulder: Secondary | ICD-10-CM | POA: Diagnosis not present

## 2022-02-21 DIAGNOSIS — M25612 Stiffness of left shoulder, not elsewhere classified: Secondary | ICD-10-CM | POA: Diagnosis not present

## 2022-02-21 DIAGNOSIS — R293 Abnormal posture: Secondary | ICD-10-CM | POA: Diagnosis not present

## 2022-02-21 DIAGNOSIS — M6281 Muscle weakness (generalized): Secondary | ICD-10-CM | POA: Diagnosis not present

## 2022-02-21 DIAGNOSIS — M25512 Pain in left shoulder: Secondary | ICD-10-CM | POA: Diagnosis not present

## 2022-02-25 DIAGNOSIS — M6281 Muscle weakness (generalized): Secondary | ICD-10-CM | POA: Diagnosis not present

## 2022-02-25 DIAGNOSIS — M25512 Pain in left shoulder: Secondary | ICD-10-CM | POA: Diagnosis not present

## 2022-02-25 DIAGNOSIS — R293 Abnormal posture: Secondary | ICD-10-CM | POA: Diagnosis not present

## 2022-02-25 DIAGNOSIS — M25612 Stiffness of left shoulder, not elsewhere classified: Secondary | ICD-10-CM | POA: Diagnosis not present

## 2022-02-26 ENCOUNTER — Ambulatory Visit: Payer: Medicare PPO | Admitting: Family Medicine

## 2022-02-26 ENCOUNTER — Encounter: Payer: Self-pay | Admitting: Family Medicine

## 2022-02-26 VITALS — BP 122/70 | HR 79 | Temp 97.8°F | Resp 16 | Ht 65.0 in | Wt 195.6 lb

## 2022-02-26 DIAGNOSIS — E039 Hypothyroidism, unspecified: Secondary | ICD-10-CM | POA: Diagnosis not present

## 2022-02-26 DIAGNOSIS — R519 Headache, unspecified: Secondary | ICD-10-CM

## 2022-02-26 DIAGNOSIS — R42 Dizziness and giddiness: Secondary | ICD-10-CM

## 2022-02-26 NOTE — Progress Notes (Signed)
Subjective:  Patient ID: Elizabeth Lin, female    DOB: March 31, 1946  Age: 76 y.o. MRN: [id number]  CC:  Chief Complaint  Patient presents with   Referral    Pt here for referral for vestibular therapy for stenosis present in her neck pt was advised to ask for this by PT provider     HPI Elizabeth Lin presents for   Lightheadedness: Started in past 3 months. Not dizzy, room not spinning. Sometimes feels off balance. Daily headache past 6 months, thought to be due to eyes, mild, strain of the eyes. Better with rest. Straining to see - has cataracts. Worse vision past 6 months - [facility] eye appt in next month. No loss of vision. Just blurry.  No focal weakness, facial droop or slurred speech.  No CP, palpitations. Mild aortic stenosis, cardiology eval 01/20/22. Plan for repeat echo in April 2024.  Carotid US 05/2013. No hemodynamically significant plaque seen. Negative for hemodynamically significant stenosis involving the extracranial carotid arteries bilaterally. CCS score 0 01/28/22.  Lightheaded at rest at times, seated. Worse with turning quick or getting up quickly.  No melena/hematochezia.  Hx of meniere's, treated by ENT - prior pressure in ears. On maxzide for meniere's  TSH, lipids, cmp in October.  3 meals per day, snacks. More vegetables. Room for more water intake  Sensorineural hearing loss, wears hearing aids.  Audiology eval May 4 with new battery ordered.  Plan for repeat hearing test in 4 to 6 weeks with new battery and live speech mapping.    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   Meniere disease, left 12/12/2017   Mild aortic stenosis 11/10/2017   Bilateral edema of lower extremity 11/10/2017   Family history of premature CAD 11/10/2017   Arthralgia of multiple joints 10/23/2017   Class 1 obesity due to excess calories without serious comorbidity with body mass index (BMI) of 32.0 to 32.9 in adult 10/23/2017    Essential hypertension 10/23/2017   Right bundle branch block (RBBB) 10/23/2017   Pure hypercholesterolemia 10/23/2017   Vitamin D deficiency 09/06/2014   Right knee pain 02/24/2013   Hypothyroid 11/02/2011   Depression 11/02/2011   History of positive PPD 11/02/2011   Past Medical History:  Diagnosis Date   Allergy    Anxiety    Arthritis    Carpal tunnel syndrome    right hand, getting injections   Cataract    Depression    Dysrhythmia    BBB   Hammer toe    Hypothyroidism due to Hashimoto's thyroiditis    Meniere disease, right    Past Surgical History:  Procedure Laterality Date   APPENDECTOMY     COLONOSCOPY     CYSTECTOMY     eye   LAPAROSCOPIC ASSISTED SPIGELIAN HERNIA REPAIR N/A 11/08/2019   Procedure: LAPAROSCOPIC ASSISTED SPIGELIAN HERNIA REPAIR WITH MESH;  Surgeon: Manus Rudd, MD;  Location: [facility];  Service: General;  Laterality: N/A;   OOPHORECTOMY Right    TONSILLECTOMY     TRANSTHORACIC ECHOCARDIOGRAM  12/09/2019   EF 60 to 65%.  No R WMA.  GR 1 DD.  Normal RV function.  Aortic calcification with sclerosis but no suggestion of stenosis.  (Mean gradient 8 mmHg)   Allergies  Allergen Reactions   Penicillins Rash    Did it involve swelling of the face/tongue/throat, SOB, or low BP? No Did it involve sudden or severe rash/hives, skin peeling, or any  reaction on the inside of your mouth or nose? Yes Did you need to seek medical attention at a hospital or doctor's office? No When did it last happen?  ~6-10 years ago     If all above answers are "NO", may proceed with cephalosporin use.    Amoxicillin-Pot Clavulanate     Red rash Did it involve swelling of the face/tongue/throat, SOB, or low BP? No Did it involve sudden or severe rash/hives, skin peeling, or any reaction on the inside of your mouth or nose? Yes Did you need to seek medical attention at a hospital or doctor's office? No When did it last happen? ~6-10 years ago    If all above answers  are "NO", may proceed with cephalosporin use.    Sulfa Antibiotics    Prior to Admission medications   Medication Sig Start Date End Date Taking? Authorizing Provider  atorvastatin (LIPITOR) 10 MG tablet Take 1 tablet (10 mg total) by mouth 2 (two) times a week. 07/22/21  Yes Shade Flood, MD  Calcium-Vitamin D-Vitamin K (CALCIUM + D + K PO) Take 1 tablet by mouth 2 (two) times daily. 1300 mg+D1000 IU   Yes [provider]  DULoxetine (CYMBALTA) 30 MG capsule TAKE 1 CAPSULE BY MOUTH EVERY DAY 07/22/21  Yes Shade Flood, MD  fish oil-omega-3 fatty acids 1000 MG capsule Take 1 g by mouth 2 (two) times daily.    Yes [provider]  Gluc-Chonn-MSM-Boswellia-Vit D (GLUCOSAMINE CHOND TRIPLE/VIT D) TABS Take 1 tablet by mouth 2 (two) times daily.   Yes [provider]  levothyroxine (SYNTHROID) 150 MCG tablet Take 1 tablet (150 mcg total) by mouth daily. 07/22/21  Yes Shade Flood, MD  naproxen sodium (ALEVE) 220 MG tablet Take 220-440 mg by mouth 2 (two) times daily as needed (pain/soreness).   Yes [provider]  Polyethyl Glycol-Propyl Glycol (LUBRICANT EYE DROPS) 0.4-0.3 % SOLN Place 1 drop into both eyes 3 (three) times daily as needed (dry/irritated eyes.). Rohto Ice Multi-Symptom Relief   Yes [provider]  triamterene-hydrochlorothiazide (MAXZIDE-25) 37.5-25 MG tablet Take 1 tablet by mouth daily. 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  3 children, 3 grandchildren.   Patient's  Education: [employer].  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   Patient is right handed   Caffeine consumption 2-3 daily    Walks daily for roughly 30 minutes at a time.  She has not been doing it in the wintertime due to cold weather.      Social Determinants  of Health   Financial Resource Strain: Not on file  Food Insecurity: Not on file  Transportation Needs: Not on file  Physical Activity: Not on file  Stress: Not on file  Social Connections: Not on file  Intimate Partner Violence: Not on file    Review of Systems Per HPI.   Objective:   Vitals:   02/26/22 1524  BP: 122/70  Pulse: 79  Resp: 16  Temp: 97.8 F (36.6 C)  TempSrc: Temporal  SpO2: 95%  Weight: 195 lb 9.6 oz (88.7 kg)  Height:  (1.651 m)    Physical Exam Vitals reviewed.  Constitutional:      Appearance: Normal appearance. She is well-developed.  HENT:     Head: Normocephalic and atraumatic.  Eyes:     General: No visual field deficit.    Extraocular Movements:     Left eye: Abnormal extraocular motion (Feels dizzy with looking left and upward, she closes right eye when looking left to minimize symptoms.) present.     Conjunctiva/sclera: Conjunctivae normal.     Pupils: Pupils are equal, round, and reactive to light.  Neck:     Vascular: No carotid bruit.  Cardiovascular:     Rate and Rhythm: Normal rate and regular rhythm.     Heart sounds: Normal heart sounds.  Pulmonary:     Effort: Pulmonary effort is normal.     Breath sounds: Normal breath sounds.  Abdominal:     Palpations: Abdomen is soft. There is no pulsatile  mass.     Tenderness: There is no abdominal tenderness.  Musculoskeletal:     Right lower leg: No edema.     Left lower leg: No edema.  Skin:    General: Skin is warm and dry.  Neurological:     General: No focal deficit present.     Mental Status: She is alert and oriented to person, place, and time.     GCS: GCS eye subscore is 4. GCS verbal subscore is 5. GCS motor subscore is 6.     Cranial Nerves: No cranial nerve deficit, dysarthria or facial asymmetry.     Motor: Motor function is intact. No weakness, tremor or pronator drift.     Coordination: Romberg sign positive (Slight unsteadiness initially but self corrects.). Coordination normal. Finger-Nose-Finger Test and Heel to [facility] Test normal.     Gait: Gait is intact.  Psychiatric:        Mood and Affect: Mood normal.        Behavior: Behavior normal.    EKG, sinus bradycardia with rate 55, first-degree AV block with few ectopic beats.  Right bundle branch block.  No apparent significant changes from 11/08/2019 EKG with cardiology.  Other than ectopic beats.  52 minutes spent during visit, including chart review, prior carotid doppler review, neuro exam,  counseling and assimilation of information, other exam, discussion of plan, and chart completion.    Assessment & Plan:  JAVIANA ANWAR is a 76 y.o. female . Lightheadedness - Plan: CBC, Comprehensive metabolic panel, TSH, CT HEAD WO CONTRAST ( ), EKG 12-Lead  New onset headache - Plan: CBC, Comprehensive metabolic panel, TSH, EKG 12-Lead  Hypothyroidism, unspecified type - Plan: TSH  Few ectopic beats on EKG but no apparent acute findings otherwise.  Mild aortic stenosis, unlikely cause of lightheadedness.  Potentially could be related to fluid intake, will increase water intake.  History of Mnire's, but not typical symptoms of middle ear lightheadedness/dizziness.  May have a mixed picture  with some middle ear component.  No focal neurologic findings except with  extraocular muscle testing which she states has been a chronic issue.  Daily headache may be related to her blurry vision/visual symptoms but is something new.  -Check CBC, TSH with history of hypothyroidism, CMP, CT head  -Recheck 2 weeks with ER/RTC precautions given.  -will contact cardiology to see if they need to evaluate her as well from cardiac perspective, will consider that referral next visit if needed.  No orders of the defined types were placed in this encounter.  Patient Instructions  I will check a CT scan of the head as well as blood work.  Try to increase your water intake.  Follow-up in 2 weeks to discuss labs and results of studies as well as neck step in the lightheadedness.  If any acute worsening or new symptoms be seen right away.  Dizziness Dizziness is a common problem. It is a feeling of unsteadiness or light-headedness. You may feel like you are about to faint. Dizziness can lead to injury if you stumble or fall. Anyone can become dizzy, but dizziness is more common in older adults. This condition can be caused by a number of things, including medicines, dehydration, or illness. Follow these instructions at home: Eating and drinking  Drink enough fluid to keep your urine pale yellow. This helps to keep you from becoming dehydrated. Try to drink more clear fluids, such as water. Do not drink alcohol. Limit your caffeine intake if told to do so by your health care provider. Check ingredients and nutrition facts to see if a food or beverage contains caffeine. Limit your salt (sodium) intake if told to do so by your health care provider. Check ingredients and nutrition facts to see if a food or beverage contains sodium. Activity  Avoid making quick movements. Rise slowly from chairs and steady yourself until you feel okay. In the morning, first sit up on the side of the bed. When you feel okay, stand slowly while you hold onto something until you know that your balance is  good. If you need to stand in one place for a long time, move your legs often. Tighten and relax the muscles in your legs while you are standing. Do not drive or use machinery if you feel dizzy. Avoid bending down if you feel dizzy. Place items in your home so that they are easy for you to reach without leaning over. Lifestyle Do not use any products that contain nicotine or tobacco. These products include cigarettes, chewing tobacco, and vaping devices, such as e-cigarettes. If you need help quitting, ask your health care provider. Try to reduce your stress level by using methods such as yoga or meditation. Talk with your health care provider if you need help to manage your stress. General instructions Watch your dizziness for any changes. Take over-the-counter and prescription medicines only as told by your health care provider. Talk with your health care provider if you think that your dizziness is caused by a medicine that you are taking. Tell a friend or a family member that you are feeling dizzy. If he or she notices any changes in your behavior, have this person call your health care provider. Keep all follow-up visits. This is important. Contact a health care provider if: Your dizziness does not go away or you have new symptoms. Your dizziness or light-headedness gets worse. You feel nauseous. You have reduced hearing. You have a fever. You have neck pain or a  stiff neck. Your dizziness leads to an injury or a fall. Get help right away if: You vomit or have diarrhea and are unable to eat or drink anything. You have problems talking, walking, swallowing, or using your arms, hands, or legs. You feel generally weak. You have any bleeding. You are not thinking clearly or you have trouble forming sentences. It may take a friend or family member to notice this. You have chest pain, abdominal pain, shortness of breath, or sweating. Your vision changes or you develop a severe  headache. These symptoms may represent a serious problem that is an emergency. Do not wait to see if the symptoms will go away. Get medical help right away. Call your local emergency services (911 in the [place] Do not drive yourself to the hospital. Summary Dizziness is a feeling of unsteadiness or light-headedness. This condition can be caused by a number of things, including medicines, dehydration, or illness. Anyone can become dizzy, but dizziness is more common in older adults. Drink enough fluid to keep your urine pale yellow. Do not drink alcohol. Avoid making quick movements if you feel dizzy. Monitor your dizziness for any changes. This information is not intended to replace advice given to you by your health care provider. Make sure you discuss any questions you have with your health care provider. Document Revised: 08/20/2020 Document Reviewed: 08/20/2020 Elsevier Patient Education  2023 Else[employer] Inc.     Signed,   Meredith StaggersJeffrey Zyree Traynham, MD [facility], [facility] 02/26/22 4:57 PM

## 2022-02-26 NOTE — Patient Instructions (Addendum)
I will check a CT scan of the head as well as blood work.  Try to increase your water intake.  Follow-up in 2 weeks to discuss labs and results of studies as well as neck step in the lightheadedness.  If any acute worsening or new symptoms be seen right away.  Dizziness Dizziness is a common problem. It is a feeling of unsteadiness or light-headedness. You may feel like you are about to faint. Dizziness can lead to injury if you stumble or fall. Anyone can become dizzy, but dizziness is more common in older adults. This condition can be caused by a number of things, including medicines, dehydration, or illness. Follow these instructions at home: Eating and drinking  Drink enough fluid to keep your urine pale yellow. This helps to keep you from becoming dehydrated. Try to drink more clear fluids, such as water. Do not drink alcohol. Limit your caffeine intake if told to do so by your health care provider. Check ingredients and nutrition facts to see if a food or beverage contains caffeine. Limit your salt (sodium) intake if told to do so by your health care provider. Check ingredients and nutrition facts to see if a food or beverage contains sodium. Activity  Avoid making quick movements. Rise slowly from chairs and steady yourself until you feel okay. In the morning, first sit up on the side of the bed. When you feel okay, stand slowly while you hold onto something until you know that your balance is good. If you need to stand in one place for a long time, move your legs often. Tighten and relax the muscles in your legs while you are standing. Do not drive or use machinery if you feel dizzy. Avoid bending down if you feel dizzy. Place items in your home so that they are easy for you to reach without leaning over. Lifestyle Do not use any products that contain nicotine or tobacco. These products include cigarettes, chewing tobacco, and vaping devices, such as e-cigarettes. If you need help  quitting, ask your health care provider. Try to reduce your stress level by using methods such as yoga or meditation. Talk with your health care provider if you need help to manage your stress. General instructions Watch your dizziness for any changes. Take over-the-counter and prescription medicines only as told by your health care provider. Talk with your health care provider if you think that your dizziness is caused by a medicine that you are taking. Tell a friend or a family member that you are feeling dizzy. If he or she notices any changes in your behavior, have this person call your health care provider. Keep all follow-up visits. This is important. Contact a health care provider if: Your dizziness does not go away or you have new symptoms. Your dizziness or light-headedness gets worse. You feel nauseous. You have reduced hearing. You have a fever. You have neck pain or a stiff neck. Your dizziness leads to an injury or a fall. Get help right away if: You vomit or have diarrhea and are unable to eat or drink anything. You have problems talking, walking, swallowing, or using your arms, hands, or legs. You feel generally weak. You have any bleeding. You are not thinking clearly or you have trouble forming sentences. It may take a friend or family member to notice this. You have chest pain, abdominal pain, shortness of breath, or sweating. Your vision changes or you develop a severe headache. These symptoms may represent a serious problem  that is an emergency. Do not wait to see if the symptoms will go away. Get medical help right away. Call your local emergency services (911 in the [place] Do not drive yourself to the hospital. Summary Dizziness is a feeling of unsteadiness or light-headedness. This condition can be caused by a number of things, including medicines, dehydration, or illness. Anyone can become dizzy, but dizziness is more common in older adults. Drink enough fluid to  keep your urine pale yellow. Do not drink alcohol. Avoid making quick movements if you feel dizzy. Monitor your dizziness for any changes. This information is not intended to replace advice given to you by your health care provider. Make sure you discuss any questions you have with your health care provider. Document Revised: 08/20/2020 Document Reviewed: 08/20/2020 Elsevier Patient Education  2023 [employer].

## 2022-02-27 DIAGNOSIS — M6281 Muscle weakness (generalized): Secondary | ICD-10-CM | POA: Diagnosis not present

## 2022-02-27 DIAGNOSIS — R293 Abnormal posture: Secondary | ICD-10-CM | POA: Diagnosis not present

## 2022-02-27 DIAGNOSIS — M25512 Pain in left shoulder: Secondary | ICD-10-CM | POA: Diagnosis not present

## 2022-02-27 DIAGNOSIS — M25612 Stiffness of left shoulder, not elsewhere classified: Secondary | ICD-10-CM | POA: Diagnosis not present

## 2022-02-27 LAB — CBC
HCT: 43.2 % (ref 36.0–46.0)
Hemoglobin: 14.3 g/dL (ref 12.0–15.0)
MCHC: 33 g/dL (ref 30.0–36.0)
MCV: 86.9 fl (ref 78.0–100.0)
Platelets: 267 10*3/uL (ref 150.0–400.0)
RBC: 4.97 Mil/uL (ref 3.87–5.11)
RDW: 15.2 % (ref 11.5–15.5)
WBC: 7.8 10*3/uL (ref 4.0–10.5)

## 2022-02-27 LAB — COMPREHENSIVE METABOLIC PANEL
ALT: 25 U/L (ref 0–35)
AST: 25 U/L (ref 0–37)
Albumin: 4.3 g/dL (ref 3.5–5.2)
Alkaline Phosphatase: 65 U/L (ref 39–117)
BUN: 28 mg/dL — ABNORMAL HIGH (ref 6–23)
CO2: 29 mEq/L (ref 19–32)
Calcium: 9.6 mg/dL (ref 8.4–10.5)
Chloride: 97 mEq/L (ref 96–112)
Creatinine, Ser: 0.9 mg/dL (ref 0.40–1.20)
GFR: 62.13 mL/min (ref >60.00)
Glucose, Bld: 73 mg/dL (ref 70–99)
Potassium: 4.1 mEq/L (ref 3.5–5.1)
Sodium: 136 mEq/L (ref 135–145)
Total Bilirubin: 0.8 mg/dL (ref 0.2–1.2)
Total Protein: 6.9 g/dL (ref 6.0–8.3)

## 2022-02-27 LAB — TSH: TSH: 5.4 u[IU]/mL (ref 0.35–5.50)

## 2022-03-04 DIAGNOSIS — M25512 Pain in left shoulder: Secondary | ICD-10-CM | POA: Diagnosis not present

## 2022-03-04 DIAGNOSIS — M6281 Muscle weakness (generalized): Secondary | ICD-10-CM | POA: Diagnosis not present

## 2022-03-04 DIAGNOSIS — M25612 Stiffness of left shoulder, not elsewhere classified: Secondary | ICD-10-CM | POA: Diagnosis not present

## 2022-03-04 DIAGNOSIS — R293 Abnormal posture: Secondary | ICD-10-CM | POA: Diagnosis not present

## 2022-03-07 ENCOUNTER — Ambulatory Visit (INDEPENDENT_AMBULATORY_CARE_PROVIDER_SITE_OTHER)
Admission: RE | Admit: 2022-03-07 | Discharge: 2022-03-07 | Disposition: A | Discharge status: Home / Self Care | Payer: Medicare PPO | Source: Ambulatory Visit | Attending: Family Medicine | Admitting: Family Medicine

## 2022-03-07 DIAGNOSIS — M25612 Stiffness of left shoulder, not elsewhere classified: Secondary | ICD-10-CM | POA: Diagnosis not present

## 2022-03-07 DIAGNOSIS — R42 Dizziness and giddiness: Secondary | ICD-10-CM

## 2022-03-07 DIAGNOSIS — M25512 Pain in left shoulder: Secondary | ICD-10-CM | POA: Diagnosis not present

## 2022-03-07 DIAGNOSIS — R519 Headache, unspecified: Secondary | ICD-10-CM | POA: Diagnosis not present

## 2022-03-07 DIAGNOSIS — R293 Abnormal posture: Secondary | ICD-10-CM | POA: Diagnosis not present

## 2022-03-07 DIAGNOSIS — M6281 Muscle weakness (generalized): Secondary | ICD-10-CM | POA: Diagnosis not present

## 2022-03-11 DIAGNOSIS — H2513 Age-related nuclear cataract, bilateral: Secondary | ICD-10-CM | POA: Diagnosis not present

## 2022-03-12 ENCOUNTER — Other Ambulatory Visit: Payer: Self-pay | Admitting: Family Medicine

## 2022-03-12 DIAGNOSIS — M25512 Pain in left shoulder: Secondary | ICD-10-CM | POA: Diagnosis not present

## 2022-03-12 DIAGNOSIS — F32A Depression, unspecified: Secondary | ICD-10-CM

## 2022-03-12 DIAGNOSIS — M25612 Stiffness of left shoulder, not elsewhere classified: Secondary | ICD-10-CM | POA: Diagnosis not present

## 2022-03-12 DIAGNOSIS — M6281 Muscle weakness (generalized): Secondary | ICD-10-CM | POA: Diagnosis not present

## 2022-03-12 DIAGNOSIS — R293 Abnormal posture: Secondary | ICD-10-CM | POA: Diagnosis not present

## 2022-03-13 ENCOUNTER — Ambulatory Visit: Payer: Medicare PPO | Admitting: Family Medicine

## 2022-03-13 ENCOUNTER — Encounter: Payer: Self-pay | Admitting: Family Medicine

## 2022-03-13 VITALS — BP 126/78 | HR 76 | Temp 98.1°F | Resp 16 | Ht 65.0 in | Wt 195.6 lb

## 2022-03-13 DIAGNOSIS — I10 Essential (primary) hypertension: Secondary | ICD-10-CM

## 2022-03-13 DIAGNOSIS — R42 Dizziness and giddiness: Secondary | ICD-10-CM

## 2022-03-13 DIAGNOSIS — B351 Tinea unguium: Secondary | ICD-10-CM | POA: Diagnosis not present

## 2022-03-13 DIAGNOSIS — R0789 Other chest pain: Secondary | ICD-10-CM | POA: Diagnosis not present

## 2022-03-13 DIAGNOSIS — F32A Depression, unspecified: Secondary | ICD-10-CM

## 2022-03-13 MED ORDER — HYDROCHLOROTHIAZIDE 25 MG PO TABS: 25.0000 mg | ORAL_TABLET | Freq: Every day | ORAL | 1 refills | Status: DC

## 2022-03-13 MED ORDER — DULOXETINE HCL 20 MG PO CPEP: 40.0000 mg | ORAL_CAPSULE | Freq: Every day | ORAL | 3 refills | Status: DC

## 2022-03-13 NOTE — Patient Instructions (Addendum)
Stop triamterene hctz, start just hydrochlorothiazide once per day. If meniere's symptoms worsen, return to prior med.  I will refer you to cardiologist to discuss a Zio patch monitor and chest pressure. .  We can consider a medication for the toenail fungus when you are ready.  Try higher dose of cymbalta at 40mg  total per day - make this change in next 1-2 weeks. Return to 30mg  if you do not tolerate that dose  If any return of pain the toes, follow up and we can discuss further.    How to Take Your Blood Pressure Blood pressure is a measurement of how strongly your blood is pressing against the walls of your arteries. Arteries are blood vessels that carry blood from your heart throughout your body. Your health care provider takes your blood pressure at each office visit. You can also take your own blood pressure at home with a blood pressure monitor. You may need to take your own blood pressure to: Confirm a diagnosis of high blood pressure (hypertension). Monitor your blood pressure over time. Make sure your blood pressure medicine is working. Supplies needed: Blood pressure monitor. A chair to sit in. This should be a chair where you can sit upright with your back supported. Do not sit on a soft couch or an armchair. Table or desk. Small notebook and pencil or pen. How to prepare To get the most accurate reading, avoid the following for 30 minutes before you check your blood pressure: Drinking caffeine. Drinking alcohol. Eating. Smoking. Exercising. Five minutes before you check your blood pressure: Use the bathroom and urinate so that you have an empty bladder. Sit quietly in a chair. Do not talk. How to take your blood pressure To check your blood pressure, follow the instructions in the manual that came with your blood pressure monitor. If you have a digital blood pressure monitor, the instructions may be as follows: Sit up straight in a chair. Place your feet on the floor. Do  not cross your ankles or legs. Rest your left arm at the level of your heart on a table or desk or on the arm of a chair. Pull up your shirt sleeve. Wrap the blood pressure cuff around the upper part of your left arm, 1 inch (2.5 cm) above your elbow. It is best to wrap the cuff around bare skin. Fit the cuff snugly, but not too tightly, around your arm. You should be able to place only one finger between the cuff and your arm. Position the cord so that it rests in the bend of your elbow. Press the power button. Sit quietly while the cuff inflates and deflates. Read the digital reading on the monitor screen and write the numbers down (record them) in a notebook. Wait 2-3 minutes, then repeat the steps, starting at step 1. What does my blood pressure reading mean? A blood pressure reading consists of a higher number over a lower number. Ideally, your blood pressure should be below 120/80. The first ("top") number is called the systolic pressure. It is a measure of the pressure in your arteries as your heart beats. The second ("bottom") number is called the diastolic pressure. It is a measure of the pressure in your arteries as the heart relaxes. Blood pressure is classified into four stages. The following are the stages for adults who do not have a short-term serious illness or a chronic condition. Systolic pressure and diastolic pressure are measured in a unit called mm Hg (millimeters of  mercury).  Normal Systolic pressure: below 120. Diastolic pressure: below 80. Elevated Systolic pressure: 120-129. Diastolic pressure: below 80. Hypertension stage 1 Systolic pressure: 130-139. Diastolic pressure: 80-89. Hypertension stage 2 Systolic pressure: 140 or above. Diastolic pressure: 90 or above. You can have elevated blood pressure or hypertension even if only the systolic or only the diastolic number in your reading is higher than normal. Follow these instructions at home: Medicines Take  over-the-counter and prescription medicines only as told by your health care provider. Tell your health care provider if you are having any side effects from blood pressure medicine. General instructions Check your blood pressure as often as recommended by your health care provider. Check your blood pressure at the same time every day. Take your monitor to the next appointment with your health care provider to make sure that: You are using it correctly. It provides accurate readings. Understand what your goal blood pressure numbers are. Keep all follow-up visits. This is important. General tips Your health care provider can suggest a reliable monitor that will meet your needs. There are several types of home blood pressure monitors. Choose a monitor that has an arm cuff. Do not choose a monitor that measures your blood pressure from your wrist or finger. Choose a cuff that wraps snugly, not too tight or too loose, around your upper arm. You should be able to fit only one finger between your arm and the cuff. You can buy a blood pressure monitor at most drugstores or online. Where to find more information American Heart Association: www.heart.org Contact a health care provider if: Your blood pressure is consistently high. Your blood pressure is suddenly low. Get help right away if: Your systolic blood pressure is higher than 180. Your diastolic blood pressure is higher than 120. These symptoms may be an emergency. Get help right away. Call 911. Do not wait to see if the symptoms will go away. Do not drive yourself to the hospital. Summary Blood pressure is a measurement of how strongly your blood is pressing against the walls of your arteries. A blood pressure reading consists of a higher number over a lower number. Ideally, your blood pressure should be below 120/80. Check your blood pressure at the same time every day. Avoid caffeine, alcohol, smoking, and exercise for 30 minutes prior  to checking your blood pressure. These agents can affect the accuracy of the blood pressure reading. This information is not intended to replace advice given to you by your health care provider. Make sure you discuss any questions you have with your health care provider. Document Revised: 05/30/2021 Document Reviewed: 05/30/2021 Elsevier Patient Education  2023 [employer].

## 2022-03-13 NOTE — Progress Notes (Signed)
Subjective:  Patient ID: Elizabeth Lin, female    DOB: 1946-04-22  Age: 76 y.o. MRN: [id number]  CC:  Chief Complaint  Patient presents with   Dizziness    Pt notes about the same possibly slightly improved, notes she has been trying to hydrate well, did have CAT scan done between last visit  and would like to discuss results    Nail Problem    Notes some discoloration of toe great toe of Rt foot. Notes occasional pain in it but has been present about 1 month     HPI Elizabeth Lin presents for   Dizziness follow-up. Last seen May 31, few ectopic beats on EKG but no apparent acute findings.  History of mild aortic stenosis but unlikely cause of her lightheadedness.  Increase water intake recommended.  History of Mnire's but not typical symptoms of middle ear lightheadedness/dizziness.  No focal neurologic findings or apparent new neurologic findings.  Did have some daily headaches, CT head ordered, CBC, TSH, CMP overall reassuring also briefly discussed with cardiology. Feeling better since last visit - less dizzy - increased water intake. Still some episodes, but not as much.  HA's have improved. No palpitations/chest pains. Slight pressure on r side of chest, lasts 1 minute, resolves abruptly, once every 2 weeks. At rest.  Nausea on occasion, no vomiting, few times per week.   Depression: Cymbalta 30mg  QD,  Took extra dose once day and felt better - more energy. no new side effects. Would like to try higher dose temporarily.      02/26/2022    3:28 PM 07/22/2021   10:24 AM 07/22/2021   10:20 AM 02/11/2021    4:32 PM 01/21/2021   10:20 AM  Depression screen PHQ 2/9  Decreased Interest 0 0 0 0 0  Down, Depressed, Hopeless 0 0 0 0 0  PHQ - 2 Score 0 0 0 0 0  Altered sleeping  3     Tired, decreased energy  2     Change in appetite  0     Feeling bad or failure about yourself   0     Trouble concentrating  0     Moving slowly or fidgety/restless  0     Suicidal  thoughts  0     PHQ-9 Score  5           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   Meniere disease, left 12/12/2017   Mild aortic stenosis 11/10/2017   Bilateral edema of lower extremity 11/10/2017   Family history of premature CAD 11/10/2017   Arthralgia of multiple joints 10/23/2017   Class 1 obesity due to excess calories without serious comorbidity with body mass index (BMI) of 32.0 to 32.9 in adult 10/23/2017   Essential hypertension 10/23/2017   Right bundle branch block (RBBB) 10/23/2017   Pure hypercholesterolemia 10/23/2017   Vitamin D deficiency 09/06/2014   Right knee pain 02/24/2013   Hypothyroid 11/02/2011   Depression 11/02/2011   History of positive PPD 11/02/2011   Past Medical History:  Diagnosis Date   Allergy    Anxiety    Arthritis    Carpal tunnel syndrome    right hand, getting injections   Cataract    Depression    Dysrhythmia    BBB   Hammer toe    Hypothyroidism due to Hashimoto's thyroiditis    Meniere disease, right    Past Surgical History:  Procedure Laterality Date   APPENDECTOMY     COLONOSCOPY     CYSTECTOMY     eye   LAPAROSCOPIC ASSISTED SPIGELIAN HERNIA REPAIR N/A 11/08/2019   Procedure: LAPAROSCOPIC ASSISTED SPIGELIAN HERNIA REPAIR WITH MESH;  Surgeon: Manus Rudd, MD;  Location: [facility];  Service: General;  Laterality: N/A;   OOPHORECTOMY Right    TONSILLECTOMY     TRANSTHORACIC ECHOCARDIOGRAM  12/09/2019   EF 60 to 65%.  No R WMA.  GR 1 DD.  Normal RV function.  Aortic calcification with sclerosis but no suggestion of stenosis.  (Mean gradient 8 mmHg)   Allergies  Allergen Reactions   Penicillins Rash    Did it involve swelling of the face/tongue/throat, SOB, or low BP? No Did it involve sudden or severe rash/hives, skin peeling, or any reaction on the inside of your mouth or nose? Yes Did you need to seek medical attention at a hospital or doctor's office? No When did  it last happen?  ~6-10 years ago     If all above answers are "NO", may proceed with cephalosporin use.    Amoxicillin-Pot Clavulanate     Red rash Did it involve swelling of the face/tongue/throat, SOB, or low BP? No Did it involve sudden or severe rash/hives, skin peeling, or any reaction on the inside of your mouth or nose? Yes Did you need to seek medical attention at a hospital or doctor's office? No When did it last happen? ~6-10 years ago    If all above answers are "NO", may proceed with cephalosporin use.    Sulfa Antibiotics    Prior to Admission medications   Medication Sig Start Date End Date Taking? Authorizing Provider  atorvastatin (LIPITOR) 10 MG tablet Take 1 tablet (10 mg total) by mouth 2 (two) times a week. 07/22/21  Yes Shade Flood, MD  Calcium-Vitamin D-Vitamin K (CALCIUM + D + K PO) Take 1 tablet by mouth 2 (two) times daily. 1300 mg+D1000 IU   Yes [provider]  DULoxetine (CYMBALTA) 30 MG capsule TAKE 1 CAPSULE BY MOUTH EVERY DAY 03/12/22  Yes Shade Flood, MD  fish oil-omega-3 fatty acids 1000 MG capsule Take 1 g by mouth 2 (two) times daily.    Yes [provider]  Gluc-Chonn-MSM-Boswellia-Vit D (GLUCOSAMINE CHOND TRIPLE/VIT D) TABS Take 1 tablet by mouth 2 (two) times daily.   Yes [provider]  levothyroxine (SYNTHROID) 150 MCG tablet Take 1 tablet (150 mcg total) by mouth daily. 07/22/21  Yes Shade Flood, MD  naproxen sodium (ALEVE) 220 MG tablet Take 220-440 mg by mouth 2 (two) times daily as needed (pain/soreness).   Yes [provider]  Polyethyl Glycol-Propyl Glycol (LUBRICANT EYE DROPS) 0.4-0.3 % SOLN Place 1 drop into both eyes 3 (three) times daily as needed (dry/irritated eyes.). Rohto Ice Multi-Symptom Relief   Yes [provider]  triamterene-hydrochlorothiazide (MAXZIDE-25) 37.5-25 MG tablet Take 1 tablet by mouth daily. 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  3 children, 3 grandchildren.   Patient's  Education: [employer].  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   Patient is right handed   Caffeine consumption 2-3 daily    Walks daily for roughly 30 minutes at a time.  She has not been doing it in the wintertime due to cold weather.      Social Determinants of Health   Financial Resource Strain: Low Risk  (02/11/2021)   Overall Financial Resource Strain (CARDIA)    Difficulty of Paying Living Expenses: Not hard at all  Food Insecurity: No Food Insecurity (02/11/2021)   Hunger Vital Sign    Worried About Running Out of Food in the Last Year: Never true    Ran Out of Food in the Last Year: Never true  Transportation Needs: No Transportation Needs (02/11/2021)   PRAPARE - Administrator, Civil ServiceTransportation    Lack of Transportation (Medical): No    Lack of Transportation (Non-Medical): No  Physical Activity: Sufficiently Active (02/11/2021)   Exercise Vital Sign    Days of Exercise per Week: 5 days    Minutes of Exercise per Session: 60 min  Stress: No Stress Concern Present (02/11/2021)   [employer] - Occupational Stress  Questionnaire    Feeling of Stress : Only a little  Social Connections: Moderately Isolated (02/11/2021)   Social Connection and Isolation Panel [NHANES]    Frequency of Communication with Friends and Family: More than three times a week    Frequency of Social Gatherings with Friends and Family: More than three times a week    Attends Religious Services: More than 4 times per year    Active Member of [employer]ubs or Organizations: No    Attends BankerClub or Organization Meetings: Never    Marital Status: Divorced  Catering managerntimate Partner Violence: Not At Risk (02/11/2021)   Humiliation, Afraid, Rape, and Kick questionnaire    Fear of Current or Ex-Partner: No    Emotionally Abused: No    Physically Abused: No    Sexually Abused: No    Review of Systems   Objective:   Vitals:   03/13/22 1434  BP: 126/78  Pulse: 76  Resp: 16  Temp: 98.1 F (36.7 C)  TempSrc: Temporal  SpO2: 96%  Weight: 195 lb 9.6 oz (88.7 kg)  Height: 5\' 5"  (1.651 m)     Physical Exam Vitals reviewed.  Constitutional:      Appearance: Normal appearance. She is well-developed.  HENT:     Head: Normocephalic and atraumatic.  Eyes:     Conjunctiva/sclera: Conjunctivae normal.     Pupils: Pupils are equal, round, and reactive to light.  Neck:     Vascular: No carotid bruit.  Cardiovascular:     Rate and Rhythm: Normal rate and regular rhythm.     Heart sounds: Normal heart sounds.  Pulmonary:     Effort: Pulmonary effort is normal.     Breath sounds: Normal breath sounds.  Abdominal:     Palpations: Abdomen is soft. There is no pulsatile mass.  Tenderness: There is no abdominal tenderness.  Musculoskeletal:     Right lower leg: No edema.     Left lower leg: No edema.  Skin:    General: Skin is warm and dry.     Comments: Thickened discolored R great toenail.   Neurological:     Mental Status: She is alert and oriented to person, place, and time.  Psychiatric:        Mood and Affect: Mood normal.         Behavior: Behavior normal.         Assessment & Plan:  Elizabeth Lin is a 76 y.o. female . Lightheadedness Pressure in right side of chest - Plan: Ambulatory referral to Cardiology Hypertension, unspecified type - Plan: hydrochlorothiazide (HYDRODIURIL) 25 MG tablet  -Possibly multifactorial but overall lightheadedness has improved.  Question volume depletion component.  We will try decreasing diuretic to just HCTZ for now.  Monitor for elevated blood pressures or worsening Mnire's symptoms.  We will also refer to cardiology to discuss possible Zio patch and her intermittent right-sided chest pressure, atypical, infrequent.  ER precautions if acute worsening.  Depression, unspecified depression type - Plan: DULoxetine (CYMBALTA) 20 MG capsule  -Improved fatigue with higher dosing as above, would like to try 40 mg dose.  Ordered.  RTC precautions if new side effects.  Onychomycosis of great toe  -Treatment options discussed, deferred meds at this time.  Can follow-up in the next month to discuss further.  Did admit to some intermittent pain in her foot, not currently experiencing.  Can discuss that further at next visit.  RTC precautions if acute worsening.  Meds ordered this encounter  Medications   hydrochlorothiazide (HYDRODIURIL) 25 MG tablet    Sig: Take 1 tablet (25 mg total) by mouth daily.    Dispense:  90 tablet    Refill:  1   DULoxetine (CYMBALTA) 20 MG capsule    Sig: Take 2 capsules (40 mg total) by mouth daily.    Dispense:  30 capsule    Refill:  3   Patient Instructions  Stop triamterene hctz, start just hydrochlorothiazide once per day. If meniere's symptoms worsen, return to prior med.  I will refer you to cardiologist to discuss a Zio patch monitor and chest pressure. .  We can consider a medication for the toenail fungus when you are ready.  Try higher dose of cymbalta at  total per day - make this change in next 1-2 weeks. Return to  if you do not  tolerate that dose  If any return of pain the toes, follow up and we can discuss further.    How to Take Your Blood Pressure Blood pressure is a measurement of how strongly your blood is pressing against the walls of your arteries. Arteries are blood vessels that carry blood from your heart throughout your body. Your health care provider takes your blood pressure at each office visit. You can also take your own blood pressure at home with a blood pressure monitor. You may need to take your own blood pressure to: Confirm a diagnosis of high blood pressure (hypertension). Monitor your blood pressure over time. Make sure your blood pressure medicine is working. Supplies needed: Blood pressure monitor. A chair to sit in. This should be a chair where you can sit upright with your back supported. Do not sit on a soft couch or an armchair. Table or desk. Small notebook and pencil or pen. How to prepare To get  the most accurate reading, avoid the following for 30 minutes before you check your blood pressure: Drinking caffeine. Drinking alcohol. Eating. Smoking. Exercising. Five minutes before you check your blood pressure: Use the bathroom and urinate so that you have an empty bladder. Sit quietly in a chair. Do not talk. How to take your blood pressure To check your blood pressure, follow the instructions in the manual that came with your blood pressure monitor. If you have a digital blood pressure monitor, the instructions may be as follows: Sit up straight in a chair. Place your feet on the floor. Do not cross your ankles or legs. Rest your left arm at the level of your heart on a table or desk or on the arm of a chair. Pull up your shirt sleeve. Wrap the blood pressure cuff around the upper part of your left arm, 1 inch (2.5 cm) above your elbow. It is best to wrap the cuff around bare skin. Fit the cuff snugly, but not too tightly, around your arm. You should be able to place only one  finger between the cuff and your arm. Position the cord so that it rests in the bend of your elbow. Press the power button. Sit quietly while the cuff inflates and deflates. Read the digital reading on the monitor screen and write the numbers down (record them) in a notebook. Wait 2-3 minutes, then repeat the steps, starting at step 1. What does my blood pressure reading mean? A blood pressure reading consists of a higher number over a lower number. Ideally, your blood pressure should be below 120/80. The first ("top") number is called the systolic pressure. It is a measure of the pressure in your arteries as your heart beats. The second ("bottom") number is called the diastolic pressure. It is a measure of the pressure in your arteries as the heart relaxes. Blood pressure is classified into four stages. The following are the stages for adults who do not have a short-term serious illness or a chronic condition. Systolic pressure and diastolic pressure are measured in a unit called mm Hg (millimeters of mercury).  Normal Systolic pressure: below 120. Diastolic pressure: below 80. Elevated Systolic pressure: 120-129. Diastolic pressure: below 80. Hypertension stage 1 Systolic pressure: 130-139. Diastolic pressure: 80-89. Hypertension stage 2 Systolic pressure: 140 or above. Diastolic pressure: 90 or above. You can have elevated blood pressure or hypertension even if only the systolic or only the diastolic number in your reading is higher than normal. Follow these instructions at home: Medicines Take over-the-counter and prescription medicines only as told by your health care provider. Tell your health care provider if you are having any side effects from blood pressure medicine. General instructions Check your blood pressure as often as recommended by your health care provider. Check your blood pressure at the same time every day. Take your monitor to the next appointment with your health  care provider to make sure that: You are using it correctly. It provides accurate readings. Understand what your goal blood pressure numbers are. Keep all follow-up visits. This is important. General tips Your health care provider can suggest a reliable monitor that will meet your needs. There are several types of home blood pressure monitors. Choose a monitor that has an arm cuff. Do not choose a monitor that measures your blood pressure from your wrist or finger. Choose a cuff that wraps snugly, not too tight or too loose, around your upper arm. You should be able to fit only one  finger between your arm and the cuff. You can buy a blood pressure monitor at most drugstores or online. Where to find more information American Heart Association: www.heart.org Contact a health care provider if: Your blood pressure is consistently high. Your blood pressure is suddenly low. Get help right away if: Your systolic blood pressure is higher than 180. Your diastolic blood pressure is higher than 120. These symptoms may be an emergency. Get help right away. Call 911. Do not wait to see if the symptoms will go away. Do not drive yourself to the hospital. Summary Blood pressure is a measurement of how strongly your blood is pressing against the walls of your arteries. A blood pressure reading consists of a higher number over a lower number. Ideally, your blood pressure should be below 120/80. Check your blood pressure at the same time every day. Avoid caffeine, alcohol, smoking, and exercise for 30 minutes prior to checking your blood pressure. These agents can affect the accuracy of the blood pressure reading. This information is not intended to replace advice given to you by your health care provider. Make sure you discuss any questions you have with your health care provider. Document Revised: 05/30/2021 Document Reviewed: 05/30/2021 Elsevier Patient Education  2023 [employer] Inc.     Signed,    Meredith Staggers, MD [facility], [facility] 03/13/22 3:38 PM

## 2022-03-14 DIAGNOSIS — M25612 Stiffness of left shoulder, not elsewhere classified: Secondary | ICD-10-CM | POA: Diagnosis not present

## 2022-03-14 DIAGNOSIS — M6281 Muscle weakness (generalized): Secondary | ICD-10-CM | POA: Diagnosis not present

## 2022-03-14 DIAGNOSIS — R293 Abnormal posture: Secondary | ICD-10-CM | POA: Diagnosis not present

## 2022-03-14 DIAGNOSIS — M25512 Pain in left shoulder: Secondary | ICD-10-CM | POA: Diagnosis not present

## 2022-03-17 DIAGNOSIS — M6281 Muscle weakness (generalized): Secondary | ICD-10-CM | POA: Diagnosis not present

## 2022-03-17 DIAGNOSIS — M25512 Pain in left shoulder: Secondary | ICD-10-CM | POA: Diagnosis not present

## 2022-03-17 DIAGNOSIS — M25612 Stiffness of left shoulder, not elsewhere classified: Secondary | ICD-10-CM | POA: Diagnosis not present

## 2022-03-17 DIAGNOSIS — R293 Abnormal posture: Secondary | ICD-10-CM | POA: Diagnosis not present

## 2022-03-18 DIAGNOSIS — H903 Sensorineural hearing loss, bilateral: Secondary | ICD-10-CM | POA: Diagnosis not present

## 2022-03-20 ENCOUNTER — Ambulatory Visit (INDEPENDENT_AMBULATORY_CARE_PROVIDER_SITE_OTHER): Payer: Medicare PPO

## 2022-03-20 DIAGNOSIS — R293 Abnormal posture: Secondary | ICD-10-CM | POA: Diagnosis not present

## 2022-03-20 DIAGNOSIS — Z0000 Encounter for general adult medical examination without abnormal findings: Secondary | ICD-10-CM

## 2022-03-20 DIAGNOSIS — M6281 Muscle weakness (generalized): Secondary | ICD-10-CM | POA: Diagnosis not present

## 2022-03-20 DIAGNOSIS — M25512 Pain in left shoulder: Secondary | ICD-10-CM | POA: Diagnosis not present

## 2022-03-20 DIAGNOSIS — M25612 Stiffness of left shoulder, not elsewhere classified: Secondary | ICD-10-CM | POA: Diagnosis not present

## 2022-03-20 NOTE — Patient Instructions (Signed)
[name] , Thank you for taking time to come for your Medicare Wellness Visit. I appreciate your ongoing commitment to your health goals. Please review the following plan we discussed and let me know if I can assist you in the future.   Screening recommendations/referrals: Colonoscopy: no longer required  Mammogram: no longer required  Bone Density: 09/26/2014   Recommended yearly ophthalmology/optometry visit for glaucoma screening and checkup Recommended yearly dental visit for hygiene and checkup  Vaccinations: Influenza vaccine: completed  Pneumococcal vaccine: completed  Tdap vaccine: due  Shingles vaccine: will consider     Advanced directives: none   Conditions/risks identified: none   Next appointment: none    Preventive Care 65 Years and Older, Female Preventive care refers to lifestyle choices and visits with your health care provider that can promote health and wellness. What does preventive care include? A yearly physical exam. This is also called an annual well check. Dental exams once or twice a year. Routine eye exams. Ask your health care provider how often you should have your eyes checked. Personal lifestyle choices, including: Daily care of your teeth and gums. Regular physical activity. Eating a healthy diet. Avoiding tobacco and drug use. Limiting alcohol use. Practicing safe sex. Taking low-dose aspirin every day. Taking vitamin and mineral supplements as recommended by your health care provider. What happens during an annual well check? The services and screenings done by your health care provider during your annual well check will depend on your age, overall health, lifestyle risk factors, and family history of disease. Counseling  Your health care provider may ask you questions about your: Alcohol use. Tobacco use. Drug use. Emotional well-being. Home and relationship well-being. Sexual activity. Eating habits. History of falls. Memory  and ability to understand (cognition). Work and work Astronomer. Reproductive health. Screening  You may have the following tests or measurements: Height, weight, and BMI. Blood pressure. Lipid and cholesterol levels. These may be checked every 5 years, or more frequently if you are over 20 years old. Skin check. Lung cancer screening. You may have this screening every year starting at age 54 if you have a 30-pack-year history of smoking and currently smoke or have quit within the past 15 years. Fecal occult blood test (FOBT) of the stool. You may have this test every year starting at age 63. Flexible sigmoidoscopy or colonoscopy. You may have a sigmoidoscopy every 5 years or a colonoscopy every 10 years starting at age 21. Hepatitis C blood test. Hepatitis B blood test. Sexually transmitted disease (STD) testing. Diabetes screening. This is done by checking your blood sugar (glucose) after you have not eaten for a while (fasting). You may have this done every 1-3 years. Bone density scan. This is done to screen for osteoporosis. You may have this done starting at age 70. Mammogram. This may be done every 1-2 years. Talk to your health care provider about how often you should have regular mammograms. Talk with your health care provider about your test results, treatment options, and if necessary, the need for more tests. Vaccines  Your health care provider may recommend certain vaccines, such as: Influenza vaccine. This is recommended every year. Tetanus, diphtheria, and acellular pertussis (Tdap, Td) vaccine. You may need a Td booster every 10 years. Zoster vaccine. You may need this after age 53. Pneumococcal 13-valent conjugate (PCV13) vaccine. One dose is recommended after age 23. Pneumococcal polysaccharide (PPSV23) vaccine. One dose is recommended after age 40. Talk to your health care provider about  which screenings and vaccines you need and how often you need them. This  information is not intended to replace advice given to you by your health care provider. Make sure you discuss any questions you have with your health care provider. Document Released: 10/12/2015 Document Revised: 06/04/2016 Document Reviewed: 07/17/2015 Elsevier Interactive Patient Education  2017 [employer] Prevention in the Home Falls can cause injuries. They can happen to people of all ages. There are many things you can do to make your home safe and to help prevent falls. What can I do on the outside of my home? Regularly fix the edges of walkways and driveways and fix any cracks. Remove anything that might make you trip as you walk through a door, such as a raised step or threshold. Trim any bushes or trees on the path to your home. Use bright outdoor lighting. Clear any walking paths of anything that might make someone trip, such as rocks or tools. Regularly check to see if handrails are loose or broken. Make sure that both sides of any steps have handrails. Any raised decks and porches should have guardrails on the edges. Have any leaves, snow, or ice cleared regularly. Use sand or salt on walking paths during winter. Clean up any spills in your garage right away. This includes oil or grease spills. What can I do in the bathroom? Use night lights. Install grab bars by the toilet and in the tub and shower. Do not use towel bars as grab bars. Use non-skid mats or decals in the tub or shower. If you need to sit down in the shower, use a plastic, non-slip stool. Keep the floor dry. Clean up any water that spills on the floor as soon as it happens. Remove soap buildup in the tub or shower regularly. Attach bath mats securely with double-sided non-slip rug tape. Do not have throw rugs and other things on the floor that can make you trip. What can I do in the bedroom? Use night lights. Make sure that you have a light by your bed that is easy to reach. Do not use any sheets or  blankets that are too big for your bed. They should not hang down onto the floor. Have a firm chair that has side arms. You can use this for support while you get dressed. Do not have throw rugs and other things on the floor that can make you trip. What can I do in the kitchen? Clean up any spills right away. Avoid walking on wet floors. Keep items that you use a lot in easy-to-reach places. If you need to reach something above you, use a strong step stool that has a grab bar. Keep electrical cords out of the way. Do not use floor polish or wax that makes floors slippery. If you must use wax, use non-skid floor wax. Do not have throw rugs and other things on the floor that can make you trip. What can I do with my stairs? Do not leave any items on the stairs. Make sure that there are handrails on both sides of the stairs and use them. Fix handrails that are broken or loose. Make sure that handrails are as long as the stairways. Check any carpeting to make sure that it is firmly attached to the stairs. Fix any carpet that is loose or worn. Avoid having throw rugs at the top or bottom of the stairs. If you do have throw rugs, attach them to the floor with  carpet tape. Make sure that you have a light switch at the top of the stairs and the bottom of the stairs. If you do not have them, ask someone to add them for you. What else can I do to help prevent falls? Wear shoes that: Do not have high heels. Have rubber bottoms. Are comfortable and fit you well. Are closed at the toe. Do not wear sandals. If you use a stepladder: Make sure that it is fully opened. Do not climb a closed stepladder. Make sure that both sides of the stepladder are locked into place. Ask someone to hold it for you, if possible. Clearly mark and make sure that you can see: Any grab bars or handrails. First and last steps. Where the edge of each step is. Use tools that help you move around (mobility aids) if they are  needed. These include: Canes. Walkers. Scooters. Crutches. Turn on the lights when you go into a dark area. Replace any light bulbs as soon as they burn out. Set up your furniture so you have a clear path. Avoid moving your furniture around. If any of your floors are uneven, fix them. If there are any pets around you, be aware of where they are. Review your medicines with your doctor. Some medicines can make you feel dizzy. This can increase your chance of falling. Ask your doctor what other things that you can do to help prevent falls. This information is not intended to replace advice given to you by your health care provider. Make sure you discuss any questions you have with your health care provider. Document Released: 07/12/2009 Document Revised: 02/21/2016 Document Reviewed: 10/20/2014 Elsevier Interactive Patient Education  2017 [employer].

## 2022-03-21 ENCOUNTER — Ambulatory Visit: Payer: Medicare PPO | Admitting: Nurse Practitioner

## 2022-03-21 ENCOUNTER — Encounter: Payer: Self-pay | Admitting: Nurse Practitioner

## 2022-03-21 ENCOUNTER — Other Ambulatory Visit: Payer: Self-pay | Admitting: Family Medicine

## 2022-03-21 VITALS — BP 118/64 | HR 60 | Ht 65.5 in | Wt 195.6 lb

## 2022-03-21 DIAGNOSIS — I350 Nonrheumatic aortic (valve) stenosis: Secondary | ICD-10-CM | POA: Diagnosis not present

## 2022-03-21 DIAGNOSIS — R42 Dizziness and giddiness: Secondary | ICD-10-CM | POA: Diagnosis not present

## 2022-03-21 DIAGNOSIS — E782 Mixed hyperlipidemia: Secondary | ICD-10-CM | POA: Diagnosis not present

## 2022-03-21 DIAGNOSIS — I493 Ventricular premature depolarization: Secondary | ICD-10-CM

## 2022-03-21 DIAGNOSIS — I10 Essential (primary) hypertension: Secondary | ICD-10-CM | POA: Diagnosis not present

## 2022-03-21 DIAGNOSIS — R0789 Other chest pain: Secondary | ICD-10-CM

## 2022-03-21 DIAGNOSIS — I4510 Unspecified right bundle-branch block: Secondary | ICD-10-CM | POA: Diagnosis not present

## 2022-03-21 DIAGNOSIS — F32A Depression, unspecified: Secondary | ICD-10-CM

## 2022-03-21 NOTE — Progress Notes (Signed)
Office Visit    Patient Name: Elizabeth Lin Date of Encounter: 03/21/2022  Primary Care Provider:  Shade Flood, MD Primary Cardiologist:  Bryan Lemma, MD  Chief Complaint    76 year old female with a history of mild aortic stenosis, RBBB, hypertension, hyperlipidemia, obesity, and family history of premature CAD who presents for follow-up related to aortic stenosis and hypertension.  Past Medical History    Past Medical History:  Diagnosis Date   Allergy    Anxiety    Arthritis    Carpal tunnel syndrome    right hand, getting injections   Cataract    Depression    Dysrhythmia    BBB   Hammer toe    Hypothyroidism due to Hashimoto's thyroiditis    Meniere disease, right    Past Surgical History:  Procedure Laterality Date   APPENDECTOMY     COLONOSCOPY     CYSTECTOMY     eye   LAPAROSCOPIC ASSISTED SPIGELIAN HERNIA REPAIR N/A 11/08/2019   Procedure: LAPAROSCOPIC ASSISTED SPIGELIAN HERNIA REPAIR WITH MESH;  Surgeon: Manus Rudd, MD;  Location: [facility];  Service: General;  Laterality: N/A;   OOPHORECTOMY Right    TONSILLECTOMY     TRANSTHORACIC ECHOCARDIOGRAM  12/09/2019   EF 60 to 65%.  No R WMA.  GR 1 DD.  Normal RV function.  Aortic calcification with sclerosis but no suggestion of stenosis.  (Mean gradient 8 mmHg)    Allergies  Allergies  Allergen Reactions   Penicillins Rash    Did it involve swelling of the face/tongue/throat, SOB, or low BP? No Did it involve sudden or severe rash/hives, skin peeling, or any reaction on the inside of your mouth or nose? Yes Did you need to seek medical attention at a hospital or doctor's office? No When did it last happen?  ~6-10 years ago     If all above answers are "NO", may proceed with cephalosporin use.    Amoxicillin-Pot Clavulanate     Red rash Did it involve swelling of the face/tongue/throat, SOB, or low BP? No Did it involve sudden or severe rash/hives, skin peeling, or any reaction on the  inside of your mouth or nose? Yes Did you need to seek medical attention at a hospital or doctor's office? No When did it last happen? ~6-10 years ago    If all above answers are "NO", may proceed with cephalosporin use.    Sulfa Antibiotics     History of Present Illness    76 year old female with the above past medical history including mild aortic stenosis, RBBB, hypertension, hyperlipidemia, obesity, and family history of premature CAD.  She has a family history of CAD.  Coronary calcium score in 2019 revealed calcium score of 0.  Echocardiogram in 2019 showed mild aortic valve stenosis.  Repeat echocardiogram in 2021 showed EF 60 to 65%, mild LV function, no RWMA, G1 DD, mild aortic valve sclerosis with no evidence of stenosis.  Repeat echocardiogram was recommended in 3 years.  She was last seen in the office on 01/20/2022 by Dr. Herbie Baltimore and was stable from a cardiac standpoint.  BP was well controlled.  She denied symptoms concerning for angina.  Repeat coronary calcium score for risk stratification in May 2023 showed calcium score of 0, no evidence of CAD.  She saw her PCP in May 2023 with complaints of dizziness, right-sided chest pressure.  At her follow-up with her PCP in June 2023 her dizziness had improved, there was question of  volume depletion component.  Diuretic therapy was decreased (Maxide switched to HCTZ). Additionally, she has a history of Mnire's disease. It was recommended she follow-up with cardiology to discuss possible Zio patch.  She presents today for follow-up.  Since her last visit she has done well from a cardiac standpoint. Since decreasing her blood pressure medication, her symptoms of dizziness and chest pain have completely resolved.  She denies any palpitations, presyncope, syncope.  Denies any exertional symptoms concerning for angina.  Overall, she reports feeling well and denies any new concerns today.  Home Medications    Current Outpatient Medications   Medication Sig Dispense Refill   atorvastatin (LIPITOR) 10 MG tablet Take 1 tablet (10 mg total) by mouth 2 (two) times a week. 90 tablet 1   Calcium-Vitamin D-Vitamin K (CALCIUM + D + K PO) Take 1 tablet by mouth 2 (two) times daily. 1300 mg+D1000 IU     DULoxetine (CYMBALTA) 20 MG capsule TAKE 2 CAPSULES BY MOUTH EVERY DAY 180 capsule 1   fish oil-omega-3 fatty acids 1000 MG capsule Take 1 g by mouth 2 (two) times daily.      Gluc-Chonn-MSM-Boswellia-Vit D (GLUCOSAMINE CHOND TRIPLE/VIT D) TABS Take 1 tablet by mouth 2 (two) times daily.     hydrochlorothiazide (HYDRODIURIL) 25 MG tablet Take 1 tablet (25 mg total) by mouth daily. 90 tablet 1   levothyroxine (SYNTHROID) 150 MCG tablet Take 1 tablet (150 mcg total) by mouth daily. 90 tablet 3   naproxen sodium (ALEVE) 220 MG tablet Take 220-440 mg by mouth 2 (two) times daily as needed (pain/soreness).     Polyethyl Glycol-Propyl Glycol (LUBRICANT EYE DROPS) 0.4-0.3 % SOLN Place 1 drop into both eyes 3 (three) times daily as needed (dry/irritated eyes.). Rohto Ice Multi-Symptom Relief     No current facility-administered medications for this visit.     Review of Systems   She denies chest pain, palpitations, dyspnea, pnd, orthopnea, n, v, dizziness, syncope, edema, weight gain, or early satiety. All other systems reviewed and are otherwise negative except as noted above.   Physical Exam    VS:  BP 118/64   Pulse 60   Ht 5' 5.5" (1.664 m)   Wt 195 lb 9.6 oz (88.7 kg)   SpO2 97%   BMI 32.05 kg/m   GEN: Well nourished, well developed, in no acute distress. HEENT: normal. Neck: Supple, no JVD, carotid bruits, or masses. Cardiac: RRR, no murmurs, rubs, or gallops. No clubbing, cyanosis, edema.  Radials/DP/PT 2+ and equal bilaterally.  Respiratory:  Respirations regular and unlabored, clear to auscultation bilaterally. GI: Soft, nontender, nondistended, BS + x 4. MS: no deformity or atrophy. Skin: warm and dry, no rash. Neuro:   Strength and sensation are intact. Psych: Normal affect.  Accessory Clinical Findings    ECG personally reviewed by me today -sinus rhythm, 60 bpm, occasional PVC, RBBB, LAFB- no acute changes.  Lab Results  Component Value Date   WBC 7.8 02/26/2022   HGB 14.3 02/26/2022   HCT 43.2 02/26/2022   MCV 86.9 02/26/2022   PLT 267.0 02/26/2022   Lab Results  Component Value Date   CREATININE 0.90 02/26/2022   BUN 28 (H) 02/26/2022   NA 136 02/26/2022   K 4.1 02/26/2022   CL 97 02/26/2022   CO2 29 02/26/2022   Lab Results  Component Value Date   ALT 25 02/26/2022   AST 25 02/26/2022   ALKPHOS 65 02/26/2022   BILITOT 0.8 02/26/2022   Lab Results  Component Value Date   CHOL 227 (H) 07/22/2021   HDL 71.60 07/22/2021   LDLCALC 126 (H) 07/22/2021   TRIG 146.0 07/22/2021   CHOLHDL 3 07/22/2021    Lab Results  Component Value Date   HGBA1C 5.6 10/21/2017    Assessment & Plan    1. Dizziness/atypical chest pain: Repeat coronary calcium score for risk stratification in May 2023 showed calcium score of 0, no evidence of CAD.  At her follow-up with her PCP in June 2023 her dizziness had improved, there was question of volume depletion component.  Maxide was switched to HCTZ alone. Since then, she has not had any recurrence of symptoms.  She states she has lost some weight recently, likely this has contributed to overall lower BP. She denies palpitations, dizziness, presyncope, syncope, dyspnea, or any recurrent chest discomfort.  Overall she reports feeling well.  I do not think cardiac monitor or repeat echocardiogram are indicated at this time. I advised her to continue to monitor her symptoms and should they recur, we could consider Zio patch and/or repeat echocardiogram at that time. Patient is agreeable to plan.   2. Mild aortic stenosis:  Echo in 2019 showed mild aortic valve stenosis.  Most recent echo in March 2021 showed EF 60 to 65%, mild LV function, no RWMA, G1 DD, mild  aortic valve sclerosis with no evidence of stenosis.  Euvolemic and well compensated on exam.  She did have some recent dizziness, however, this has since resolved with titration of BP medication as above.  Repeat echo was recommended in 3 years to be scheduled for April 2024.   3. RBBB/PVCs: She denies any recent palpitations.  EKG today shows sinus rhythm with occasional PVCs, 60 bpm, RBBB, LAFB.  Stable.  4. Hypertension: BP well controlled. Continue current antihypertensive regimen.   5. Hyperlipidemia: LDL was 126 in 06/2021. Monitored and managed per PCP.  Continue Lipitor.  6. Disposition: Follow-up in April 2024 with Dr. Herbie Baltimore, sooner if needed.  Joylene Grapes, NP 03/21/2022, 5:47 PM

## 2022-03-25 DIAGNOSIS — M25612 Stiffness of left shoulder, not elsewhere classified: Secondary | ICD-10-CM | POA: Diagnosis not present

## 2022-03-25 DIAGNOSIS — M6281 Muscle weakness (generalized): Secondary | ICD-10-CM | POA: Diagnosis not present

## 2022-03-25 DIAGNOSIS — R293 Abnormal posture: Secondary | ICD-10-CM | POA: Diagnosis not present

## 2022-03-25 DIAGNOSIS — M25512 Pain in left shoulder: Secondary | ICD-10-CM | POA: Diagnosis not present

## 2022-03-31 ENCOUNTER — Telehealth: Payer: Self-pay | Admitting: Family Medicine

## 2022-03-31 DIAGNOSIS — E785 Hyperlipidemia, unspecified: Secondary | ICD-10-CM

## 2022-03-31 NOTE — Telephone Encounter (Signed)
Directions say Take 1 tablet 2 times a week? Please advise, I am unsure the directions to adjust to

## 2022-03-31 NOTE — Telephone Encounter (Signed)
Pt called stating that she needs a refill of atorvastatin 10 mg.  States that dosage was changed from BID to TID and she is running out. Please send to [facility] [address] [phone]

## 2022-04-01 MED ORDER — ATORVASTATIN CALCIUM 10 MG PO TABS
10.0000 mg | ORAL_TABLET | ORAL | 1 refills | Status: DC
Start: 2022-04-03 — End: 2022-04-17

## 2022-04-02 DIAGNOSIS — R293 Abnormal posture: Secondary | ICD-10-CM | POA: Diagnosis not present

## 2022-04-02 DIAGNOSIS — M25612 Stiffness of left shoulder, not elsewhere classified: Secondary | ICD-10-CM | POA: Diagnosis not present

## 2022-04-02 DIAGNOSIS — M25512 Pain in left shoulder: Secondary | ICD-10-CM | POA: Diagnosis not present

## 2022-04-02 DIAGNOSIS — M6281 Muscle weakness (generalized): Secondary | ICD-10-CM | POA: Diagnosis not present

## 2022-04-08 DIAGNOSIS — M6281 Muscle weakness (generalized): Secondary | ICD-10-CM | POA: Diagnosis not present

## 2022-04-08 DIAGNOSIS — M25612 Stiffness of left shoulder, not elsewhere classified: Secondary | ICD-10-CM | POA: Diagnosis not present

## 2022-04-08 DIAGNOSIS — M25512 Pain in left shoulder: Secondary | ICD-10-CM | POA: Diagnosis not present

## 2022-04-08 DIAGNOSIS — R293 Abnormal posture: Secondary | ICD-10-CM | POA: Diagnosis not present

## 2022-04-16 NOTE — Telephone Encounter (Signed)
Pt is here at the office. Pt trying to get a update about her medication. Pt has been out of medication since last Friday. Pt want to know if Dr. Neva Seat can send in the new prescription for three time a week for atorvastatin 10 mg. [facility] in [place].

## 2022-04-17 ENCOUNTER — Other Ambulatory Visit: Payer: Self-pay | Admitting: Family Medicine

## 2022-04-17 ENCOUNTER — Ambulatory Visit: Payer: Medicare PPO | Admitting: Family Medicine

## 2022-04-17 ENCOUNTER — Encounter: Payer: Self-pay | Admitting: Family Medicine

## 2022-04-17 ENCOUNTER — Other Ambulatory Visit: Payer: Self-pay

## 2022-04-17 VITALS — BP 122/68 | HR 62 | Temp 98.0°F | Resp 18 | Ht 65.5 in | Wt 194.0 lb

## 2022-04-17 DIAGNOSIS — L299 Pruritus, unspecified: Secondary | ICD-10-CM | POA: Diagnosis not present

## 2022-04-17 DIAGNOSIS — R42 Dizziness and giddiness: Secondary | ICD-10-CM

## 2022-04-17 DIAGNOSIS — E785 Hyperlipidemia, unspecified: Secondary | ICD-10-CM

## 2022-04-17 DIAGNOSIS — M79601 Pain in right arm: Secondary | ICD-10-CM | POA: Diagnosis not present

## 2022-04-17 MED ORDER — KETOCONAZOLE 1 % EX SHAM: 1.0000 | MEDICATED_SHAMPOO | CUTANEOUS | 0 refills | Status: DC

## 2022-04-17 MED ORDER — ATORVASTATIN CALCIUM 10 MG PO TABS: 10.0000 mg | ORAL_TABLET | ORAL | 1 refills | Status: DC

## 2022-04-17 NOTE — Progress Notes (Signed)
Subjective:  Patient ID: Elizabeth Lin, female    DOB: March 02, 1946  Age: 76 y.o. MRN: [id number]  CC:  Chief Complaint  Patient presents with   Medication Refill    Patient states she is returning for medication refill and toe issues.    HPI [name] presents for   Hyperlipidemia: Question yesterday regarding her medications and doses.  Had been on twice per week dosing, based on labs in October recommended 3 times per week dosing.  Lipitor 10 mg 3 times per week. Not fasting today. No new side effects or myalgias. Past few weeks cramping sensation in R hand up to forearm. Notices with lying down at night int ot the morning at times. None during the day.  R hand dominant.  No new activities or injury. Was doing some exercises/PT for shoulder. Just finished.  Lab Results  Component Value Date   CHOL 227 (H) 07/22/2021   HDL 71.60 07/22/2021   LDLCALC 126 (H) 07/22/2021   TRIG 146.0 07/22/2021   CHOLHDL 3 07/22/2021   Lab Results  Component Value Date   ALT 25 02/26/2022   AST 25 02/26/2022   ALKPHOS 65 02/26/2022   BILITOT 0.8 02/26/2022   Dizziness/lightheadedness Most recently discussed June 15.  Improving at that time.  Good note an occasional pressure on the right side of her chest but no palpitations.  No pain.  Nausea few times per week.  Question of volume depletion component, due to increased diuretic to just HCTZ, prior triamterene hct.  Follow-up with cardiology June 23.  Symptoms had improved on just HCTZ.  Deferred cardiac monitoring or repeat echo.  If symptoms recurred, Zio patch or repeat echo planned.  Repeat echo in 2024 for aortic stenosis, mild.  No recurrence of dizziness. Home BP 124-127/72.   Dry scalp at times past year.  Applied alcohol to it which helps some. Neutral shampoo once per week, conditioner daily. Itches at times, no flaking.   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   Meniere  disease, left 12/12/2017   Mild aortic stenosis 11/10/2017   Bilateral edema of lower extremity 11/10/2017   Family history of premature CAD 11/10/2017   Arthralgia of multiple joints 10/23/2017   Class 1 obesity due to excess calories without serious comorbidity with body mass index (BMI) of 32.0 to 32.9 in adult 10/23/2017   Essential hypertension 10/23/2017   Right bundle branch block (RBBB) 10/23/2017   Pure hypercholesterolemia 10/23/2017   Vitamin D deficiency 09/06/2014   Right knee pain 02/24/2013   Hypothyroid 11/02/2011   Depression 11/02/2011   History of positive PPD 11/02/2011   Past Medical History:  Diagnosis Date   Allergy    Anxiety    Arthritis    Carpal tunnel syndrome    right hand, getting injections   Cataract    Depression    Dysrhythmia    BBB   Hammer toe    Hypothyroidism due to Hashimoto's thyroiditis    Meniere disease, right    Past Surgical History:  Procedure Laterality Date   APPENDECTOMY     COLONOSCOPY     CYSTECTOMY     eye   LAPAROSCOPIC ASSISTED SPIGELIAN HERNIA REPAIR N/A 11/08/2019   Procedure: LAPAROSCOPIC ASSISTED SPIGELIAN HERNIA REPAIR WITH MESH;  Surgeon: Manus Rudd, MD;  Location: [facility];  Service: General;  Laterality: N/A;   OOPHORECTOMY Right    TONSILLECTOMY     TRANSTHORACIC  ECHOCARDIOGRAM  12/09/2019   EF 60 to 65%.  No R WMA.  GR 1 DD.  Normal RV function.  Aortic calcification with sclerosis but no suggestion of stenosis.  (Mean gradient 8 mmHg)   Allergies  Allergen Reactions   Penicillins Rash    Did it involve swelling of the face/tongue/throat, SOB, or low BP? No Did it involve sudden or severe rash/hives, skin peeling, or any reaction on the inside of your mouth or nose? Yes Did you need to seek medical attention at a hospital or doctor's office? No When did it last happen?  ~6-10 years ago     If all above answers are "NO", may proceed with cephalosporin use.    Amoxicillin-Pot Clavulanate     Red  rash Did it involve swelling of the face/tongue/throat, SOB, or low BP? No Did it involve sudden or severe rash/hives, skin peeling, or any reaction on the inside of your mouth or nose? Yes Did you need to seek medical attention at a hospital or doctor's office? No When did it last happen? ~6-10 years ago    If all above answers are "NO", may proceed with cephalosporin use.    Sulfa Antibiotics    Prior to Admission medications   Medication Sig Start Date End Date Taking? Authorizing Provider  atorvastatin (LIPITOR) 10 MG tablet Take 1 tablet (10 mg total) by mouth 2 (two) times a week. 04/03/22  Yes Shade Flood, MD  Calcium-Vitamin D-Vitamin K (CALCIUM + D + K PO) Take 1 tablet by mouth 2 (two) times daily. 1300 mg+D1000 IU   Yes [provider]  DULoxetine (CYMBALTA) 20 MG capsule TAKE 2 CAPSULES BY MOUTH EVERY DAY 03/21/22  Yes Shade Flood, MD  fish oil-omega-3 fatty acids 1000 MG capsule Take 1 g by mouth 2 (two) times daily.    Yes [provider]  Gluc-Chonn-MSM-Boswellia-Vit D (GLUCOSAMINE CHOND TRIPLE/VIT D) TABS Take 1 tablet by mouth 2 (two) times daily.   Yes [provider]  hydrochlorothiazide (HYDRODIURIL) 25 MG tablet Take 1 tablet (25 mg total) by mouth daily. 03/13/22  Yes Shade Flood, MD  levothyroxine (SYNTHROID) 150 MCG tablet Take 1 tablet (150 mcg total) by mouth daily. 07/22/21  Yes Shade Flood, MD  naproxen sodium (ALEVE) 220 MG tablet Take 220-440 mg by mouth 2 (two) times daily as needed (pain/soreness).   Yes [provider]  Polyethyl Glycol-Propyl Glycol (LUBRICANT EYE DROPS) 0.4-0.3 % SOLN Place 1 drop into both eyes 3 (three) times daily as needed (dry/irritated eyes.). 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  3 children, 3 grandchildren.   Patient's  Education: [employer].  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   Patient is right handed   Caffeine consumption 2-3 daily    Walks daily for roughly 30 minutes at a time.  She has not been doing it in the wintertime due to cold weather.      Social Determinants of Health   Financial Resource Strain: Low Risk  (03/20/2022)   Overall Financial Resource Strain (CARDIA)    Difficulty of Paying Living Expenses: Not hard at all  Food Insecurity: No Food Insecurity (03/20/2022)   Hunger Vital Sign    Worried About Running Out of Food in the Last Year: Never true    Ran Out of Food in the Last Year: Never true  Transportation Needs: No Transportation Needs (03/20/2022)   PRAPARE - Administrator, Civil ServiceTransportation    Lack of Transportation (Medical): No    Lack of Transportation (Non-Medical): No  Physical Activity: Insufficiently Active (03/20/2022)   Exercise Vital Sign    Days of Exercise per Week: 3 days    Minutes of Exercise per Session: 30 min  Stress: No Stress Concern Present (03/20/2022)   [employer] - Occupational Stress Questionnaire    Feeling of Stress : Not at all  Social Connections: Moderately Isolated (03/20/2022)   Social Connection and Isolation Panel [NHANES]    Frequency  of Communication with Friends and Family: Twice a week    Frequency of Social Gatherings with Friends and Family: Twice a week    Attends Religious Services: Never    Database administratorActive Member of Clubs or Organizations: Yes    Attends BankerClub or Organization Meetings: 1 to 4 times per year    Marital Status: Divorced  Intimate Partner Violence: Not At Risk (03/20/2022)   Humiliation, Afraid, Rape, and Kick questionnaire    Fear of Current or Ex-Partner: No    Emotionally Abused: No    Physically Abused: No    Sexually Abused: No    Review of Systems Per HPI  Objective:   Vitals:   04/17/22 0820  BP: 122/68  Pulse: 62  Resp: 18  Temp: 98 F (36.7 C)  TempSrc: Temporal  SpO2: 98%  Weight: 194 lb (88 kg)  Height: 5' 5.5" (1.664 m)     Physical Exam Vitals reviewed.  Constitutional:      Appearance: Normal appearance. She is well-developed.  HENT:     Head: Normocephalic and atraumatic.  Eyes:     Conjunctiva/sclera: Conjunctivae normal.     Pupils: Pupils are equal, round, and reactive to light.  Neck:     Vascular: No carotid bruit.  Cardiovascular:     Rate and Rhythm: Normal rate and regular rhythm.     Heart sounds: Normal heart sounds.  Pulmonary:     Effort: Pulmonary effort is normal.     Breath sounds: Normal breath sounds.  Abdominal:     Palpations: Abdomen is soft. There is no pulsatile mass.     Tenderness: There is no abdominal tenderness.  Musculoskeletal:     Right lower leg: No edema.     Left lower leg: No edema.     Comments: Minimal discomfort over the proximal dorsal right forearm, extensor musculature, full range of motion of wrist, elbow, no bony tenderness.  Wrist nontender.  Hand nontender.  Skin:    General: Skin is warm and dry.  Comments: No apparent rash of scalp, no crusting, skin intact.  Neurological:     Mental Status: She is alert and oriented to person, place, and time.  Psychiatric:        Mood and Affect: Mood normal.         Behavior: Behavior normal.        Assessment & Plan:  ANIKA SHORE is a 76 y.o. female . Right arm pain  -New concern.  Possible overuse tendinitis.  Unlikely statin related.  Avoid downward or grasp caring, activity modification right forearm next few weeks.  RTC precautions if persistent.  Hyperlipidemia, unspecified hyperlipidemia type - Plan: atorvastatin (LIPITOR) 10 MG tablet, Lipid panel  -Check updated lipids, recent CMP in May.  Continue 3 times per week dosing.  Lightheadedness  -Resolved, likely related to blood pressure, stable now.  No changes.  Itchy scalp - Plan: KETOCONAZOLE, TOPICAL, 1 % SHAM New concern.  Overall reassuring exam, possible component of seborrhea.  Avoid use of alcohol to area as may cause over drying.  Trial of ketoconazole shampoo with RTC precautions if persistent.  Meds ordered this encounter  Medications   atorvastatin (LIPITOR) 10 MG tablet    Sig: Take 1 tablet (10 mg total) by mouth 3 (three) times a week.    Dispense:  45 tablet    Refill:  1   KETOCONAZOLE, TOPICAL, 1 % SHAM    Sig: Apply 1 Application topically once a week.    Dispense:  125 mL    Refill:  0   Patient Instructions  Lab visit tomorrow morning.  Avoid carrying objects with palm down and avoid repetitive activity with the right arm for the next week or 2 to see if the arm soreness will resolve on its own.  If that pain persistS, please follow-up and we can discuss further. I do not see a rash on the scalp at this time but based on symptoms you may have a condition called seborrheic dermatitis.  See information below.  Try the ketoconazole shampoo once per week for now and avoid alcohol to scalp. Follow up if not improved.  No other med changes.  Glad the dizziness is better.  Take care!  Return to the clinic or go to the nearest emergency room if any of your symptoms worsen or new symptoms occur.   Seborrheic Dermatitis, Adult Seborrheic dermatitis is a skin  disease that causes red, scaly patches. It usually occurs on the scalp, and it is often called dandruff. The patches may appear on other parts of the body. Skin patches tend to appear where there are many oil glands in the skin. Areas of the body that are commonly affected include the: Scalp. Ears. Eyebrows. Face. Bearded area of [employer]. Skin folds of the body, such as the armpits, groin, and buttocks. Chest. The condition may come and go for no known reason, and it is often long-lasting (chronic). What are the causes? The cause of this condition is not known. What increases the risk? The following factors may make you more likely to develop this condition: Having certain conditions, such as: HIV (human immunodeficiency virus). AIDS (acquired immunodeficiency syndrome). Parkinson's disease. Mood disorders, such as depression. Being 4-57 years old. What are the signs or symptoms? Symptoms of this condition include: Thick scales on the scalp. Redness on the face or in the armpits. Skin that is flaky. The flakes may be white or yellow. Skin that seems oily or dry but is not helped  with moisturizers. Itching or burning in the affected areas. How is this diagnosed? This condition is diagnosed with a medical history and physical exam. A sample of your skin may be tested (skin biopsy). You may need to see a skin specialist (dermatologist). How is this treated? There is no cure for this condition, but treatment can help to manage the symptoms. You may get treatment to remove scales, lower the risk of skin infection, and reduce swelling or itching. Treatment may include: Creams that reduce skin yeast. Medicated shampoo. Moisturizing creams or ointments. Creams that reduce swelling and irritation (steroids). Follow these instructions at home: Apply over-the-counter and prescription medicines only as told by your health care provider. Use any medicated shampoo, skin creams, or  ointments only as told by your health care provider. Keep all follow-up visits as told by your health care provider. This is important. Contact a health care provider if: Your symptoms do not improve with treatment. Your symptoms get worse. You have new symptoms. Get help right away if: Your condition rapidly worsens with treatment. Summary Seborrheic dermatitis is a skin disease that causes red, scaly patches. Seborrheic dermatitis commonly affects the scalp, face, and skin folds. There is no cure for this condition, but treatment can help to manage the symptoms. This information is not intended to replace advice given to you by your health care provider. Make sure you discuss any questions you have with your health care provider. Document Revised: 06/23/2019 Document Reviewed: 06/23/2019 Elsevier Patient Education  2023 Elsevier Inc.      Signed,   Meredith Staggers, MD [facility], [facility] 04/17/22 9:02 AM

## 2022-04-17 NOTE — Patient Instructions (Addendum)
Lab visit tomorrow morning.  Avoid carrying objects with palm down and avoid repetitive activity with the right arm for the next week or 2 to see if the arm soreness will resolve on its own.  If that pain persistS, please follow-up and we can discuss further. I do not see a rash on the scalp at this time but based on symptoms you may have a condition called seborrheic dermatitis.  See information below.  Try the ketoconazole shampoo once per week for now and avoid alcohol to scalp. Follow up if not improved.  No other med changes.  Glad the dizziness is better.  Take care!  Return to the clinic or go to the nearest emergency room if any of your symptoms worsen or new symptoms occur.   Seborrheic Dermatitis, Adult Seborrheic dermatitis is a skin disease that causes red, scaly patches. It usually occurs on the scalp, and it is often called dandruff. The patches may appear on other parts of the body. Skin patches tend to appear where there are many oil glands in the skin. Areas of the body that are commonly affected include the: Scalp. Ears. Eyebrows. Face. Bearded area of [employer]. Skin folds of the body, such as the armpits, groin, and buttocks. Chest. The condition may come and go for no known reason, and it is often long-lasting (chronic). What are the causes? The cause of this condition is not known. What increases the risk? The following factors may make you more likely to develop this condition: Having certain conditions, such as: HIV (human immunodeficiency virus). AIDS (acquired immunodeficiency syndrome). Parkinson's disease. Mood disorders, such as depression. Being 59-43 years old. What are the signs or symptoms? Symptoms of this condition include: Thick scales on the scalp. Redness on the face or in the armpits. Skin that is flaky. The flakes may be white or yellow. Skin that seems oily or dry but is not helped with moisturizers. Itching or burning in the affected  areas. How is this diagnosed? This condition is diagnosed with a medical history and physical exam. A sample of your skin may be tested (skin biopsy). You may need to see a skin specialist (dermatologist). How is this treated? There is no cure for this condition, but treatment can help to manage the symptoms. You may get treatment to remove scales, lower the risk of skin infection, and reduce swelling or itching. Treatment may include: Creams that reduce skin yeast. Medicated shampoo. Moisturizing creams or ointments. Creams that reduce swelling and irritation (steroids). Follow these instructions at home: Apply over-the-counter and prescription medicines only as told by your health care provider. Use any medicated shampoo, skin creams, or ointments only as told by your health care provider. Keep all follow-up visits as told by your health care provider. This is important. Contact a health care provider if: Your symptoms do not improve with treatment. Your symptoms get worse. You have new symptoms. Get help right away if: Your condition rapidly worsens with treatment. Summary Seborrheic dermatitis is a skin disease that causes red, scaly patches. Seborrheic dermatitis commonly affects the scalp, face, and skin folds. There is no cure for this condition, but treatment can help to manage the symptoms. This information is not intended to replace advice given to you by your health care provider. Make sure you discuss any questions you have with your health care provider. Document Revised: 06/23/2019 Document Reviewed: 06/23/2019 Elsevier Patient Education  2023 [employer].

## 2022-04-18 ENCOUNTER — Other Ambulatory Visit: Payer: Medicare PPO

## 2022-04-21 ENCOUNTER — Other Ambulatory Visit (INDEPENDENT_AMBULATORY_CARE_PROVIDER_SITE_OTHER): Payer: Medicare PPO

## 2022-04-21 DIAGNOSIS — E785 Hyperlipidemia, unspecified: Secondary | ICD-10-CM | POA: Diagnosis not present

## 2022-04-21 LAB — LIPID PANEL
Cholesterol: 217 mg/dL — ABNORMAL HIGH (ref 0–200)
HDL: 76.4 mg/dL (ref >39.00)
LDL Cholesterol: 126 mg/dL — ABNORMAL HIGH (ref 0–99)
NonHDL: 140.69
Total CHOL/HDL Ratio: 3
Triglycerides: 75 mg/dL (ref 0.0–149.0)
VLDL: 15 mg/dL (ref 0.0–40.0)

## 2022-05-05 DIAGNOSIS — H52221 Regular astigmatism, right eye: Secondary | ICD-10-CM | POA: Diagnosis not present

## 2022-05-05 DIAGNOSIS — E785 Hyperlipidemia, unspecified: Secondary | ICD-10-CM | POA: Diagnosis not present

## 2022-05-05 DIAGNOSIS — E039 Hypothyroidism, unspecified: Secondary | ICD-10-CM | POA: Diagnosis not present

## 2022-05-05 DIAGNOSIS — I4510 Unspecified right bundle-branch block: Secondary | ICD-10-CM | POA: Diagnosis not present

## 2022-05-05 DIAGNOSIS — F418 Other specified anxiety disorders: Secondary | ICD-10-CM | POA: Diagnosis not present

## 2022-05-05 DIAGNOSIS — H2511 Age-related nuclear cataract, right eye: Secondary | ICD-10-CM | POA: Diagnosis not present

## 2022-05-05 DIAGNOSIS — K219 Gastro-esophageal reflux disease without esophagitis: Secondary | ICD-10-CM | POA: Diagnosis not present

## 2022-05-05 DIAGNOSIS — I10 Essential (primary) hypertension: Secondary | ICD-10-CM | POA: Diagnosis not present

## 2022-05-05 DIAGNOSIS — H524 Presbyopia: Secondary | ICD-10-CM | POA: Diagnosis not present

## 2022-05-21 DIAGNOSIS — H524 Presbyopia: Secondary | ICD-10-CM | POA: Diagnosis not present

## 2022-05-21 DIAGNOSIS — H52222 Regular astigmatism, left eye: Secondary | ICD-10-CM | POA: Diagnosis not present

## 2022-05-21 DIAGNOSIS — E039 Hypothyroidism, unspecified: Secondary | ICD-10-CM | POA: Diagnosis not present

## 2022-05-21 DIAGNOSIS — H2512 Age-related nuclear cataract, left eye: Secondary | ICD-10-CM | POA: Diagnosis not present

## 2022-05-21 DIAGNOSIS — I10 Essential (primary) hypertension: Secondary | ICD-10-CM | POA: Diagnosis not present

## 2022-06-19 DIAGNOSIS — F333 Major depressive disorder, recurrent, severe with psychotic symptoms: Secondary | ICD-10-CM | POA: Diagnosis not present

## 2022-06-20 ENCOUNTER — Encounter: Payer: Self-pay | Admitting: Family Medicine

## 2022-06-20 ENCOUNTER — Ambulatory Visit: Payer: Medicare PPO | Admitting: Family Medicine

## 2022-06-20 VITALS — BP 118/62 | HR 60 | Temp 97.4°F | Resp 17 | Ht 65.5 in | Wt 198.5 lb

## 2022-06-20 DIAGNOSIS — R0981 Nasal congestion: Secondary | ICD-10-CM

## 2022-06-20 DIAGNOSIS — J069 Acute upper respiratory infection, unspecified: Secondary | ICD-10-CM

## 2022-06-20 LAB — POC COVID19 BINAXNOW: SARS Coronavirus 2 Ag: NEGATIVE

## 2022-06-20 LAB — POCT INFLUENZA A/B
Influenza A, POC: NEGATIVE
Influenza B, POC: NEGATIVE

## 2022-06-20 MED ORDER — GUAIFENESIN-CODEINE 100-10 MG/5ML PO SYRP: 10.0000 mL | ORAL_SOLUTION | Freq: Three times a day (TID) | ORAL | 0 refills | Status: DC | PRN

## 2022-06-20 NOTE — Patient Instructions (Addendum)
Your COVID and flu tests are negative! This still seems like a viral illness and should improve w/ time Drink LOTS of fluids and get PLENTY of rest Take Vit D, Vit C, and Zinc to help boost your immune system Use the cough syrup as needed- may cause drowsiness ADD daily Claritin or Zyrtec to help w/ the congestion and drainage Call with any questions or concerns- particularly if symptoms change or worsen Hang in there!!

## 2022-06-20 NOTE — Progress Notes (Signed)
   Subjective:    Patient ID: Elizabeth Lin, female    DOB: 09/13/1946, 76 y.o.   MRN: [id number]  HPI URI- sxs started yesterday morning.  Started w/ congestion, PND, sore throat.  + body aches.  No HA.  Taking Tylenol/ibuprofen/benadryl.  + subjective fever.  + sinus pressure- 'my head is full'.  + cough- dry.  No N/V/D.  + sick contacts.  Denies SOB.  + mild dizziness.  No ear pain.  Does not have skin sensitivity.   Review of Systems For ROS see HPI     Objective:   Physical Exam Vitals reviewed.  Constitutional:      General: She is not in acute distress.    Appearance: She is well-developed. She is ill-appearing.  HENT:     Head: Normocephalic and atraumatic.     Right Ear: Tympanic membrane and ear canal normal.     Left Ear: Tympanic membrane and ear canal normal.     Nose: Congestion and rhinorrhea present.     Comments: No TTP over frontal or maxillary sinuses    Mouth/Throat:     Mouth: Mucous membranes are moist.     Pharynx: No oropharyngeal exudate or posterior oropharyngeal erythema.  Eyes:     Conjunctiva/sclera: Conjunctivae normal.     Pupils: Pupils are equal, round, and reactive to light.  Cardiovascular:     Rate and Rhythm: Normal rate and regular rhythm.     Heart sounds: Normal heart sounds. No murmur heard. Pulmonary:     Effort: Pulmonary effort is normal. No respiratory distress.     Breath sounds: Normal breath sounds. No wheezing.     Comments: + hacking cough Musculoskeletal:     Cervical back: Normal range of motion and neck supple.  Lymphadenopathy:     Cervical: No cervical adenopathy.  Skin:    General: Skin is warm and dry.  Neurological:     General: No focal deficit present.     Mental Status: She is alert.           Assessment & Plan:  URI- new.  Sxs consistent w/ viral illness.  No need for abx.  Did discuss that it may be too early for COVID to show + and she should be cautious around others and mask.  Cough syrup prn.   Reviewed supportive care and red flags that should prompt return.  Pt expressed understanding and is in agreement w/ plan.

## 2022-06-24 ENCOUNTER — Encounter: Payer: Self-pay | Admitting: Family Medicine

## 2022-06-24 ENCOUNTER — Ambulatory Visit: Payer: Medicare PPO | Admitting: Family Medicine

## 2022-06-24 VITALS — BP 130/84 | HR 52 | Temp 97.2°F | Resp 17 | Ht 65.5 in | Wt 199.5 lb

## 2022-06-24 DIAGNOSIS — R062 Wheezing: Secondary | ICD-10-CM | POA: Diagnosis not present

## 2022-06-24 MED ORDER — ALBUTEROL SULFATE HFA 108 (90 BASE) MCG/ACT IN AERS: 2.0000 | INHALATION_SPRAY | Freq: Four times a day (QID) | RESPIRATORY_TRACT | 0 refills | Status: DC | PRN

## 2022-06-24 MED ORDER — GUAIFENESIN-CODEINE 100-10 MG/5ML PO SYRP
10.0000 mL | ORAL_SOLUTION | Freq: Three times a day (TID) | ORAL | 0 refills | Status: DC | PRN
Start: 2022-06-24 — End: 2023-07-24

## 2022-06-24 NOTE — Progress Notes (Signed)
   Subjective:    Patient ID: Elizabeth Lin, female    DOB: 23-Jul-1946, 76 y.o.   MRN: [id number]  HPI Chest congestion- pt was seen last week on 9/22 and tested negative for both flu and COVID.  Pt reports 'i'm better than I was' after spending 3 days sleeping.  Pt reports breathing sounds 'normal for the most part' but has some 'crackles at the end'.  No recent fevers.  Body aches have resolved.     Review of Systems For ROS see HPI     Objective:   Physical Exam Vitals reviewed.  Constitutional:      General: She is not in acute distress.    Appearance: Normal appearance. She is not ill-appearing.  HENT:     Head: Normocephalic and atraumatic.  Cardiovascular:     Rate and Rhythm: Normal rate and regular rhythm.     Pulses: Normal pulses.     Heart sounds: Normal heart sounds.  Pulmonary:     Effort: Pulmonary effort is normal. No respiratory distress.     Breath sounds: Wheezing (diffuse end expiratory wheezes) present.     Comments: Wet cough Musculoskeletal:     Cervical back: Normal range of motion.  Lymphadenopathy:     Cervical: No cervical adenopathy.  Neurological:     General: No focal deficit present.     Mental Status: She is alert and oriented to person, place, and time.  Psychiatric:        Mood and Affect: Mood normal.        Behavior: Behavior normal.        Thought Content: Thought content normal.           Assessment & Plan:  Wheezing- new.  Pt reports feeling much better than last week.  Cough is better, body aches have resolved.  Does have wheezing on exam today but no crackles.  Start Albuterol and refill cough syrup.  Reviewed supportive care and red flags that should prompt return.  Pt expressed understanding and is in agreement w/ plan.

## 2022-06-24 NOTE — Patient Instructions (Signed)
Follow up as needed or as scheduled Continue to use the cough syrup as needed USE the Albuterol inhaler- 2 puffs- as needed for cough, congestion, wheezing Continue to drink LOTS of fluids Allow yourself time to recover!!!  Sleep when needed! Call with any questions or concerns Hang in there!!!

## 2022-07-01 ENCOUNTER — Telehealth: Payer: Self-pay | Admitting: Family Medicine

## 2022-07-01 NOTE — Telephone Encounter (Signed)
Pt called back. Pt is still taking robitussin. Pt hasn't use her inhaler. I told her it was okay to use inhaler and that doesn't help her. She can schedule another appt with [place].

## 2022-07-01 NOTE — Telephone Encounter (Signed)
Called to find out if patient has been taking the robitussin that was prescribed on 06/24/22 with the albuterol. If not would recommend taking this to start and if no improvement then should follow up

## 2022-07-01 NOTE — Telephone Encounter (Signed)
Caller name: Virgen  On DPR? :yes/no: Yes  Call back number: [phone]  Provider they see: Carlota Raspberry  Reason for call:  Pt calling b/c she has been to see Dr. Birdie Riddle for persistent cough/wheezing twice. She was wondering if something could be called in to help with it or if there is something OTC that can be recommended.

## 2022-07-02 ENCOUNTER — Telehealth: Payer: Self-pay | Admitting: Family Medicine

## 2022-07-02 NOTE — Telephone Encounter (Signed)
Spoke with pt about get both vaccines done in two weeks. I stated that if she feel better in two weeks we can schedule those vaccines for her.

## 2022-07-02 NOTE — Telephone Encounter (Signed)
Caller name: [name]   On DPR? :yes/no: Yes  Call back number: [phone]  Provider they see:  Carlota Raspberry   Reason for call: Pt want to know is it okay to get her tdap and flu shots. Pt is still coughing. Pt states that her cough is a dry cough. If pt can't get for her shots now, how long should she wait to get them.

## 2022-07-02 NOTE — Telephone Encounter (Signed)
I advised pt it would be best if she waited two weeks to get the vaccines due to her immune system already being low

## 2022-07-02 NOTE — Telephone Encounter (Signed)
Pt has made her a nurse only visit to get the vaccines on 07/16/22

## 2022-07-16 ENCOUNTER — Ambulatory Visit: Payer: Medicare PPO

## 2022-07-21 ENCOUNTER — Other Ambulatory Visit: Payer: Self-pay | Admitting: Family Medicine

## 2022-07-30 ENCOUNTER — Other Ambulatory Visit: Payer: Self-pay | Admitting: Family Medicine

## 2022-07-30 DIAGNOSIS — E039 Hypothyroidism, unspecified: Secondary | ICD-10-CM

## 2022-07-31 ENCOUNTER — Ambulatory Visit: Payer: Medicare PPO

## 2022-08-06 ENCOUNTER — Ambulatory Visit (INDEPENDENT_AMBULATORY_CARE_PROVIDER_SITE_OTHER): Payer: Medicare PPO | Admitting: Family Medicine

## 2022-08-06 ENCOUNTER — Ambulatory Visit: Payer: Medicare PPO

## 2022-08-06 DIAGNOSIS — Z23 Encounter for immunization: Secondary | ICD-10-CM | POA: Diagnosis not present

## 2022-08-06 NOTE — Progress Notes (Signed)
Pt came in today for her High dose flu vaccine . Pt tolerated injection well . Gave injection in right deltoid

## 2022-09-05 ENCOUNTER — Other Ambulatory Visit: Payer: Self-pay | Admitting: Family Medicine

## 2022-09-05 DIAGNOSIS — I10 Essential (primary) hypertension: Secondary | ICD-10-CM

## 2022-10-01 ENCOUNTER — Other Ambulatory Visit: Payer: Self-pay | Admitting: Family Medicine

## 2022-10-01 DIAGNOSIS — E785 Hyperlipidemia, unspecified: Secondary | ICD-10-CM

## 2022-10-19 ENCOUNTER — Other Ambulatory Visit: Payer: Self-pay | Admitting: Family Medicine

## 2022-10-19 DIAGNOSIS — F32A Depression, unspecified: Secondary | ICD-10-CM

## 2022-10-27 ENCOUNTER — Other Ambulatory Visit: Payer: Self-pay | Admitting: Family Medicine

## 2022-10-27 DIAGNOSIS — E785 Hyperlipidemia, unspecified: Secondary | ICD-10-CM

## 2022-10-27 DIAGNOSIS — L299 Pruritus, unspecified: Secondary | ICD-10-CM

## 2022-10-29 DIAGNOSIS — L719 Rosacea, unspecified: Secondary | ICD-10-CM | POA: Diagnosis not present

## 2022-10-29 DIAGNOSIS — L821 Other seborrheic keratosis: Secondary | ICD-10-CM | POA: Diagnosis not present

## 2022-10-29 DIAGNOSIS — L578 Other skin changes due to chronic exposure to nonionizing radiation: Secondary | ICD-10-CM | POA: Diagnosis not present

## 2022-11-06 DIAGNOSIS — M25511 Pain in right shoulder: Secondary | ICD-10-CM | POA: Diagnosis not present

## 2022-11-06 DIAGNOSIS — M19012 Primary osteoarthritis, left shoulder: Secondary | ICD-10-CM | POA: Diagnosis not present

## 2022-11-09 DIAGNOSIS — M19019 Primary osteoarthritis, unspecified shoulder: Secondary | ICD-10-CM | POA: Insufficient documentation

## 2022-11-13 ENCOUNTER — Telehealth: Payer: Self-pay

## 2022-11-13 NOTE — Telephone Encounter (Signed)
   Pre-operative Risk Assessment    Patient Name: ALAISA Lin  DOB: 04-20-1946 MRN: [id number]      Request for Surgical Clearance    Procedure:   Left Reverse TSA  Date of Surgery:  Clearance TBD                                 Surgeon:  Dr. Esmond Plants   Surgeon's Group or Practice Name:  [facility] Phone number:  [phone] Fax number:  [phone]   Type of Clearance Requested:   - Medical    Type of Anesthesia:  Not Indicated   Additional requests/questions:  Please advise surgeon/provider what medications should be held. Please fax a copy of last office note and/or and pertinent records to the surgeon's office.  Marylene Buerger   11/13/2022, 9:29 AM

## 2022-11-13 NOTE — Telephone Encounter (Signed)
   Name: Elizabeth Lin  DOB: 11-Nov-1945  MRN: [id number]  Primary Cardiologist: Glenetta Hew, MD   Preoperative team, please contact this patient and set up a phone call appointment for further preoperative risk assessment. Please obtain consent and complete medication review. Thank you for your help.   Mayra Reel, NP 11/13/2022, 11:57 AM [name]

## 2022-11-25 ENCOUNTER — Telehealth: Payer: Self-pay | Admitting: *Deleted

## 2022-11-25 NOTE — Telephone Encounter (Signed)
Pt has been scheduled for tele visit 12/17/22 @ 9 am. Pt tells me that she has a second opinion on 12/16/22 with [facility] about her surgery. Pt wants to do her tele appt 12/17/22 and will d/w if she has decided to continue with Dr. Esmond Plants at [facility] or if she is going to go with [facility]. Pt thanked me for the help in this matter.

## 2022-11-25 NOTE — Telephone Encounter (Signed)
Pt has been scheduled for tele visit 12/17/22 @ 9 am. Pt tells me that she has a second opinion on 12/16/22 with [facility] about her surgery. Pt wants to do her tele appt 12/17/22 and will d/w if she has decided to continue with Dr. Esmond Plants at [facility] or if she is going to go with [facility]. Pt thanked me for the help in this matter.     Patient Consent for Virtual Visit        Elizabeth Lin has provided verbal consent on 11/25/2022 for a virtual visit (video or telephone).   CONSENT FOR VIRTUAL VISIT FOR:  Elizabeth Lin  By participating in this virtual visit I agree to the following:  I hereby voluntarily request, consent and authorize [facility] and its employed or contracted physicians, physician assistants, nurse practitioners or other licensed health care professionals (the Practitioner), to provide me with telemedicine health care services (the "Services") as deemed necessary by the treating Practitioner. I acknowledge and consent to receive the Services by the Practitioner via telemedicine. I understand that the telemedicine visit will involve communicating with the Practitioner through live audiovisual communication technology and the disclosure of certain medical information by electronic transmission. I acknowledge that I have been given the opportunity to request an in-person assessment or other available alternative prior to the telemedicine visit and am voluntarily participating in the telemedicine visit.  I understand that I have the right to withhold or withdraw my consent to the use of telemedicine in the course of my care at any time, without affecting my right to future care or treatment, and that the Practitioner or I may terminate the telemedicine visit at any time. I understand that I have the right to inspect all information obtained and/or recorded in the course of the telemedicine visit and may receive copies of available information for a reasonable fee.  I  understand that some of the potential risks of receiving the Services via telemedicine include:  Delay or interruption in medical evaluation due to technological equipment failure or disruption; Information transmitted may not be sufficient (e.g. poor resolution of images) to allow for appropriate medical decision making by the Practitioner; and/or  In rare instances, security protocols could fail, causing a breach of personal health information.  Furthermore, I acknowledge that it is my responsibility to provide information about my medical history, conditions and care that is complete and accurate to the best of my ability. I acknowledge that Practitioner's advice, recommendations, and/or decision may be based on factors not within their control, such as incomplete or inaccurate data provided by me or distortions of diagnostic images or specimens that may result from electronic transmissions. I understand that the practice of medicine is not an exact science and that Practitioner makes no warranties or guarantees regarding treatment outcomes. I acknowledge that a copy of this consent can be made available to me via my patient portal ([facility]), or I can request a printed copy by calling the office of [name].    I understand that my insurance will be billed for this visit.   I have read or had this consent read to me. I understand the contents of this consent, which adequately explains the benefits and risks of the Services being provided via telemedicine.  I have been provided ample opportunity to ask questions regarding this consent and the Services and have had my questions answered to my satisfaction. I give my informed consent for the services  to be provided through the use of telemedicine in my medical care

## 2022-11-27 ENCOUNTER — Telehealth: Payer: Self-pay

## 2022-11-27 NOTE — Telephone Encounter (Signed)
Called patient no answer, pt needs an appointment for surgical clearance

## 2022-11-27 NOTE — Telephone Encounter (Signed)
Spoke with patient about this surgical clearance. Patient states that she don't have date for her surgery yet. Patient is going to [facility] to get a second opinion with Dr.Anakwenze on 12/15/22. Patient is will call us when she has final decide on surgery. Patient is aware that she need this surgical clearance before her surgery.

## 2022-12-01 ENCOUNTER — Ambulatory Visit (HOSPITAL_COMMUNITY): Payer: Medicare PPO | Attending: Cardiology

## 2022-12-01 DIAGNOSIS — I350 Nonrheumatic aortic (valve) stenosis: Secondary | ICD-10-CM | POA: Insufficient documentation

## 2022-12-01 DIAGNOSIS — I4510 Unspecified right bundle-branch block: Secondary | ICD-10-CM | POA: Insufficient documentation

## 2022-12-01 LAB — ECHOCARDIOGRAM COMPLETE
AR max vel: 2.25 cm2
AV Area VTI: 2.27 cm2
AV Area mean vel: 2.12 cm2
AV Mean grad: 8 mmHg
AV Peak grad: 14.5 mmHg
Ao pk vel: 1.91 m/s
Area-P 1/2: 2.69 cm2
S' Lateral: 3 cm

## 2022-12-02 ENCOUNTER — Telehealth: Payer: Medicare PPO

## 2022-12-16 DIAGNOSIS — M19011 Primary osteoarthritis, right shoulder: Secondary | ICD-10-CM | POA: Diagnosis not present

## 2022-12-16 DIAGNOSIS — M25512 Pain in left shoulder: Secondary | ICD-10-CM | POA: Diagnosis not present

## 2022-12-16 DIAGNOSIS — M25511 Pain in right shoulder: Secondary | ICD-10-CM | POA: Diagnosis not present

## 2022-12-16 DIAGNOSIS — M19012 Primary osteoarthritis, left shoulder: Secondary | ICD-10-CM | POA: Diagnosis not present

## 2022-12-16 NOTE — Progress Notes (Unsigned)
Virtual Visit via Telephone Note   Because of Elizabeth Lin's co-morbid illnesses, she is at least at moderate risk for complications without adequate follow up.  This format is felt to be most appropriate for this patient at this time.  The patient did not have access to video technology/had technical difficulties with video requiring transitioning to audio format only (telephone).  All issues noted in this document were discussed and addressed.  No physical exam could be performed with this format.  Please refer to the patient's chart for her consent to telehealth for [facility].  Evaluation Performed:  Preoperative cardiovascular risk assessment _____________   Date:  12/16/2022   Patient ID:  Elizabeth Lin, DOB 24-May-1946, MRN [id number] Patient Location:  Home Provider location:   Office  Primary Care Provider:  Wendie Agreste, MD Primary Cardiologist:  Glenetta Hew, MD  Chief Complaint / Patient Profile   77 y.o. y/o female with a h/o chronic RBBB, mild aortic stenosis, hypertension, hyperlipidemia  who is pending left reverse TSA by Dr. Veverly Fells and presents today for telephonic preoperative cardiovascular risk assessment.  History of Present Illness    Elizabeth Lin is a 77 y.o. female who presents via audio/video conferencing for a telehealth visit today.  Pt was last seen in cardiology clinic on 01/20/2022 by Dr. Ellyn Hack.  At that time Elizabeth Lin was doing well.  The patient is now pending procedure as outlined above. Since her last visit, she ***  Past Medical History    Past Medical History:  Diagnosis Date   Allergy    Anxiety    Arthritis    Carpal tunnel syndrome    right hand, getting injections   Cataract    Depression    Dysrhythmia    BBB   Hammer toe    Hypothyroidism due to Hashimoto's thyroiditis    Meniere disease, right    Past Surgical History:  Procedure Laterality Date   APPENDECTOMY     COLONOSCOPY      CYSTECTOMY     eye   LAPAROSCOPIC ASSISTED SPIGELIAN HERNIA REPAIR N/A 11/08/2019   Procedure: LAPAROSCOPIC ASSISTED SPIGELIAN HERNIA REPAIR WITH MESH;  Surgeon: Donnie Mesa, MD;  Location: [facility];  Service: General;  Laterality: N/A;   OOPHORECTOMY Right    TONSILLECTOMY     TRANSTHORACIC ECHOCARDIOGRAM  12/09/2019   EF 60 to 65%.  No R WMA.  GR 1 DD.  Normal RV function.  Aortic calcification with sclerosis but no suggestion of stenosis.  (Mean gradient 8 mmHg)    Allergies  Allergies  Allergen Reactions   Penicillins Rash    Did it involve swelling of the face/tongue/throat, SOB, or low BP? No Did it involve sudden or severe rash/hives, skin peeling, or any reaction on the inside of your mouth or nose? Yes Did you need to seek medical attention at a hospital or doctor's office? No When did it last happen?  ~6-10 years ago     If all above answers are "NO", may proceed with cephalosporin use.    Amoxicillin-Pot Clavulanate     Red rash Did it involve swelling of the face/tongue/throat, SOB, or low BP? No Did it involve sudden or severe rash/hives, skin peeling, or any reaction on the inside of your mouth or nose? Yes Did you need to seek medical attention at a hospital or doctor's office? No When did it last happen? ~6-10 years ago    If all above answers are "  NO", may proceed with cephalosporin use.    Sulfa Antibiotics     Home Medications    Prior to Admission medications   Medication Sig Start Date End Date Taking? Authorizing Provider  albuterol (VENTOLIN HFA) 108 (90 Base) MCG/ACT inhaler TAKE 2 PUFFS BY MOUTH EVERY 6 HOURS AS NEEDED FOR WHEEZE OR SHORTNESS OF BREATH 07/21/22   Wendie Agreste, MD  atorvastatin (LIPITOR) 10 MG tablet TAKE 1 TABLET BY MOUTH 3 TIMES A WEEK. 10/01/22   Wendie Agreste, MD  Calcium-Vitamin D-Vitamin K (CALCIUM + D + K PO) Take 1 tablet by mouth 2 (two) times daily. 1300 mg+D1000 IU    [provider]  DULoxetine (CYMBALTA) 20  MG capsule TAKE 2 CAPSULES BY MOUTH EVERY DAY 10/20/22   Wendie Agreste, MD  fish oil-omega-3 fatty acids 1000 MG capsule Take 1 g by mouth 2 (two) times daily.     [provider]  Gluc-Chonn-MSM-Boswellia-Vit D (GLUCOSAMINE CHOND TRIPLE/VIT D) TABS Take 1 tablet by mouth 2 (two) times daily.    [provider]  guaiFENesin-codeine (ROBITUSSIN AC) 100-10 MG/5ML syrup Take 10 mLs by mouth 3 (three) times daily as needed for cough. 06/24/22   Midge Minium, MD  hydrochlorothiazide (HYDRODIURIL) 25 MG tablet TAKE 1 TABLET (25 MG TOTAL) BY MOUTH DAILY. 09/05/22   Wendie Agreste, MD  ketoconazole (NIZORAL) 2 % shampoo APPLY 1 APPLICATION TOPICALLY ONCE A WEEK. 10/27/22   Wendie Agreste, MD  levothyroxine (SYNTHROID) 150 MCG tablet TAKE 1 TABLET BY MOUTH EVERY DAY 07/30/22   Wendie Agreste, MD  naproxen sodium (ALEVE) 220 MG tablet Take 220-440 mg by mouth 2 (two) times daily as needed (pain/soreness).    [provider]  Polyethyl Glycol-Propyl Glycol (LUBRICANT EYE DROPS) 0.4-0.3 % SOLN Place 1 drop into both eyes 3 (three) times daily as needed (dry/irritated eyes.). Rohto Ice Multi-Symptom Relief    [provider]    Physical Exam    Vital Signs:  Elizabeth Lin does not have vital signs available for review today.***  Given telephonic nature of communication, physical exam is limited. AAOx3. NAD. Normal affect.  Speech and respirations are unlabored.  Accessory Clinical Findings    None  Assessment & Plan    Primary Cardiologist: Glenetta Hew, MD  Preoperative cardiovascular risk assessment. Left reverse TSA by Dr. Veverly Fells.   Chart reviewed as part of pre-operative protocol coverage. According to the RCRI, patient has a 0.4% risk of MACE. Patient reports activity equivalent to 4.0 METS (***).   Given past medical history and time since last visit, based on ACC/AHA guidelines, Elizabeth Lin would be at acceptable risk for the  planned procedure without further cardiovascular testing.   Patient was advised that if she develops new symptoms prior to surgery to contact our office to arrange a follow-up appointment.  she verbalized understanding.  2. SBE/Anti-coag.  ***  I will route this recommendation to the requesting party via Epic fax function.  Please call with questions.  Time:   Today, I have spent *** minutes with the patient with telehealth technology discussing medical history, symptoms, and management plan.     Mayra Reel, NP  12/16/2022, 5:43 PM

## 2022-12-17 ENCOUNTER — Telehealth: Payer: Self-pay | Admitting: Family Medicine

## 2022-12-17 ENCOUNTER — Ambulatory Visit: Payer: Medicare PPO | Attending: Cardiology | Admitting: Student

## 2022-12-17 DIAGNOSIS — Z01810 Encounter for preprocedural cardiovascular examination: Secondary | ICD-10-CM

## 2022-12-17 NOTE — Telephone Encounter (Signed)
[facility] Preoperative Risk Assessment placed in front bin.

## 2022-12-17 NOTE — Telephone Encounter (Signed)
Called pt and confirmed, she has been scheduled

## 2022-12-17 NOTE — Telephone Encounter (Signed)
Pt had previously stated unsure about surgery and she was going to wait, will follow up and see if anything has changed with the patients plans .

## 2022-12-24 DIAGNOSIS — M6701 Short Achilles tendon (acquired), right ankle: Secondary | ICD-10-CM | POA: Diagnosis not present

## 2022-12-24 DIAGNOSIS — M19071 Primary osteoarthritis, right ankle and foot: Secondary | ICD-10-CM | POA: Diagnosis not present

## 2022-12-30 DIAGNOSIS — G8929 Other chronic pain: Secondary | ICD-10-CM | POA: Diagnosis not present

## 2022-12-30 DIAGNOSIS — M25512 Pain in left shoulder: Secondary | ICD-10-CM | POA: Diagnosis not present

## 2022-12-30 DIAGNOSIS — M25511 Pain in right shoulder: Secondary | ICD-10-CM | POA: Diagnosis not present

## 2022-12-30 DIAGNOSIS — M19012 Primary osteoarthritis, left shoulder: Secondary | ICD-10-CM | POA: Diagnosis not present

## 2022-12-31 ENCOUNTER — Encounter: Payer: Self-pay | Admitting: Family Medicine

## 2022-12-31 ENCOUNTER — Ambulatory Visit: Payer: Medicare PPO | Admitting: Family Medicine

## 2022-12-31 VITALS — BP 130/84 | HR 60 | Temp 98.8°F | Ht 65.0 in | Wt 200.2 lb

## 2022-12-31 DIAGNOSIS — I10 Essential (primary) hypertension: Secondary | ICD-10-CM

## 2022-12-31 DIAGNOSIS — E785 Hyperlipidemia, unspecified: Secondary | ICD-10-CM | POA: Diagnosis not present

## 2022-12-31 DIAGNOSIS — R42 Dizziness and giddiness: Secondary | ICD-10-CM

## 2022-12-31 DIAGNOSIS — E039 Hypothyroidism, unspecified: Secondary | ICD-10-CM

## 2022-12-31 NOTE — Progress Notes (Signed)
Subjective:  Patient ID: Elizabeth Lin, female    DOB: 1946/07/19  Age: 77 y.o. MRN: [id number]  CC:  Chief Complaint  Patient presents with   Medical Clearance    Surgical clarence. Preoperative risk assessment attached to Clint presents for  Preoperative evaluation? Plan for  left reverse total shoulder arthroplasty.  Has appt with ortho on Monday at [facility], will be planning on surgery but specifics of preop eval not known. Form provided today is for Dr. Veverly Fells, but she will be having surgery with [facility] surgeon.   Cardiologist, Dr. Ellyn Hack.   She has a history of hypertension, mild aortic stenosis, pedal edema, family history of premature CAD, hypothyroidism, depression.  Preop cardiovascular exam on March 20 noted, [place], appears that was canceled as to seek care elsewhere at that time.   Last chronic med visit with me 03/2022 with planned 6 month follow up.   Hypothyroidism Lab Results  Component Value Date   TSH 5.40 02/26/2022  Synthroid 133mcg qd?  Last rx for 126mcg qd.  Taking medication daily.  No new hot or cold intolerance. No new hair or skin changes, heart palpitations or new fatigue. Wt Readings from Last 3 Encounters:  12/31/22 200 lb 3.2 oz (90.8 kg)  06/24/22 199 lb 8 oz (90.5 kg)  06/20/22 198 lb 8 oz (90 kg)    Hypertension: Occasional missed dose hctz, last week once. Hctz 25mg  qd usually.  Slight rare dizziness, 1-2 times per week with standing and quick movement. Past few months. No syncope/nearsyncope. No dark stools or blood in stool. No focal weakness or slurred speech.  Some swelling with weight gain. Chronic edema.  Drinking 6 bottles of water per day.  Home readings:none.  BP Readings from Last 3 Encounters:  12/31/22 130/84  06/24/22 130/84  06/20/22 118/62   Lab Results  Component Value Date   CREATININE 0.90 02/26/2022   Depression: Cymbalta 40mg  total per day. working well. No new  side effects.      12/31/2022    4:25 PM 06/20/2022   10:34 AM 04/17/2022    8:19 AM 03/20/2022    2:44 PM 03/20/2022    2:42 PM  Depression screen PHQ 2/9  Decreased Interest 0 0 3 0 0  Down, Depressed, Hopeless 0 0 1 0 0  PHQ - 2 Score 0 0 4 0 0  Altered sleeping 2 0 3    Tired, decreased energy 2 0 2    Change in appetite 2 0 1    Feeling bad or failure about yourself  0 0 1    Trouble concentrating 0 0 1    Moving slowly or fidgety/restless 0 0 2    Suicidal thoughts 0 0 0    PHQ-9 Score 6 0 14    Difficult doing work/chores Not difficult at all Not difficult at all Somewhat difficult     Hyperlipidemia: Lipitor 10mg  qd. No new side effects.  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  Meniere disease, left 12/12/2017   Mild aortic stenosis 11/10/2017   Bilateral edema of lower extremity 11/10/2017   Family history of premature CAD 11/10/2017   Arthralgia of multiple joints 10/23/2017   Class 1 obesity due to excess calories without serious comorbidity with body mass index (BMI) of 32.0 to 32.9 in adult 10/23/2017   Essential hypertension 10/23/2017   Right bundle branch block (RBBB) 10/23/2017   Pure hypercholesterolemia 10/23/2017   Vitamin D deficiency 09/06/2014   Right knee pain 02/24/2013   Hypothyroid 11/02/2011   Depression 11/02/2011   History of positive PPD 11/02/2011   Past Medical History:  Diagnosis Date   Allergy    Anxiety    Arthritis    Carpal tunnel syndrome    right hand, getting injections   Cataract    Depression    Dysrhythmia    BBB   Hammer toe    Hypothyroidism due to Hashimoto's  thyroiditis    Meniere disease, right    Past Surgical History:  Procedure Laterality Date   APPENDECTOMY     COLONOSCOPY     CYSTECTOMY     eye   LAPAROSCOPIC ASSISTED SPIGELIAN HERNIA REPAIR N/A 11/08/2019   Procedure: LAPAROSCOPIC ASSISTED SPIGELIAN HERNIA REPAIR WITH MESH;  Surgeon: Donnie Mesa, MD;  Location: [facility];  Service: General;  Laterality: N/A;   OOPHORECTOMY Right    TONSILLECTOMY     TRANSTHORACIC ECHOCARDIOGRAM  12/09/2019   EF 60 to 65%.  No R WMA.  GR 1 DD.  Normal RV function.  Aortic calcification with sclerosis but no suggestion of stenosis.  (Mean gradient 8 mmHg)   Allergies  Allergen Reactions   Penicillins Rash    Did it involve swelling of the face/tongue/throat, SOB, or low BP? No Did it involve sudden or severe rash/hives, skin peeling, or any reaction on the inside of your mouth or nose? Yes Did you need to seek medical attention at a hospital or doctor's office? No When did it last happen?  ~6-10 years ago     If all above answers are "NO", may proceed with cephalosporin use.    Amoxicillin-Pot Clavulanate     Red rash Did it involve swelling of the face/tongue/throat, SOB, or low BP? No Did it involve sudden or severe rash/hives, skin peeling, or any reaction on the inside of your mouth or nose? Yes Did you need to seek medical attention at a hospital or doctor's office? No When did it last happen? ~6-10 years ago    If all above answers are "NO", may proceed with cephalosporin use.    Sulfa Antibiotics    Prior to Admission medications   Medication Sig Start Date End Date Taking? Authorizing Provider  albuterol (VENTOLIN HFA) 108 (90 Base) MCG/ACT inhaler TAKE 2 PUFFS BY MOUTH EVERY 6 HOURS AS NEEDED FOR WHEEZE OR SHORTNESS OF BREATH 07/21/22  Yes Wendie Agreste, MD  atorvastatin (LIPITOR) 10 MG tablet TAKE 1 TABLET BY MOUTH 3 TIMES A WEEK. 10/01/22  Yes Wendie Agreste, MD  Calcium-Vitamin D-Vitamin K (CALCIUM + D + K PO) Take 1 tablet by  mouth 2 (two) times daily. 1300 mg+D1000 IU   Yes [provider]  DULoxetine (CYMBALTA) 20 MG capsule TAKE 2 CAPSULES BY MOUTH EVERY DAY 10/20/22  Yes Wendie Agreste, MD  fish oil-omega-3 fatty acids 1000 MG capsule Take 1 g by mouth 2 (two) times daily.    Yes [provider]  Gluc-Chonn-MSM-Boswellia-Vit D (GLUCOSAMINE CHOND TRIPLE/VIT D)  TABS Take 1 tablet by mouth 2 (two) times daily.   Yes [provider]  guaiFENesin-codeine (ROBITUSSIN AC) 100-10 MG/5ML syrup Take 10 mLs by mouth 3 (three) times daily as needed for cough. 06/24/22  Yes Midge Minium, MD  hydrochlorothiazide (HYDRODIURIL) 25 MG tablet TAKE 1 TABLET (25 MG TOTAL) BY MOUTH DAILY. 09/05/22  Yes Wendie Agreste, MD  ketoconazole (NIZORAL) 2 % shampoo APPLY 1 APPLICATION TOPICALLY ONCE A WEEK. 10/27/22  Yes Wendie Agreste, MD  levothyroxine (SYNTHROID) 150 MCG tablet TAKE 1 TABLET BY MOUTH EVERY DAY 07/30/22  Yes Wendie Agreste, MD  naproxen sodium (ALEVE) 220 MG tablet Take 220-440 mg by mouth 2 (two) times daily as needed (pain/soreness).   Yes [provider]  Polyethyl Glycol-Propyl Glycol (LUBRICANT EYE DROPS) 0.4-0.3 % SOLN Place 1 drop into both eyes 3 (three) times daily as needed (dry/irritated eyes.). 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  3 children, 3 grandchildren.   Patient's  Education: [employer].  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.   Patient is right handed   Caffeine consumption 2-3 daily    Walks daily for roughly 30 minutes at a time.  She has not been doing it in the wintertime due to cold weather.      Social Determinants of Health   Financial Resource Strain: Low Risk  (03/20/2022)   Overall Financial Resource Strain (CARDIA)    Difficulty of Paying Living Expenses: Not hard at all  Food Insecurity: No Food Insecurity (03/20/2022)   Hunger Vital Sign    Worried About Running Out of Food in the Last Year: Never true    Ran Out of Food in the Last Year: Never true  Transportation Needs: No Transportation Needs (03/20/2022)   PRAPARE - Hydrologist (Medical): No    Lack of Transportation (Non-Medical): No  Physical Activity: Insufficiently Active (03/20/2022)   Exercise Vital Sign    Days of Exercise per Week: 3 days    Minutes of Exercise per Session: 30 min  Stress: No Stress Concern Present (03/20/2022)   [place]    Feeling of Stress : Not at all  Social Connections: Moderately Isolated (03/20/2022)   Social Connection and Isolation Panel [NHANES]    Frequency of Communication with Friends and Family: Twice a week    Frequency of Social Gatherings with Friends and Family: Twice a week    Attends Religious Services: Never    Active  Member of Clubs or Organizations: Yes    Attends Archivist Meetings: 1 to 4 times per year    Marital Status: Divorced  Intimate Partner Violence: Not At Risk (03/20/2022)   Humiliation, Afraid, Rape, and Kick questionnaire    Fear of Current or Ex-Partner:  No    Emotionally Abused: No    Physically Abused: No    Sexually Abused: No    Review of Systems  Constitutional:  Negative for fatigue and unexpected weight change.  Respiratory:  Negative for chest tightness and shortness of breath.   Cardiovascular:  Negative for chest pain, palpitations and leg swelling.  Gastrointestinal:  Negative for abdominal pain and blood in stool.  Neurological:  Positive for light-headedness (fleeting - past few months.). Negative for syncope and headaches.   Per HPI.   Objective:   Vitals:   12/31/22 1622 12/31/22 1716  BP: (!) 162/84 130/84  Pulse: 60   Temp: 98.8 F (37.1 C)   TempSrc: Temporal   SpO2: 96%   Weight: 200 lb 3.2 oz (90.8 kg)   Height: 5\' 5"  (1.651 m)      Physical Exam Vitals reviewed.  Constitutional:      Appearance: Normal appearance. She is well-developed.  HENT:     Head: Normocephalic and atraumatic.  Eyes:     Conjunctiva/sclera: Conjunctivae normal.     Pupils: Pupils are equal, round, and reactive to light.  Neck:     Vascular: No carotid bruit.  Cardiovascular:     Rate and Rhythm: Normal rate and regular rhythm.     Heart sounds: Normal heart sounds.  Pulmonary:     Effort: Pulmonary effort is normal.     Breath sounds: Normal breath sounds.  Abdominal:     Palpations: Abdomen is soft. There is no pulsatile mass.     Tenderness: There is no abdominal tenderness.  Musculoskeletal:     Right lower leg: Edema (trace, diffuse LE bilateral) present.     Left lower leg: Edema present.  Skin:    General: Skin is warm and dry.  Neurological:     General: No focal deficit present.     Mental Status: She is alert and oriented to person, place, and time.  Psychiatric:        Mood and Affect: Mood normal.        Behavior: Behavior normal.     Assessment & Plan:  Elizabeth Lin is a 77 y.o. female . Hyperlipidemia, unspecified hyperlipidemia type - Plan: Comprehensive metabolic panel, Lipid  panel  Hypothyroidism, unspecified type - Plan: TSH  Essential hypertension  Episode of dizziness - Plan: CBC  Repeat blood pressure reassuring, intermittent fleeting lightheadedness/dizziness, possible orthostatic versus middle ear with quick movement.    Nonfocal, reassuring  exam at present.  Reports sufficient hydration, but encouraged to maintain hydration to minimize orthostasis.  Check CBC with other labs as above for maintenance/monitoring of chronic medical conditions, placed as a lab only visit.  Infrequent missed doses of HCTZ may contribute to some of the chronic edema and intermittent worsening.  Weight gain may also be contributing.  Lungs clear, no sign of fluid overload otherwise.No medication changes for now.  Recheck next 2 weeks with RTC/ER precautions if acute worsening.  In regards to preoperative evaluation, I did not complete a preop eval formally today as I am unsure if her surgeon at [facility] will need that performed.  If they do need preop eval, can add other blood work  if necessary to her pending lab visit.  If preop eval is needed for her surgeon at [facility], recommend she proceed with cardiac evaluation through her cardiologist office.  Advised to let me know how I can help further regarding preop evaluation.    No orders of the defined types were placed in this encounter.  Patient Instructions  I would recommend discussing surgery and preoperative evaluation with your surgeon at [facility].  Find out if they need me to complete the preop evaluation and any specific blood work that is required.  For any cardiac portion of preop evaluation, your cardiologist office may need to know specifics from your orthopedic surgeon of what is required and I would recommend reaching out to cardiology for the cardiac preop if needed.  Let me know if there are questions once you hear form you surgeon at [facility].   I will pend some labs for a lab visit, but would wait until you find more  information from [facility].   Let me know what dose synthroid you are taking at home.   I will check some labs for the dizziness (lab visit here in next few days if possible) but if that becomes worse or more frequent - be seen right away.   Recheck with me in 2 weeks to follow up concerns today and complete eval for surgery if still needed.      Signed,   Merri Ray, MD [place], [facility] 12/31/22 5:17 PM

## 2022-12-31 NOTE — Patient Instructions (Addendum)
I would recommend discussing surgery and preoperative evaluation with your surgeon at [facility].  Find out if they need me to complete the preop evaluation and any specific blood work that is required.  For any cardiac portion of preop evaluation, your cardiologist office may need to know specifics from your orthopedic surgeon of what is required and I would recommend reaching out to cardiology for the cardiac preop if needed.  Let me know if there are questions once you hear form you surgeon at [facility].   I will pend some labs for a lab visit, but would wait until you find more information from [facility].   Let me know what dose synthroid you are taking at home.   I will check some labs for the dizziness (lab visit here in next few days if possible) but if that becomes worse or more frequent - be seen right away.   Recheck with me in 2 weeks to follow up concerns today and complete eval for surgery if still needed.

## 2023-01-05 DIAGNOSIS — M19012 Primary osteoarthritis, left shoulder: Secondary | ICD-10-CM | POA: Diagnosis not present

## 2023-01-13 DIAGNOSIS — H903 Sensorineural hearing loss, bilateral: Secondary | ICD-10-CM | POA: Diagnosis not present

## 2023-01-13 DIAGNOSIS — I4510 Unspecified right bundle-branch block: Secondary | ICD-10-CM | POA: Diagnosis not present

## 2023-01-13 DIAGNOSIS — E039 Hypothyroidism, unspecified: Secondary | ICD-10-CM | POA: Diagnosis not present

## 2023-01-13 DIAGNOSIS — E785 Hyperlipidemia, unspecified: Secondary | ICD-10-CM | POA: Diagnosis not present

## 2023-01-13 DIAGNOSIS — I10 Essential (primary) hypertension: Secondary | ICD-10-CM | POA: Diagnosis not present

## 2023-01-13 DIAGNOSIS — H8109 Meniere's disease, unspecified ear: Secondary | ICD-10-CM | POA: Diagnosis not present

## 2023-01-13 DIAGNOSIS — M19012 Primary osteoarthritis, left shoulder: Secondary | ICD-10-CM | POA: Diagnosis not present

## 2023-01-15 ENCOUNTER — Encounter: Payer: Self-pay | Admitting: Family Medicine

## 2023-01-15 ENCOUNTER — Ambulatory Visit: Payer: Medicare PPO | Admitting: Family Medicine

## 2023-01-15 VITALS — BP 118/76 | HR 60 | Temp 98.7°F | Ht 65.0 in | Wt 199.6 lb

## 2023-01-15 DIAGNOSIS — E785 Hyperlipidemia, unspecified: Secondary | ICD-10-CM

## 2023-01-15 DIAGNOSIS — E039 Hypothyroidism, unspecified: Secondary | ICD-10-CM | POA: Diagnosis not present

## 2023-01-15 DIAGNOSIS — I10 Essential (primary) hypertension: Secondary | ICD-10-CM | POA: Diagnosis not present

## 2023-01-15 NOTE — Patient Instructions (Signed)
Thank you for coming in today.  Good luck on your upcoming surgery.  Although they did not need labs for your surgery I do need that lab work for monitoring of your chronic medications.  Return tomorrow for lab only visit and if any changes needed based on labs I will let you know.  Follow-up in 6 months but let me know if there are questions in the meantime.  Take care!

## 2023-01-15 NOTE — Progress Notes (Signed)
Subjective:  Patient ID: Elizabeth Lin, female    DOB: July 20, 1946  Age: 77 y.o. MRN: [id number]  CC:  Chief Complaint  Patient presents with   Hyperlipidemia    Lab work Surgery evaluation     Dizziness    Pt states she is doing better with dizziness     HPI Elizabeth Lin presents for   Follow-up from April 3 visit. Multiple concerns discussed at that time, but did note an occasional rare dizziness, possible orthostatic component, previous few months but no syncope/near syncope, rare missed  dose of her HCTZ for hypertension.Recommending increasing her hydration, CBC, other labs were ordered.  I do not see that those have been obtained.  Consistent use of HCTZ to help with chronic edema.  Lungs were clear at that visit without signs of fluid overload.   In regards to preop eval, that was not done at last visit as she was planning on seeking care through a surgeon at [facility], not local orthopedic surgeon.  Advised to discuss with them if preop eval needed and if so cardiac eval through her cardiologist office and to schedule preop medical eval with me if needed.  Appears she is scheduled April 23 for surgery with Dr. Marin Shutter. No clearance needed from me. Preop labs at [facility], but had visit few days ago without labs.   Clarified med dose - taking synthroid . Discussed other chronic meds last visit.  Dizziness improved - no further episodes, taking BP med daily. Swelling in legs about the same. Chronic edema.  Lab Results  Component Value Date   TSH 5.40 02/26/2022   BP Readings from Last 3 Encounters:  01/15/23 118/76  12/31/22 130/84  06/24/22 130/84    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   Meniere disease, left 12/12/2017   Mild aortic stenosis 11/10/2017   Bilateral edema of lower extremity 11/10/2017   Family history of premature CAD 11/10/2017   Arthralgia of multiple joints 10/23/2017   Class 1 obesity due  to excess calories without serious comorbidity with body mass index (BMI) of 32.0 to 32.9 in adult 10/23/2017   Essential hypertension 10/23/2017   Right bundle branch block (RBBB) 10/23/2017   Pure hypercholesterolemia 10/23/2017   Vitamin D deficiency 09/06/2014   Right knee pain 02/24/2013   Hypothyroid 11/02/2011   Depression 11/02/2011   History of positive PPD 11/02/2011   Past Medical History:  Diagnosis Date   Allergy    Anxiety    Arthritis    Carpal tunnel syndrome    right hand, getting injections   Cataract    Depression    Dysrhythmia    BBB   Hammer toe    Hypothyroidism due to Hashimoto's thyroiditis    Meniere disease, right    Past Surgical History:  Procedure Laterality Date   APPENDECTOMY     COLONOSCOPY     CYSTECTOMY     eye   LAPAROSCOPIC ASSISTED SPIGELIAN HERNIA REPAIR N/A 11/08/2019   Procedure: LAPAROSCOPIC ASSISTED SPIGELIAN HERNIA REPAIR WITH MESH;  Surgeon: Manus Rudd, MD;  Location: [facility];  Service: General;  Laterality: N/A;   OOPHORECTOMY Right    TONSILLECTOMY     TRANSTHORACIC ECHOCARDIOGRAM  12/09/2019   EF 60 to 65%.  No R WMA.  GR 1 DD.  Normal RV function.  Aortic calcification with sclerosis but no suggestion of stenosis.  (Mean gradient 8 mmHg)   Allergies  Allergen Reactions  Penicillins Rash    Did it involve swelling of the face/tongue/throat, SOB, or low BP? No Did it involve sudden or severe rash/hives, skin peeling, or any reaction on the inside of your mouth or nose? Yes Did you need to seek medical attention at a hospital or doctor's office? No When did it last happen?  ~6-10 years ago     If all above answers are "NO", may proceed with cephalosporin use.    Amoxicillin-Pot Clavulanate     Red rash Did it involve swelling of the face/tongue/throat, SOB, or low BP? No Did it involve sudden or severe rash/hives, skin peeling, or any reaction on the inside of your mouth or nose? Yes Did you need to seek medical  attention at a hospital or doctor's office? No When did it last happen? ~6-10 years ago    If all above answers are "NO", may proceed with cephalosporin use.    Sulfa Antibiotics    Prior to Admission medications   Medication Sig Start Date End Date Taking? Authorizing Provider  albuterol (VENTOLIN HFA) 108 (90 Base) MCG/ACT inhaler TAKE 2 PUFFS BY MOUTH EVERY 6 HOURS AS NEEDED FOR WHEEZE OR SHORTNESS OF BREATH 07/21/22  Yes Shade Flood, MD  atorvastatin (LIPITOR) 10 MG tablet TAKE 1 TABLET BY MOUTH 3 TIMES A WEEK. 10/01/22  Yes Shade Flood, MD  Calcium-Vitamin D-Vitamin K (CALCIUM + D + K PO) Take 1 tablet by mouth 2 (two) times daily. 1300 mg+D1000 IU   Yes [provider]  DULoxetine (CYMBALTA) 20 MG capsule TAKE 2 CAPSULES BY MOUTH EVERY DAY 10/20/22  Yes Shade Flood, MD  fish oil-omega-3 fatty acids 1000 MG capsule Take 1 g by mouth 2 (two) times daily.    Yes [provider]  Gluc-Chonn-MSM-Boswellia-Vit D (GLUCOSAMINE CHOND TRIPLE/VIT D) TABS Take 1 tablet by mouth 2 (two) times daily.   Yes [provider]  guaiFENesin-codeine (ROBITUSSIN AC) 100-10 MG/5ML syrup Take 10 mLs by mouth 3 (three) times daily as needed for cough. 06/24/22  Yes Sheliah Hatch, MD  hydrochlorothiazide (HYDRODIURIL) 25 MG tablet TAKE 1 TABLET (25 MG TOTAL) BY MOUTH DAILY. 09/05/22  Yes Shade Flood, MD  ketoconazole (NIZORAL) 2 % shampoo APPLY 1 APPLICATION TOPICALLY ONCE A WEEK. 10/27/22  Yes Shade Flood, MD  levothyroxine (SYNTHROID) 150 MCG tablet TAKE 1 TABLET BY MOUTH EVERY DAY 07/30/22  Yes Shade Flood, MD  naproxen sodium (ALEVE) 220 MG tablet Take 220-440 mg by mouth 2 (two) times daily as needed (pain/soreness).   Yes [provider]  Polyethyl Glycol-Propyl Glycol (LUBRICANT EYE DROPS) 0.4-0.3 % SOLN Place 1 drop into both eyes 3 (three) times daily as needed (dry/irritated eyes.). 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  3 children, 3 grandchildren.   Patient's  Education: [employer].  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   Patient is right handed   Caffeine consumption 2-3 daily    Walks daily for roughly 30 minutes at a time.  She has not been doing it in the wintertime due to cold weather.      Social Determinants of Health   Financial Resource Strain: Low Risk  (03/20/2022)   Overall Financial Resource Strain (CARDIA)    Difficulty of Paying Living Expenses: Not hard at all  Food Insecurity: No Food Insecurity (03/20/2022)   Hunger Vital Sign    Worried About Running Out of Food in the Last Year: Never true    Ran Out of Food in the Last Year: Never true  Transportation Needs: No Transportation Needs (03/20/2022)   PRAPARE - Administrator, Civil Service (Medical): No    Lack of Transportation (Non-Medical): No  Physical Activity: Insufficiently Active (03/20/2022)   Exercise Vital Sign    Days of Exercise per Week: 3 days    Minutes of Exercise per Session: 30 min  Stress: No Stress Concern Present (03/20/2022)   [employer] of Occupational  Health - Occupational Stress Questionnaire    Feeling of Stress : Not at all  Social Connections: Moderately Isolated (03/20/2022)   Social Connection and Isolation Panel [NHANES]    Frequency of Communication with Friends and Family: Twice a week    Frequency of Social Gatherings with Friends and Family: Twice a week    Attends Religious Services: Never    Database administrator or Organizations: Yes    Attends Banker Meetings: 1 to 4 times per year    Marital Status: Divorced  Intimate Partner Violence: Not At Risk (03/20/2022)   Humiliation, Afraid, Rape, and Kick questionnaire    Fear of Current or Ex-Partner: No    Emotionally Abused: No    Physically Abused: No    Sexually Abused: No    Review of Systems  Per HPI.  Objective:   Vitals:   01/15/23 1523  BP: 118/76  Pulse: 60  Temp: 98.7 F (37.1 C)  TempSrc: Temporal  SpO2: 98%  Weight: 199 lb 9.6 oz (90.5 kg)  Height: 5\' 5"  (1.651 m)     Physical Exam Vitals reviewed.  Constitutional:      Appearance: Normal appearance. She is well-developed.  HENT:     Head: Normocephalic and atraumatic.  Eyes:     Conjunctiva/sclera: Conjunctivae normal.     Pupils: Pupils are equal, round, and reactive to light.  Neck:     Vascular: No carotid bruit.  Cardiovascular:     Rate and Rhythm: Normal rate and regular rhythm.     Heart sounds: Normal heart sounds.  Pulmonary:     Effort: Pulmonary effort is normal.     Breath sounds: Normal breath sounds.  Abdominal:     Palpations: Abdomen is soft. There is no pulsatile mass.     Tenderness: There is no abdominal tenderness.  Musculoskeletal:     Right lower leg: No edema.     Left lower leg: No edema.  Skin:    General: Skin is warm and dry.  Neurological:     Mental  Status: She is alert and oriented to person, place, and time.  Psychiatric:        Mood and Affect: Mood normal.        Behavior: Behavior normal.        Assessment & Plan:  Elizabeth Lin is a 77 y.o. female . Hyperlipidemia, unspecified hyperlipidemia type  Hypothyroidism, unspecified type  Essential hypertension  Tolerating current med regimen, check labs as ordered last visit for monitoring of her hyperlipidemia, electrolytes and thyroid.  Medication adjustments accordingly.  Plan for upcoming shoulder surgery as above.  16-month recheck.  No orders of the defined types were placed in this encounter.  Patient Instructions  Thank you for coming in today.  Good luck on your upcoming surgery.  Although they did not need labs for your surgery I do need that lab work for monitoring of your chronic medications.  Return tomorrow for lab only visit and if any changes needed based on labs I will let you know.  Follow-up in 6 months but let me know if there are questions in the meantime.  Take care!    Signed,   Meredith Staggers, MD [facility], [facility] 01/15/23 4:44 PM

## 2023-01-19 ENCOUNTER — Other Ambulatory Visit: Payer: Medicare PPO

## 2023-01-20 DIAGNOSIS — M25512 Pain in left shoulder: Secondary | ICD-10-CM | POA: Diagnosis not present

## 2023-01-20 DIAGNOSIS — Z79899 Other long term (current) drug therapy: Secondary | ICD-10-CM | POA: Diagnosis not present

## 2023-01-20 DIAGNOSIS — I10 Essential (primary) hypertension: Secondary | ICD-10-CM | POA: Diagnosis not present

## 2023-01-20 DIAGNOSIS — Z880 Allergy status to penicillin: Secondary | ICD-10-CM | POA: Diagnosis not present

## 2023-01-20 DIAGNOSIS — Z882 Allergy status to sulfonamides status: Secondary | ICD-10-CM | POA: Diagnosis not present

## 2023-01-20 DIAGNOSIS — E7800 Pure hypercholesterolemia, unspecified: Secondary | ICD-10-CM | POA: Diagnosis not present

## 2023-01-20 DIAGNOSIS — Z96612 Presence of left artificial shoulder joint: Secondary | ICD-10-CM | POA: Diagnosis not present

## 2023-01-20 DIAGNOSIS — E669 Obesity, unspecified: Secondary | ICD-10-CM | POA: Diagnosis not present

## 2023-01-20 DIAGNOSIS — E039 Hypothyroidism, unspecified: Secondary | ICD-10-CM | POA: Diagnosis not present

## 2023-01-20 DIAGNOSIS — Z79890 Hormone replacement therapy (postmenopausal): Secondary | ICD-10-CM | POA: Diagnosis not present

## 2023-01-20 DIAGNOSIS — G8929 Other chronic pain: Secondary | ICD-10-CM | POA: Diagnosis not present

## 2023-01-20 DIAGNOSIS — M19012 Primary osteoarthritis, left shoulder: Secondary | ICD-10-CM | POA: Diagnosis not present

## 2023-01-22 ENCOUNTER — Telehealth: Payer: Self-pay | Admitting: Family Medicine

## 2023-01-22 NOTE — Telephone Encounter (Signed)
Will send this to Dr. Neva Seat .

## 2023-01-22 NOTE — Telephone Encounter (Signed)
FYI Patient called stating that she had shoulder replacement on her left shoulder. Patient missed her lab visit for 01/19/23. Patient states she can't reschedule this appt right now. Patient will call back in a month to reschedule lab appt.

## 2023-01-23 NOTE — Telephone Encounter (Signed)
Informed pt of Dr Neva Seat message above pt expressed verbal understanding

## 2023-01-23 NOTE — Telephone Encounter (Signed)
Thanks for letting me know.  No problem.  She can also have this lab work performed at the [place] location as a walk-in whenever convenient for her.  Ideally in the next few weeks if possible.  Thanks.

## 2023-01-26 DIAGNOSIS — Z471 Aftercare following joint replacement surgery: Secondary | ICD-10-CM | POA: Diagnosis not present

## 2023-01-26 DIAGNOSIS — M25412 Effusion, left shoulder: Secondary | ICD-10-CM | POA: Diagnosis not present

## 2023-01-26 DIAGNOSIS — M6281 Muscle weakness (generalized): Secondary | ICD-10-CM | POA: Diagnosis not present

## 2023-01-26 DIAGNOSIS — M25512 Pain in left shoulder: Secondary | ICD-10-CM | POA: Diagnosis not present

## 2023-01-26 DIAGNOSIS — M25612 Stiffness of left shoulder, not elsewhere classified: Secondary | ICD-10-CM | POA: Diagnosis not present

## 2023-01-29 DIAGNOSIS — Z471 Aftercare following joint replacement surgery: Secondary | ICD-10-CM | POA: Diagnosis not present

## 2023-01-29 DIAGNOSIS — M25612 Stiffness of left shoulder, not elsewhere classified: Secondary | ICD-10-CM | POA: Diagnosis not present

## 2023-01-29 DIAGNOSIS — M25412 Effusion, left shoulder: Secondary | ICD-10-CM | POA: Diagnosis not present

## 2023-01-29 DIAGNOSIS — M25512 Pain in left shoulder: Secondary | ICD-10-CM | POA: Diagnosis not present

## 2023-01-29 DIAGNOSIS — M6281 Muscle weakness (generalized): Secondary | ICD-10-CM | POA: Diagnosis not present

## 2023-02-03 DIAGNOSIS — M19012 Primary osteoarthritis, left shoulder: Secondary | ICD-10-CM | POA: Diagnosis not present

## 2023-02-04 DIAGNOSIS — M25512 Pain in left shoulder: Secondary | ICD-10-CM | POA: Diagnosis not present

## 2023-02-04 DIAGNOSIS — M25412 Effusion, left shoulder: Secondary | ICD-10-CM | POA: Diagnosis not present

## 2023-02-04 DIAGNOSIS — M25612 Stiffness of left shoulder, not elsewhere classified: Secondary | ICD-10-CM | POA: Diagnosis not present

## 2023-02-04 DIAGNOSIS — M6281 Muscle weakness (generalized): Secondary | ICD-10-CM | POA: Diagnosis not present

## 2023-02-04 DIAGNOSIS — Z471 Aftercare following joint replacement surgery: Secondary | ICD-10-CM | POA: Diagnosis not present

## 2023-02-06 DIAGNOSIS — M25412 Effusion, left shoulder: Secondary | ICD-10-CM | POA: Diagnosis not present

## 2023-02-06 DIAGNOSIS — Z471 Aftercare following joint replacement surgery: Secondary | ICD-10-CM | POA: Diagnosis not present

## 2023-02-06 DIAGNOSIS — M25512 Pain in left shoulder: Secondary | ICD-10-CM | POA: Diagnosis not present

## 2023-02-06 DIAGNOSIS — M25612 Stiffness of left shoulder, not elsewhere classified: Secondary | ICD-10-CM | POA: Diagnosis not present

## 2023-02-06 DIAGNOSIS — M6281 Muscle weakness (generalized): Secondary | ICD-10-CM | POA: Diagnosis not present

## 2023-02-10 DIAGNOSIS — M6281 Muscle weakness (generalized): Secondary | ICD-10-CM | POA: Diagnosis not present

## 2023-02-10 DIAGNOSIS — M25412 Effusion, left shoulder: Secondary | ICD-10-CM | POA: Diagnosis not present

## 2023-02-10 DIAGNOSIS — M25612 Stiffness of left shoulder, not elsewhere classified: Secondary | ICD-10-CM | POA: Diagnosis not present

## 2023-02-10 DIAGNOSIS — Z471 Aftercare following joint replacement surgery: Secondary | ICD-10-CM | POA: Diagnosis not present

## 2023-02-10 DIAGNOSIS — M25512 Pain in left shoulder: Secondary | ICD-10-CM | POA: Diagnosis not present

## 2023-02-12 DIAGNOSIS — M6281 Muscle weakness (generalized): Secondary | ICD-10-CM | POA: Diagnosis not present

## 2023-02-12 DIAGNOSIS — M25612 Stiffness of left shoulder, not elsewhere classified: Secondary | ICD-10-CM | POA: Diagnosis not present

## 2023-02-12 DIAGNOSIS — M25412 Effusion, left shoulder: Secondary | ICD-10-CM | POA: Diagnosis not present

## 2023-02-12 DIAGNOSIS — M25512 Pain in left shoulder: Secondary | ICD-10-CM | POA: Diagnosis not present

## 2023-02-12 DIAGNOSIS — Z471 Aftercare following joint replacement surgery: Secondary | ICD-10-CM | POA: Diagnosis not present

## 2023-02-17 DIAGNOSIS — M25612 Stiffness of left shoulder, not elsewhere classified: Secondary | ICD-10-CM | POA: Diagnosis not present

## 2023-02-17 DIAGNOSIS — M25512 Pain in left shoulder: Secondary | ICD-10-CM | POA: Diagnosis not present

## 2023-02-17 DIAGNOSIS — M25412 Effusion, left shoulder: Secondary | ICD-10-CM | POA: Diagnosis not present

## 2023-02-17 DIAGNOSIS — Z471 Aftercare following joint replacement surgery: Secondary | ICD-10-CM | POA: Diagnosis not present

## 2023-02-17 DIAGNOSIS — M6281 Muscle weakness (generalized): Secondary | ICD-10-CM | POA: Diagnosis not present

## 2023-02-19 DIAGNOSIS — M25612 Stiffness of left shoulder, not elsewhere classified: Secondary | ICD-10-CM | POA: Diagnosis not present

## 2023-02-19 DIAGNOSIS — Z471 Aftercare following joint replacement surgery: Secondary | ICD-10-CM | POA: Diagnosis not present

## 2023-02-19 DIAGNOSIS — M25412 Effusion, left shoulder: Secondary | ICD-10-CM | POA: Diagnosis not present

## 2023-02-19 DIAGNOSIS — M6281 Muscle weakness (generalized): Secondary | ICD-10-CM | POA: Diagnosis not present

## 2023-02-19 DIAGNOSIS — M25512 Pain in left shoulder: Secondary | ICD-10-CM | POA: Diagnosis not present

## 2023-02-24 DIAGNOSIS — Z471 Aftercare following joint replacement surgery: Secondary | ICD-10-CM | POA: Diagnosis not present

## 2023-02-24 DIAGNOSIS — M6281 Muscle weakness (generalized): Secondary | ICD-10-CM | POA: Diagnosis not present

## 2023-02-24 DIAGNOSIS — M25512 Pain in left shoulder: Secondary | ICD-10-CM | POA: Diagnosis not present

## 2023-02-24 DIAGNOSIS — M25612 Stiffness of left shoulder, not elsewhere classified: Secondary | ICD-10-CM | POA: Diagnosis not present

## 2023-02-24 DIAGNOSIS — M25412 Effusion, left shoulder: Secondary | ICD-10-CM | POA: Diagnosis not present

## 2023-02-26 DIAGNOSIS — M6281 Muscle weakness (generalized): Secondary | ICD-10-CM | POA: Diagnosis not present

## 2023-02-26 DIAGNOSIS — M25412 Effusion, left shoulder: Secondary | ICD-10-CM | POA: Diagnosis not present

## 2023-02-26 DIAGNOSIS — M25512 Pain in left shoulder: Secondary | ICD-10-CM | POA: Diagnosis not present

## 2023-02-26 DIAGNOSIS — M25612 Stiffness of left shoulder, not elsewhere classified: Secondary | ICD-10-CM | POA: Diagnosis not present

## 2023-02-26 DIAGNOSIS — Z471 Aftercare following joint replacement surgery: Secondary | ICD-10-CM | POA: Diagnosis not present

## 2023-03-01 ENCOUNTER — Other Ambulatory Visit: Payer: Self-pay | Admitting: Family Medicine

## 2023-03-01 DIAGNOSIS — I10 Essential (primary) hypertension: Secondary | ICD-10-CM

## 2023-03-03 DIAGNOSIS — Z471 Aftercare following joint replacement surgery: Secondary | ICD-10-CM | POA: Diagnosis not present

## 2023-03-03 DIAGNOSIS — M25512 Pain in left shoulder: Secondary | ICD-10-CM | POA: Diagnosis not present

## 2023-03-03 DIAGNOSIS — M6281 Muscle weakness (generalized): Secondary | ICD-10-CM | POA: Diagnosis not present

## 2023-03-03 DIAGNOSIS — M25612 Stiffness of left shoulder, not elsewhere classified: Secondary | ICD-10-CM | POA: Diagnosis not present

## 2023-03-03 DIAGNOSIS — M25412 Effusion, left shoulder: Secondary | ICD-10-CM | POA: Diagnosis not present

## 2023-03-04 DIAGNOSIS — Z96612 Presence of left artificial shoulder joint: Secondary | ICD-10-CM | POA: Diagnosis not present

## 2023-03-04 DIAGNOSIS — Z4889 Encounter for other specified surgical aftercare: Secondary | ICD-10-CM | POA: Diagnosis not present

## 2023-03-05 DIAGNOSIS — M25612 Stiffness of left shoulder, not elsewhere classified: Secondary | ICD-10-CM | POA: Diagnosis not present

## 2023-03-05 DIAGNOSIS — M6281 Muscle weakness (generalized): Secondary | ICD-10-CM | POA: Diagnosis not present

## 2023-03-05 DIAGNOSIS — Z471 Aftercare following joint replacement surgery: Secondary | ICD-10-CM | POA: Diagnosis not present

## 2023-03-05 DIAGNOSIS — M25412 Effusion, left shoulder: Secondary | ICD-10-CM | POA: Diagnosis not present

## 2023-03-05 DIAGNOSIS — M25512 Pain in left shoulder: Secondary | ICD-10-CM | POA: Diagnosis not present

## 2023-03-06 DIAGNOSIS — Z96612 Presence of left artificial shoulder joint: Secondary | ICD-10-CM | POA: Insufficient documentation

## 2023-03-06 DIAGNOSIS — Z4889 Encounter for other specified surgical aftercare: Secondary | ICD-10-CM | POA: Insufficient documentation

## 2023-03-10 DIAGNOSIS — M25612 Stiffness of left shoulder, not elsewhere classified: Secondary | ICD-10-CM | POA: Diagnosis not present

## 2023-03-10 DIAGNOSIS — M6281 Muscle weakness (generalized): Secondary | ICD-10-CM | POA: Diagnosis not present

## 2023-03-10 DIAGNOSIS — M25512 Pain in left shoulder: Secondary | ICD-10-CM | POA: Diagnosis not present

## 2023-03-10 DIAGNOSIS — Z471 Aftercare following joint replacement surgery: Secondary | ICD-10-CM | POA: Diagnosis not present

## 2023-03-10 DIAGNOSIS — M25412 Effusion, left shoulder: Secondary | ICD-10-CM | POA: Diagnosis not present

## 2023-03-12 DIAGNOSIS — M25512 Pain in left shoulder: Secondary | ICD-10-CM | POA: Diagnosis not present

## 2023-03-12 DIAGNOSIS — M25412 Effusion, left shoulder: Secondary | ICD-10-CM | POA: Diagnosis not present

## 2023-03-12 DIAGNOSIS — M6281 Muscle weakness (generalized): Secondary | ICD-10-CM | POA: Diagnosis not present

## 2023-03-12 DIAGNOSIS — M25612 Stiffness of left shoulder, not elsewhere classified: Secondary | ICD-10-CM | POA: Diagnosis not present

## 2023-03-12 DIAGNOSIS — Z471 Aftercare following joint replacement surgery: Secondary | ICD-10-CM | POA: Diagnosis not present

## 2023-03-17 DIAGNOSIS — M6281 Muscle weakness (generalized): Secondary | ICD-10-CM | POA: Diagnosis not present

## 2023-03-17 DIAGNOSIS — M25512 Pain in left shoulder: Secondary | ICD-10-CM | POA: Diagnosis not present

## 2023-03-17 DIAGNOSIS — M25412 Effusion, left shoulder: Secondary | ICD-10-CM | POA: Diagnosis not present

## 2023-03-17 DIAGNOSIS — Z471 Aftercare following joint replacement surgery: Secondary | ICD-10-CM | POA: Diagnosis not present

## 2023-03-17 DIAGNOSIS — M25612 Stiffness of left shoulder, not elsewhere classified: Secondary | ICD-10-CM | POA: Diagnosis not present

## 2023-03-19 DIAGNOSIS — Z471 Aftercare following joint replacement surgery: Secondary | ICD-10-CM | POA: Diagnosis not present

## 2023-03-19 DIAGNOSIS — M25612 Stiffness of left shoulder, not elsewhere classified: Secondary | ICD-10-CM | POA: Diagnosis not present

## 2023-03-19 DIAGNOSIS — M25512 Pain in left shoulder: Secondary | ICD-10-CM | POA: Diagnosis not present

## 2023-03-19 DIAGNOSIS — M25412 Effusion, left shoulder: Secondary | ICD-10-CM | POA: Diagnosis not present

## 2023-03-19 DIAGNOSIS — M6281 Muscle weakness (generalized): Secondary | ICD-10-CM | POA: Diagnosis not present

## 2023-03-24 ENCOUNTER — Other Ambulatory Visit: Payer: Self-pay | Admitting: Family Medicine

## 2023-03-24 DIAGNOSIS — Z471 Aftercare following joint replacement surgery: Secondary | ICD-10-CM | POA: Diagnosis not present

## 2023-03-24 DIAGNOSIS — M25612 Stiffness of left shoulder, not elsewhere classified: Secondary | ICD-10-CM | POA: Diagnosis not present

## 2023-03-24 DIAGNOSIS — M6281 Muscle weakness (generalized): Secondary | ICD-10-CM | POA: Diagnosis not present

## 2023-03-24 DIAGNOSIS — M25412 Effusion, left shoulder: Secondary | ICD-10-CM | POA: Diagnosis not present

## 2023-03-24 DIAGNOSIS — F32A Depression, unspecified: Secondary | ICD-10-CM

## 2023-03-24 DIAGNOSIS — M25512 Pain in left shoulder: Secondary | ICD-10-CM | POA: Diagnosis not present

## 2023-03-26 ENCOUNTER — Ambulatory Visit (INDEPENDENT_AMBULATORY_CARE_PROVIDER_SITE_OTHER): Payer: Medicare PPO | Admitting: *Deleted

## 2023-03-26 DIAGNOSIS — Z0000 Encounter for general adult medical examination without abnormal findings: Secondary | ICD-10-CM | POA: Diagnosis not present

## 2023-03-26 DIAGNOSIS — Z780 Asymptomatic menopausal state: Secondary | ICD-10-CM | POA: Diagnosis not present

## 2023-03-26 DIAGNOSIS — Z1231 Encounter for screening mammogram for malignant neoplasm of breast: Secondary | ICD-10-CM

## 2023-03-26 NOTE — Progress Notes (Signed)
Subjective:   Elizabeth Lin is a 77 y.o. female who presents for Medicare Annual (Subsequent) preventive examination.  Visit Complete: Virtual  I connected with  Phylliss Blakes on 03/26/23 by a audio enabled telemedicine application and verified that I am speaking with the correct person using two identifiers.  Patient Location: Home  Provider Location: Home Office  I discussed the limitations of evaluation and management by telemedicine. The patient expressed understanding and agreed to proceed.   Review of Systems     Cardiac Risk Factors include: advanced age (>108men, >85 women);hypertension;family history of premature cardiovascular disease     Objective:    Today's Vitals   There is no height or weight on file to calculate BMI.     03/26/2023   11:36 AM 03/20/2022    2:43 PM 02/11/2021    3:49 PM 11/08/2019    6:39 AM 11/03/2019    8:13 AM 08/29/2019   10:49 AM  Advanced Directives  Does Patient Have a Medical Advance Directive? Yes No;Yes Yes Yes Yes No  Type of Estate agent of [employer] Power of [place];Living will Healthcare Power of [place];Living will Healthcare Power of [place];Living will Healthcare Power of [place];Living will   Does patient want to make changes to medical advance directive?    No - Patient declined    Copy of Healthcare Power of Attorney in Chart? No - copy requested No - copy requested No - copy requested No - copy requested    Would patient like information on creating a medical advance directive?      Yes (Inpatient - patient defers creating a medical advance directive at this time - Information given)    Current Medications (verified) Outpatient Encounter Medications as of 03/26/2023  Medication Sig   atorvastatin (LIPITOR) 10 MG tablet TAKE 1 TABLET BY MOUTH 3 TIMES A WEEK.   Calcium-Vitamin D-Vitamin K (CALCIUM + D + K PO) Take 1 tablet by mouth 2 (two) times daily. 1300 mg+D1000 IU   DULoxetine  (CYMBALTA) 20 MG capsule TAKE 2 CAPSULES BY MOUTH EVERY DAY   fish oil-omega-3 fatty acids 1000 MG capsule Take 1 g by mouth 2 (two) times daily.    Gluc-Chonn-MSM-Boswellia-Vit D (GLUCOSAMINE CHOND TRIPLE/VIT D) TABS Take 1 tablet by mouth 2 (two) times daily.   hydrochlorothiazide (HYDRODIURIL) 25 MG tablet TAKE 1 TABLET (25 MG TOTAL) BY MOUTH DAILY.   ketoconazole (NIZORAL) 2 % shampoo APPLY 1 APPLICATION TOPICALLY ONCE A WEEK.   levothyroxine (SYNTHROID) 150 MCG tablet TAKE 1 TABLET BY MOUTH EVERY DAY   naproxen sodium (ALEVE) 220 MG tablet Take 220-440 mg by mouth 2 (two) times daily as needed (pain/soreness).   Polyethyl Glycol-Propyl Glycol (LUBRICANT EYE DROPS) 0.4-0.3 % SOLN Place 1 drop into both eyes 3 (three) times daily as needed (dry/irritated eyes.). Rohto Ice Multi-Symptom Relief   albuterol (VENTOLIN HFA) 108 (90 Base) MCG/ACT inhaler TAKE 2 PUFFS BY MOUTH EVERY 6 HOURS AS NEEDED FOR WHEEZE OR SHORTNESS OF BREATH (Patient not taking: Reported on 03/26/2023)   guaiFENesin-codeine (ROBITUSSIN AC) 100-10 MG/5ML syrup Take 10 mLs by mouth 3 (three) times daily as needed for cough. (Patient not taking: Reported on 03/26/2023)   No facility-administered encounter medications on file as of 03/26/2023.    Allergies (verified) Penicillins, Amoxicillin-pot clavulanate, and Sulfa antibiotics   History: Past Medical History:  Diagnosis Date   Allergy    Anxiety    Arthritis    Carpal tunnel syndrome  right hand, getting injections   Cataract    Depression    Dysrhythmia    BBB   Hammer toe    Hypothyroidism due to Hashimoto's thyroiditis    Meniere disease, right    Past Surgical History:  Procedure Laterality Date   APPENDECTOMY     COLONOSCOPY     CYSTECTOMY     eye   LAPAROSCOPIC ASSISTED SPIGELIAN HERNIA REPAIR N/A 11/08/2019   Procedure: LAPAROSCOPIC ASSISTED SPIGELIAN HERNIA REPAIR WITH MESH;  Surgeon: Manus Rudd, MD;  Location: [facility];  Service: General;   Laterality: N/A;   OOPHORECTOMY Right    TONSILLECTOMY     TRANSTHORACIC ECHOCARDIOGRAM  12/09/2019   EF 60 to 65%.  No R WMA.  GR 1 DD.  Normal RV function.  Aortic calcification with sclerosis but no suggestion of stenosis.  (Mean gradient 8 mmHg)   Family History  Problem Relation Age of Onset   Diabetes Father    Myasthenia gravis Father        Died from complications in his 72s   CAD Father        Was too sick with myasthenia gravis to have CABG   Hypertension Father    Thyroid disease Mother    Cancer Mother        Lung -died at age 77-1/2.   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  3 children, 3 grandchildren.   Patient's  Education: [employer].  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   Patient is right handed   Caffeine consumption 2-3 daily    Walks daily for roughly 30 minutes at a time.  She  has not been doing it in the wintertime due to cold weather.      Social Determinants of Health   Financial Resource Strain: Low Risk  (03/26/2023)   Overall Financial Resource Strain (CARDIA)    Difficulty of Paying Living Expenses: Not hard at all  Food Insecurity: No Food Insecurity (03/26/2023)   Hunger Vital Sign    Worried About Running Out of Food in the Last Year: Never true    Ran Out of Food in the Last Year: Never true  Transportation Needs: No Transportation  Needs (03/26/2023)   PRAPARE - Administrator, Civil Service (Medical): No    Lack of Transportation (Non-Medical): No  Physical Activity: Insufficiently Active (03/26/2023)   Exercise Vital Sign    Days of Exercise per Week: 2 days    Minutes of Exercise per Session: 30 min  Stress: No Stress Concern Present (03/26/2023)   [employer] of Occupational Health - Occupational Stress Questionnaire    Feeling of Stress : Not at all  Social Connections: Socially Isolated (03/26/2023)   Social Connection and Isolation Panel [NHANES]    Frequency of Communication with Friends and Family: Three times a week    Frequency of Social Gatherings with Friends and Family: Three times a week    Attends Religious Services: Never    Active Member of Clubs or Organizations: No    Attends Engineer, structural: Never    Marital Status: Divorced    Tobacco Counseling Counseling given: Not Answered   Clinical Intake:  Pre-visit preparation completed: Yes  Pain : No/denies pain     Diabetes: No  How often do you need to have someone help you when you read instructions, pamphlets, or other written materials from your doctor or pharmacy?: 1 - Never  Interpreter Needed?: No  Information entered by :: Remi Haggard LPN   Activities of Daily Living    03/26/2023   11:40 AM  In your present state of health, do you have any difficulty performing the following activities:  Hearing? 1  Difficulty  concentrating or making decisions? 0  Walking or climbing stairs? 0  Dressing or bathing? 0  Doing errands, shopping? 0  Preparing Food and eating ? N  Using the Toilet? N  In the past six months, have you accidently leaked urine? N  Managing your Medications? N  Managing your Finances? N  Housekeeping or managing your Housekeeping? N    Patient Care Team: Shade Flood, MD as PCP - General (Family Medicine) Marykay Lex, MD as PCP - Cardiology (Cardiology) Candice Camp, MD as Consulting Physician (Obstetrics and Gynecology)  Indicate any recent Medical Services you may have received from other than [facility] providers in the past year (date may be approximate).     Assessment:   This is a routine wellness examination for [place].  Hearing/Vision screen Hearing Screening - Comments:: Bilateral hearing aids Vision Screening - Comments:: Up to date Cataracts removed in both eyes [facility]  Dietary issues and exercise activities discussed:     Goals Addressed             This Visit's Progress    Patient Stated       Stay active        Depression Screen    03/26/2023   11:45 AM 01/15/2023    3:25 PM 12/31/2022    4:25 PM 06/20/2022   10:34 AM 04/17/2022    8:19 AM 03/20/2022    2:44 PM 03/20/2022    2:42 PM  PHQ 2/9 Scores  PHQ - 2 Score 0 1 0 0 4 0 0  PHQ- 9 Score 0 8 6 0 14      Fall Risk    03/26/2023   11:38 AM 01/15/2023    3:25 PM 12/31/2022    4:25 PM 06/20/2022   10:35 AM 04/17/2022    8:19 AM  Fall Risk   Falls in the past year? 0 0 0 0 0  Number falls in past yr: 0  0  0  Injury with Fall? 0  0  0  Risk for fall due to :  No Fall Risks No Fall Risks No Fall Risks No Fall Risks  Follow up Falls evaluation completed;Education provided;Falls prevention discussed   Falls evaluation completed Falls evaluation completed    MEDICARE RISK AT HOME:  Medicare Risk at Home - 03/26/23 1138     Any stairs in or around the home? Yes    If so, are there any  without handrails? No    Home free of loose throw rugs in walkways, pet beds, electrical cords, etc? Yes    Adequate lighting in your home to reduce risk of falls? Yes    Life alert? No    Use of a cane, walker or w/c? No    Grab bars in the bathroom? No    Shower chair or bench in shower? No    Elevated toilet seat or a handicapped toilet? No             TIMED UP AND GO:  Was the test performed?  No    Cognitive Function:        03/26/2023   11:41 AM 08/29/2019   10:50 AM  6CIT Screen  What Year? 0 points 0 points  What month? 0 points 0 points  What time? 0 points 0 points  Count back from 20 0 points 0 points  Months in reverse 0 points 0 points  Repeat phrase 4 points 6 points  Total Score 4 points 6 points    Immunizations Immunization History  Administered Date(s) Administered   Fluad Quad(high Dose 65+) 07/22/2021   Influenza, High Dose Seasonal PF 08/06/2022   Influenza, Seasonal, Injecte, Preservative Fre 11/05/2012   Influenza,inj,Quad PF,6+ Mos 06/21/2013, 09/01/2014   Influenza-Unspecified 06/21/2016, 06/15/2017   PFIZER Comirnaty(Gray Top)Covid-19 Tri-Sucrose Vaccine 05/13/2020, 06/03/2020   PFIZER(Purple Top)SARS-COV-2 Vaccination 05/30/2020, 06/03/2020   Pneumococcal Conjugate-13 09/04/2016   Tdap 03/16/2012    TDAP status: Due, Education has been provided regarding the importance of this vaccine. Advised may receive this vaccine at local pharmacy or Health Dept. Aware to provide a copy of the vaccination record if obtained from local pharmacy or Health Dept. Verbalized acceptance and understanding.  Flu Vaccine status: Up to date  Pneumococcal vaccine status: Due, Education has been provided regarding the importance of this vaccine. Advised may receive this vaccine at local pharmacy or Health Dept. Aware to provide a copy of the vaccination record if obtained from local pharmacy or Health Dept. Verbalized acceptance and understanding.  Covid-19  vaccine status: Information provided on how to obtain vaccines.   Qualifies for Shingles Vaccine? Yes   Zostavax completed No   Shingrix Completed?: No.    Education has been provided regarding the importance of this vaccine. Patient has been advised to call insurance company to determine out of pocket expense if they have not yet received this vaccine. Advised may also receive vaccine at local pharmacy or Health Dept. Verbalized acceptance and understanding.  Screening Tests Health Maintenance  Topic Date Due   Pneumonia Vaccine 3+ Years old (2 of 2 - PPSV23 or PCV20) 09/04/2017   DTaP/Tdap/Td (2 - Td or Tdap) 03/16/2022   INFLUENZA VACCINE  04/30/2023   Medicare Annual Wellness (AWV)  03/25/2024   DEXA SCAN  Completed   Hepatitis C Screening  Completed   HPV VACCINES  Aged Out   Colonoscopy  Discontinued   COVID-19 Vaccine  Discontinued   Zoster Vaccines- Shingrix  Discontinued  Health Maintenance  Health Maintenance Due  Topic Date Due   Pneumonia Vaccine 13+ Years old (2 of 2 - PPSV23 or PCV20) 09/04/2017   DTaP/Tdap/Td (2 - Td or Tdap) 03/16/2022    Colorectal cancer screening: No longer required.   Mammogram status: Ordered  . Pt provided with contact info and advised to call to schedule appt.   Bone Density status: Ordered  . Pt provided with contact info and advised to call to schedule appt.  Lung Cancer Screening: (Low Dose CT Chest recommended if Age 9-80 years, 20 pack-year currently smoking OR have quit w/in 15years.) does not qualify.   Lung Cancer Screening Referral:   Additional Screening:  Hepatitis C Screening: does not qualify; Completed 2017  Vision Screening: Recommended annual ophthalmology exams for early detection of glaucoma and other disorders of the eye. Is the patient up to date with their annual eye exam?  Yes  Who is the provider or what is the name of the office in which the patient attends annual eye exams? [facility] If pt is not  established with a provider, would they like to be referred to a provider to establish care? No .   Dental Screening: Recommended annual dental exams for proper oral hygiene    Community Resource Referral / Chronic Care Management: CRR required this visit?  No   CCM required this visit?  No     Plan:     I have personally reviewed and noted the following in the patient's chart:   Medical and social history Use of alcohol, tobacco or illicit drugs  Current medications and supplements including opioid prescriptions. Patient is not currently taking opioid prescriptions. Functional ability and status Nutritional status Physical activity Advanced directives List of other physicians Hospitalizations, surgeries, and ER visits in previous 12 months Vitals Screenings to include cognitive, depression, and falls Referrals and appointments  In addition, I have reviewed and discussed with patient certain preventive protocols, quality metrics, and best practice recommendations. A written personalized care plan for preventive services as well as general preventive health recommendations were provided to patient.     Remi Haggard, LPN   [id number]/84/[id number]   After Visit Summary: (MyChart) Due to this being a telephonic visit, the after visit summary with patients personalized plan was offered to patient via MyChart   Nurse Notes:

## 2023-03-26 NOTE — Patient Instructions (Signed)
Elizabeth Lin , Thank you for taking time to come for your Medicare Wellness Visit. I appreciate your ongoing commitment to your health goals. Please review the following plan we discussed and let me know if I can assist you in the future.   Screening recommendations/referrals: Colonoscopy: no longer required Mammogram: Education provided Bone Density: Education provided Recommended yearly ophthalmology/optometry visit for glaucoma screening and checkup Recommended yearly dental visit for hygiene and checkup  Vaccinations: Influenza vaccine: up to date Pneumococcal vaccine: Education provided Tdap vaccine: Education provided Shingles vaccine: Education provided    Advanced directives: yes not on file      Preventive Care 65 Years and Older, Female Preventive care refers to lifestyle choices and visits with your health care provider that can promote health and wellness. What does preventive care include? A yearly physical exam. This is also called an annual well check. Dental exams once or twice a year. Routine eye exams. Ask your health care provider how often you should have your eyes checked. Personal lifestyle choices, including: Daily care of your teeth and gums. Regular physical activity. Eating a healthy diet. Avoiding tobacco and drug use. Limiting alcohol use. Practicing safe sex. Taking low-dose aspirin every day. Taking vitamin and mineral supplements as recommended by your health care provider. What happens during an annual well check? The services and screenings done by your health care provider during your annual well check will depend on your age, overall health, lifestyle risk factors, and family history of disease. Counseling  Your health care provider may ask you questions about your: Alcohol use. Tobacco use. Drug use. Emotional well-being. Home and relationship well-being. Sexual activity. Eating habits. History of falls. Memory and ability to  understand (cognition). Work and work Astronomer. Reproductive health. Screening  You may have the following tests or measurements: Height, weight, and BMI. Blood pressure. Lipid and cholesterol levels. These may be checked every 5 years, or more frequently if you are over 42 years old. Skin check. Lung cancer screening. You may have this screening every year starting at age 28 if you have a 30-pack-year history of smoking and currently smoke or have quit within the past 15 years. Fecal occult blood test (FOBT) of the stool. You may have this test every year starting at age 67. Flexible sigmoidoscopy or colonoscopy. You may have a sigmoidoscopy every 5 years or a colonoscopy every 10 years starting at age 19. Hepatitis C blood test. Hepatitis B blood test. Sexually transmitted disease (STD) testing. Diabetes screening. This is done by checking your blood sugar (glucose) after you have not eaten for a while (fasting). You may have this done every 1-3 years. Bone density scan. This is done to screen for osteoporosis. You may have this done starting at age 55. Mammogram. This may be done every 1-2 years. Talk to your health care provider about how often you should have regular mammograms. Talk with your health care provider about your test results, treatment options, and if necessary, the need for more tests. Vaccines  Your health care provider may recommend certain vaccines, such as: Influenza vaccine. This is recommended every year. Tetanus, diphtheria, and acellular pertussis (Tdap, Td) vaccine. You may need a Td booster every 10 years. Zoster vaccine. You may need this after age 19. Pneumococcal 13-valent conjugate (PCV13) vaccine. One dose is recommended after age 61. Pneumococcal polysaccharide (PPSV23) vaccine. One dose is recommended after age 75. Talk to your health care provider about which screenings and vaccines you need and how often  you need them. This information is not  intended to replace advice given to you by your health care provider. Make sure you discuss any questions you have with your health care provider. Document Released: 10/12/2015 Document Revised: 06/04/2016 Document Reviewed: 07/17/2015 Elsevier Interactive Patient Education  2017 [place] Prevention in the Home Falls can cause injuries. They can happen to people of all ages. There are many things you can do to make your home safe and to help prevent falls. What can I do on the outside of my home? Regularly fix the edges of walkways and driveways and fix any cracks. Remove anything that might make you trip as you walk through a door, such as a raised step or threshold. Trim any bushes or trees on the path to your home. Use bright outdoor lighting. Clear any walking paths of anything that might make someone trip, such as rocks or tools. Regularly check to see if handrails are loose or broken. Make sure that both sides of any steps have handrails. Any raised decks and porches should have guardrails on the edges. Have any leaves, snow, or ice cleared regularly. Use sand or salt on walking paths during winter. Clean up any spills in your garage right away. This includes oil or grease spills. What can I do in the bathroom? Use night lights. Install grab bars by the toilet and in the tub and shower. Do not use towel bars as grab bars. Use non-skid mats or decals in the tub or shower. If you need to sit down in the shower, use a plastic, non-slip stool. Keep the floor dry. Clean up any water that spills on the floor as soon as it happens. Remove soap buildup in the tub or shower regularly. Attach bath mats securely with double-sided non-slip rug tape. Do not have throw rugs and other things on the floor that can make you trip. What can I do in the bedroom? Use night lights. Make sure that you have a light by your bed that is easy to reach. Do not use any sheets or blankets that are  too big for your bed. They should not hang down onto the floor. Have a firm chair that has side arms. You can use this for support while you get dressed. Do not have throw rugs and other things on the floor that can make you trip. What can I do in the kitchen? Clean up any spills right away. Avoid walking on wet floors. Keep items that you use a lot in easy-to-reach places. If you need to reach something above you, use a strong step stool that has a grab bar. Keep electrical cords out of the way. Do not use floor polish or wax that makes floors slippery. If you must use wax, use non-skid floor wax. Do not have throw rugs and other things on the floor that can make you trip. What can I do with my stairs? Do not leave any items on the stairs. Make sure that there are handrails on both sides of the stairs and use them. Fix handrails that are broken or loose. Make sure that handrails are as long as the stairways. Check any carpeting to make sure that it is firmly attached to the stairs. Fix any carpet that is loose or worn. Avoid having throw rugs at the top or bottom of the stairs. If you do have throw rugs, attach them to the floor with carpet tape. Make sure that you have a light  switch at the top of the stairs and the bottom of the stairs. If you do not have them, ask someone to add them for you. What else can I do to help prevent falls? Wear shoes that: Do not have high heels. Have rubber bottoms. Are comfortable and fit you well. Are closed at the toe. Do not wear sandals. If you use a stepladder: Make sure that it is fully opened. Do not climb a closed stepladder. Make sure that both sides of the stepladder are locked into place. Ask someone to hold it for you, if possible. Clearly mark and make sure that you can see: Any grab bars or handrails. First and last steps. Where the edge of each step is. Use tools that help you move around (mobility aids) if they are needed. These  include: Canes. Walkers. Scooters. Crutches. Turn on the lights when you go into a dark area. Replace any light bulbs as soon as they burn out. Set up your furniture so you have a clear path. Avoid moving your furniture around. If any of your floors are uneven, fix them. If there are any pets around you, be aware of where they are. Review your medicines with your doctor. Some medicines can make you feel dizzy. This can increase your chance of falling. Ask your doctor what other things that you can do to help prevent falls. This information is not intended to replace advice given to you by your health care provider. Make sure you discuss any questions you have with your health care provider. Document Released: 07/12/2009 Document Revised: 02/21/2016 Document Reviewed: 10/20/2014 Elsevier Interactive Patient Education  2017 [employer].

## 2023-03-29 DIAGNOSIS — M25412 Effusion, left shoulder: Secondary | ICD-10-CM | POA: Diagnosis not present

## 2023-03-29 DIAGNOSIS — M25612 Stiffness of left shoulder, not elsewhere classified: Secondary | ICD-10-CM | POA: Diagnosis not present

## 2023-03-29 DIAGNOSIS — M6281 Muscle weakness (generalized): Secondary | ICD-10-CM | POA: Diagnosis not present

## 2023-03-29 DIAGNOSIS — M25512 Pain in left shoulder: Secondary | ICD-10-CM | POA: Diagnosis not present

## 2023-03-29 DIAGNOSIS — Z471 Aftercare following joint replacement surgery: Secondary | ICD-10-CM | POA: Diagnosis not present

## 2023-04-01 ENCOUNTER — Ambulatory Visit
Admission: RE | Admit: 2023-04-01 | Discharge: 2023-04-01 | Disposition: A | Discharge status: Home / Self Care | Payer: Medicare PPO | Source: Ambulatory Visit | Attending: Family Medicine | Admitting: Family Medicine

## 2023-04-01 DIAGNOSIS — Z1231 Encounter for screening mammogram for malignant neoplasm of breast: Secondary | ICD-10-CM | POA: Diagnosis not present

## 2023-04-03 DIAGNOSIS — M25412 Effusion, left shoulder: Secondary | ICD-10-CM | POA: Diagnosis not present

## 2023-04-03 DIAGNOSIS — Z471 Aftercare following joint replacement surgery: Secondary | ICD-10-CM | POA: Diagnosis not present

## 2023-04-03 DIAGNOSIS — M25612 Stiffness of left shoulder, not elsewhere classified: Secondary | ICD-10-CM | POA: Diagnosis not present

## 2023-04-03 DIAGNOSIS — M25512 Pain in left shoulder: Secondary | ICD-10-CM | POA: Diagnosis not present

## 2023-04-03 DIAGNOSIS — M6281 Muscle weakness (generalized): Secondary | ICD-10-CM | POA: Diagnosis not present

## 2023-04-06 ENCOUNTER — Telehealth: Payer: Self-pay

## 2023-04-06 NOTE — Telephone Encounter (Signed)
Left VM with pt results sent my chart message as well

## 2023-04-06 NOTE — Telephone Encounter (Signed)
-----   Message from Alveria Apley, NP sent at 04/03/2023  2:54 PM EDT ----- I am covering for Dr. Neva Seat while he is out of office. No suspicious findings for malignancy. Re screen mammogram in 1 year.

## 2023-04-07 DIAGNOSIS — M6281 Muscle weakness (generalized): Secondary | ICD-10-CM | POA: Diagnosis not present

## 2023-04-07 DIAGNOSIS — M25512 Pain in left shoulder: Secondary | ICD-10-CM | POA: Diagnosis not present

## 2023-04-07 DIAGNOSIS — M25612 Stiffness of left shoulder, not elsewhere classified: Secondary | ICD-10-CM | POA: Diagnosis not present

## 2023-04-07 DIAGNOSIS — Z471 Aftercare following joint replacement surgery: Secondary | ICD-10-CM | POA: Diagnosis not present

## 2023-04-07 DIAGNOSIS — M25412 Effusion, left shoulder: Secondary | ICD-10-CM | POA: Diagnosis not present

## 2023-04-09 DIAGNOSIS — M6281 Muscle weakness (generalized): Secondary | ICD-10-CM | POA: Diagnosis not present

## 2023-04-09 DIAGNOSIS — M25412 Effusion, left shoulder: Secondary | ICD-10-CM | POA: Diagnosis not present

## 2023-04-09 DIAGNOSIS — M25612 Stiffness of left shoulder, not elsewhere classified: Secondary | ICD-10-CM | POA: Diagnosis not present

## 2023-04-09 DIAGNOSIS — M25512 Pain in left shoulder: Secondary | ICD-10-CM | POA: Diagnosis not present

## 2023-04-09 DIAGNOSIS — Z471 Aftercare following joint replacement surgery: Secondary | ICD-10-CM | POA: Diagnosis not present

## 2023-04-14 DIAGNOSIS — M25612 Stiffness of left shoulder, not elsewhere classified: Secondary | ICD-10-CM | POA: Diagnosis not present

## 2023-04-14 DIAGNOSIS — M25412 Effusion, left shoulder: Secondary | ICD-10-CM | POA: Diagnosis not present

## 2023-04-14 DIAGNOSIS — M6281 Muscle weakness (generalized): Secondary | ICD-10-CM | POA: Diagnosis not present

## 2023-04-14 DIAGNOSIS — Z471 Aftercare following joint replacement surgery: Secondary | ICD-10-CM | POA: Diagnosis not present

## 2023-04-14 DIAGNOSIS — M25512 Pain in left shoulder: Secondary | ICD-10-CM | POA: Diagnosis not present

## 2023-04-15 DIAGNOSIS — Z4889 Encounter for other specified surgical aftercare: Secondary | ICD-10-CM | POA: Diagnosis not present

## 2023-04-15 DIAGNOSIS — Z96612 Presence of left artificial shoulder joint: Secondary | ICD-10-CM | POA: Diagnosis not present

## 2023-04-16 DIAGNOSIS — Z471 Aftercare following joint replacement surgery: Secondary | ICD-10-CM | POA: Diagnosis not present

## 2023-04-16 DIAGNOSIS — M25412 Effusion, left shoulder: Secondary | ICD-10-CM | POA: Diagnosis not present

## 2023-04-16 DIAGNOSIS — M25512 Pain in left shoulder: Secondary | ICD-10-CM | POA: Diagnosis not present

## 2023-04-16 DIAGNOSIS — M25612 Stiffness of left shoulder, not elsewhere classified: Secondary | ICD-10-CM | POA: Diagnosis not present

## 2023-04-16 DIAGNOSIS — M6281 Muscle weakness (generalized): Secondary | ICD-10-CM | POA: Diagnosis not present

## 2023-04-23 DIAGNOSIS — M25412 Effusion, left shoulder: Secondary | ICD-10-CM | POA: Diagnosis not present

## 2023-04-23 DIAGNOSIS — M25612 Stiffness of left shoulder, not elsewhere classified: Secondary | ICD-10-CM | POA: Diagnosis not present

## 2023-04-23 DIAGNOSIS — M25512 Pain in left shoulder: Secondary | ICD-10-CM | POA: Diagnosis not present

## 2023-04-23 DIAGNOSIS — Z471 Aftercare following joint replacement surgery: Secondary | ICD-10-CM | POA: Diagnosis not present

## 2023-04-23 DIAGNOSIS — M6281 Muscle weakness (generalized): Secondary | ICD-10-CM | POA: Diagnosis not present

## 2023-04-28 DIAGNOSIS — M6281 Muscle weakness (generalized): Secondary | ICD-10-CM | POA: Diagnosis not present

## 2023-04-28 DIAGNOSIS — Z471 Aftercare following joint replacement surgery: Secondary | ICD-10-CM | POA: Diagnosis not present

## 2023-04-28 DIAGNOSIS — M25612 Stiffness of left shoulder, not elsewhere classified: Secondary | ICD-10-CM | POA: Diagnosis not present

## 2023-04-28 DIAGNOSIS — M25512 Pain in left shoulder: Secondary | ICD-10-CM | POA: Diagnosis not present

## 2023-04-28 DIAGNOSIS — M25412 Effusion, left shoulder: Secondary | ICD-10-CM | POA: Diagnosis not present

## 2023-04-30 DIAGNOSIS — M25512 Pain in left shoulder: Secondary | ICD-10-CM | POA: Diagnosis not present

## 2023-04-30 DIAGNOSIS — M25612 Stiffness of left shoulder, not elsewhere classified: Secondary | ICD-10-CM | POA: Diagnosis not present

## 2023-04-30 DIAGNOSIS — Z471 Aftercare following joint replacement surgery: Secondary | ICD-10-CM | POA: Diagnosis not present

## 2023-04-30 DIAGNOSIS — M25412 Effusion, left shoulder: Secondary | ICD-10-CM | POA: Diagnosis not present

## 2023-04-30 DIAGNOSIS — M6281 Muscle weakness (generalized): Secondary | ICD-10-CM | POA: Diagnosis not present

## 2023-05-05 DIAGNOSIS — M6281 Muscle weakness (generalized): Secondary | ICD-10-CM | POA: Diagnosis not present

## 2023-05-05 DIAGNOSIS — Z471 Aftercare following joint replacement surgery: Secondary | ICD-10-CM | POA: Diagnosis not present

## 2023-05-05 DIAGNOSIS — M25512 Pain in left shoulder: Secondary | ICD-10-CM | POA: Diagnosis not present

## 2023-05-05 DIAGNOSIS — M25612 Stiffness of left shoulder, not elsewhere classified: Secondary | ICD-10-CM | POA: Diagnosis not present

## 2023-05-05 DIAGNOSIS — M25412 Effusion, left shoulder: Secondary | ICD-10-CM | POA: Diagnosis not present

## 2023-05-07 DIAGNOSIS — M6281 Muscle weakness (generalized): Secondary | ICD-10-CM | POA: Diagnosis not present

## 2023-05-07 DIAGNOSIS — Z471 Aftercare following joint replacement surgery: Secondary | ICD-10-CM | POA: Diagnosis not present

## 2023-05-07 DIAGNOSIS — M25512 Pain in left shoulder: Secondary | ICD-10-CM | POA: Diagnosis not present

## 2023-05-07 DIAGNOSIS — M25612 Stiffness of left shoulder, not elsewhere classified: Secondary | ICD-10-CM | POA: Diagnosis not present

## 2023-05-07 DIAGNOSIS — M25412 Effusion, left shoulder: Secondary | ICD-10-CM | POA: Diagnosis not present

## 2023-05-12 DIAGNOSIS — M25612 Stiffness of left shoulder, not elsewhere classified: Secondary | ICD-10-CM | POA: Diagnosis not present

## 2023-05-12 DIAGNOSIS — Z471 Aftercare following joint replacement surgery: Secondary | ICD-10-CM | POA: Diagnosis not present

## 2023-05-12 DIAGNOSIS — M25512 Pain in left shoulder: Secondary | ICD-10-CM | POA: Diagnosis not present

## 2023-05-12 DIAGNOSIS — M6281 Muscle weakness (generalized): Secondary | ICD-10-CM | POA: Diagnosis not present

## 2023-05-12 DIAGNOSIS — M25412 Effusion, left shoulder: Secondary | ICD-10-CM | POA: Diagnosis not present

## 2023-05-14 DIAGNOSIS — Z471 Aftercare following joint replacement surgery: Secondary | ICD-10-CM | POA: Diagnosis not present

## 2023-05-14 DIAGNOSIS — M25512 Pain in left shoulder: Secondary | ICD-10-CM | POA: Diagnosis not present

## 2023-05-14 DIAGNOSIS — M25612 Stiffness of left shoulder, not elsewhere classified: Secondary | ICD-10-CM | POA: Diagnosis not present

## 2023-05-14 DIAGNOSIS — M6281 Muscle weakness (generalized): Secondary | ICD-10-CM | POA: Diagnosis not present

## 2023-05-14 DIAGNOSIS — M25412 Effusion, left shoulder: Secondary | ICD-10-CM | POA: Diagnosis not present

## 2023-05-19 DIAGNOSIS — Z471 Aftercare following joint replacement surgery: Secondary | ICD-10-CM | POA: Diagnosis not present

## 2023-05-19 DIAGNOSIS — M25412 Effusion, left shoulder: Secondary | ICD-10-CM | POA: Diagnosis not present

## 2023-05-19 DIAGNOSIS — M25612 Stiffness of left shoulder, not elsewhere classified: Secondary | ICD-10-CM | POA: Diagnosis not present

## 2023-05-19 DIAGNOSIS — M6281 Muscle weakness (generalized): Secondary | ICD-10-CM | POA: Diagnosis not present

## 2023-05-19 DIAGNOSIS — M25512 Pain in left shoulder: Secondary | ICD-10-CM | POA: Diagnosis not present

## 2023-05-21 DIAGNOSIS — M25612 Stiffness of left shoulder, not elsewhere classified: Secondary | ICD-10-CM | POA: Diagnosis not present

## 2023-05-21 DIAGNOSIS — M6281 Muscle weakness (generalized): Secondary | ICD-10-CM | POA: Diagnosis not present

## 2023-05-21 DIAGNOSIS — M25512 Pain in left shoulder: Secondary | ICD-10-CM | POA: Diagnosis not present

## 2023-05-21 DIAGNOSIS — Z471 Aftercare following joint replacement surgery: Secondary | ICD-10-CM | POA: Diagnosis not present

## 2023-05-21 DIAGNOSIS — M25412 Effusion, left shoulder: Secondary | ICD-10-CM | POA: Diagnosis not present

## 2023-05-28 DIAGNOSIS — I10 Essential (primary) hypertension: Secondary | ICD-10-CM | POA: Diagnosis not present

## 2023-05-28 DIAGNOSIS — E039 Hypothyroidism, unspecified: Secondary | ICD-10-CM | POA: Diagnosis not present

## 2023-05-28 DIAGNOSIS — F329 Major depressive disorder, single episode, unspecified: Secondary | ICD-10-CM | POA: Diagnosis not present

## 2023-05-29 DIAGNOSIS — M25612 Stiffness of left shoulder, not elsewhere classified: Secondary | ICD-10-CM | POA: Diagnosis not present

## 2023-05-29 DIAGNOSIS — Z471 Aftercare following joint replacement surgery: Secondary | ICD-10-CM | POA: Diagnosis not present

## 2023-05-29 DIAGNOSIS — M25512 Pain in left shoulder: Secondary | ICD-10-CM | POA: Diagnosis not present

## 2023-05-29 DIAGNOSIS — M25412 Effusion, left shoulder: Secondary | ICD-10-CM | POA: Diagnosis not present

## 2023-05-29 DIAGNOSIS — M6281 Muscle weakness (generalized): Secondary | ICD-10-CM | POA: Diagnosis not present

## 2023-06-10 ENCOUNTER — Ambulatory Visit: Payer: Medicare PPO | Admitting: Podiatry

## 2023-06-10 DIAGNOSIS — M19071 Primary osteoarthritis, right ankle and foot: Secondary | ICD-10-CM

## 2023-06-10 NOTE — Progress Notes (Signed)
Subjective:  Patient ID: Elizabeth Lin, female    DOB: Nov 07, 1945,  MRN: [id number]  Chief Complaint  Patient presents with   Foot Pain    Right foot pain on top of foot pt stated she only feels the discomfort when she applies pressure  She stated that she does have arthritis in her feet     77 y.o. female presents with the above complaint.  Patient presents with right dorsal midfoot pain painful to touch is progressive gotten worse worse with ambulation worse with pressure she would like to discuss treatment options for this.  She does have arthritis in her feet.  She has not seen it was prior to seeing me for this.  Pain scale is 5 out of 10 dull aching nature.   Review of Systems: Negative except as noted in the HPI. Denies N/V/F/Ch.  Past Medical History:  Diagnosis Date   Allergy    Anxiety    Arthritis    Carpal tunnel syndrome    right hand, getting injections   Cataract    Depression    Dysrhythmia    BBB   Hammer toe    Hypothyroidism due to Hashimoto's thyroiditis    Meniere disease, right     Current Outpatient Medications:    aspirin EC 81 MG tablet, Take by mouth., Disp: , Rfl:    ondansetron (ZOFRAN) 4 MG tablet, Take 4 mg by mouth every 8 (eight) hours as needed., Disp: , Rfl:    oxyCODONE (OXY IR/ROXICODONE) 5 MG immediate release tablet, Take by mouth., Disp: , Rfl:    senna (SENOKOT) 8.6 MG tablet, Take 1 tablet by mouth daily., Disp: , Rfl:    acetaminophen (TYLENOL) 500 MG tablet, Take by mouth., Disp: , Rfl:    albuterol (VENTOLIN HFA) 108 (90 Base) MCG/ACT inhaler, TAKE 2 PUFFS BY MOUTH EVERY 6 HOURS AS NEEDED FOR WHEEZE OR SHORTNESS OF BREATH (Patient not taking: Reported on 03/26/2023), Disp: 18 each, Rfl: 3   ascorbic acid (VITAMIN C) 1000 MG tablet, Take by mouth., Disp: , Rfl:    atorvastatin (LIPITOR) 10 MG tablet, TAKE 1 TABLET BY MOUTH 3 TIMES A WEEK., Disp: 36 tablet, Rfl: 2   Calcium Carb-Cholecalciferol 500-10 MG-MCG TABS, Take by mouth.,  Disp: , Rfl:    Calcium-Vitamin D-Vitamin K (CALCIUM + D + K PO), Take 1 tablet by mouth 2 (two) times daily. 1300 mg+D1000 IU, Disp: , Rfl:    DULoxetine (CYMBALTA) 20 MG capsule, TAKE 2 CAPSULES BY MOUTH EVERY DAY, Disp: 180 capsule, Rfl: 1   fish oil-omega-3 fatty acids 1000 MG capsule, Take 1 g by mouth 2 (two) times daily. , Disp: , Rfl:    Gluc-Chonn-MSM-Boswellia-Vit D (GLUCOSAMINE CHOND TRIPLE/VIT D) TABS, Take 1 tablet by mouth 2 (two) times daily., Disp: , Rfl:    guaiFENesin-codeine (ROBITUSSIN AC) 100-10 MG/5ML syrup, Take 10 mLs by mouth 3 (three) times daily as needed for cough. (Patient not taking: Reported on 03/26/2023), Disp: 120 mL, Rfl: 0   hydrochlorothiazide (HYDRODIURIL) 25 MG tablet, TAKE 1 TABLET (25 MG TOTAL) BY MOUTH DAILY., Disp: 90 tablet, Rfl: 1   ibuprofen (ADVIL) 200 MG tablet, Take by mouth., Disp: , Rfl:    ketoconazole (NIZORAL) 2 % shampoo, APPLY 1 APPLICATION TOPICALLY ONCE A WEEK., Disp: 120 mL, Rfl: 0   levothyroxine (SYNTHROID) 150 MCG tablet, TAKE 1 TABLET BY MOUTH EVERY DAY, Disp: 90 tablet, Rfl: 3   naproxen sodium (ALEVE) 220 MG tablet, Take 220-440 mg by  mouth 2 (two) times daily as needed (pain/soreness)., Disp: , Rfl:    ofloxacin (OCUFLOX) 0.3 % ophthalmic solution, Apply to eye., Disp: , Rfl:    Polyethyl Glycol-Propyl Glycol (LUBRICANT EYE DROPS) 0.4-0.3 % SOLN, Place 1 drop into both eyes 3 (three) times daily as needed (dry/irritated eyes.). Rohto Ice Multi-Symptom Relief, Disp: , Rfl:    Vitamin E (VITAMIN E/D-ALPHA NATURAL) 268 MG (400 UNIT) CAPS, Take by mouth., Disp: , Rfl:   Social History   Tobacco Use  Smoking Status Former   Current packs/day: 0.00   Types: Cigarettes   Quit date: 11/01/1974   Years since quitting: 48.6  Smokeless Tobacco Never    Allergies  Allergen Reactions   Penicillins Rash    Did it involve swelling of the face/tongue/throat, SOB, or low BP? No Did it involve sudden or severe rash/hives, skin peeling, or any  reaction on the inside of your mouth or nose? Yes Did you need to seek medical attention at a hospital or doctor's office? No When did it last happen?  ~6-10 years ago     If all above answers are "NO", may proceed with cephalosporin use.    Amoxicillin-Pot Clavulanate     Red rash Did it involve swelling of the face/tongue/throat, SOB, or low BP? No Did it involve sudden or severe rash/hives, skin peeling, or any reaction on the inside of your mouth or nose? Yes Did you need to seek medical attention at a hospital or doctor's office? No When did it last happen? ~6-10 years ago    If all above answers are "NO", may proceed with cephalosporin use.    Sulfa Antibiotics    Objective:  There were no vitals filed for this visit. There is no height or weight on file to calculate BMI. Constitutional Well developed. Well nourished.  Vascular Dorsalis pedis pulses palpable bilaterally. Posterior tibial pulses palpable bilaterally. Capillary refill normal to all digits.  No cyanosis or clubbing noted. Pedal hair growth normal.  Neurologic Normal speech. Oriented to person, place, and time. Epicritic sensation to light touch grossly present bilaterally.  Dermatologic Nails well groomed and normal in appearance. No open wounds. No skin lesions.  Orthopedic: Pain on palpation of right dorsal midfoot.  Pain at approximately third tarsometatarsal joint.  Negative extensor flexor tendinitis noted.  Lisfranc interval is intact.   Radiographs: None Assessment:   1. Arthritis of right midfoot    Plan:  Patient was evaluated and treated and all questions answered.  Right midfoot arthritis -All questions and concerns were discussed with the patient extensive detail given the amount of pain that she is having she will benefit from steroid injection to help decrease the confirmatory component associate with pain.  Patient agrees with plan like to proceed with steroid injection -A steroid  injection was performed at right midfoot using 1% plain Lidocaine and 10 mg of Kenalog. This was well tolerated. -Shoe gear modification was discussed   No follow-ups on file.

## 2023-06-16 DIAGNOSIS — H903 Sensorineural hearing loss, bilateral: Secondary | ICD-10-CM | POA: Diagnosis not present

## 2023-06-16 DIAGNOSIS — H6121 Impacted cerumen, right ear: Secondary | ICD-10-CM | POA: Diagnosis not present

## 2023-06-19 ENCOUNTER — Other Ambulatory Visit: Payer: Self-pay | Admitting: Family Medicine

## 2023-06-19 ENCOUNTER — Telehealth: Payer: Self-pay | Admitting: Family Medicine

## 2023-06-19 DIAGNOSIS — I10 Essential (primary) hypertension: Secondary | ICD-10-CM

## 2023-06-19 NOTE — Telephone Encounter (Signed)
Placed in your review folder

## 2023-06-19 NOTE — Telephone Encounter (Signed)
[facility] Signify Health Documentation: Lab Test Results

## 2023-07-05 ENCOUNTER — Other Ambulatory Visit: Payer: Self-pay | Admitting: Family Medicine

## 2023-07-05 DIAGNOSIS — E785 Hyperlipidemia, unspecified: Secondary | ICD-10-CM

## 2023-07-07 DIAGNOSIS — Z961 Presence of intraocular lens: Secondary | ICD-10-CM | POA: Diagnosis not present

## 2023-07-17 ENCOUNTER — Ambulatory Visit: Payer: Medicare PPO | Admitting: Family Medicine

## 2023-07-22 ENCOUNTER — Ambulatory Visit: Payer: Medicare PPO | Admitting: Family Medicine

## 2023-07-23 ENCOUNTER — Other Ambulatory Visit: Payer: Self-pay | Admitting: Family Medicine

## 2023-07-23 DIAGNOSIS — E039 Hypothyroidism, unspecified: Secondary | ICD-10-CM

## 2023-07-24 ENCOUNTER — Encounter: Payer: Self-pay | Admitting: Family Medicine

## 2023-07-24 ENCOUNTER — Ambulatory Visit: Payer: Medicare PPO | Admitting: Family Medicine

## 2023-07-24 VITALS — BP 134/78 | HR 62 | Temp 98.0°F | Ht 65.0 in | Wt 200.8 lb

## 2023-07-24 DIAGNOSIS — R42 Dizziness and giddiness: Secondary | ICD-10-CM

## 2023-07-24 DIAGNOSIS — E039 Hypothyroidism, unspecified: Secondary | ICD-10-CM

## 2023-07-24 DIAGNOSIS — F32A Depression, unspecified: Secondary | ICD-10-CM | POA: Diagnosis not present

## 2023-07-24 DIAGNOSIS — I10 Essential (primary) hypertension: Secondary | ICD-10-CM

## 2023-07-24 DIAGNOSIS — E785 Hyperlipidemia, unspecified: Secondary | ICD-10-CM

## 2023-07-24 DIAGNOSIS — Z23 Encounter for immunization: Secondary | ICD-10-CM

## 2023-07-24 DIAGNOSIS — F439 Reaction to severe stress, unspecified: Secondary | ICD-10-CM

## 2023-07-24 LAB — CBC
HCT: 42 % (ref 36.0–46.0)
Hemoglobin: 13.5 g/dL (ref 12.0–15.0)
MCHC: 32.2 g/dL (ref 30.0–36.0)
MCV: 87.4 fL (ref 78.0–100.0)
Platelets: 305 10*3/uL (ref 150.0–400.0)
RBC: 4.81 Mil/uL (ref 3.87–5.11)
RDW: 14.9 % (ref 11.5–15.5)
WBC: 7.5 10*3/uL (ref 4.0–10.5)

## 2023-07-24 LAB — COMPREHENSIVE METABOLIC PANEL
ALT: 25 U/L (ref 0–35)
AST: 29 U/L (ref 0–37)
Albumin: 4.3 g/dL (ref 3.5–5.2)
Alkaline Phosphatase: 75 U/L (ref 39–117)
BUN: 28 mg/dL — ABNORMAL HIGH (ref 6–23)
CO2: 32 meq/L (ref 19–32)
Calcium: 9.8 mg/dL (ref 8.4–10.5)
Chloride: 95 meq/L — ABNORMAL LOW (ref 96–112)
Creatinine, Ser: 0.71 mg/dL (ref 0.40–1.20)
GFR: 81.77 mL/min (ref >60.00)
Glucose, Bld: 86 mg/dL (ref 70–99)
Potassium: 3.8 meq/L (ref 3.5–5.1)
Sodium: 136 meq/L (ref 135–145)
Total Bilirubin: 0.6 mg/dL (ref 0.2–1.2)
Total Protein: 7.3 g/dL (ref 6.0–8.3)

## 2023-07-24 LAB — LIPID PANEL
Cholesterol: 219 mg/dL — ABNORMAL HIGH (ref 0–200)
HDL: 80.2 mg/dL (ref >39.00)
LDL Cholesterol: 121 mg/dL — ABNORMAL HIGH (ref 0–99)
NonHDL: 139.28
Total CHOL/HDL Ratio: 3
Triglycerides: 89 mg/dL (ref 0.0–149.0)
VLDL: 17.8 mg/dL (ref 0.0–40.0)

## 2023-07-24 LAB — TSH: TSH: 2.28 u[IU]/mL (ref 0.35–5.50)

## 2023-07-24 MED ORDER — DULOXETINE HCL 60 MG PO CPEP: 60.0000 mg | ORAL_CAPSULE | Freq: Every day | ORAL | 1 refills | Status: DC

## 2023-07-24 NOTE — Progress Notes (Signed)
Subjective:  Patient ID: Elizabeth Lin, female    DOB: April 27, 1946  Age: 77 y.o. MRN: [id number]  CC:  Chief Complaint  Patient presents with   Medical Management of Chronic Issues    Pt is doing well, would like to see someone to speak about her depression needs referral has issues going on with family     HPI [name] presents for   Hypertension: Last discussed in April.  Previous visit had occasional rare dizziness, possible orthostatic component.  Rare missed dose of HCTZ at that time.  Chronic edema.  Improved adherence on April 18 visit and dizziness had improved.  Cardiologist Dr. Herbie Baltimore.  History of hypertension, mild aortic stenosis, pedal edema, family history of premature CAD. Still taking hydrochlorothiazide 25 mg daily. Few missed doses at times, usually taking daily. No new side effects or symptoms.  Home readings: none BP Readings from Last 3 Encounters:  07/24/23 134/78  01/15/23 118/76  12/31/22 130/84   Lab Results  Component Value Date   CREATININE 0.90 02/26/2022   Hyperlipidemia: Lipitor 10 mg 3 days/week. No new myalgias/side effects. Rare leg cramps at night. Not nightly.  4 glasses water per day.  Lab Results  Component Value Date   CHOL 217 (H) 04/21/2022   HDL 76.40 04/21/2022   LDLCALC 126 (H) 04/21/2022   TRIG 75.0 04/21/2022   CHOLHDL 3 04/21/2022   Lab Results  Component Value Date   ALT 25 02/26/2022   AST 25 02/26/2022   ALKPHOS 65 02/26/2022   BILITOT 0.8 02/26/2022   Hypothyroidism: Lab Results  Component Value Date   TSH 5.40 02/26/2022  Synthroid 150 mcg daily. Overdue for labs - did not have pended labs in April.  Taking medication daily.  No new hot or cold intolerance. No new hair or skin changes, heart palpitations or new fatigue. No new weight changes.   Depression: Treated with Cymbalta total 40 mg dosing per day.  Was working well in April but situational stressors recently with her family- handicapped  daughter - deaf, borderline intelligence - developmentally delayed. schizophrenia, had covid a month ago - did ok but hx of asthma, pneumonia a week later - hospitalized for 2 days. Home now. Has to have extra reminders. Has home care for assistance 5 days per week. Has looked into group home situation. Option in [place]. Currently funding in place for devt. delay, but that would stop if diagnosis changes for that group home. Has care coordinator that is assisting locally. Stress has affected her more past few months. Missed work assignment yesterday, cancelling other appointments.  Some increased sleep when feeling depressed.  No current therapist, but helpful in past. Would like to meet with therapist.  No SI.     07/24/2023    9:32 AM 03/26/2023   11:45 AM 01/15/2023    3:25 PM 12/31/2022    4:25 PM 06/20/2022   10:34 AM  Depression screen PHQ 2/9  Decreased Interest 0 0 0 0 0  Down, Depressed, Hopeless 3 0 1 0 0  PHQ - 2 Score 3 0 1 0 0  Altered sleeping 3 0 1 2 0  Tired, decreased energy 2 0 2 2 0  Change in appetite 3 0 3 2 0  Feeling bad or failure about yourself  0 0 0 0 0  Trouble concentrating 0 0 1 0 0  Moving slowly or fidgety/restless 0 0 0 0 0  Suicidal thoughts 0 0 0 0 0  PHQ-9 Score 11 0 8 6 0  Difficult doing work/chores Somewhat difficult Not difficult at all Not difficult at all Not difficult at all Not difficult at all     Flu, pna vaccines given today     History Patient Active Problem List   Diagnosis Date Noted   Aftercare following surgery 03/06/2023   S/P reverse total shoulder arthroplasty, left 03/06/2023   Degenerative joint disease of shoulder region 11/09/2022   Excessive cerumen in ear canal, right 07/17/2020   Neck swelling 07/17/2020   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/08/2019   Sensorineural hearing loss (SNHL), bilateral 04/27/2019   Meniere disease, left 12/12/2017   Mild aortic stenosis 11/10/2017   Bilateral edema of lower extremity 11/10/2017   Family  history of premature CAD 11/10/2017   Arthralgia of multiple joints 10/23/2017   Class 1 obesity due to excess calories without serious comorbidity with body mass index (BMI) of 32.0 to 32.9 in adult 10/23/2017   Essential hypertension 10/23/2017   Right bundle branch block (RBBB) 10/23/2017   Pure hypercholesterolemia 10/23/2017   Vitamin D deficiency 09/06/2014   Right knee pain 02/24/2013   Hypothyroid 11/02/2011   Depression 11/02/2011   History of positive PPD 11/02/2011   Past Medical History:  Diagnosis Date   Allergy    Anxiety    Arthritis    Carpal tunnel syndrome    right hand, getting injections   Cataract    Depression    Dysrhythmia    BBB   Hammer toe    Hypothyroidism due to Hashimoto's thyroiditis    Meniere disease, right    Past Surgical History:  Procedure Laterality Date   APPENDECTOMY     COLONOSCOPY     CYSTECTOMY     eye   LAPAROSCOPIC ASSISTED SPIGELIAN HERNIA REPAIR N/A 11/08/2019   Procedure: LAPAROSCOPIC ASSISTED SPIGELIAN HERNIA REPAIR WITH MESH;  Surgeon: Manus Rudd, MD;  Location: [facility];  Service: General;  Laterality: N/A;   OOPHORECTOMY Right    TONSILLECTOMY     TRANSTHORACIC ECHOCARDIOGRAM  12/09/2019   EF 60 to 65%.  No R WMA.  GR 1 DD.  Normal RV function.  Aortic calcification with sclerosis but no suggestion of stenosis.  (Mean gradient 8 mmHg)   Allergies  Allergen Reactions   Penicillins Rash    Did it involve swelling of the face/tongue/throat, SOB, or low BP? No Did it involve sudden or severe rash/hives, skin peeling, or any reaction on the inside of your mouth or nose? Yes Did you need to seek medical attention at a hospital or doctor's office? No When did it last happen?  ~6-10 years ago     If all above answers are "NO", may proceed with cephalosporin use.    Amoxicillin-Pot Clavulanate     Red rash Did it involve swelling of the face/tongue/throat, SOB, or low BP? No Did it involve sudden or severe rash/hives,  skin peeling, or any reaction on the inside of your mouth or nose? Yes Did you need to seek medical attention at a hospital or doctor's office? No When did it last happen? ~6-10 years ago    If all above answers are "NO", may proceed with cephalosporin use.    Sulfa Antibiotics    Prior to Admission medications   Medication Sig Start Date End Date Taking? Authorizing Provider  acetaminophen (TYLENOL) 500 MG tablet Take by mouth.   Yes [provider]  ascorbic acid (VITAMIN C) 1000 MG tablet Take by mouth.  Yes [provider]  atorvastatin (LIPITOR) 10 MG tablet TAKE 1 TABLET BY MOUTH THREE TIMES A WEEK 07/06/23  Yes Shade Flood, MD  Calcium-Vitamin D-Vitamin K (CALCIUM + D + K PO) Take 1 tablet by mouth 2 (two) times daily. 1300 mg+D1000 IU   Yes [provider]  fish oil-omega-3 fatty acids 1000 MG capsule Take 1 g by mouth 2 (two) times daily.    Yes [provider]  hydrochlorothiazide (HYDRODIURIL) 25 MG tablet TAKE 1 TABLET (25 MG TOTAL) BY MOUTH DAILY. 06/19/23  Yes Shade Flood, MD  ibuprofen (ADVIL) 200 MG tablet Take by mouth.   Yes [provider]  ketoconazole (NIZORAL) 2 % shampoo APPLY 1 APPLICATION TOPICALLY ONCE A WEEK. 10/27/22  Yes Shade Flood, MD  levothyroxine (SYNTHROID) 150 MCG tablet TAKE 1 TABLET BY MOUTH EVERY DAY 07/23/23  Yes Shade Flood, MD  naproxen sodium (ALEVE) 220 MG tablet Take 220-440 mg by mouth 2 (two) times daily as needed (pain/soreness).   Yes [provider]  ofloxacin (OCUFLOX) 0.3 % ophthalmic solution Apply to eye.   Yes [provider]  Polyethyl Glycol-Propyl Glycol (LUBRICANT EYE DROPS) 0.4-0.3 % SOLN Place 1 drop into both eyes 3 (three) times daily as needed (dry/irritated eyes.). Rohto Ice Multi-Symptom Relief   Yes [provider]  Vitamin E (VITAMIN E/D-ALPHA NATURAL) 268 MG (400 UNIT) CAPS Take by mouth.   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  3 children, 3 grandchildren.   Patient's  Education: [employer].  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   Patient is right handed   Caffeine consumption 2-3 daily    Walks daily for roughly 30 minutes at a time.  She has not been doing it in the wintertime due to cold weather.      Social Determinants of Health   Financial Resource Strain: Low Risk  (03/26/2023)   Overall Financial Resource Strain (CARDIA)    Difficulty of Paying Living Expenses: Not hard at all  Food Insecurity: Low Risk  (06/16/2023)   Received from [facility]   Hunger Vital Sign    Worried About Running Out of Food in the Last Year: Never true    Ran Out of Food in the Last Year: Never true  Transportation Needs: No Transportation Needs (06/16/2023)   Received from [facility]    In the past 12 months, has lack of reliable transportation kept you from medical appointments, meetings, work or from getting things needed  for daily living? : No  Physical Activity: Insufficiently Active (03/26/2023)   Exercise Vital Sign    Days of Exercise per Week: 2 days     Minutes of Exercise per Session: 30 min  Stress: No Stress Concern Present (03/26/2023)   [employer] of Occupational Health - Occupational Stress Questionnaire    Feeling of Stress : Not at all  Social Connections: Socially Isolated (03/26/2023)   Social Connection and Isolation Panel [NHANES]    Frequency of Communication with Friends and Family: Three times a week    Frequency of Social Gatherings with Friends and Family: Three times a week    Attends Religious Services: Never    Active Member of Clubs or Organizations: No    Attends Banker Meetings: Never    Marital Status: Divorced  Catering manager Violence: Not At Risk (03/26/2023)   Humiliation, Afraid, Rape, and Kick questionnaire    Fear of Current or Ex-Partner: No    Emotionally Abused: No    Physically Abused: No    Sexually Abused: No    Review of Systems  Constitutional:  Negative for fatigue and unexpected weight change.  Respiratory:  Negative for chest tightness and shortness of breath.   Cardiovascular:  Negative for chest pain, palpitations and leg swelling.  Gastrointestinal:  Negative for abdominal pain and blood in stool.  Neurological:  Negative for dizziness, syncope, light-headedness and headaches.     Objective:   Vitals:   07/24/23 0931  BP: 134/78  Pulse: 62  Temp: 98 F (36.7 C)  TempSrc: Temporal  SpO2: 98%  Weight: 200 lb 12.8 oz (91.1 kg)  Height: 5\' 5"  (1.651 m)     Physical Exam Vitals reviewed.  Constitutional:      Appearance: Normal appearance. She is well-developed.  HENT:     Head: Normocephalic and atraumatic.  Eyes:     Conjunctiva/sclera: Conjunctivae normal.     Pupils: Pupils are equal, round, and reactive to light.  Neck:     Vascular: No carotid bruit.  Cardiovascular:     Rate and Rhythm: Normal rate and regular rhythm.     Heart sounds: Normal heart sounds.  Pulmonary:     Effort: Pulmonary effort is normal.     Breath sounds: Normal breath  sounds.  Abdominal:     Palpations: Abdomen is soft. There is no pulsatile mass.     Tenderness: There is no abdominal tenderness.  Musculoskeletal:     Right lower leg: No edema.     Left lower leg: No edema.  Skin:    General: Skin is warm and dry.  Neurological:     Mental Status: She is alert and oriented to person, place, and time.  Psychiatric:        Mood and Affect: Mood normal.        Behavior: Behavior normal.        Thought Content: Thought content normal.     Comments: Good eye contact, appropriate responses, does not appear to be responding to internal stimuli, no SI.     Assessment & Plan:  ANJLI ROSSETTO is a 77 y.o. female . Depression, unspecified depression type - Plan: Ambulatory referral to Psychology Situational stress - Plan: Ambulatory referral to Psychology  -Family stressors as above.  Looking into options for her daughter, but some difficulty without process.  Will try higher dose Cymbalta for now, refer to psychology for therapy, handout given on management of depression distress and recheck in  1 month.  Option to meet with psychiatry if adjustment medications needed further.  RTC precautions if worsening symptoms.  Need for influenza vaccination - Plan: Flu Vaccine Trivalent High Dose (Fluad)  Need for vaccination against Streptococcus pneumoniae - Plan: Pneumococcal conjugate vaccine 20-valent (Prevnar 20)  Hyperlipidemia, unspecified hyperlipidemia type  -Check labs, adjust plan accordingly.  Has refills of statin with 3 times per week dosing at this time.  Hypothyroidism, unspecified type  -Tolerating current dose Synthroid, asymptomatic, check TSH and adjust plan accordingly  Essential hypertension  -Borderline but stable previously, no med changes for now.  Situational stressors as above may be contributing.  Recheck 1 month  Meds ordered this encounter  Medications   DULoxetine (CYMBALTA) 60 MG capsule    Sig: Take 1 capsule (60 mg total)  by mouth daily.    Dispense:  90 capsule    Refill:  1   Patient Instructions   Sorry to hear about your stressors at home.  See information below on managing stress and depression symptoms.  Higher dose of Cymbalta may be helpful but also meeting with therapist is a great idea.  I will place that referral.  I am happy to continue treatment with Cymbalta for now but if we do need to make some medication changes we have the option of having psychiatry assist as well.  Recheck with me in 1 month but let me know if there are questions or concerns sooner.  No other medication changes at this time.  If concerns on labs I will let you know.  Hang in there.   Managing Stress, Adult Feeling a certain amount of stress is normal. Stress helps our body and mind get ready to deal with the demands of life. Stress hormones can motivate you to do well at work and meet your responsibilities. But severe or long-term (chronic) stress can affect your mental and physical health. Chronic stress puts you at higher risk for: Anxiety and depression. Other health problems such as digestive problems, muscle aches, heart disease, high blood pressure, and stroke. What are the causes? Common causes of stress include: Demands from work, such as deadlines, feeling overworked, or having long hours. Pressures at home, such as money issues, disagreements with a spouse, or parenting issues. Pressures from major life changes, such as divorce, moving, loss of a loved one, or chronic illness. You may be at higher risk for stress-related problems if you: Do not get enough sleep. Are in poor health. Do not have emotional support. Have a mental health disorder such as anxiety or depression. How to recognize stress Stress can make you: Have trouble sleeping. Feel sad, anxious, irritable, or overwhelmed. Lose your appetite. Overeat or want to eat unhealthy foods. Want to use drugs or alcohol. Stress can also cause physical  symptoms, such as: Sore, tense muscles, especially in the shoulders and neck. Headaches. Trouble breathing. A faster heart rate. Stomach pain, nausea, or vomiting. Diarrhea or constipation. Trouble concentrating. Follow these instructions at home: Eating and drinking Eat a healthy diet. This includes: Eating foods that are high in fiber, such as beans, whole grains, and fresh fruits and vegetables. Limiting foods that are high in fat and processed sugars, such as fried or sweet foods. Do not skip meals or overeat. Drink enough fluid to keep your urine pale yellow. Alcohol use Do not drink alcohol if: Your health care provider tells you not to drink. You are pregnant, may be pregnant, or are planning to become pregnant. Drinking  alcohol is a way some people try to ease their stress. This can be dangerous, so if you drink alcohol: Limit how much you have to: 0-1 drink a day for women. 0-2 drinks a day for men. Know how much alcohol is in your drink. In the [place] one drink equals one 12 oz bottle of beer (355 mL), one 5 oz glass of wine (148 mL), or one 1 oz glass of hard liquor (44 mL). Activity  Include 30 minutes of exercise in your daily schedule. Exercise is a good stress reducer. Include time in your day for an activity that you find relaxing. Try taking a walk, going on a bike ride, reading a book, or listening to music. Schedule your time in a way that lowers stress, and keep a regular schedule. Focus on doing what is most important to get done. Lifestyle Identify the source of your stress and your reaction to it. See a therapist who can help you change unhelpful reactions. When there are stressful events: Talk about them with family, friends, or coworkers. Try to think realistically about stressful events and not ignore them or overreact. Try to find the positives in a stressful situation and not focus on the negatives. Cut back on responsibilities at work and home, if  possible. Ask for help from friends or family members if you need it. Find ways to manage stress, such as: Mindfulness, meditation, or deep breathing. Yoga or tai chi. Progressive muscle relaxation. Spending time in nature. Doing art, playing music, or reading. Making time for fun activities. Spending time with family and friends. Get support from family, friends, or spiritual resources. General instructions Get enough sleep. Try to go to sleep and get up at about the same time every day. Take over-the-counter and prescription medicines only as told by your health care provider. Do not use any products that contain nicotine or tobacco. These products include cigarettes, chewing tobacco, and vaping devices, such as e-cigarettes. If you need help quitting, ask your health care provider. Do not use drugs or smoke to deal with stress. Keep all follow-up visits. This is important. Where to find support Talk with your health care provider about stress management or finding a support group. Find a therapist to work with you on your stress management techniques. Where to find more information [employer] on Mental Illness: www.nami.org American Psychological Association: DiceTournament.ca Contact a health care provider if: Your stress symptoms get worse. You are unable to manage your stress at home. You are struggling to stop using drugs or alcohol. Get help right away if: You may be a danger to yourself or others. You have any thoughts of death or suicide. Get help right awayif you feel like you may hurt yourself or others, or have thoughts about taking your own life. Go to your nearest emergency room or: Call 911. Call the National Suicide Prevention Lifeline at (385) 464-9191 or 988 in the [place] This is open 24 hours a day. Text the Crisis Text Line at (450)275-8883. Summary Feeling a certain amount of stress is normal, but severe or long-term (chronic) stress can affect your mental and  physical health. Chronic stress can put you at higher risk for anxiety, depression, and other health problems such as digestive problems, muscle aches, heart disease, high blood pressure, and stroke. You may be at higher risk for stress-related problems if you do not get enough sleep, are in poor health, lack emotional support, or have a mental health disorder such as anxiety or  depression. Identify the source of your stress and your reaction to it. Try talking about stressful events with family, friends, or coworkers, finding a coping method, or getting support from spiritual resources. If you need more help, talk with your health care provider about finding a support group or a mental health therapist. This information is not intended to replace advice given to you by your health care provider. Make sure you discuss any questions you have with your health care provider. Document Revised: 04/11/2021 Document Reviewed: 04/09/2021 Elsevier Patient Education  2024 Elsevier Inc.   Managing Depression, Adult Depression is a mental health condition that affects your thoughts, feelings, and actions. Being diagnosed with depression can bring you relief if you did not know why you have felt or behaved a certain way. It could also leave you feeling overwhelmed. Finding ways to manage your symptoms can help you feel more positive about your future. How to manage lifestyle changes Being depressed is difficult. Depression can increase the level of everyday stress. Stress can make depression symptoms worse. You may believe your symptoms cannot be managed or will never improve. However, there are many things you can try to help manage your symptoms. There is hope. Managing stress  Stress is your body's reaction to life changes and events, both good and bad. Stress can add to your feelings of depression. Learning to manage your stress can help lessen your feelings of depression. Try some of the following  approaches to reducing your stress (stress reduction techniques): Listen to music that you enjoy and that inspires you. Try using a meditation app or take a meditation class. Develop a practice that helps you connect with your spiritual self. Walk in nature, pray, or go to a place of worship. Practice deep breathing. To do this, inhale slowly through your nose. Pause at the top of your inhale for a few seconds and then exhale slowly, letting yourself relax. Repeat this three or four times. Practice yoga to help relax and work your muscles. Choose a stress reduction technique that works for you. These techniques take time and practice to develop. Set aside 5-15 minutes a day to do them. Therapists can offer training in these techniques. Do these things to help manage stress: Keep a journal. Know your limits. Set healthy boundaries for yourself and others, such as saying "no" when you think something is too much. Pay attention to how you react to certain situations. You may not be able to control everything, but you can change your reaction. Add humor to your life by watching funny movies or shows. Make time for activities that you enjoy and that relax you. Spend less time using electronics, especially at night before bed. The light from screens can make your brain think it is time to get up rather than go to bed.  Medicines Medicines, such as antidepressants, are often a part of treatment for depression. Talk with your pharmacist or health care provider about all the medicines, supplements, and herbal products that you take, their possible side effects, and what medicines and other products are safe to take together. Make sure to report any side effects you may have to your health care provider. Relationships Your health care provider may suggest family therapy, couples therapy, or individual therapy as part of your treatment. How to recognize changes Everyone responds differently to treatment  for depression. As you recover from depression, you may start to: Have more interest in doing activities. Feel more hopeful. Have more energy. Eat  a more regular amount of food. Have better mental focus. It is important to recognize if your depression is not getting better or is getting worse. The symptoms you had in the beginning may return, such as: Feeling tired. Eating too much or too little. Sleeping too much or too little. Feeling restless, agitated, or hopeless. Trouble focusing or making decisions. Having unexplained aches and pains. Feeling irritable, angry, or aggressive. If you or your family members notice these symptoms coming back, let your health care provider know right away. Follow these instructions at home: Activity Try to get some form of exercise each day, such as walking. Try yoga, mindfulness, or other stress reduction techniques. Participate in group activities if you are able. Lifestyle Get enough sleep. Cut down on or stop using caffeine, tobacco, alcohol, and any other harmful substances. Eat a healthy diet that includes plenty of vegetables, fruits, whole grains, low-fat dairy products, and lean protein. Limit foods that are high in solid fats, added sugar, or salt (sodium). General instructions Take over-the-counter and prescription medicines only as told by your health care provider. Keep all follow-up visits. It is important for your health care provider to check on your mood, behavior, and medicines. Your health care provider may need to make changes to your treatment. Where to find support Talking to others  Friends and family members can be sources of support and guidance. Talk to trusted friends or family members about your condition. Explain your symptoms and let them know that you are working with a health care provider to treat your depression. Tell friends and family how they can help. Finances Find mental health providers that fit with your  financial situation. Talk with your health care provider if you are worried about access to food, housing, or medicine. Call your insurance company to learn about your co-pays and prescription plan. Where to find more information You can find support in your area from: Anxiety and Depression Association of America (ADAA): adaa.org Mental Health America: mentalhealthamerica.net The [employer] on Mental Illness: nami.org Contact a health care provider if: You stop taking your antidepressant medicines, and you have any of these symptoms: Nausea. Headache. Light-headedness. Chills and body aches. Not being able to sleep (insomnia). You or your friends and family think your depression is getting worse. Get help right away if: You have thoughts of hurting yourself or others. Get help right away if you feel like you may hurt yourself or others, or have thoughts about taking your own life. Go to your nearest emergency room or: Call 911. Call the National Suicide Prevention Lifeline at 630-828-9925 or 988. This is open 24 hours a day. Text the Crisis Text Line at 3610297351. This information is not intended to replace advice given to you by your health care provider. Make sure you discuss any questions you have with your health care provider. Document Revised: 01/21/2022 Document Reviewed: 01/21/2022 Elsevier Patient Education  2024 Elsevier Inc.     Signed,   Meredith Staggers, MD  Primary Care, [facility] 07/24/23 10:01 AM

## 2023-07-24 NOTE — Patient Instructions (Signed)
Sorry to hear about your stressors at home.  See information below on managing stress and depression symptoms.  Higher dose of Cymbalta may be helpful but also meeting with therapist is a great idea.  I will place that referral.  I am happy to continue treatment with Cymbalta for now but if we do need to make some medication changes we have the option of having psychiatry assist as well.  Recheck with me in 1 month but let me know if there are questions or concerns sooner.  No other medication changes at this time.  If concerns on labs I will let you know.  Hang in there.   Managing Stress, Adult Feeling a certain amount of stress is normal. Stress helps our body and mind get ready to deal with the demands of life. Stress hormones can motivate you to do well at work and meet your responsibilities. But severe or long-term (chronic) stress can affect your mental and physical health. Chronic stress puts you at higher risk for: Anxiety and depression. Other health problems such as digestive problems, muscle aches, heart disease, high blood pressure, and stroke. What are the causes? Common causes of stress include: Demands from work, such as deadlines, feeling overworked, or having long hours. Pressures at home, such as money issues, disagreements with a spouse, or parenting issues. Pressures from major life changes, such as divorce, moving, loss of a loved one, or chronic illness. You may be at higher risk for stress-related problems if you: Do not get enough sleep. Are in poor health. Do not have emotional support. Have a mental health disorder such as anxiety or depression. How to recognize stress Stress can make you: Have trouble sleeping. Feel sad, anxious, irritable, or overwhelmed. Lose your appetite. Overeat or want to eat unhealthy foods. Want to use drugs or alcohol. Stress can also cause physical symptoms, such as: Sore, tense muscles, especially in the shoulders and  neck. Headaches. Trouble breathing. A faster heart rate. Stomach pain, nausea, or vomiting. Diarrhea or constipation. Trouble concentrating. Follow these instructions at home: Eating and drinking Eat a healthy diet. This includes: Eating foods that are high in fiber, such as beans, whole grains, and fresh fruits and vegetables. Limiting foods that are high in fat and processed sugars, such as fried or sweet foods. Do not skip meals or overeat. Drink enough fluid to keep your urine pale yellow. Alcohol use Do not drink alcohol if: Your health care provider tells you not to drink. You are pregnant, may be pregnant, or are planning to become pregnant. Drinking alcohol is a way some people try to ease their stress. This can be dangerous, so if you drink alcohol: Limit how much you have to: 0-1 drink a day for women. 0-2 drinks a day for men. Know how much alcohol is in your drink. In the [place] one drink equals one 12 oz bottle of beer (355 mL), one 5 oz glass of wine (148 mL), or one 1 oz glass of hard liquor (44 mL). Activity  Include 30 minutes of exercise in your daily schedule. Exercise is a good stress reducer. Include time in your day for an activity that you find relaxing. Try taking a walk, going on a bike ride, reading a book, or listening to music. Schedule your time in a way that lowers stress, and keep a regular schedule. Focus on doing what is most important to get done. Lifestyle Identify the source of your stress and your reaction to it. See  a therapist who can help you change unhelpful reactions. When there are stressful events: Talk about them with family, friends, or coworkers. Try to think realistically about stressful events and not ignore them or overreact. Try to find the positives in a stressful situation and not focus on the negatives. Cut back on responsibilities at work and home, if possible. Ask for help from friends or family members if you need it. Find  ways to manage stress, such as: Mindfulness, meditation, or deep breathing. Yoga or tai chi. Progressive muscle relaxation. Spending time in nature. Doing art, playing music, or reading. Making time for fun activities. Spending time with family and friends. Get support from family, friends, or spiritual resources. General instructions Get enough sleep. Try to go to sleep and get up at about the same time every day. Take over-the-counter and prescription medicines only as told by your health care provider. Do not use any products that contain nicotine or tobacco. These products include cigarettes, chewing tobacco, and vaping devices, such as e-cigarettes. If you need help quitting, ask your health care provider. Do not use drugs or smoke to deal with stress. Keep all follow-up visits. This is important. Where to find support Talk with your health care provider about stress management or finding a support group. Find a therapist to work with you on your stress management techniques. Where to find more information The [employer] on Mental Illness: www.nami.org American Psychological Association: [employer] Contact a health care provider if: Your stress symptoms get worse. You are unable to manage your stress at home. You are struggling to stop using drugs or alcohol. Get help right away if: You may be a danger to yourself or others. You have any thoughts of death or suicide. Get help right awayif you feel like you may hurt yourself or others, or have thoughts about taking your own life. Go to your nearest emergency room or: Call 911. Call the National Suicide Prevention Lifeline at (305)357-3753 or 988 in the [place] This is open 24 hours a day. Text the Crisis Text Line at 586-163-8339. Summary Feeling a certain amount of stress is normal, but severe or long-term (chronic) stress can affect your mental and physical health. Chronic stress can put you at higher risk for anxiety,  depression, and other health problems such as digestive problems, muscle aches, heart disease, high blood pressure, and stroke. You may be at higher risk for stress-related problems if you do not get enough sleep, are in poor health, lack emotional support, or have a mental health disorder such as anxiety or depression. Identify the source of your stress and your reaction to it. Try talking about stressful events with family, friends, or coworkers, finding a coping method, or getting support from spiritual resources. If you need more help, talk with your health care provider about finding a support group or a mental health therapist. This information is not intended to replace advice given to you by your health care provider. Make sure you discuss any questions you have with your health care provider. Document Revised: 04/11/2021 Document Reviewed: 04/09/2021 Elsevier Patient Education  2024 Elsevier Inc.   Managing Depression, Adult Depression is a mental health condition that affects your thoughts, feelings, and actions. Being diagnosed with depression can bring you relief if you did not know why you have felt or behaved a certain way. It could also leave you feeling overwhelmed. Finding ways to manage your symptoms can help you feel more positive about your future. How to manage  lifestyle changes Being depressed is difficult. Depression can increase the level of everyday stress. Stress can make depression symptoms worse. You may believe your symptoms cannot be managed or will never improve. However, there are many things you can try to help manage your symptoms. There is hope. Managing stress  Stress is your body's reaction to life changes and events, both good and bad. Stress can add to your feelings of depression. Learning to manage your stress can help lessen your feelings of depression. Try some of the following approaches to reducing your stress (stress reduction techniques): Listen to  music that you enjoy and that inspires you. Try using a meditation app or take a meditation class. Develop a practice that helps you connect with your spiritual self. Walk in nature, pray, or go to a place of worship. Practice deep breathing. To do this, inhale slowly through your nose. Pause at the top of your inhale for a few seconds and then exhale slowly, letting yourself relax. Repeat this three or four times. Practice yoga to help relax and work your muscles. Choose a stress reduction technique that works for you. These techniques take time and practice to develop. Set aside 5-15 minutes a day to do them. Therapists can offer training in these techniques. Do these things to help manage stress: Keep a journal. Know your limits. Set healthy boundaries for yourself and others, such as saying "no" when you think something is too much. Pay attention to how you react to certain situations. You may not be able to control everything, but you can change your reaction. Add humor to your life by watching funny movies or shows. Make time for activities that you enjoy and that relax you. Spend less time using electronics, especially at night before bed. The light from screens can make your brain think it is time to get up rather than go to bed.  Medicines Medicines, such as antidepressants, are often a part of treatment for depression. Talk with your pharmacist or health care provider about all the medicines, supplements, and herbal products that you take, their possible side effects, and what medicines and other products are safe to take together. Make sure to report any side effects you may have to your health care provider. Relationships Your health care provider may suggest family therapy, couples therapy, or individual therapy as part of your treatment. How to recognize changes Everyone responds differently to treatment for depression. As you recover from depression, you may start to: Have more  interest in doing activities. Feel more hopeful. Have more energy. Eat a more regular amount of food. Have better mental focus. It is important to recognize if your depression is not getting better or is getting worse. The symptoms you had in the beginning may return, such as: Feeling tired. Eating too much or too little. Sleeping too much or too little. Feeling restless, agitated, or hopeless. Trouble focusing or making decisions. Having unexplained aches and pains. Feeling irritable, angry, or aggressive. If you or your family members notice these symptoms coming back, let your health care provider know right away. Follow these instructions at home: Activity Try to get some form of exercise each day, such as walking. Try yoga, mindfulness, or other stress reduction techniques. Participate in group activities if you are able. Lifestyle Get enough sleep. Cut down on or stop using caffeine, tobacco, alcohol, and any other harmful substances. Eat a healthy diet that includes plenty of vegetables, fruits, whole grains, low-fat dairy products, and lean protein.  Limit foods that are high in solid fats, added sugar, or salt (sodium). General instructions Take over-the-counter and prescription medicines only as told by your health care provider. Keep all follow-up visits. It is important for your health care provider to check on your mood, behavior, and medicines. Your health care provider may need to make changes to your treatment. Where to find support Talking to others  Friends and family members can be sources of support and guidance. Talk to trusted friends or family members about your condition. Explain your symptoms and let them know that you are working with a health care provider to treat your depression. Tell friends and family how they can help. Finances Find mental health providers that fit with your financial situation. Talk with your health care provider if you are worried  about access to food, housing, or medicine. Call your insurance company to learn about your co-pays and prescription plan. Where to find more information You can find support in your area from: Anxiety and Depression Association of America (ADAA): adaa.org Mental Health America: mentalhealthamerica.net The [employer] on Mental Illness: nami.org Contact a health care provider if: You stop taking your antidepressant medicines, and you have any of these symptoms: Nausea. Headache. Light-headedness. Chills and body aches. Not being able to sleep (insomnia). You or your friends and family think your depression is getting worse. Get help right away if: You have thoughts of hurting yourself or others. Get help right away if you feel like you may hurt yourself or others, or have thoughts about taking your own life. Go to your nearest emergency room or: Call 911. Call the National Suicide Prevention Lifeline at 902-361-8916 or 988. This is open 24 hours a day. Text the Crisis Text Line at 315-088-9516. This information is not intended to replace advice given to you by your health care provider. Make sure you discuss any questions you have with your health care provider. Document Revised: 01/21/2022 Document Reviewed: 01/21/2022 Elsevier Patient Education  2024 [employer].

## 2023-08-03 ENCOUNTER — Telehealth: Payer: Self-pay

## 2023-08-03 NOTE — Telephone Encounter (Signed)
Thyroid test and blood counts were normal.  Electrolytes overall look okay.  Borderline elevated BUN reading stable, borderline chloride not concerning.  Cholesterol levels were slightly elevated but stable.  If you are able to take an additional dose of the Lipitor once per week that may help get those readings a little bit closer to goal.  If that is not tolerated, then stay at 3 days/week.  Please let me know if there are questions.   Dr. Neva Seat

## 2023-08-04 NOTE — Telephone Encounter (Signed)
Pt has reviewed via MyChart

## 2023-08-07 NOTE — Telephone Encounter (Signed)
Pt would a call from [facility]

## 2023-08-07 NOTE — Telephone Encounter (Signed)
Called pt back no answer, LM

## 2023-08-12 DIAGNOSIS — M25552 Pain in left hip: Secondary | ICD-10-CM | POA: Diagnosis not present

## 2023-08-17 DIAGNOSIS — R2689 Other abnormalities of gait and mobility: Secondary | ICD-10-CM | POA: Diagnosis not present

## 2023-08-17 DIAGNOSIS — M6281 Muscle weakness (generalized): Secondary | ICD-10-CM | POA: Diagnosis not present

## 2023-08-17 DIAGNOSIS — M25652 Stiffness of left hip, not elsewhere classified: Secondary | ICD-10-CM | POA: Diagnosis not present

## 2023-08-17 DIAGNOSIS — M25552 Pain in left hip: Secondary | ICD-10-CM | POA: Diagnosis not present

## 2023-08-20 ENCOUNTER — Ambulatory Visit: Payer: Medicare PPO | Admitting: Family Medicine

## 2023-08-20 ENCOUNTER — Encounter: Payer: Self-pay | Admitting: Family Medicine

## 2023-08-20 VITALS — BP 144/70 | HR 57 | Temp 98.4°F | Ht 65.0 in | Wt 196.4 lb

## 2023-08-20 DIAGNOSIS — F32A Depression, unspecified: Secondary | ICD-10-CM | POA: Diagnosis not present

## 2023-08-20 DIAGNOSIS — R2689 Other abnormalities of gait and mobility: Secondary | ICD-10-CM | POA: Diagnosis not present

## 2023-08-20 DIAGNOSIS — M6281 Muscle weakness (generalized): Secondary | ICD-10-CM | POA: Diagnosis not present

## 2023-08-20 DIAGNOSIS — R42 Dizziness and giddiness: Secondary | ICD-10-CM

## 2023-08-20 DIAGNOSIS — M25652 Stiffness of left hip, not elsewhere classified: Secondary | ICD-10-CM | POA: Diagnosis not present

## 2023-08-20 DIAGNOSIS — M25552 Pain in left hip: Secondary | ICD-10-CM | POA: Diagnosis not present

## 2023-08-20 DIAGNOSIS — H8102 Meniere's disease, left ear: Secondary | ICD-10-CM

## 2023-08-20 NOTE — Progress Notes (Signed)
Subjective:  Patient ID: Elizabeth Lin, female    DOB: June 13, 1946  Age: 77 y.o. MRN: [id number]  CC:  Chief Complaint  Patient presents with   Depression    Pt is here for recheck notes doing better notes sleeping less during the day    Dizziness    Pt notes she has had long term intermittent dizziness with certain neck movements and is now getting a spinning feeling     HPI Elizabeth Lin presents for  Depression Follow-up from October visit.  Family stressors discussed at that time.  Dosage of Cymbalta was increased from 40 to 60 mg/day., referred to psychology for therapy and handout given on management of depression and stress.  If further medication adjustment is needed we discussed possible psychiatry eval. Today she feels like she is having less sleepiness during the day. Slight nausea that is brief and resolves, intermittent. Unsure if new with higher cymbalta dose.  Has not received call form therapist Referral faxed to [facility]    [phone]      08/20/2023   12:25 PM 07/24/2023    9:32 AM 03/26/2023   11:45 AM 01/15/2023    3:25 PM 12/31/2022    4:25 PM  Depression screen PHQ 2/9  Decreased Interest 2 0 0 0 0  Down, Depressed, Hopeless 1 3 0 1 0  PHQ - 2 Score 3 3 0 1 0  Altered sleeping 3 3 0 1 2  Tired, decreased energy 3 2 0 2 2  Change in appetite 3 3 0 3 2  Feeling bad or failure about yourself  2 0 0 0 0  Trouble concentrating 1 0 0 1 0  Moving slowly or fidgety/restless 0 0 0 0 0  Suicidal thoughts 0 0 0 0 0  PHQ-9 Score 15 11 0 8 6  Difficult doing work/chores  Somewhat difficult Not difficult at all Not difficult at all Not difficult at all      08/20/2023   12:27 PM 07/24/2023    9:32 AM 01/15/2023    3:25 PM 12/31/2022    4:25 PM  GAD 7 : Generalized Anxiety Score  Nervous, Anxious, on Edge 0 1 0 0  Control/stop worrying 0 2 0 0  Worry too much - different things 0 0 0 0  Trouble relaxing 2 0 0 0  Restless 1 0 0 0   Easily annoyed or irritable 0 0 0 0  Afraid - awful might happen 0 3 0 0  Total GAD 7 Score 3 6 0 0  Anxiety Difficulty   Not difficult at all Not difficult at all    Dizziness Has noted some intermittent dizziness with certain head movements.  Room spinning feeling. Episode years ago. Hx of meniere's. Rare episodes. Had typical flare yesterday - room spinning after head movement. Resolved yesterday. No current sx's. No CP/palpitations. Feeling ok now. Managed with daily hydrochlorothiazide. Denies current dizziness, some intermittent on past - plans on follow up if recurs.   Hypertension: Borderline last visit, thought that some of her situational stressors may have been contributing.  Was continued on HCTZ 25 mg daily. Has been eating healthier. Some weight loss since last labs.  Home readings: none.  BP Readings from Last 3 Encounters:  08/20/23 (!) 144/70  07/24/23 134/78  01/15/23 118/76   Lab Results  Component Value Date   CREATININE 0.71 07/24/2023  Orthostatic VS for the past 24 hrs (Last 3 readings):  BP- Lying Pulse- Lying BP- Sitting Pulse- Sitting BP- Standing at 0 minutes Pulse- Standing at 0 minutes BP- Standing at 3 minutes Pulse- Standing at 3 minutes  08/20/23 1152 144/70 57 140/78 60 128/70 63 132/78 61        History Patient Active Problem List   Diagnosis Date Noted   Aftercare following surgery 03/06/2023   S/P reverse total shoulder arthroplasty, left 03/06/2023   Degenerative joint disease of shoulder region 11/09/2022   Excessive cerumen in ear canal, right 07/17/2020   Neck swelling 07/17/2020   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/08/2019   Sensorineural hearing loss (SNHL), bilateral 04/27/2019   Meniere disease, left 12/12/2017   Mild aortic stenosis 11/10/2017   Bilateral edema of lower extremity 11/10/2017   Family history of premature CAD 11/10/2017   Arthralgia of multiple joints 10/23/2017   Class 1 obesity due to excess calories without serious  comorbidity with body mass index (BMI) of 32.0 to 32.9 in adult 10/23/2017   Essential hypertension 10/23/2017   Right bundle branch block (RBBB) 10/23/2017   Pure hypercholesterolemia 10/23/2017   Vitamin D deficiency 09/06/2014   Right knee pain 02/24/2013   Hypothyroid 11/02/2011   Depression 11/02/2011   History of positive PPD 11/02/2011   Past Medical History:  Diagnosis Date   Allergy    Anxiety    Arthritis    Carpal tunnel syndrome    right hand, getting injections   Cataract    Depression    Dysrhythmia    BBB   Hammer toe    Hypothyroidism due to Hashimoto's thyroiditis    Meniere disease, right    Past Surgical History:  Procedure Laterality Date   APPENDECTOMY     COLONOSCOPY     CYSTECTOMY     eye   LAPAROSCOPIC ASSISTED SPIGELIAN HERNIA REPAIR N/A 11/08/2019   Procedure: LAPAROSCOPIC ASSISTED SPIGELIAN HERNIA REPAIR WITH MESH;  Surgeon: Manus Rudd, MD;  Location: [facility];  Service: General;  Laterality: N/A;   OOPHORECTOMY Right    TONSILLECTOMY     TRANSTHORACIC ECHOCARDIOGRAM  12/09/2019   EF 60 to 65%.  No R WMA.  GR 1 DD.  Normal RV function.  Aortic calcification with sclerosis but no suggestion of stenosis.  (Mean gradient 8 mmHg)   Allergies  Allergen Reactions   Penicillins Rash    Did it involve swelling of the face/tongue/throat, SOB, or low BP? No Did it involve sudden or severe rash/hives, skin peeling, or any reaction on the inside of your mouth or nose? Yes Did you need to seek medical attention at a hospital or doctor's office? No When did it last happen?  ~6-10 years ago     If all above answers are "NO", may proceed with cephalosporin use.    Amoxicillin-Pot Clavulanate     Red rash Did it involve swelling of the face/tongue/throat, SOB, or low BP? No Did it involve sudden or severe rash/hives, skin peeling, or any reaction on the inside of your mouth or nose? Yes Did you need to seek medical attention at a hospital or doctor's  office? No When did it last happen? ~6-10 years ago    If all above answers are "NO", may proceed with cephalosporin use.    Sulfa Antibiotics    Prior to Admission medications   Medication Sig Start Date End Date Taking? Authorizing Provider  acetaminophen (TYLENOL) 500 MG tablet Take by mouth.   Yes [provider]  ascorbic acid (VITAMIN C) 1000 MG tablet Take  by mouth.   Yes [provider]  atorvastatin (LIPITOR) 10 MG tablet TAKE 1 TABLET BY MOUTH THREE TIMES A WEEK 07/06/23  Yes Shade Flood, MD  Calcium-Vitamin D-Vitamin K (CALCIUM + D + K PO) Take 1 tablet by mouth 2 (two) times daily. 1300 mg+D1000 IU   Yes [provider]  DULoxetine (CYMBALTA) 60 MG capsule Take 1 capsule (60 mg total) by mouth daily. 07/24/23  Yes Shade Flood, MD  fish oil-omega-3 fatty acids 1000 MG capsule Take 1 g by mouth 2 (two) times daily.    Yes [provider]  hydrochlorothiazide (HYDRODIURIL) 25 MG tablet TAKE 1 TABLET (25 MG TOTAL) BY MOUTH DAILY. 06/19/23  Yes Shade Flood, MD  ibuprofen (ADVIL) 200 MG tablet Take by mouth.   Yes [provider]  ketoconazole (NIZORAL) 2 % shampoo APPLY 1 APPLICATION TOPICALLY ONCE A WEEK. 10/27/22  Yes Shade Flood, MD  levothyroxine (SYNTHROID) 150 MCG tablet TAKE 1 TABLET BY MOUTH EVERY DAY 07/23/23  Yes Shade Flood, MD  naproxen sodium (ALEVE) 220 MG tablet Take 220-440 mg by mouth 2 (two) times daily as needed (pain/soreness).   Yes [provider]  ofloxacin (OCUFLOX) 0.3 % ophthalmic solution Apply to eye.   Yes [provider]  Polyethyl Glycol-Propyl Glycol (LUBRICANT EYE DROPS) 0.4-0.3 % SOLN Place 1 drop into both eyes 3 (three) times daily as needed (dry/irritated eyes.). Rohto Ice Multi-Symptom Relief   Yes [provider]  Vitamin E (VITAMIN E/D-ALPHA NATURAL) 268 MG (400 UNIT) CAPS Take by mouth.   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  3 children, 3 grandchildren.   Patient's  Education: [employer].  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   Patient is right handed   Caffeine consumption 2-3 daily    Walks daily for roughly 30 minutes at a time.  She has not been doing it in the wintertime due to cold weather.      Social Determinants of Health   Financial Resource Strain: Low Risk  (03/26/2023)   Overall Financial Resource Strain (CARDIA)    Difficulty of Paying Living Expenses: Not hard at all  Food Insecurity: Low Risk  (06/16/2023)   Received from [facility]   Hunger Vital Sign    Worried About Running Out of Food in the Last Year: Never true    Ran Out of Food in the Last Year: Never true  Transportation Needs: No Transportation Needs (06/16/2023)   [facility]  Transportation    In the past 12 months, has lack of reliable transportation kept you from medical appointments, meetings, work or from getting things needed for daily living? : No  Physical Activity: Insufficiently Active (03/26/2023)   Exercise Vital Sign    Days of Exercise per Week: 2 days    Minutes  of Exercise per Session: 30 min  Stress: No Stress Concern Present (03/26/2023)   [employer] of Occupational Health - Occupational Stress Questionnaire    Feeling of Stress : Not at all  Social Connections: Socially Isolated (03/26/2023)   Social Connection and Isolation Panel [NHANES]    Frequency of Communication with Friends and Family: Three times a week    Frequency of Social Gatherings with Friends and Family: Three times a week    Attends Religious Services: Never    Active Member of Clubs or Organizations: No    Attends Banker Meetings: Never    Marital Status: Divorced  Catering manager Violence: Not At Risk (03/26/2023)   Humiliation, Afraid, Rape, and Kick questionnaire    Fear of Current or Ex-Partner: No    Emotionally Abused: No    Physically Abused: No    Sexually Abused: No    Review of Systems Per HPI.   Objective:   Vitals:   08/20/23 1216  BP: (!) 144/70  Pulse: (!) 57  Temp: 98.4 F (36.9 C)  TempSrc: Temporal  SpO2: 96%  Weight: 196 lb 6.4 oz (89.1 kg)  Height: 5\' 5"  (1.651 m)     Physical Exam Vitals reviewed.  Constitutional:      Appearance: Normal appearance. She is well-developed.  HENT:     Head: Normocephalic and atraumatic.  Eyes:     Conjunctiva/sclera: Conjunctivae normal.     Pupils: Pupils are equal, round, and reactive to light.     Comments: 1-2 beats horizontal nystagmus without dizziness/vertigo.   Neck:     Vascular: No carotid bruit.  Cardiovascular:     Rate and Rhythm: Normal rate and regular rhythm.     Heart sounds: Normal heart sounds.  Pulmonary:     Effort: Pulmonary effort is normal.     Breath sounds: Normal breath sounds.  Abdominal:     Palpations: Abdomen is soft. There is no pulsatile mass.     Tenderness: There is no abdominal tenderness.  Musculoskeletal:     Right lower leg: No edema.     Left lower leg: No edema.  Skin:    General: Skin is warm and dry.  Neurological:     Mental  Status: She is alert and oriented to person, place, and time.  Psychiatric:        Mood and Affect: Mood normal.        Behavior: Behavior normal.        Assessment & Plan:  Elizabeth Lin is a 77 y.o. female . Depression, unspecified depression type - Plan: Ambulatory referral to Psychiatry  -Less daytime sleepiness, may be slight improvement in depression symptoms but still with persistent depression, PHQ as above.  Question if more nausea or timing of nausea but intermittent.  This could be related to Cymbalta.  Option of decreasing back to the 40 mg dose or remain on 60 mg if minimal or improving nausea.  Given persistent symptoms will refer to psychiatry to evaluate for other treatment options.  Also follow-up with therapist should help as well.  Phone number provided.  Meniere disease, left Episode of dizziness  -  Typical episode of Mnire's, vertigo yesterday, now resolved.  Reports intermittent dizziness previously, but asymptomatic at this time.  Orthostatics as above.  Maintenance of hydration throughout the day recommended and RTC/ER precautions if recurrent dizziness.  Has been improving diet, anticipate improved blood pressure as well as lipid control, recheck in 2 months for repeat labs.  No orders of the defined types were placed in this encounter.  Patient Instructions  I do recommend follow up with therapist and I will refer you to psychiatry.  You can stay at same dose of Cymbalta unless more nausea then return to 40 mg dose.  If nausea does not improve with lower dose - see me and we can look at other causes. Let me know if refills need in the meantime.  Here is the number for therapist - call them to schedule appointment: [facility]   [place]   [phone]   Glad to hear that your vertigo form yesterday has resolved.   Return to the clinic or go to the nearest emergency room if any of your symptoms worsen or new symptoms occur.     Signed,    Meredith Staggers, MD [facility], [facility] 08/20/23 1:07 PM

## 2023-08-20 NOTE — Patient Instructions (Addendum)
I do recommend follow up with therapist and I will refer you to psychiatry.  You can stay at same dose of Cymbalta unless more nausea then return to 40 mg dose.  If nausea does not improve with lower dose - see me and we can look at other causes. Let me know if refills need in the meantime.  Here is the number for therapist - call them to schedule appointment: [facility]   [place]   [phone]   Glad to hear that your vertigo form yesterday has resolved.   Return to the clinic or go to the nearest emergency room if any of your symptoms worsen or new symptoms occur.

## 2023-08-21 ENCOUNTER — Other Ambulatory Visit: Payer: Self-pay | Admitting: Family Medicine

## 2023-08-21 DIAGNOSIS — F32A Depression, unspecified: Secondary | ICD-10-CM

## 2023-08-24 ENCOUNTER — Ambulatory Visit: Payer: Medicare PPO | Admitting: Family Medicine

## 2023-09-01 DIAGNOSIS — M6281 Muscle weakness (generalized): Secondary | ICD-10-CM | POA: Diagnosis not present

## 2023-09-01 DIAGNOSIS — R2689 Other abnormalities of gait and mobility: Secondary | ICD-10-CM | POA: Diagnosis not present

## 2023-09-01 DIAGNOSIS — M25652 Stiffness of left hip, not elsewhere classified: Secondary | ICD-10-CM | POA: Diagnosis not present

## 2023-09-01 DIAGNOSIS — M25552 Pain in left hip: Secondary | ICD-10-CM | POA: Diagnosis not present

## 2023-09-03 DIAGNOSIS — M25652 Stiffness of left hip, not elsewhere classified: Secondary | ICD-10-CM | POA: Diagnosis not present

## 2023-09-03 DIAGNOSIS — R2689 Other abnormalities of gait and mobility: Secondary | ICD-10-CM | POA: Diagnosis not present

## 2023-09-03 DIAGNOSIS — M25552 Pain in left hip: Secondary | ICD-10-CM | POA: Diagnosis not present

## 2023-09-03 DIAGNOSIS — M6281 Muscle weakness (generalized): Secondary | ICD-10-CM | POA: Diagnosis not present

## 2023-09-10 DIAGNOSIS — M25552 Pain in left hip: Secondary | ICD-10-CM | POA: Diagnosis not present

## 2023-09-10 DIAGNOSIS — M6281 Muscle weakness (generalized): Secondary | ICD-10-CM | POA: Diagnosis not present

## 2023-09-10 DIAGNOSIS — M25652 Stiffness of left hip, not elsewhere classified: Secondary | ICD-10-CM | POA: Diagnosis not present

## 2023-09-10 DIAGNOSIS — R2689 Other abnormalities of gait and mobility: Secondary | ICD-10-CM | POA: Diagnosis not present

## 2023-09-15 DIAGNOSIS — M6281 Muscle weakness (generalized): Secondary | ICD-10-CM | POA: Diagnosis not present

## 2023-09-15 DIAGNOSIS — M25552 Pain in left hip: Secondary | ICD-10-CM | POA: Diagnosis not present

## 2023-09-15 DIAGNOSIS — R2689 Other abnormalities of gait and mobility: Secondary | ICD-10-CM | POA: Diagnosis not present

## 2023-09-15 DIAGNOSIS — M25652 Stiffness of left hip, not elsewhere classified: Secondary | ICD-10-CM | POA: Diagnosis not present

## 2023-09-17 DIAGNOSIS — M25552 Pain in left hip: Secondary | ICD-10-CM | POA: Diagnosis not present

## 2023-09-17 DIAGNOSIS — R2689 Other abnormalities of gait and mobility: Secondary | ICD-10-CM | POA: Diagnosis not present

## 2023-09-17 DIAGNOSIS — M25652 Stiffness of left hip, not elsewhere classified: Secondary | ICD-10-CM | POA: Diagnosis not present

## 2023-09-17 DIAGNOSIS — M6281 Muscle weakness (generalized): Secondary | ICD-10-CM | POA: Diagnosis not present

## 2023-09-25 NOTE — Telephone Encounter (Signed)
errror

## 2023-10-07 DIAGNOSIS — R2689 Other abnormalities of gait and mobility: Secondary | ICD-10-CM | POA: Diagnosis not present

## 2023-10-07 DIAGNOSIS — M6281 Muscle weakness (generalized): Secondary | ICD-10-CM | POA: Diagnosis not present

## 2023-10-07 DIAGNOSIS — M25552 Pain in left hip: Secondary | ICD-10-CM | POA: Diagnosis not present

## 2023-10-07 DIAGNOSIS — M25652 Stiffness of left hip, not elsewhere classified: Secondary | ICD-10-CM | POA: Diagnosis not present

## 2023-10-14 DIAGNOSIS — M25652 Stiffness of left hip, not elsewhere classified: Secondary | ICD-10-CM | POA: Diagnosis not present

## 2023-10-14 DIAGNOSIS — M25552 Pain in left hip: Secondary | ICD-10-CM | POA: Diagnosis not present

## 2023-10-14 DIAGNOSIS — R2689 Other abnormalities of gait and mobility: Secondary | ICD-10-CM | POA: Diagnosis not present

## 2023-10-14 DIAGNOSIS — M6281 Muscle weakness (generalized): Secondary | ICD-10-CM | POA: Diagnosis not present

## 2023-10-19 ENCOUNTER — Ambulatory Visit: Payer: Medicare PPO | Admitting: Podiatrist

## 2023-10-20 ENCOUNTER — Encounter: Payer: Self-pay | Admitting: Podiatry

## 2023-10-20 ENCOUNTER — Ambulatory Visit: Payer: Medicare PPO | Admitting: Podiatry

## 2023-10-20 ENCOUNTER — Ambulatory Visit (INDEPENDENT_AMBULATORY_CARE_PROVIDER_SITE_OTHER): Payer: Medicare PPO

## 2023-10-20 VITALS — Ht 65.0 in | Wt 196.0 lb

## 2023-10-20 DIAGNOSIS — M19071 Primary osteoarthritis, right ankle and foot: Secondary | ICD-10-CM | POA: Diagnosis not present

## 2023-10-20 DIAGNOSIS — B351 Tinea unguium: Secondary | ICD-10-CM

## 2023-10-20 MED ORDER — CICLOPIROX 8 % EX SOLN: Freq: Every day | CUTANEOUS | 11 refills | Status: AC

## 2023-10-20 NOTE — Progress Notes (Unsigned)
Chief Complaint  Patient presents with   Foot Pain    Right foot, Metaris, top of right foot is in pain/fungus on both feet(big toe)   HPI: 78 y.o. female presents today with concern of pain to the dorsal aspect of the right midfoot.  Denies injury.  States that it has been ongoing for a little while but is getting worse.  Denies any significant redness or swelling.  She also has secondary concern of possible fungus in both great toes.  Past Medical History:  Diagnosis Date   Allergy    Anxiety    Arthritis    Carpal tunnel syndrome    right hand, getting injections   Cataract    Depression    Dysrhythmia    BBB   Hammer toe    Hypothyroidism due to Hashimoto's thyroiditis    Meniere disease, right     Past Surgical History:  Procedure Laterality Date   APPENDECTOMY     COLONOSCOPY     CYSTECTOMY     eye   LAPAROSCOPIC ASSISTED SPIGELIAN HERNIA REPAIR N/A 11/08/2019   Procedure: LAPAROSCOPIC ASSISTED SPIGELIAN HERNIA REPAIR WITH MESH;  Surgeon: Manus Rudd, MD;  Location: [facility];  Service: General;  Laterality: N/A;   OOPHORECTOMY Right    TONSILLECTOMY     TRANSTHORACIC ECHOCARDIOGRAM  12/09/2019   EF 60 to 65%.  No R WMA.  GR 1 DD.  Normal RV function.  Aortic calcification with sclerosis but no suggestion of stenosis.  (Mean gradient 8 mmHg)    Allergies  Allergen Reactions   Penicillins Rash    Did it involve swelling of the face/tongue/throat, SOB, or low BP? No Did it involve sudden or severe rash/hives, skin peeling, or any reaction on the inside of your mouth or nose? Yes Did you need to seek medical attention at a hospital or doctor's office? No When did it last happen?  ~6-10 years ago     If all above answers are "NO", may proceed with cephalosporin use.    Amoxicillin-Pot Clavulanate     Red rash Did it involve swelling of the face/tongue/throat, SOB, or low BP? No Did it involve sudden or severe rash/hives, skin peeling, or any reaction on the  inside of your mouth or nose? Yes Did you need to seek medical attention at a hospital or doctor's office? No When did it last happen? ~6-10 years ago    If all above answers are "NO", may proceed with cephalosporin use.    Sulfa Antibiotics     Review of Systems  Musculoskeletal:  Positive for joint pain.      Physical Exam: Palpable pedal pulses are noted.  The hallux nails shows some yellow discoloration, subungual debris, distal onycholysis and pain with compression consistent with onychomycosis.  There is pain on palpation to the dorsal aspect of the right second and third metatarsal-cuneiform joints.  There are small bony palpable prominences in this area consistent with spurs.  Epicritic sensation is intact.  Radiographic Exam (right foot, 3 weightbearing views, 10/20/2023):  Normal osseous mineralization.  There is significant joint space narrowing at the second and third metatarsal-cuneiform joints.  There is some dorsal spurring/degenerative joint changes seen on lateral view.  There is subchondral sclerosis at these joints.  There is metatarsus adductus noted metatarsals 1 2 and 3.  There is a bipartite tibial sesamoid.  There is lateral angulation of the second and third toes at the level of the metatarsal phalangeal joint.  There  is a posterior calcaneal spur noted  Assessment/Plan of Care: 1. Arthritis of right midfoot   2. Dermatophytosis of nail      Meds ordered this encounter  Medications   ciclopirox (PENLAC) 8 % solution    Sig: Apply topically at bedtime. Apply over nail. Apply daily over previous coat. Remove weekly with polish remover.    Dispense:  6.6 mL    Refill:  11   Discussed clinical and radiographic findings with patient today.  Offered a corticosteroid injection to the dorsal aspect of the right second and third metatarsal-cuneiform joints.  Patient declined at this time and would prefer to try the Voltaren gel.  She was instructed to apply this 2-3  times a day and take at least 5 minutes to massage this and very well.  Avoid any additional shoe pressure digging into the dorsal midfoot.  Some padding was added to her right orthotic to see if this helps support the midfoot better as well.  Prescription for ciclopirox 8% solution was sent to her pharmacy.  She can apply this to the hallux toenails once daily.  She will need to remove this once weekly with rubbing alcohol or nail polish remover.  Recommend recheck of the toenail fungus in 3-4 months.   Clerance Lav, DPM, FACFAS [facility]     [address] [place], [place] [address]                Office [phone]  Fax [phone]

## 2023-10-21 DIAGNOSIS — M25552 Pain in left hip: Secondary | ICD-10-CM | POA: Diagnosis not present

## 2023-10-21 DIAGNOSIS — R2689 Other abnormalities of gait and mobility: Secondary | ICD-10-CM | POA: Diagnosis not present

## 2023-10-21 DIAGNOSIS — M6281 Muscle weakness (generalized): Secondary | ICD-10-CM | POA: Diagnosis not present

## 2023-10-21 DIAGNOSIS — M25652 Stiffness of left hip, not elsewhere classified: Secondary | ICD-10-CM | POA: Diagnosis not present

## 2023-10-22 ENCOUNTER — Ambulatory Visit: Payer: Medicare PPO | Admitting: Family Medicine

## 2023-10-28 DIAGNOSIS — M25652 Stiffness of left hip, not elsewhere classified: Secondary | ICD-10-CM | POA: Diagnosis not present

## 2023-10-28 DIAGNOSIS — M25552 Pain in left hip: Secondary | ICD-10-CM | POA: Diagnosis not present

## 2023-10-28 DIAGNOSIS — R2689 Other abnormalities of gait and mobility: Secondary | ICD-10-CM | POA: Diagnosis not present

## 2023-10-28 DIAGNOSIS — M6281 Muscle weakness (generalized): Secondary | ICD-10-CM | POA: Diagnosis not present

## 2023-10-28 DIAGNOSIS — M25511 Pain in right shoulder: Secondary | ICD-10-CM | POA: Diagnosis not present

## 2023-10-28 DIAGNOSIS — M19011 Primary osteoarthritis, right shoulder: Secondary | ICD-10-CM | POA: Diagnosis not present

## 2023-11-02 DIAGNOSIS — R2689 Other abnormalities of gait and mobility: Secondary | ICD-10-CM | POA: Diagnosis not present

## 2023-11-02 DIAGNOSIS — M25652 Stiffness of left hip, not elsewhere classified: Secondary | ICD-10-CM | POA: Diagnosis not present

## 2023-11-02 DIAGNOSIS — M6281 Muscle weakness (generalized): Secondary | ICD-10-CM | POA: Diagnosis not present

## 2023-11-02 DIAGNOSIS — M25552 Pain in left hip: Secondary | ICD-10-CM | POA: Diagnosis not present

## 2023-11-06 DIAGNOSIS — M6281 Muscle weakness (generalized): Secondary | ICD-10-CM | POA: Diagnosis not present

## 2023-11-06 DIAGNOSIS — M25652 Stiffness of left hip, not elsewhere classified: Secondary | ICD-10-CM | POA: Diagnosis not present

## 2023-11-06 DIAGNOSIS — M25552 Pain in left hip: Secondary | ICD-10-CM | POA: Diagnosis not present

## 2023-11-06 DIAGNOSIS — R2689 Other abnormalities of gait and mobility: Secondary | ICD-10-CM | POA: Diagnosis not present

## 2023-11-11 IMAGING — CT CT CARDIAC CORONARY ARTERY CALCIUM SCORE
3 series · 14 of 20 positions shown, 16 images · non-contrast
Comparison: Coronary calcium score dated November 18, 2017.
COMPARISON: Coronary calcium score dated November 18, 2017.

Addendum:
CLINICAL DATA: This over-read does not include interpretation of
cardiac or coronary anatomy or pathology. The coronary calcium score
interpretation by the cardiologist is attached.
TECHNIQUE: A gated, non-contrast computed tomography scan of the heart was
performed using 3mm slice thickness. Axial images were analyzed on a
dedicated workstation. Calcium scoring of the coronary arteries was
performed using the Agatston method.

[Series 2: ax lung · axial · 0.77mm/px · z∈[+281,+371]mm · 5 of 69 slices shown]
[im 12/69  lung]
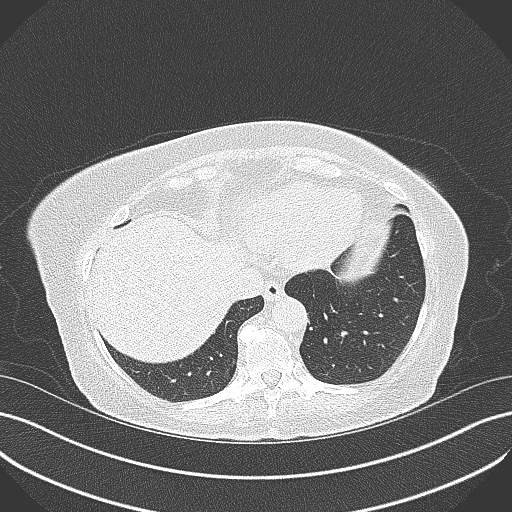
[im 23/69  lung]
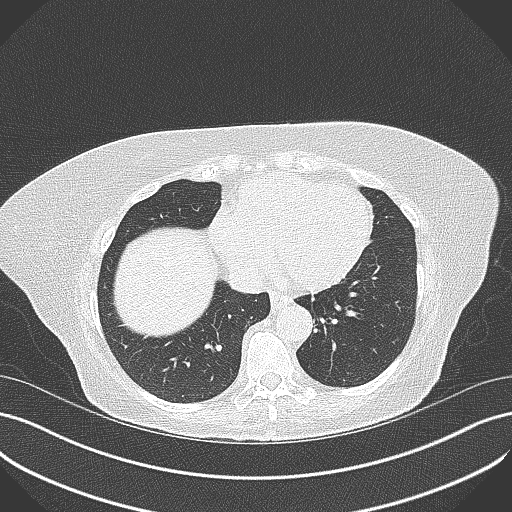
[im 35/69  lung]
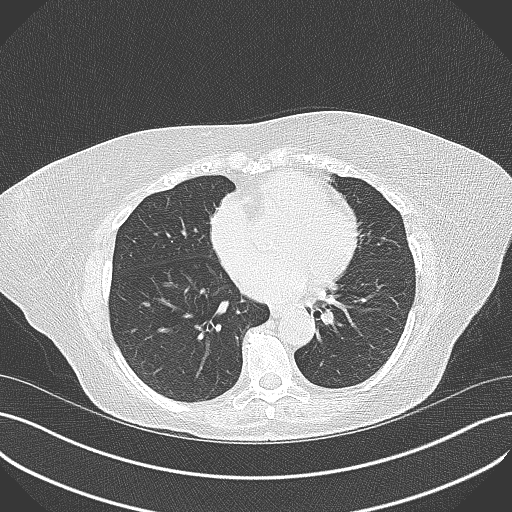
[im 46/69  lung]
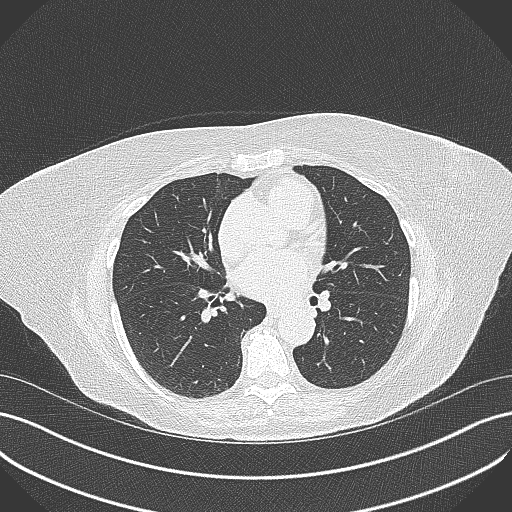
[im 57/69  lung]
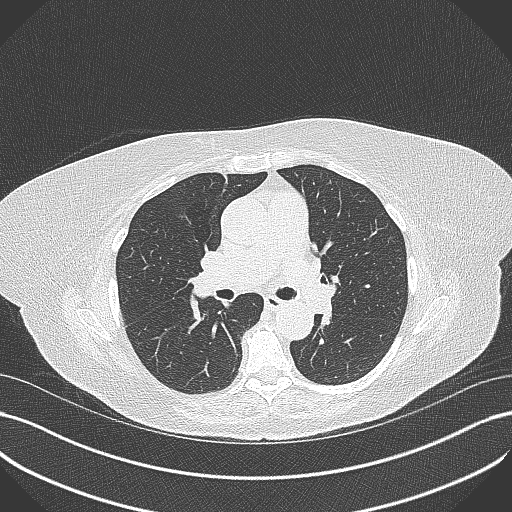

[Series 3: cascseq 3.0 sa36 70% (id) · axial · 0.39mm/px · z∈[+293,+359]mm · 3 of 46 slices shown]
[im 12/46  vessel]
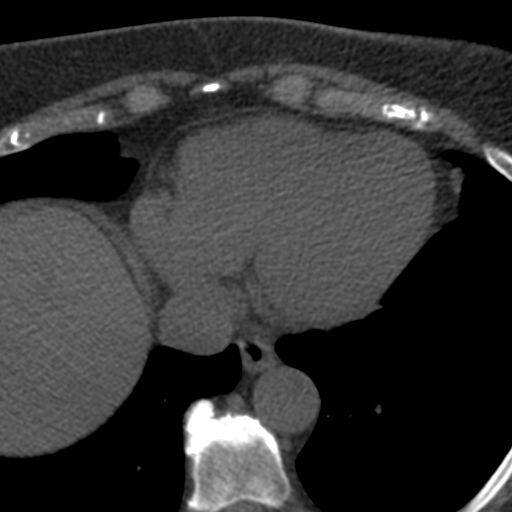
[im 23/46  vessel]
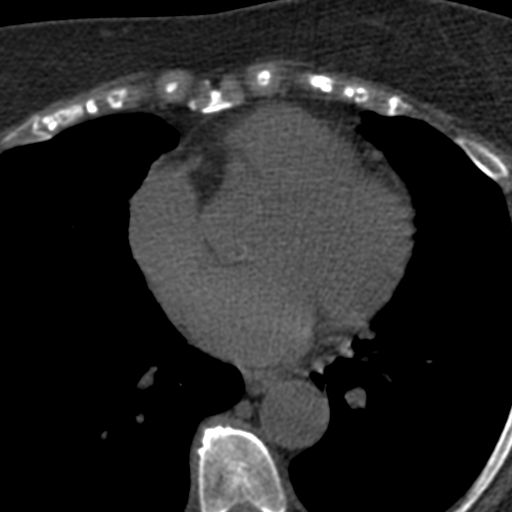
[im 34/46  vessel]
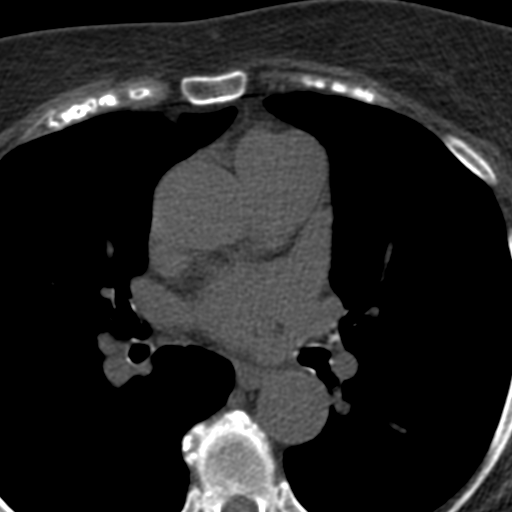

[Series 4: ax st · axial · 0.64mm/px · z∈[+277,+375]mm · 6 of 69 slices shown, 8 images]
[im 10/69  vessel]
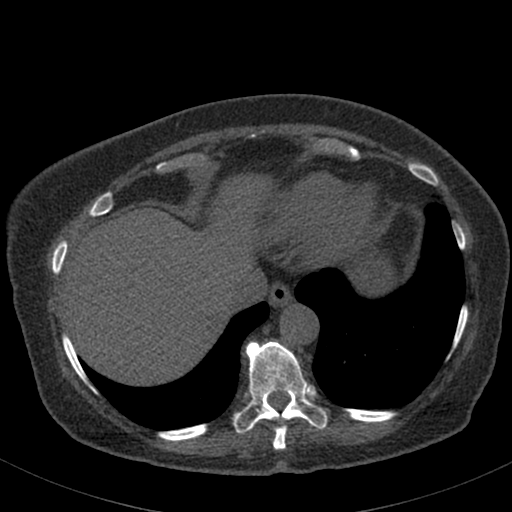
[im 10/69  lung]
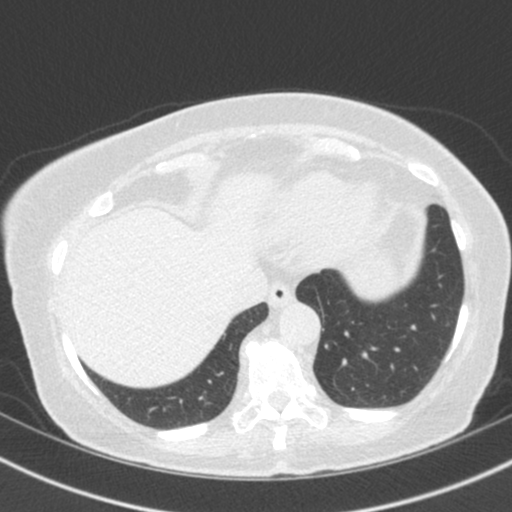
[im 20/69  vessel]
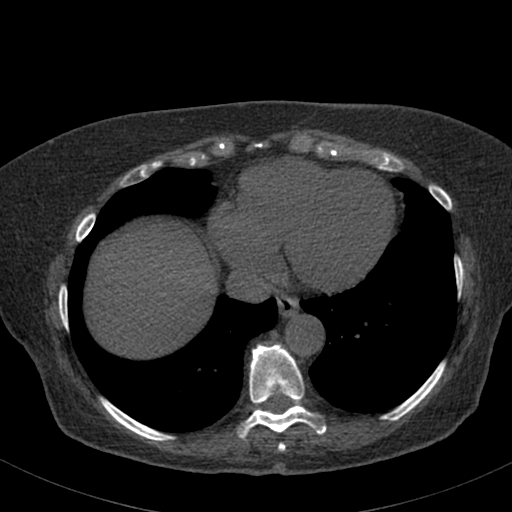
[im 30/69  vessel]
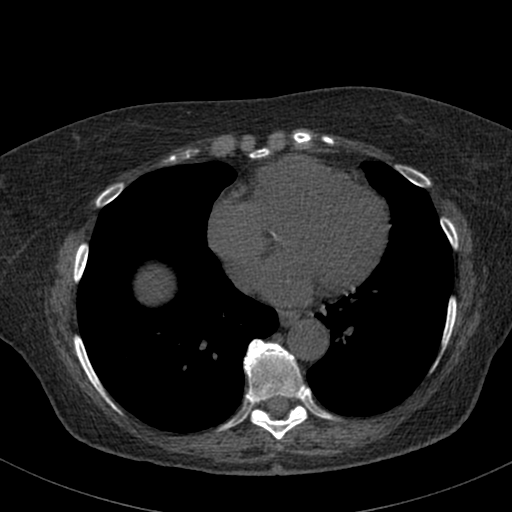
[im 39/69  vessel]
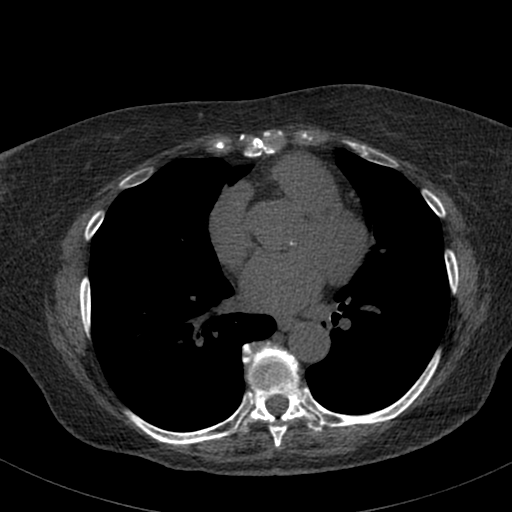
[im 49/69  vessel]
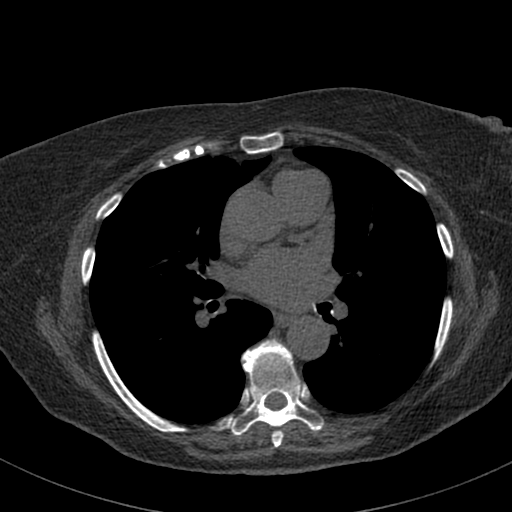
[im 49/69  lung]
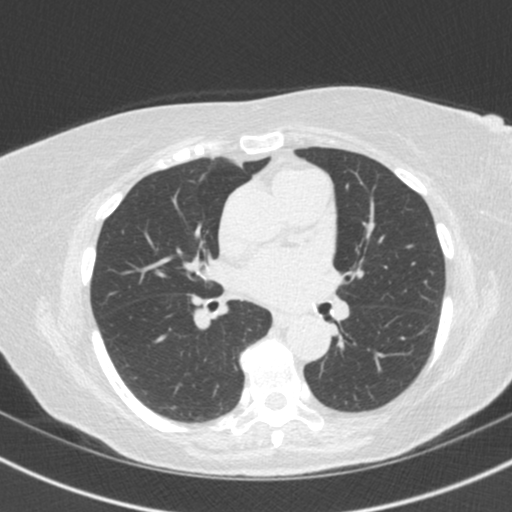
[im 59/69  vessel]
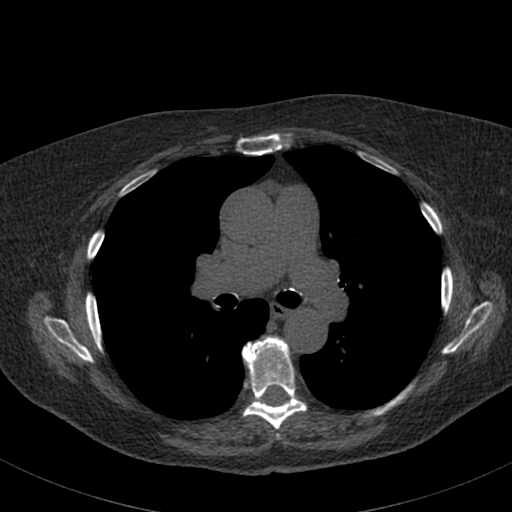

[14 of 20 positions shown; findings below may reference images not displayed]

FINDINGS: Cardiovascular: There are no significant vascular findings. There is
no pericardial effusion.

Mediastinum/Nodes: There are no enlarged lymph nodes.The visualized
esophagus demonstrates no significant findings.

Lungs/Pleura: Unchanged minimal scarring in the right middle lobe.
No consolidation, pleural effusion, or pneumothorax. No suspicious
pulmonary nodule.

Upper abdomen: No acute abnormality.

Musculoskeletal/Chest wall: No chest wall abnormality. No acute or
significant osseous findings.
IMPRESSION: 1. No significant non-cardiac findings.

ADDENDUM:
Cardiovascular Disease Risk stratification

EXAM:
Coronary Calcium Score
FINDINGS: Coronary arteries: Normal origins.

Coronary Calcium Score:

Left main: 0

Left anterior descending artery: 0

Left circumflex artery: 0

Right coronary artery: 0

Total: 0

Percentile: 0

Pericardium: Normal.

Ascending Aorta: Normal caliber.

Non-cardiac: See separate report from [REDACTED].
IMPRESSION: Coronary calcium score of 0. This was 0 percentile for age-, race-,
and sex-matched controls.



If CAC=0, it is reasonable to withhold statin therapy and reassess
in 5 to 10 years, as long as higher risk conditions are absent
(diabetes mellitus, family history of premature CHD in first degree
relatives (males <55 years; females <65 years), cigarette smoking,
or LDL >=190 mg/dL).

If CAC is 1 to 99, it is reasonable to initiate statin therapy for
patients >=55 years of age.

If CAC is >=100 or >=75th percentile, it is reasonable to initiate
statin therapy at any age.

Cardiology referral should be considered for patients with CAC
scores >=400 or >=75th percentile.

*3782 AHA/ACC/AACVPR/AAPA/ABC/BABARINDE/PAULUS N/SQUEALING/Avhurengwi/LOCKLEAR/OLIVIA/LOU
Guideline on the Management of Blood Cholesterol: A Report of the
American College of Cardiology/American Heart Association Task Force
on Clinical Practice Guidelines. J Am Coll Cardiol.
6666;73(24):2689-2227.

Arissa Billiot, DO [REDACTED]

The noncardiac portion of this study will be interpreted in separate
report by the radiologist.

*** End of Addendum ***
FINDINGS: Cardiovascular: There are no significant vascular findings. There is
no pericardial effusion.

Mediastinum/Nodes: There are no enlarged lymph nodes.The visualized
esophagus demonstrates no significant findings.

Lungs/Pleura: Unchanged minimal scarring in the right middle lobe.
No consolidation, pleural effusion, or pneumothorax. No suspicious
pulmonary nodule.

Upper abdomen: No acute abnormality.

Musculoskeletal/Chest wall: No chest wall abnormality. No acute or
significant osseous findings.
IMPRESSION: 1. No significant non-cardiac findings.

## 2023-12-04 ENCOUNTER — Other Ambulatory Visit: Payer: Medicare PPO

## 2024-01-21 ENCOUNTER — Other Ambulatory Visit: Payer: Self-pay | Admitting: Family Medicine

## 2024-01-21 DIAGNOSIS — I10 Essential (primary) hypertension: Secondary | ICD-10-CM

## 2024-02-16 ENCOUNTER — Ambulatory Visit: Payer: Medicare PPO | Admitting: Podiatry

## 2024-02-19 ENCOUNTER — Ambulatory Visit

## 2024-02-29 ENCOUNTER — Ambulatory Visit: Admitting: Podiatry

## 2024-02-29 ENCOUNTER — Telehealth: Payer: Self-pay | Admitting: Podiatry

## 2024-02-29 NOTE — Telephone Encounter (Signed)
 Patient came in late into the appointment and rescheduled. She wanted to let you know that she did get fitted for ankle brace by Kerney Pee and she enjoys it a lot and does not have as much pain. She states the brace is helping her walk and feel more stable/ slightly more confident.

## 2024-03-08 DIAGNOSIS — H903 Sensorineural hearing loss, bilateral: Secondary | ICD-10-CM | POA: Diagnosis not present

## 2024-03-17 NOTE — Progress Notes (Signed)
 na

## 2024-03-21 ENCOUNTER — Encounter: Admitting: Podiatry

## 2024-03-22 ENCOUNTER — Other Ambulatory Visit: Payer: Self-pay | Admitting: Family Medicine

## 2024-03-22 DIAGNOSIS — I10 Essential (primary) hypertension: Secondary | ICD-10-CM

## 2024-03-22 DIAGNOSIS — E785 Hyperlipidemia, unspecified: Secondary | ICD-10-CM

## 2024-03-23 NOTE — Progress Notes (Signed)
 Patient did not show for scheduled appointment on 03/21/24.

## 2024-03-28 ENCOUNTER — Other Ambulatory Visit: Payer: Self-pay | Admitting: Family Medicine

## 2024-03-28 DIAGNOSIS — E785 Hyperlipidemia, unspecified: Secondary | ICD-10-CM

## 2024-04-07 ENCOUNTER — Telehealth: Payer: Self-pay

## 2024-04-07 NOTE — Telephone Encounter (Signed)
 Acute visit planned with me tomorrow morning, can discuss further at that time as potentially may need labs with that visit.

## 2024-04-07 NOTE — Telephone Encounter (Signed)
 Okay to do labs prior to physical

## 2024-04-07 NOTE — Telephone Encounter (Signed)
 Copied from CRM [id number]. Topic: Clinical - Request for Lab/Test Order >> Apr 07, 2024  1:12 PM Macario HERO wrote: Reason for CRM: Patient requesting labs prior to her physical on 06/01/24. Please call to schedule.

## 2024-04-08 ENCOUNTER — Ambulatory Visit: Admitting: Family Medicine

## 2024-04-08 ENCOUNTER — Ambulatory Visit (INDEPENDENT_AMBULATORY_CARE_PROVIDER_SITE_OTHER)
Admission: RE | Admit: 2024-04-08 | Discharge: 2024-04-08 | Disposition: A | Discharge status: Home / Self Care | Source: Ambulatory Visit | Attending: Family Medicine | Admitting: Family Medicine

## 2024-04-08 ENCOUNTER — Ambulatory Visit: Payer: Self-pay | Admitting: Family Medicine

## 2024-04-08 ENCOUNTER — Telehealth: Payer: Self-pay

## 2024-04-08 ENCOUNTER — Encounter: Payer: Self-pay | Admitting: Family Medicine

## 2024-04-08 VITALS — BP 98/62 | HR 52 | Temp 97.7°F | Ht 65.0 in | Wt 202.1 lb

## 2024-04-08 DIAGNOSIS — R001 Bradycardia, unspecified: Secondary | ICD-10-CM

## 2024-04-08 DIAGNOSIS — I10 Essential (primary) hypertension: Secondary | ICD-10-CM | POA: Diagnosis not present

## 2024-04-08 DIAGNOSIS — R42 Dizziness and giddiness: Secondary | ICD-10-CM

## 2024-04-08 DIAGNOSIS — Z96612 Presence of left artificial shoulder joint: Secondary | ICD-10-CM | POA: Diagnosis not present

## 2024-04-08 DIAGNOSIS — R0600 Dyspnea, unspecified: Secondary | ICD-10-CM | POA: Diagnosis not present

## 2024-04-08 DIAGNOSIS — E039 Hypothyroidism, unspecified: Secondary | ICD-10-CM | POA: Diagnosis not present

## 2024-04-08 LAB — TSH: TSH: 1.58 u[IU]/mL (ref 0.35–5.50)

## 2024-04-08 LAB — COMPREHENSIVE METABOLIC PANEL WITH GFR
ALT: 19 U/L (ref 0–35)
AST: 23 U/L (ref 0–37)
Albumin: 4.2 g/dL (ref 3.5–5.2)
Alkaline Phosphatase: 81 U/L (ref 39–117)
BUN: 24 mg/dL — ABNORMAL HIGH (ref 6–23)
CO2: 34 meq/L — ABNORMAL HIGH (ref 19–32)
Calcium: 9.6 mg/dL (ref 8.4–10.5)
Chloride: 97 meq/L (ref 96–112)
Creatinine, Ser: 0.77 mg/dL (ref 0.40–1.20)
GFR: 73.82 mL/min (ref >60.00)
Glucose, Bld: 89 mg/dL (ref 70–99)
Potassium: 3.7 meq/L (ref 3.5–5.1)
Sodium: 137 meq/L (ref 135–145)
Total Bilirubin: 0.6 mg/dL (ref 0.2–1.2)
Total Protein: 7.3 g/dL (ref 6.0–8.3)

## 2024-04-08 LAB — BRAIN NATRIURETIC PEPTIDE: Pro B Natriuretic peptide (BNP): 55 pg/mL (ref 0.0–100.0)

## 2024-04-08 LAB — CBC
HCT: 42 % (ref 36.0–46.0)
Hemoglobin: 13.9 g/dL (ref 12.0–15.0)
MCHC: 33.1 g/dL (ref 30.0–36.0)
MCV: 85.6 fl (ref 78.0–100.0)
Platelets: 305 K/uL (ref 150.0–400.0)
RBC: 4.9 Mil/uL (ref 3.87–5.11)
RDW: 14.4 % (ref 11.5–15.5)
WBC: 6.1 K/uL (ref 4.0–10.5)

## 2024-04-08 MED ORDER — HYDROCHLOROTHIAZIDE 12.5 MG PO TABS: 12.5000 mg | ORAL_TABLET | Freq: Every day | ORAL | 1 refills | Status: DC

## 2024-04-08 MED ORDER — MECLIZINE HCL 25 MG PO TABS: 25.0000 mg | ORAL_TABLET | Freq: Three times a day (TID) | ORAL | 0 refills | Status: AC | PRN

## 2024-04-08 NOTE — Progress Notes (Signed)
 Subjective:  Patient ID: Elizabeth Lin, female    DOB: [birth year]  Age: 78 y.o. MRN: [id number]  CC:  Chief Complaint  Patient presents with   Dizziness    Pt is here with C/O light headed for months. Sx - sitting,standing,movement of her head. Pt reports when she is sitting over she has a whistle sound - sx started months ago    HPI Elizabeth Lin presents for   Dizziness: Lightheaded/dizzy symptoms past few months.  Notices with sitting to standing and head movement. Lightheadedness all the time, not just with ead movement.  Nausea, no vomiting.  Sometimes short of breath at end of walk. Whistling sound in breathing out at times leaning forward, not with walking. Eating/drinking ok, no choking, swallowing liquids/food ok. Prior smoker.  No melena/hematochezia.  No palpitations/chest pain. She does have history of right bundle branch block, and history of Mnire's disease. No ear symptoms or tinnitus.  No headache or focal weakness. Pressure in back of head at times. Same time as dizziness.  On hydrochlorothiazide  25mg  every day - for menieres,  Synthroid  150mcg every day.     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   Sensorineural hearing loss (SNHL), bilateral 04/27/2019   Meniere disease, left 12/12/2017   Mild aortic stenosis 11/10/2017   Bilateral edema of lower extremity 11/10/2017   Family history of premature CAD 11/10/2017   Arthralgia of multiple joints 10/23/2017   Class 1 obesity due to excess calories without serious comorbidity with body mass index (BMI) of 32.0 to 32.9 in adult 10/23/2017    Essential hypertension 10/23/2017   Right bundle branch block (RBBB) 10/23/2017   Pure hypercholesterolemia 10/23/2017   Vitamin D  deficiency 09/06/2014   Right knee pain 02/24/2013   Hypothyroid 11/02/2011   Depression 11/02/2011   History of positive PPD 11/02/2011   Past Medical History:  Diagnosis Date   Allergy    Anxiety    Arthritis    Carpal tunnel syndrome    right hand, getting injections   Cataract    Depression    Dysrhythmia    BBB   Hammer toe    Hypothyroidism due to Hashimoto's thyroiditis    Meniere disease, right    Past Surgical History:  Procedure Laterality Date   APPENDECTOMY     COLONOSCOPY     CYSTECTOMY     eye   LAPAROSCOPIC ASSISTED SPIGELIAN HERNIA REPAIR N/A 11/08/2019   Procedure: LAPAROSCOPIC ASSISTED SPIGELIAN HERNIA REPAIR WITH MESH;  Surgeon: Belinda Cough, MD;  Location: [facility];  Service: General;  Laterality: N/A;   OOPHORECTOMY Right    TONSILLECTOMY     TRANSTHORACIC ECHOCARDIOGRAM  12/09/2019   EF 60 to 65%.  No R WMA.  GR 1 DD.  Normal RV function.  Aortic calcification with sclerosis but no suggestion of stenosis.  (Mean gradient 8 mmHg)   Allergies  Allergen Reactions   Penicillins Rash    Did it involve swelling of the face/tongue/throat, SOB, or low BP? No Did it involve sudden or severe rash/hives, skin peeling, or any reaction on the inside of your mouth or nose?  Yes Did you need to seek medical attention at a hospital or doctor's office? No When did it last happen?  ~6-10 years ago     If all above answers are NO, may proceed with cephalosporin use.    Amoxicillin-Pot Clavulanate     Red rash Did it involve swelling of the face/tongue/throat, SOB, or low BP? No Did it involve sudden or severe rash/hives, skin peeling, or any reaction on the inside of your mouth or nose? Yes Did you need to seek medical attention at a hospital or doctor's office? No When did it last happen? ~6-10 years ago    If all above answers  are "NO", may proceed with cephalosporin use.    Sulfa Antibiotics    Prior to Admission medications   Medication Sig Start Date End Date Taking? Authorizing Provider  acetaminophen  (TYLENOL ) 500 MG tablet Take by mouth.   Yes [provider]  ascorbic acid (VITAMIN C) 1000 MG tablet Take by mouth.   Yes [provider]  atorvastatin  (LIPITOR) 10 MG tablet 1 Tablet by mouth FOUR times a week 03/28/24  Yes Levora Reyes SAUNDERS, MD  Calcium -Vitamin D -Vitamin K (CALCIUM  + D + K PO) Take 1 tablet by mouth 2 (two) times daily. 1300 mg+D1000 IU   Yes [provider]  ciclopirox  (PENLAC ) 8 % solution Apply topically at bedtime. Apply over nail. Apply daily over previous coat. Remove weekly with polish remover. 10/20/23  Yes McCaughan, Dia D, DPM  DULoxetine  (CYMBALTA ) 20 MG capsule TAKE 2 CAPSULES BY MOUTH EVERY DAY 08/21/23  Yes Levora Reyes SAUNDERS, MD  DULoxetine  (CYMBALTA ) 60 MG capsule Take 1 capsule (60 mg total) by mouth daily. 07/24/23  Yes Levora Reyes SAUNDERS, MD  fish oil-omega-3 fatty acids 1000 MG capsule Take 1 g by mouth 2 (two) times daily.    Yes [provider]  hydrochlorothiazide  (HYDRODIURIL ) 25 MG tablet TAKE 1 TABLET (25 MG TOTAL) BY MOUTH DAILY. 03/22/24  Yes Levora Reyes SAUNDERS, MD  ibuprofen (ADVIL) 200 MG tablet Take by mouth.   Yes [provider]  ketoconazole  (NIZORAL ) 2 % shampoo APPLY 1 APPLICATION TOPICALLY ONCE A WEEK. 10/27/22  Yes Levora Reyes SAUNDERS, MD  levothyroxine  (SYNTHROID ) 150 MCG tablet TAKE 1 TABLET BY MOUTH EVERY DAY 07/23/23  Yes Levora Reyes SAUNDERS, MD  naproxen  sodium (ALEVE ) 220 MG tablet Take 220-440 mg by mouth 2 (two) times daily as needed (pain/soreness).   Yes [provider]  ofloxacin (OCUFLOX) 0.3 % ophthalmic solution Apply to eye.   Yes [provider]  Polyethyl Glycol-Propyl Glycol (LUBRICANT EYE DROPS) 0.4-0.3 % SOLN Place 1 drop into both eyes 3 (three) times daily as needed (dry/irritated  eyes.). Rohto Ice Multi-Symptom Relief   Yes [provider]  Vitamin E (VITAMIN E/D-ALPHA NATURAL) 268 MG (400 UNIT) CAPS Take by mouth.   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  3 children, 3 grandchildren.   Patient's  Education: [employer].  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   Patient is right handed   Caffeine consumption 2-3 daily    Walks daily for roughly 30 minutes at a time.  She has not been doing it in the wintertime due to cold weather.      Social Drivers of Corporate investment banker Strain: Low Risk  (03/26/2023)   Overall Financial Resource Strain (CARDIA)    Difficulty of Paying Living Expenses: Not hard at all  Food Insecurity: Low Risk  (06/16/2023)   Received from [facility]   Hunger Vital Sign    Within the past 12 months, you worried that your food would run out before you got money to buy more: Never true    Within the past 12 months, the food you bought just didn't last and you didn't have money to get more. : Never true  Transportation Needs: No Transportation Needs (06/16/2023)   Received from [employer] [facility]    In the past 12 months, has lack of reliable transportation kept you from medical appointments, meetings, work or from getting things needed for daily living? : No  Physical Activity: Insufficiently Active (03/26/2023)   Exercise Vital Sign    Days of Exercise per Week: 2 days    Minutes of Exercise per Session: 30 min  Stress: No Stress Concern Present (03/26/2023)   [employer] of Occupational Health - Occupational Stress Questionnaire    Feeling of Stress : Not at all  Social Connections: Socially Isolated (03/26/2023)   Social Connection and Isolation Panel    Frequency of Communication with Friends and Family: Three times a week    Frequency of Social Gatherings with Friends and Family: Three times a week    Attends Religious Services: Never    Active Member of Clubs or Organizations: No    Attends Banker Meetings: Never    Marital Status: Divorced  Catering manager Violence: Not At Risk (03/26/2023)   Humiliation, Afraid, Rape, and Kick questionnaire    Fear of Current or Ex-Partner: No    Emotionally Abused: No    Physically Abused: No    Sexually Abused: No    Review of Systems   Objective:   Vitals:   04/08/24 0814 04/08/24 0840 04/08/24 0841 04/08/24 0842  BP: 104/70  108/68 98/62  Pulse: (!) 59  (!) 57 (!) 52  Temp: 97.7 F (36.5 C)     SpO2:  93% 97% 98%  Weight: 202 lb 2 oz (91.7 kg)     Height: 5' 5 (1.651 m)        Physical Exam Vitals reviewed.  Constitutional:      Appearance: Normal appearance. She is well-developed.  HENT:     Head: Normocephalic and atraumatic.  Eyes:     Conjunctiva/sclera: Conjunctivae normal.     Pupils: Pupils are equal, round, and reactive to light.     Comments: No nystagmus appreciated.  No change in symptoms with EOM testing.  Neck:     Vascular: No carotid bruit.  Cardiovascular:  Rate and Rhythm: Normal rate and regular rhythm.     Heart sounds: Normal heart sounds.   Pulmonary:     Effort: Pulmonary effort is normal.     Breath sounds: Normal breath sounds. No stridor. No wheezing or rales.     Comments: Normal effort, no distress, no wheeze, no stridor, speaking full sentences. Abdominal:     Palpations: Abdomen is soft. There is no pulsatile mass.     Tenderness: There is no abdominal tenderness.  Musculoskeletal:     Right lower leg: No edema.     Left lower leg: No edema.  Skin:    General: Skin is warm and dry.  Neurological:     Mental Status: She is alert and oriented to person, place, and time.     Comments: Nonfocal, no pronator drift, normal finger-nose, rapid alternating movements and heel-to-shin.  Equal upper and lower extremity strength, no focal weakness.  Psychiatric:        Mood and Affect: Mood normal.        Behavior: Behavior normal.      EKG: Right bundle branch block.  Unusual P axis, possible atopic atrial bradycardia.  Rate 55.  PR 204, QTc 434.  Other than change in P axis, no apparent significant changes from EKG in June 2023.  40 minutes spent during visit, including chart review, discussion of dizziness, multiple contributing potential causes and other symptoms above, counseling and assimilation of information, exam, review of labs, discussion of plan, and chart completion.  Time was exclusive of EKG interpretation.   Assessment & Plan:  Elizabeth Lin is a 78 y.o. female . Lightheaded - Plan: EKG 12-Lead, Orthostatic vital signs, Ambulatory referral to Cardiology Dizziness - Plan: CBC, Comprehensive metabolic panel with GFR, EKG 12-Lead, Orthostatic vital signs, meclizine  (ANTIVERT ) 25 MG tablet, Ambulatory referral to Cardiology Dyspnea, unspecified type - Plan: DG Chest 2 View, B Nat Peptide, hydrochlorothiazide  (HYDRODIURIL ) 12.5 MG tablet Hypothyroidism, unspecified type - Plan: TSH Essential hypertension - Plan: EKG 12-Lead, hydrochlorothiazide  (HYDRODIURIL ) 12.5 MG tablet Bradycardia - Plan: Ambulatory  referral to Cardiology  Longstanding lightheadedness for months as above without acute change in symptoms.  Slight dyspnea at the end of activity, not at rest.  Lungs were clear.  No wheeze auscultated.  Bradycardic but has been bradycardic in the past, history of right bundle branch block with minimal changes on EKG in office.   Persistent right bundle branch block, bradycardia.  No nystagmus and does not give typical vertigo presentation of lightheadedness/dizziness without room spinning sensation.  Some lower BP readings and orthostatics but no change in heart rate and slight drop in BP.  Occasional pressure in the head but no new headaches, and nonfocal neurologic exam in office.  CBC, TSH, CMP overall reassuring and chest x-ray was clear.  Possible multifactorial cause of dizziness with her history of Mnire's but less vertigo type symptoms as above.  Bradycardia may be contributing, overtreatment of blood pressure may be contributing.  Will refer to cardiology, initial outpatient treatment given chronicity of symptoms with ER precautions given.  Decrease hydrochlorothiazide  to half dose for now, meclizine  provided if needed.  Maintenance of hydration discussed.  Recheck in next 1 to 2 weeks.  ER precautions as above, and noted on her lab result note as well. Meds ordered this encounter  Medications   hydrochlorothiazide  (HYDRODIURIL ) 12.5 MG tablet    Sig: Take 1 tablet (12.5 mg total) by mouth daily.    Dispense:  90 tablet  Refill:  1   meclizine  (ANTIVERT ) 25 MG tablet    Sig: Take 1 tablet (25 mg total) by mouth 3 (three) times daily as needed for dizziness.    Dispense:  30 tablet    Refill:  0   Patient Instructions  As we discussed there can be many causes of lightheadedness or dizziness.  Blood pressure was on the lower side here in the office, I will decrease your hydrochlorothiazide  temporarily, have ordered some labs, and we can recheck in the next week or 2 to decide if other  testing needed.  If any new or worsening symptoms be seen here or emergency room in the interim. If you are noticing a spinning sensation, you can try meclizine  to see if that may be helpful, but does not sound typical of vertigo at this time. Heart rate is little bit slow with some slight abnormality in the EKG but that may be similar to your previous one.  I would like you to see cardiology and we will send in a referral.  Return to the clinic or go to the nearest emergency room if any of your symptoms worsen or new symptoms occur.   For the shortness of breath, I did check a blood test as well but I would like you to have the x-ray at the [facility] location below.  If any concerns on studies above I will let you know, otherwise follow-up in the next week or 2.  [facility] or xray: Walk in 8:30-4:30 during weekdays, no appointment needed [address]  [place], [place] [address]   Dizziness Dizziness is a common problem. It makes you feel unsteady or light-headed. You may feel like you're about to faint. Dizziness can lead to getting hurt if you stumble or fall. It's more common to feel dizzy if you're an older adult. Many things can cause you to feel dizzy. These include: Medicines. Dehydration. This is when there's not enough water in your body. Illness. Follow these instructions at home: Eating and drinking  Drink enough fluid to keep your pee (urine) pale yellow. This helps keep you from getting dehydrated. Try to drink more clear fluids, such as water. Do not drink alcohol . Try to limit how much caffeine you take in. Try to limit how much salt, also called sodium, you take in. Activity Try not to make quick movements. Stand up slowly from sitting in a chair. Steady yourself until you feel okay. In the morning, first sit up on the side of the bed. When you feel okay, hold onto something and slowly stand up. Do this until you know that your balance is okay. If you need to  stand in one place for a long time, move your legs often. Tighten and relax the muscles in your legs while you're standing. Do not drive or use machines if you feel dizzy. Avoid bending down if you feel dizzy. Place items in your home so you can reach them without leaning over. Lifestyle Do not smoke, vape, or use products with nicotine or tobacco in them. If you need help quitting, talk with your health care provider. Try to lower your stress level. You can do this by using methods like yoga or meditation. Talk with your provider if you need help. General instructions Watch your dizziness for any changes. Take your medicines only as told by your provider. Talk with your provider if you think you're dizzy because of a medicine you're taking. Tell a friend or  a family member that you're feeling dizzy. If they spot any changes in your behavior, have them call your provider. Contact a health care provider if: Your dizziness doesn't go away, or you have new symptoms. Your dizziness gets worse. You feel like you may vomit. You have trouble hearing. You have a fever. You have neck pain or a stiff neck. You fall or get hurt. Get help right away if: You vomit each time you eat or drink. You have watery poop and can't eat or drink. You have trouble talking, walking, swallowing, or using your arms, hands, or legs. You feel very weak. You're bleeding. You're not thinking clearly, or you have trouble forming sentences. A friend or family member may spot this. Your vision changes, or you get a very bad headache. These symptoms may be an emergency. Call 911 right away. Do not wait to see if the symptoms will go away. Do not drive yourself to the hospital. This information is not intended to replace advice given to you by your health care provider. Make sure you discuss any questions you have with your health care provider. Document Revised: 06/18/2023 Document Reviewed: 10/30/2022 Elsevier Patient  Education  2024 Elsevier Inc.    Signed,   Reyes Pines, MD [facility], [facility] 04/08/24 8:59 AM

## 2024-04-08 NOTE — Patient Instructions (Addendum)
 As we discussed there can be many causes of lightheadedness or dizziness.  Blood pressure was on the lower side here in the office, I will decrease your hydrochlorothiazide  temporarily, have ordered some labs, and we can recheck in the next week or 2 to decide if other testing needed.  If any new or worsening symptoms be seen here or emergency room in the interim. If you are noticing a spinning sensation, you can try meclizine  to see if that may be helpful, but does not sound typical of vertigo at this time. Heart rate is little bit slow with some slight abnormality in the EKG but that may be similar to your previous one.  I would like you to see cardiology and we will send in a referral.  Return to the clinic or go to the nearest emergency room if any of your symptoms worsen or new symptoms occur.   For the shortness of breath, I did check a blood test as well but I would like you to have the x-ray at the [facility] location below.  If any concerns on studies above I will let you know, otherwise follow-up in the next week or 2.  [facility] or xray: Walk in 8:30-4:30 during weekdays, no appointment needed [address]  [place], [place] [address]   Dizziness Dizziness is a common problem. It makes you feel unsteady or light-headed. You may feel like you're about to faint. Dizziness can lead to getting hurt if you stumble or fall. It's more common to feel dizzy if you're an older adult. Many things can cause you to feel dizzy. These include: Medicines. Dehydration. This is when there's not enough water in your body. Illness. Follow these instructions at home: Eating and drinking  Drink enough fluid to keep your pee (urine) pale yellow. This helps keep you from getting dehydrated. Try to drink more clear fluids, such as water. Do not drink alcohol . Try to limit how much caffeine you take in. Try to limit how much salt, also called sodium, you take in. Activity Try not to make quick  movements. Stand up slowly from sitting in a chair. Steady yourself until you feel okay. In the morning, first sit up on the side of the bed. When you feel okay, hold onto something and slowly stand up. Do this until you know that your balance is okay. If you need to stand in one place for a long time, move your legs often. Tighten and relax the muscles in your legs while you're standing. Do not drive or use machines if you feel dizzy. Avoid bending down if you feel dizzy. Place items in your home so you can reach them without leaning over. Lifestyle Do not smoke, vape, or use products with nicotine or tobacco in them. If you need help quitting, talk with your health care provider. Try to lower your stress level. You can do this by using methods like yoga or meditation. Talk with your provider if you need help. General instructions Watch your dizziness for any changes. Take your medicines only as told by your provider. Talk with your provider if you think you're dizzy because of a medicine you're taking. Tell a friend or a family member that you're feeling dizzy. If they spot any changes in your behavior, have them call your provider. Contact a health care provider if: Your dizziness doesn't go away, or you have new symptoms. Your dizziness gets worse. You feel like you may vomit. You have trouble  hearing. You have a fever. You have neck pain or a stiff neck. You fall or get hurt. Get help right away if: You vomit each time you eat or drink. You have watery poop and can't eat or drink. You have trouble talking, walking, swallowing, or using your arms, hands, or legs. You feel very weak. You're bleeding. You're not thinking clearly, or you have trouble forming sentences. A friend or family member may spot this. Your vision changes, or you get a very bad headache. These symptoms may be an emergency. Call 911 right away. Do not wait to see if the symptoms will go away. Do not drive  yourself to the hospital. This information is not intended to replace advice given to you by your health care provider. Make sure you discuss any questions you have with your health care provider. Document Revised: 06/18/2023 Document Reviewed: 10/30/2022 Elsevier Patient Education  2024 [employer].

## 2024-04-08 NOTE — Telephone Encounter (Signed)
 Copied from CRM [id number]. Topic: General - Call Back - No Documentation >> Apr 08, 2024 10:57 AM Chiquita SQUIBB wrote: Reason for CRM: Chole from [facility] is calling in she believes she spoke to [facility] earlier but was not sure regarding getting the patient in. She said they had a scheduling error and the patient is now scheduled for July 25th at 8:50am but also on the wait list as well. If any questions please contact her back at [phone].

## 2024-04-14 ENCOUNTER — Ambulatory Visit: Admitting: Cardiology

## 2024-04-20 ENCOUNTER — Ambulatory Visit: Admitting: Family Medicine

## 2024-04-20 VITALS — BP 126/70 | HR 60 | Temp 98.1°F | Ht 65.0 in | Wt 204.8 lb

## 2024-04-20 DIAGNOSIS — R519 Headache, unspecified: Secondary | ICD-10-CM

## 2024-04-20 DIAGNOSIS — R42 Dizziness and giddiness: Secondary | ICD-10-CM | POA: Diagnosis not present

## 2024-04-20 DIAGNOSIS — F439 Reaction to severe stress, unspecified: Secondary | ICD-10-CM | POA: Diagnosis not present

## 2024-04-20 DIAGNOSIS — F32A Depression, unspecified: Secondary | ICD-10-CM | POA: Diagnosis not present

## 2024-04-20 NOTE — Progress Notes (Unsigned)
 Cardiology Office Note    Date:  04/22/2024  ID:  Elizabeth Lin, DOB 02-03-1946, MRN [id number] PCP:  Levora Reyes [name], MD  Cardiologist:  Alm Clay, MD  Electrophysiologist:  None   Chief Complaint: Dizziness/lightheadedness   History of Present Illness: .    Elizabeth Lin is a 78 y.o. female with visit-pertinent history of right bundle branch block, mild aortic stenosis, hyperlipidemia, coronary calcium  score of 0, carpal tunnel disease and hypothyroidism.  CT cardiac scoring in 2019 indicated coronary calcium  score of 0, echo at that time indicated LVEF 60 to 65%, normal wall motion, mildly increased wall thickness, G1 DD, mild aortic stenosis.  Patient was last seen in clinic by Dr. Clay on 01/20/2022.  Patient reported some left-sided headache and dizziness that would come and go, denied any passing out.  Patient elected to recheck coronary calcium  score, this indicated a coronary calcium  score of 0.  Echocardiogram on 12/01/2022 indicated LVEF 60 to 65%, no RWMA, mild LVH, G1 DD, RV systolic function and size was normal, no evidence of mitral stenosis or regurgitation, mild aortic valve stenosis, mild dilation of the ascending aorta measuring 39 mm.  Today she presents for regarding dizziness/lightheadedness.  On discussion with patient she has 2 separate sensations occurring, patient reports that with position changes she will become increasingly dizzy, typically resolves within a few seconds and with increasing her activity.  Patient also reports a sensation of lightheadedness or a tingling sensation in the back of her head, patient notes that when she is occupied or focused on doing something she is not aware of the sensation however when sitting and relaxing it persist, notes this has been present for 6 to 8 months.  Patient also endorses feeling tired and a lack of motivation, reports that she is not doing activities as she typically normally would.  However she does  report that she walks her dog multiple times a day and overall tolerates well.  She denies any chest pain on exertion, notes a fleeting pinprick sensation in different spots on her chest that can occur at random.  She denies any significant shortness of breath.  She does endorse increased lower extremity edema, notes that she has always had larger ankles.  She also endorses a fluid bump on her neck that has intermittently been present. She denies any orthopnea or PND or significant palpitations.  Labwork independently reviewed: 04/08/2024: Sodium 137, potassium 3.7, creatinine 0.77, AST 23, ALT 19, proBNP 55, hemoglobin 13.9, hematocrit 42, TSH 1.58 ROS: .   Today she denies chest pain, shortness of breath, palpitations, melena, hematuria, hemoptysis, diaphoresis, weakness, presyncope, syncope, orthopnea, and PND.  All other systems are reviewed and otherwise negative. Studies Reviewed: SABRA   EKG:  EKG is ordered today, personally reviewed, demonstrating  EKG Interpretation Date/Time:  Friday April 22 2024 08:44:58 EDT Ventricular Rate:  51 PR Interval:  224 QRS Duration:  158 QT Interval:  464 QTC Calculation: 427 R Axis:   -47  Text Interpretation: Sinus bradycardia with 1st degree A-V block Right bundle branch block Left anterior fascicular block Bifascicular block Minimal voltage criteria for LVH, may be normal variant ( R in aVL ) Septal infarct (cited on or before 08-Nov-2019) Confirmed by Elizabeth Lin [phone]) on 04/22/2024 7:09:31 PM   CV Studies: Cardiac studies reviewed are outlined and summarized above. Otherwise please see EMR for full report. Cardiac Studies & Procedures   ______________________________________________________________________________________________     ECHOCARDIOGRAM  ECHOCARDIOGRAM COMPLETE 12/01/2022  Narrative  ECHOCARDIOGRAM REPORT    Patient Name:   Elizabeth Lin Date of Exam: 12/01/2022 Medical Rec #:  [id number]         Height:       65.5  in Accession #:    [id number]        Weight:       199.5 lb Date of Birth:  [birth year]        BSA:          1.987 m Patient Age:    76 years          BP:           130/84 mmHg Patient Gender: F                 HR:           60 bpm. Exam Location:  [address]  Procedure: 2D Echo, Color Doppler and Cardiac Doppler  Indications:     Abnormal ECG R94.31  History:         Patient has prior history of Echocardiogram examinations, most recent 12/09/2019.  Sonographer:     Augustin Seals RDCS Referring Phys:  [id number] DAVID W HARDING Diagnosing Phys: Lonni Nanas MD  IMPRESSIONS   1. Left ventricular ejection fraction, by estimation, is 60 to 65%. The left ventricle has normal function. The left ventricle has no regional wall motion abnormalities. There is mild left ventricular hypertrophy. Left ventricular diastolic parameters are consistent with Grade I diastolic dysfunction (impaired relaxation). 2. Right ventricular systolic function is normal. The right ventricular size is normal. Tricuspid regurgitation signal is inadequate for assessing PA pressure. 3. The mitral valve is normal in structure. No evidence of mitral valve regurgitation. No evidence of mitral stenosis. 4. The aortic valve is tricuspid. Aortic valve regurgitation is mild. Mild aortic valve stenosis. Vmax 2.0 m/s, MG , AVA 1.6 cm^2, DI 0.5 5. Aortic dilatation noted. There is mild dilatation of the ascending aorta, measuring 39 mm. 6. The inferior vena cava is normal in size with greater than 50% respiratory variability, suggesting right atrial pressure of 3 mmHg.  FINDINGS Left Ventricle: Left ventricular ejection fraction, by estimation, is 60 to 65%. The left ventricle has normal function. The left ventricle has no regional wall motion abnormalities. The left ventricular internal cavity size was normal in size. There is mild left ventricular hypertrophy. Left ventricular diastolic parameters are consistent  with Grade I diastolic dysfunction (impaired relaxation).  Right Ventricle: The right ventricular size is normal. No increase in right ventricular wall thickness. Right ventricular systolic function is normal. Tricuspid regurgitation signal is inadequate for assessing PA pressure.  Left Atrium: Left atrial size was normal in size.  Right Atrium: Right atrial size was normal in size.  Pericardium: There is no evidence of pericardial effusion.  Mitral Valve: The mitral valve is normal in structure. No evidence of mitral valve regurgitation. No evidence of mitral valve stenosis.  Tricuspid Valve: The tricuspid valve is normal in structure. Tricuspid valve regurgitation is trivial.  Aortic Valve: The aortic valve is tricuspid. Aortic valve regurgitation is mild. Mild aortic stenosis is present. Aortic valve mean gradient measures 8.0 mmHg. Aortic valve peak gradient measures 14.5 mmHg. Aortic valve area, by VTI measures 2.27 cm.  Pulmonic Valve: The pulmonic valve was not well visualized. Pulmonic valve regurgitation is trivial.  Aorta: The aortic root is normal in size and structure and aortic dilatation noted. There is mild dilatation of the ascending aorta, measuring 39 mm.  Venous: The inferior vena cava is normal in size with greater than 50% respiratory variability, suggesting right atrial pressure of 3 mmHg.  IAS/Shunts: The interatrial septum was not well visualized.   LEFT VENTRICLE PLAX 2D LVIDd:         4.90 cm   Diastology LVIDs:         3.00 cm   LV e' medial:    7.10 cm/s LV PW:         0.90 cm   LV E/e' medial:  6.1 LV IVS:        1.10 cm   LV e' lateral:   7.80 cm/s LVOT diam:     2.30 cm   LV E/e' lateral: 5.5 LV SV:         93 LV SV Index:   47 LVOT Area:     4.15 cm   RIGHT VENTRICLE RV Basal diam:  3.50 cm RV Mid diam:    2.70 cm RV S prime:     11.70 cm/s TAPSE (M-mode): 2.3 cm  LEFT ATRIUM             Index        RIGHT ATRIUM           Index LA diam:         4.20 cm 2.11 cm/m   RA Area:     11.90 cm LA Vol (A2C):   35.5 ml 17.86 ml/m  RA Volume:   24.30 ml  12.23 ml/m LA Vol (A4C):   29.6 ml 14.89 ml/m LA Biplane Vol: 32.3 ml 16.25 ml/m AORTIC VALVE AV Area (Vmax):    2.25 cm AV Area (Vmean):   2.12 cm AV Area (VTI):     2.27 cm AV Vmax:           190.50 cm/s AV Vmean:          129.500 cm/s AV VTI:            0.409 m AV Peak Grad:      14.5 mmHg AV Mean Grad:      8.0 mmHg LVOT Vmax:         103.00 cm/s LVOT Vmean:        66.000 cm/s LVOT VTI:          0.223 m LVOT/AV VTI ratio: 0.55  AORTA Ao Root diam: 3.50 cm Ao Asc diam:  3.90 cm  MITRAL VALVE MV Area (PHT): 2.69 cm    SHUNTS MV Decel Time: 282 msec    Systemic VTI:  0.22 m MV E velocity: 43.00 cm/s  Systemic Diam: 2.30 cm MV A velocity: 65.70 cm/s MV E/A ratio:  0.65  Lonni Nanas MD Electronically signed by Lonni Nanas MD Signature Date/Time: 12/01/2022/4:24:08 PM    Final (Updated)      CT SCANS  CT CARDIAC SCORING (SELF PAY ONLY) 01/28/2022  Addendum 01/28/2022  4:36 PM ADDENDUM REPORT: 01/28/2022 16:34  ADDENDUM: Cardiovascular Disease Risk stratification  EXAM: Coronary Calcium  Score  TECHNIQUE: A gated, non-contrast computed tomography scan of the heart was performed using 3mm slice thickness. Axial images were analyzed on a dedicated workstation. Calcium  scoring of the coronary arteries was performed using the Agatston method.  FINDINGS: Coronary arteries: Normal origins.  Coronary Calcium  Score:  Left main: 0  Left anterior descending artery: 0  Left circumflex artery: 0  Right coronary artery: 0  Total: 0  Percentile: 0  Pericardium: Normal.  Ascending Aorta: Normal caliber.  Non-cardiac:  See separate report from [facility].  IMPRESSION: Coronary calcium  score of 0. This was 0 percentile for age-, race-, and sex-matched controls.  RECOMMENDATIONS: Coronary artery calcium  (CAC) score is  a strong predictor of incident coronary heart disease (CHD) and provides predictive information beyond traditional risk factors. CAC scoring is reasonable to use in the decision to withhold, postpone, or initiate statin therapy in intermediate-risk or selected borderline-risk asymptomatic adults (age 58-75 years and LDL-C >=70 to <190 mg/dL) who do not have diabetes or established atherosclerotic cardiovascular disease (ASCVD).* In intermediate-risk (10-year ASCVD risk >=7.5% to <20%) adults or selected borderline-risk (10-year ASCVD risk >=5% to <7.5%) adults in whom a CAC score is measured for the purpose of making a treatment decision the following recommendations have been made:  If CAC=0, it is reasonable to withhold statin therapy and reassess in 5 to 10 years, as long as higher risk conditions are absent (diabetes mellitus, family history of premature CHD in first degree relatives (males <55 years; females <65 years), cigarette smoking, or LDL >=190 mg/dL).  If CAC is 1 to 99, it is reasonable to initiate statin therapy for patients >=32 years of age.  If CAC is >=100 or >=75th percentile, it is reasonable to initiate statin therapy at any age.  Cardiology referral should be considered for patients with CAC scores >=400 or >=75th percentile.  *2018 AHA/ACC/AACVPR/AAPA/ABC/ACPM/ADA/AGS/APhA/ASPC/NLA/PCNA Guideline on the Management of Blood Cholesterol: A Report of the American College of Cardiology/American Heart Association Task Force on Clinical Practice Guidelines. J Am Coll Cardiol. 2019;73(24):3168-3209.  Kardie Tobb, DO [facility]  The noncardiac portion of this study will be interpreted in separate report by the radiologist.   Electronically Signed By: Kardie  Tobb D.O. On: 01/28/2022 16:34  Narrative CLINICAL DATA:  This over-read does not include interpretation of cardiac or coronary anatomy or pathology. The coronary calcium  score interpretation by the  cardiologist is attached.  COMPARISON:  Coronary calcium  score dated November 18, 2017.  FINDINGS: Cardiovascular: There are no significant vascular findings. There is no pericardial effusion.  Mediastinum/Nodes: There are no enlarged lymph nodes.The visualized esophagus demonstrates no significant findings.  Lungs/Pleura: Unchanged minimal scarring in the right middle lobe. No consolidation, pleural effusion, or pneumothorax. No suspicious pulmonary nodule.  Upper abdomen: No acute abnormality.  Musculoskeletal/Chest wall: No chest wall abnormality. No acute or significant osseous findings.  IMPRESSION: 1. No significant non-cardiac findings.  Electronically Signed: By: Elsie ONEIDA Shoulder M.D. On: 01/28/2022 11:27   CT SCANS  CT CARDIAC SCORING (SELF PAY ONLY) 11/18/2017  Addendum 11/18/2017  3:43 PM ADDENDUM REPORT: 11/18/2017 15:41  CLINICAL DATA:  Risk stratification  EXAM: Coronary Calcium  Score  TECHNIQUE: The patient was scanned on a [employer] scanner. Axial non-contrast 3 mm slices were carried out through the heart. The data set was analyzed on a dedicated work station and scored using the Agatson method.  FINDINGS: Non-cardiac: See separate report from [facility].  Ascending Aorta: Normal size, trivial calcifications in the aortic root.  Pericardium: Normal.  Coronary arteries: Normal origin.  IMPRESSION: Coronary calcium  score of 0. This was 0 percentile for age and sex matched control.   Electronically Signed By: Leim Moose On: 11/18/2017 15:41  Narrative EXAM: OVER-READ INTERPRETATION  CT CHEST  The following report is an over-read performed by radiologist Dr. Toribio Aye of [facility] on 11/18/2017. This over-read does not include interpretation of cardiac or coronary anatomy or pathology. The coronary calcium  score interpretation by the cardiologist is attached.  COMPARISON:  None.  FINDINGS: Within the visualized portions of the thorax there are no suspicious appearing pulmonary nodules or masses, there is no acute consolidative airspace disease, no pleural effusions, no pneumothorax and no lymphadenopathy. Visualized portions of the upper abdomen are unremarkable. There are no aggressive appearing lytic or blastic lesions noted in the visualized portions of the skeleton.  IMPRESSION: 1. No significant incidental noncardiac findings are noted.  Electronically Signed: By: Toribio Aye M.D. On: 11/18/2017 14:45     ______________________________________________________________________________________________       Current Reported Medications:.    Current Meds  Medication Sig   acetaminophen  (TYLENOL ) 500 MG tablet Take by mouth.   ascorbic acid (VITAMIN C) 1000 MG tablet Take by mouth.   atorvastatin  (LIPITOR) 10 MG tablet 1 Tablet by mouth FOUR times a week   Calcium -Vitamin D -Vitamin K (CALCIUM  + D + K PO) Take 1 tablet by mouth 2 (two) times daily. 1300 mg+D1000 IU   ciclopirox  (PENLAC ) 8 % solution Apply topically at bedtime. Apply over nail. Apply daily over previous coat. Remove weekly with polish remover.   DULoxetine  (CYMBALTA ) 20 MG capsule TAKE 2 CAPSULES BY MOUTH EVERY DAY   DULoxetine  (CYMBALTA ) 60 MG capsule Take 1 capsule (60 mg total) by mouth daily.   fish oil-omega-3 fatty acids 1000 MG capsule Take 1 g by mouth 2 (two) times daily.    hydrochlorothiazide  (HYDRODIURIL ) 12.5 MG tablet Take 1 tablet (12.5 mg total) by mouth daily.   ibuprofen (ADVIL) 200 MG tablet Take by mouth.   ketoconazole  (NIZORAL ) 2 % shampoo APPLY 1 APPLICATION TOPICALLY ONCE A WEEK.   levothyroxine  (SYNTHROID ) 150 MCG tablet TAKE 1 TABLET BY MOUTH EVERY DAY   meclizine  (ANTIVERT ) 25 MG tablet Take 1 tablet (25 mg total) by mouth 3 (three) times daily as needed for dizziness.   naproxen  sodium (ALEVE ) 220 MG tablet Take 220-440 mg by mouth 2 (two) times  daily as needed (pain/soreness).   ofloxacin (OCUFLOX) 0.3 % ophthalmic solution Apply to eye.   Polyethyl Glycol-Propyl Glycol (LUBRICANT EYE DROPS) 0.4-0.3 % SOLN Place 1 drop into both eyes 3 (three) times daily as needed (dry/irritated eyes.). Rohto Ice Multi-Symptom Relief   Vitamin E (VITAMIN E/D-ALPHA NATURAL) 268 MG (400 UNIT) CAPS Take by mouth.   Physical Exam:    VS:  BP (!) 152/75   Pulse (!) 51   Ht 5' 5.5 (1.664 m)   Wt 204 lb 9.6 oz (92.8 kg)   SpO2 98%   BMI 33.53 kg/m    Wt Readings from Last 3 Encounters:  04/22/24 204 lb 9.6 oz (92.8 kg)  04/20/24 204 lb 12.8 oz (92.9 kg)  04/08/24 202 lb 2 oz (91.7 kg)    GEN: Well nourished, well developed in no acute distress NECK: No JVD; No carotid bruits CARDIAC: RRR, 2/6 systolic murmur, no rubs or gallops RESPIRATORY:  Clear to auscultation without rales, wheezing or rhonchi  ABDOMEN: Soft, non-tender, non-distended EXTREMITIES:  No edema; No acute deformity     Asessement and Plan:.    Dizziness/lightheadedness: Patient reports intermittent dizziness and lightheadedness.  She has she has 2 separate sensations occurring, notes that with position changes she will become increasingly dizzy, resolves within a few seconds and with increasing her activity, patient noted to have drop in blood pressure as noted below.  Patient also reports a different sensation described as lightheadedness or a tingling sensation in the back of her head that is persistent and even at rest, patient notes that when  she is distracted she is not aware of sensation however when sitting and relaxing it is ever present.  Patient also endorses increased fatigue and decreased motivation to complete activities.  Patient does not feel that this is related to vertigo.  Will check echocardiogram and 1 week cardiac monitor given current bradycardia.  Will also check carotid Dopplers.  HTN: Blood pressure today 135/64.  Orthostatic vital signs were obtained, shown  below.  Does indicate a decrease in blood pressure.  Patient notes with position changes she does have some mild dizziness, that typically resolves within a few seconds.  Patient notes that she does have increased lower extremity edema as she does not take her hydrochlorothiazide  regularly.  Encourage patient to increase her fluid intake, wear compression stockings and complete slow position changes. Orthostatic BP: Supine: 152/75, sitting: 135/64, standing 127/71, standing 119/70  Lower extremity edema: Patient notes that she has history of increased lower extremity edema, has noted recent worsening with decreased dose of hydrochlorothiazide  however given orthostatic vitals as noted above would not recommend increasing further at this time.  Recommended she wear compression stockings and elevate her lower extremities.  Recent BNP was normal.  Check echocardiogram.  RBBB: Chronic, stable.  Patient does note intermittent lightheadedness/dizziness as noted above, she will wear a 1 week with an online ZIO as noted above.  Aortic stenosis: Noted as mild on echo in 11/2022.  Checking echocardiogram as noted above, will allow for further monitoring.    Disposition: F/u with Mayelin Panos, NP in two months.   Signed, Versie Fleener D Christion Leonhard, NP

## 2024-04-20 NOTE — Progress Notes (Unsigned)
 Subjective:  Patient ID: Elizabeth Lin, female    DOB: March 21, 1946  Age: 78 y.o. MRN: [id number]  CC:  Chief Complaint  Patient presents with   Dizziness    Pt notes sxs are a lot better but still some mild sxs in the back of her head     HPI Elizabeth Lin presents for follow up.   Here with dtr Elizabeth Lin.   Lightheadedness/dizziness Follow-up from July 11 visit.  Symptoms were noted for a few months at that time, with sitting to standing or head movement, however not exclusive to just having movement.  Some nausea without vomiting.  Some dyspnea at the end of walking, some noise in her breathing with leaning forward but not with walking.  Noticed a pressure sensation in the back of her head at times that was associated at times of dizziness.  History of Mnire's treated with hydrochlorothiazide  25 mg daily.  Denied tinnitus or ear symptoms.  Heart rate in the 50s on orthostatics, blood pressure on the lower side but did not drop significantly on orthostatic testing.  Persistent right bundle branch block and bradycardia on EKG.  Nonfocal neurologic exam in office.  Her CBC TSH CMP were overall reassuring and chest x-ray was clear.  Possible multifactorial cause of dizziness with history of Mnire's but not strictly vertiginous symptoms.  Referred to cardiology.  Hydrochlorothiazide  dose was decreased to 12.5 mg daily.  Maintenance of hydration discussed. Appointment with cardiology in 2 days.  She is feeling better from last visit. Much better. Not having as much dizziness as last time. Still has a feeling in head, back of head into top of head - not painful, but constant feeling inside head. Had one fall walking up hill - walking dog - pulled on her and fell. No injury, no head injury. No syncope/near-syncope.  No CP/palpitaitons.  Some stressors at home. Handicapped dtr requires significant assistance.  Still occasional nose leaning forward.  Here with dtr Elizabeth Lin - has not heard noise.   No new menieres sx's with lower dose hydrochlorothiazide .  Elizabeth Lin has noticed more depression symptoms/sad.  No change in symptoms at 60mg  dose cymbalta  in past - on 40mg  now. Would like to meet with therapist.  No SI/HI.    BP Readings from Last 3 Encounters:  04/20/24 126/70  04/08/24 98/62  08/20/23 (!) 144/70   CT head June 2023 - Chronic changes with no acute intracranial process identified.  Fatigue after being active for 2 hours. 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   Sensorineural hearing loss (SNHL), bilateral 04/27/2019   Meniere disease, left 12/12/2017   Mild aortic stenosis 11/10/2017   Bilateral edema of lower extremity 11/10/2017   Family history of premature CAD 11/10/2017   Arthralgia of multiple joints 10/23/2017   Class 1 obesity due to excess calories without serious comorbidity with body mass index (BMI) of 32.0 to 32.9 in adult 10/23/2017   Essential hypertension 10/23/2017   Right bundle branch block (RBBB) 10/23/2017   Pure hypercholesterolemia 10/23/2017   Vitamin D  deficiency  09/06/2014   Right knee pain 02/24/2013   Hypothyroid 11/02/2011   Depression 11/02/2011   History of positive PPD 11/02/2011   Past Medical History:  Diagnosis Date   Allergy    Anxiety    Arthritis    Carpal tunnel syndrome    right hand, getting injections   Cataract    Depression    Dysrhythmia    BBB   Hammer toe    Hypothyroidism due to Hashimoto's thyroiditis    Meniere disease, right    Past Surgical History:  Procedure  Laterality Date   APPENDECTOMY     COLONOSCOPY     CYSTECTOMY     eye   LAPAROSCOPIC ASSISTED SPIGELIAN HERNIA REPAIR N/A 11/08/2019   Procedure: LAPAROSCOPIC ASSISTED SPIGELIAN HERNIA REPAIR WITH MESH;  Surgeon: Belinda Cough, MD;  Location: [facility];  Service: General;  Laterality: N/A;   OOPHORECTOMY Right    TONSILLECTOMY     TRANSTHORACIC ECHOCARDIOGRAM  12/09/2019   EF 60 to 65%.  No R WMA.  GR 1 DD.  Normal RV function.  Aortic calcification with sclerosis but no suggestion of stenosis.  (Mean gradient 8 mmHg)   Allergies  Allergen Reactions   Penicillins Rash    Did it involve swelling of the face/tongue/throat, SOB, or low BP? No Did it involve sudden or severe rash/hives, skin peeling, or any reaction on the inside of your mouth or nose? Yes Did you need to seek medical attention at a hospital or doctor's office? No When did it last happen?  ~6-10 years ago     If all above answers are NO, may proceed with cephalosporin use.    Amoxicillin-Pot Clavulanate     Red rash Did it involve swelling of the face/tongue/throat, SOB, or low BP? No Did it involve sudden or severe rash/hives, skin peeling, or any reaction on the inside of your mouth or nose? Yes Did you need to seek medical attention at a hospital or doctor's office? No When did it last happen? ~6-10 years ago    If all above answers are "NO", may proceed with cephalosporin use.    Sulfa Antibiotics    Prior to Admission medications   Medication Sig Start Date End Date Taking? Authorizing Provider  acetaminophen  (TYLENOL ) 500 MG tablet Take by mouth.    [provider]  ascorbic acid (VITAMIN C) 1000 MG tablet Take by mouth.    [provider]  atorvastatin  (LIPITOR) 10 MG tablet 1 Tablet by mouth FOUR times a week 03/28/24   Levora Elizabeth SAUNDERS, MD  Calcium -Vitamin D -Vitamin K (CALCIUM  + D + K PO) Take 1 tablet by mouth 2 (two) times daily. 1300 mg+D1000 IU    [provider]  ciclopirox   (PENLAC ) 8 % solution Apply topically at bedtime. Apply over nail. Apply daily over previous coat. Remove weekly with polish remover. 10/20/23   McCaughan, Dia D, DPM  DULoxetine  (CYMBALTA ) 20 MG capsule TAKE 2 CAPSULES BY MOUTH EVERY DAY 08/21/23   Levora Elizabeth SAUNDERS, MD  DULoxetine  (CYMBALTA ) 60 MG capsule Take 1 capsule (60 mg total) by mouth daily. 07/24/23   Levora Elizabeth SAUNDERS, MD  fish oil-omega-3 fatty acids 1000 MG capsule Take 1 g by mouth 2 (two) times daily.     [provider]  hydrochlorothiazide  (HYDRODIURIL ) 12.5 MG tablet Take 1 tablet (12.5 mg total) by mouth daily. 04/08/24   Levora Elizabeth SAUNDERS, MD  ibuprofen (ADVIL) 200 MG tablet Take by mouth.    [provider]  ketoconazole  (NIZORAL ) 2 % shampoo APPLY 1 APPLICATION TOPICALLY ONCE A WEEK. 10/27/22   Levora Elizabeth SAUNDERS, MD  levothyroxine  (SYNTHROID ) 150 MCG tablet TAKE 1 TABLET BY MOUTH EVERY DAY 07/23/23   Levora Elizabeth SAUNDERS, MD  meclizine  (ANTIVERT ) 25 MG tablet Take 1 tablet (25 mg total) by mouth 3 (three) times daily as needed for dizziness. 04/08/24   Levora Elizabeth SAUNDERS, MD  naproxen  sodium (ALEVE ) 220 MG tablet Take 220-440 mg by mouth 2 (two) times daily as needed (pain/soreness).    [provider]  ofloxacin (OCUFLOX) 0.3 % ophthalmic solution Apply to eye.    [provider]  Polyethyl Glycol-Propyl Glycol (LUBRICANT EYE DROPS) 0.4-0.3 % SOLN Place 1 drop into both eyes 3 (three) times daily as needed (dry/irritated eyes.). Rohto Ice Multi-Symptom Relief    [provider]  Vitamin E (VITAMIN E/D-ALPHA NATURAL) 268 MG (400 UNIT) CAPS Take by mouth.    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  3 children, 3 grandchildren.   Patient's  Education: [employer].  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   Patient is right handed   Caffeine consumption 2-3 daily    Walks daily for roughly 30 minutes at a time.  She has not been doing it in the wintertime due to cold weather.      Social Drivers of Corporate investment banker Strain: Low Risk  (03/26/2023)   Overall Financial Resource Strain (CARDIA)    Difficulty of Paying Living Expenses: Not hard at all  Food Insecurity: Low Risk  (06/16/2023)   Received from [facility]   Hunger Vital Sign    Within the past 12 months, you worried that your food would run out before you got money to buy more: Never true    Within the past 12 months, the food you bought just didn't last and you didn't have money to get more. : Never true  Transportation Needs: No Transportation Needs (06/16/2023)   Received from [facility]    In the past 12 months, has lack of reliable transportation kept you from medical appointments, meetings, work or from getting things needed for daily living? : No  Physical Activity: Insufficiently Active (03/26/2023)   Exercise Vital Sign    Days of Exercise per Week: 2 days    Minutes of Exercise per Session: 30 min  Stress: No Stress Concern Present (03/26/2023)   [employer] of Occupational Health - Occupational Stress Questionnaire    Feeling of Stress :  Not at all  Social Connections: Socially Isolated (03/26/2023)   Social Connection and Isolation Panel    Frequency of Communication with Friends and Family: Three times a week    Frequency of Social Gatherings with Friends and Family: Three times a week    Attends Religious Services: Never    Active Member of Clubs or Organizations: No    Attends Banker Meetings: Never    Marital Status: Divorced  Catering manager Violence: Not At Risk (03/26/2023)   Humiliation, Afraid, Rape, and Kick questionnaire    Fear of Current or Ex-Partner: No    Emotionally Abused: No    Physically Abused: No    Sexually Abused: No    Review of Systems   Objective:   Vitals:   04/20/24 1115  BP: 126/70  Pulse: 60  Temp: 98.1 F (36.7 C)  TempSrc: Temporal  SpO2: 95%  Weight: 204 lb 12.8 oz (92.9 kg)  Height: 5' 5 (1.651 m)     Physical Exam Vitals reviewed.  Constitutional:      Appearance: Normal appearance. She is well-developed.  HENT:     Head: Normocephalic and atraumatic.  Eyes:     Conjunctiva/sclera: Conjunctivae normal.     Pupils: Pupils are equal, round, and reactive to light.  Neck:     Vascular: No carotid bruit.  Cardiovascular:     Rate and Rhythm: Normal rate and regular rhythm.     Heart sounds: Normal heart sounds.  Pulmonary:     Effort: Pulmonary effort is normal.     Breath sounds: Normal breath sounds.  Abdominal:     Palpations: Abdomen is soft. There is no pulsatile mass.     Tenderness: There is no abdominal tenderness.  Musculoskeletal:     Right lower leg: No edema.     Left lower leg: No edema.  Skin:    General: Skin is warm and dry.  Neurological:     General: No focal deficit present.     Mental Status: She is alert and oriented to person, place, and time. Mental status is at baseline.     Motor: No weakness.  Psychiatric:        Mood and Affect: Mood normal.        Behavior: Behavior normal.     Comments: Flat affect, tearful at  times during visit.  Good eye contact, appropriate sponsors, does not appear to be responding to internal stimuli.       Assessment & Plan:  Elizabeth Lin is a 78 y.o. female . Lightheaded - Plan: MR BRAIN W WO CONTRAST  Dizziness - Plan: MR BRAIN W WO CONTRAST  Depression, unspecified depression type  Situational stress  Pressure in head - Plan: MR BRAIN W WO CONTRAST Has noticed improvement in dizziness.  Lower dose of hydrochlorothiazide  seems to have been effective in part but still with head pressure/sensation, and still some intermittent dizziness.  Also notes persistent fatigue with minimal activity.  - Check MRI brain to rule out posterior circulation CVA, or mass with persistent dizziness and head pressure/headache sensation.  - Keep follow-up with cardiology and advised to let them know about the fatigue with activity as above.  ER/RTC precautions if acute change in symptoms.  - Recommend meeting with therapist to discuss stressors, and depression management.  Continue Cymbalta  same dose for now, will recheck in 1 month with RTC precautions if acute worsening or new symptoms.   No orders of the defined types were  placed in this encounter.  Patient Instructions  Glad to hear the dizziness has improved, I think the lower dose of hydrochlorothiazide  has been helpful.  However since you are still having some symptoms and with the fatigue after a few hours, keep follow-up with cardiology and let them know of his symptoms at your appointment in 2 days.  With the symptoms in the head and the persistent dizziness I am going to check an MRI of the brain as well.  You should be getting a call for that appointment soon.  I am sorry to hear about the stressors in life.  I do think meeting with a therapist may be helpful and I have provided a few numbers below.  Continue same dose Cymbalta  for now and see other information below on stress and depression management.  Will follow-up in 1  month but see me sooner if any new or worsening symptoms.  Hang in there.   Here are a few options for counseling:   [facility]:  [phone]  [facility]:  [phone]  [facility] [phone]  Managing Stress, Adult Feeling a certain amount of stress is normal. Stress helps our body and mind get ready to deal with the demands of life. Stress hormones can motivate you to do well at work and meet your responsibilities. But severe or long-term (chronic) stress can affect your mental and physical health. Chronic stress puts you at higher risk for: Anxiety and depression. Other health problems such as digestive problems, muscle aches, heart disease, high blood pressure, and stroke. What are the causes? Common causes of stress include: Demands from work, such as deadlines, feeling overworked, or having long hours. Pressures at home, such as money issues, disagreements with a spouse, or parenting issues. Pressures from major life changes, such as divorce, moving, loss of a loved one, or chronic illness. You may be at higher risk for stress-related problems if you: Do not get enough sleep. Are in poor health. Do not have emotional support. Have a mental health disorder such as anxiety or depression. How to recognize stress Stress can make you: Have trouble sleeping. Feel sad, anxious, irritable, or overwhelmed. Lose your appetite. Overeat or want to eat unhealthy foods. Want to use drugs or alcohol . Stress can also cause physical symptoms, such as: Sore, tense muscles, especially in the shoulders and neck. Headaches. Trouble breathing. A faster heart rate. Stomach pain, nausea, or vomiting. Diarrhea or constipation. Trouble concentrating. Follow these instructions at home: Eating and drinking Eat a healthy diet. This includes: Eating foods that are high in fiber, such as beans, whole grains, and fresh fruits and  vegetables. Limiting foods that are high in fat and processed sugars, such as fried or sweet foods. Do not skip meals or overeat. Drink enough fluid to keep your urine pale yellow. Alcohol  use Do not drink alcohol  if: Your health care provider tells you not to drink. You are pregnant, may be pregnant, or are planning to become pregnant. Drinking alcohol  is a way some people try to ease their stress. This can be dangerous, so if you drink alcohol : Limit how much you have to: 0-1 drink a day for women. 0-2 drinks a day for men. Know how much alcohol  is in your drink. In the [place] one drink equals one 12 oz bottle of beer (355 mL), one 5 oz glass of wine (148 mL), or one 1 oz glass of hard liquor (44 mL). Activity  Include 30 minutes of exercise  in your daily schedule. Exercise is a good stress reducer. Include time in your day for an activity that you find relaxing. Try taking a walk, going on a bike ride, reading a book, or listening to music. Schedule your time in a way that lowers stress, and keep a regular schedule. Focus on doing what is most important to get done. Lifestyle Identify the source of your stress and your reaction to it. See a therapist who can help you change unhelpful reactions. When there are stressful events: Talk about them with family, friends, or coworkers. Try to think realistically about stressful events and not ignore them or overreact. Try to find the positives in a stressful situation and not focus on the negatives. Cut back on responsibilities at work and home, if possible. Ask for help from friends or family members if you need it. Find ways to manage stress, such as: Mindfulness, meditation, or deep breathing. Yoga or tai chi. Progressive muscle relaxation. Spending time in nature. Doing art, playing music, or reading. Making time for fun activities. Spending time with family and friends. Get support from family, friends, or spiritual  resources. General instructions Get enough sleep. Try to go to sleep and get up at about the same time every day. Take over-the-counter and prescription medicines only as told by your health care provider. Do not use any products that contain nicotine or tobacco. These products include cigarettes, chewing tobacco, and vaping devices, such as e-cigarettes. If you need help quitting, ask your health care provider. Do not use drugs or smoke to deal with stress. Keep all follow-up visits. This is important. Where to find support Talk with your health care provider about stress management or finding a support group. Find a therapist to work with you on your stress management techniques. Where to find more information [employer] on Mental Illness: www.nami.org American Psychological Association: DiceTournament.ca Contact a health care provider if: Your stress symptoms get worse. You are unable to manage your stress at home. You are struggling to stop using drugs or alcohol . Get help right away if: You may be a danger to yourself or others. You have any thoughts of death or suicide. Get help right awayif you feel like you may hurt yourself or others, or have thoughts about taking your own life. Go to your nearest emergency room or: Call 911. Call the National Suicide Prevention Lifeline at 517-803-9401 or 988 in the [place] This is open 24 hours a day. If you're a Veteran: Call 988 and press 1. This is open 24 hours a day. Text the [employer] at 380-272-8630. Summary Feeling a certain amount of stress is normal, but severe or long-term (chronic) stress can affect your mental and physical health. Chronic stress can put you at higher risk for anxiety, depression, and other health problems such as digestive problems, muscle aches, heart disease, high blood pressure, and stroke. You may be at higher risk for stress-related problems if you do not get enough sleep, are in poor health, lack  emotional support, or have a mental health disorder such as anxiety or depression. Identify the source of your stress and your reaction to it. Try talking about stressful events with family, friends, or coworkers, finding a coping method, or getting support from spiritual resources. If you need more help, talk with your health care provider about finding a support group or a mental health therapist. This information is not intended to replace advice given to you by your health care provider.  Make sure you discuss any questions you have with your health care provider. Document Revised: 04/30/2023 Document Reviewed: 04/09/2021 Elsevier Patient Education  2024 Elsevier Inc.  Managing Depression, Adult Depression is a mental health condition that affects your thoughts, feelings, and actions. Being diagnosed with depression can bring you relief if you did not know why you have felt or behaved a certain way. It could also leave you feeling overwhelmed. Finding ways to manage your symptoms can help you feel more positive about your future. How to manage lifestyle changes Being depressed is difficult. Depression can increase the level of everyday stress. Stress can make depression symptoms worse. You may believe your symptoms cannot be managed or will never improve. However, there are many things you can try to help manage your symptoms. There is hope. Managing stress  Stress is your body's reaction to life changes and events, both good and bad. Stress can add to your feelings of depression. Learning to manage your stress can help lessen your feelings of depression. Try some of the following approaches to reducing your stress (stress reduction techniques): Listen to music that you enjoy and that inspires you. Try using a meditation app or take a meditation class. Develop a practice that helps you connect with your spiritual self. Walk in nature, pray, or go to a place of worship. Practice deep breathing.  To do this, inhale slowly through your nose. Pause at the top of your inhale for a few seconds and then exhale slowly, letting yourself relax. Repeat this three or four times. Practice yoga to help relax and work your muscles. Choose a stress reduction technique that works for you. These techniques take time and practice to develop. Set aside 5-15 minutes a day to do them. Therapists can offer training in these techniques. Do these things to help manage stress: Keep a journal. Know your limits. Set healthy boundaries for yourself and others, such as saying no when you think something is too much. Pay attention to how you react to certain situations. You may not be able to control everything, but you can change your reaction. Add humor to your life by watching funny movies or shows. Make time for activities that you enjoy and that relax you. Spend less time using electronics, especially at night before bed. The light from screens can make your brain think it is time to get up rather than go to bed.  Medicines Medicines, such as antidepressants, are often a part of treatment for depression. Talk with your pharmacist or health care provider about all the medicines, supplements, and herbal products that you take, their possible side effects, and what medicines and other products are safe to take together. Make sure to report any side effects you may have to your health care provider. Relationships Your health care provider may suggest family therapy, couples therapy, or individual therapy as part of your treatment. How to recognize changes Everyone responds differently to treatment for depression. As you recover from depression, you may start to: Have more interest in doing activities. Feel more hopeful. Have more energy. Eat a more regular amount of food. Have better mental focus. It is important to recognize if your depression is not getting better or is getting worse. The symptoms you had in  the beginning may return, such as: Feeling tired. Eating too much or too little. Sleeping too much or too little. Feeling restless, agitated, or hopeless. Trouble focusing or making decisions. Having unexplained aches and pains. Feeling irritable, angry, or  aggressive. If you or your family members notice these symptoms coming back, let your health care provider know right away. Follow these instructions at home: Activity Try to get some form of exercise each day, such as walking. Try yoga, mindfulness, or other stress reduction techniques. Participate in group activities if you are able. Lifestyle Get enough sleep. Cut down on or stop using caffeine, tobacco, alcohol , and any other harmful substances. Eat a healthy diet that includes plenty of vegetables, fruits, whole grains, low-fat dairy products, and lean protein. Limit foods that are high in solid fats, added sugar, or salt (sodium). General instructions Take over-the-counter and prescription medicines only as told by your health care provider. Keep all follow-up visits. It is important for your health care provider to check on your mood, behavior, and medicines. Your health care provider may need to make changes to your treatment. Where to find support Talking to others  Friends and family members can be sources of support and guidance. Talk to trusted friends or family members about your condition. Explain your symptoms and let them know that you are working with a health care provider to treat your depression. Tell friends and family how they can help. Finances Find mental health providers that fit with your financial situation. Talk with your health care provider if you are worried about access to food, housing, or medicine. Call your insurance company to learn about your co-pays and prescription plan. Where to find more information You can find support in your area from: Anxiety and Depression Association of America (ADAA):  adaa.org Mental Health America: mentalhealthamerica.net The First American on Mental Illness: nami.org Contact a health care provider if: You stop taking your antidepressant medicines, and you have any of these symptoms: Nausea. Headache. Light-headedness. Chills and body aches. Not being able to sleep (insomnia). You or your friends and family think your depression is getting worse. Get help right away if: You have thoughts of hurting yourself or others. Get help right away if you feel like you may hurt yourself or others, or have thoughts about taking your own life. Go to your nearest emergency room or: Call 911. Call the National Suicide Prevention Lifeline at (614)484-8353 or 988. This is open 24 hours a day. Text the Crisis Text Line at 773-164-3352. This information is not intended to replace advice given to you by your health care provider. Make sure you discuss any questions you have with your health care provider. Document Revised: 01/21/2022 Document Reviewed: 01/21/2022 Elsevier Patient Education  2024 Elsevier Inc.    Signed,   Elizabeth Pines, MD [facility], [facility] 04/20/24 12:11 PM

## 2024-04-20 NOTE — Patient Instructions (Addendum)
 Glad to hear the dizziness has improved, I think the lower dose of hydrochlorothiazide  has been helpful.  However since you are still having some symptoms and with the fatigue after a few hours, keep follow-up with cardiology and let them know of his symptoms at your appointment in 2 days.  With the symptoms in the head and the persistent dizziness I am going to check an MRI of the brain as well.  You should be getting a call for that appointment soon.  I am sorry to hear about the stressors in life.  I do think meeting with a therapist may be helpful and I have provided a few numbers below.  Continue same dose Cymbalta  for now and see other information below on stress and depression management.  Will follow-up in 1 month but see me sooner if any new or worsening symptoms.  Hang in there.   Here are a few options for counseling:  [facility]:  [phone]  [facility]:  [phone]  [facility] [phone]  Managing Stress, Adult Feeling a certain amount of stress is normal. Stress helps our body and mind get ready to deal with the demands of life. Stress hormones can motivate you to do well at work and meet your responsibilities. But severe or long-term (chronic) stress can affect your mental and physical health. Chronic stress puts you at higher risk for: Anxiety and depression. Other health problems such as digestive problems, muscle aches, heart disease, high blood pressure, and stroke. What are the causes? Common causes of stress include: Demands from work, such as deadlines, feeling overworked, or having long hours. Pressures at home, such as money issues, disagreements with a spouse, or parenting issues. Pressures from major life changes, such as divorce, moving, loss of a loved one, or chronic illness. You may be at higher risk for stress-related problems if you: Do not get enough sleep. Are in poor health. Do not have  emotional support. Have a mental health disorder such as anxiety or depression. How to recognize stress Stress can make you: Have trouble sleeping. Feel sad, anxious, irritable, or overwhelmed. Lose your appetite. Overeat or want to eat unhealthy foods. Want to use drugs or alcohol . Stress can also cause physical symptoms, such as: Sore, tense muscles, especially in the shoulders and neck. Headaches. Trouble breathing. A faster heart rate. Stomach pain, nausea, or vomiting. Diarrhea or constipation. Trouble concentrating. Follow these instructions at home: Eating and drinking Eat a healthy diet. This includes: Eating foods that are high in fiber, such as beans, whole grains, and fresh fruits and vegetables. Limiting foods that are high in fat and processed sugars, such as fried or sweet foods. Do not skip meals or overeat. Drink enough fluid to keep your urine pale yellow. Alcohol  use Do not drink alcohol  if: Your health care provider tells you not to drink. You are pregnant, may be pregnant, or are planning to become pregnant. Drinking alcohol  is a way some people try to ease their stress. This can be dangerous, so if you drink alcohol : Limit how much you have to: 0-1 drink a day for women. 0-2 drinks a day for men. Know how much alcohol  is in your drink. In the [place] one drink equals one 12 oz bottle of beer (355 mL), one 5 oz glass of wine (148 mL), or one 1 oz glass of hard liquor (44 mL). Activity  Include 30 minutes of exercise in your daily schedule. Exercise is a good stress  reducer. Include time in your day for an activity that you find relaxing. Try taking a walk, going on a bike ride, reading a book, or listening to music. Schedule your time in a way that lowers stress, and keep a regular schedule. Focus on doing what is most important to get done. Lifestyle Identify the source of your stress and your reaction to it. See a therapist who can help you change unhelpful  reactions. When there are stressful events: Talk about them with family, friends, or coworkers. Try to think realistically about stressful events and not ignore them or overreact. Try to find the positives in a stressful situation and not focus on the negatives. Cut back on responsibilities at work and home, if possible. Ask for help from friends or family members if you need it. Find ways to manage stress, such as: Mindfulness, meditation, or deep breathing. Yoga or tai chi. Progressive muscle relaxation. Spending time in nature. Doing art, playing music, or reading. Making time for fun activities. Spending time with family and friends. Get support from family, friends, or spiritual resources. General instructions Get enough sleep. Try to go to sleep and get up at about the same time every day. Take over-the-counter and prescription medicines only as told by your health care provider. Do not use any products that contain nicotine or tobacco. These products include cigarettes, chewing tobacco, and vaping devices, such as e-cigarettes. If you need help quitting, ask your health care provider. Do not use drugs or smoke to deal with stress. Keep all follow-up visits. This is important. Where to find support Talk with your health care provider about stress management or finding a support group. Find a therapist to work with you on your stress management techniques. Where to find more information The First American on Mental Illness: www.nami.org American Psychological Association: D[employer].ca Contact a health care provider if: Your stress symptoms get worse. You are unable to manage your stress at home. You are struggling to stop using drugs or alcohol . Get help right away if: You may be a danger to yourself or others. You have any thoughts of death or suicide. Get help right awayif you feel like you may hurt yourself or others, or have thoughts about taking your own life. Go to your  nearest emergency room or: Call 911. Call the National Suicide Prevention Lifeline at 403 072 4189 or 988 in the [place] This is open 24 hours a day. If you're a Veteran: Call 988 and press 1. This is open 24 hours a day. Text the [employer] at 228-249-7388. Summary Feeling a certain amount of stress is normal, but severe or long-term (chronic) stress can affect your mental and physical health. Chronic stress can put you at higher risk for anxiety, depression, and other health problems such as digestive problems, muscle aches, heart disease, high blood pressure, and stroke. You may be at higher risk for stress-related problems if you do not get enough sleep, are in poor health, lack emotional support, or have a mental health disorder such as anxiety or depression. Identify the source of your stress and your reaction to it. Try talking about stressful events with family, friends, or coworkers, finding a coping method, or getting support from spiritual resources. If you need more help, talk with your health care provider about finding a support group or a mental health therapist. This information is not intended to replace advice given to you by your health care provider. Make sure you discuss any questions you have with  your health care provider. Document Revised: 04/30/2023 Document Reviewed: 04/09/2021 Elsevier Patient Education  2024 Elsevier Inc.  Managing Depression, Adult Depression is a mental health condition that affects your thoughts, feelings, and actions. Being diagnosed with depression can bring you relief if you did not know why you have felt or behaved a certain way. It could also leave you feeling overwhelmed. Finding ways to manage your symptoms can help you feel more positive about your future. How to manage lifestyle changes Being depressed is difficult. Depression can increase the level of everyday stress. Stress can make depression symptoms worse. You may believe your  symptoms cannot be managed or will never improve. However, there are many things you can try to help manage your symptoms. There is hope. Managing stress  Stress is your body's reaction to life changes and events, both good and bad. Stress can add to your feelings of depression. Learning to manage your stress can help lessen your feelings of depression. Try some of the following approaches to reducing your stress (stress reduction techniques): Listen to music that you enjoy and that inspires you. Try using a meditation app or take a meditation class. Develop a practice that helps you connect with your spiritual self. Walk in nature, pray, or go to a place of worship. Practice deep breathing. To do this, inhale slowly through your nose. Pause at the top of your inhale for a few seconds and then exhale slowly, letting yourself relax. Repeat this three or four times. Practice yoga to help relax and work your muscles. Choose a stress reduction technique that works for you. These techniques take time and practice to develop. Set aside 5-15 minutes a day to do them. Therapists can offer training in these techniques. Do these things to help manage stress: Keep a journal. Know your limits. Set healthy boundaries for yourself and others, such as saying no when you think something is too much. Pay attention to how you react to certain situations. You may not be able to control everything, but you can change your reaction. Add humor to your life by watching funny movies or shows. Make time for activities that you enjoy and that relax you. Spend less time using electronics, especially at night before bed. The light from screens can make your brain think it is time to get up rather than go to bed.  Medicines Medicines, such as antidepressants, are often a part of treatment for depression. Talk with your pharmacist or health care provider about all the medicines, supplements, and herbal products that you  take, their possible side effects, and what medicines and other products are safe to take together. Make sure to report any side effects you may have to your health care provider. Relationships Your health care provider may suggest family therapy, couples therapy, or individual therapy as part of your treatment. How to recognize changes Everyone responds differently to treatment for depression. As you recover from depression, you may start to: Have more interest in doing activities. Feel more hopeful. Have more energy. Eat a more regular amount of food. Have better mental focus. It is important to recognize if your depression is not getting better or is getting worse. The symptoms you had in the beginning may return, such as: Feeling tired. Eating too much or too little. Sleeping too much or too little. Feeling restless, agitated, or hopeless. Trouble focusing or making decisions. Having unexplained aches and pains. Feeling irritable, angry, or aggressive. If you or your family members notice these  symptoms coming back, let your health care provider know right away. Follow these instructions at home: Activity Try to get some form of exercise each day, such as walking. Try yoga, mindfulness, or other stress reduction techniques. Participate in group activities if you are able. Lifestyle Get enough sleep. Cut down on or stop using caffeine, tobacco, alcohol , and any other harmful substances. Eat a healthy diet that includes plenty of vegetables, fruits, whole grains, low-fat dairy products, and lean protein. Limit foods that are high in solid fats, added sugar, or salt (sodium). General instructions Take over-the-counter and prescription medicines only as told by your health care provider. Keep all follow-up visits. It is important for your health care provider to check on your mood, behavior, and medicines. Your health care provider may need to make changes to your treatment. Where to  find support Talking to others  Friends and family members can be sources of support and guidance. Talk to trusted friends or family members about your condition. Explain your symptoms and let them know that you are working with a health care provider to treat your depression. Tell friends and family how they can help. Finances Find mental health providers that fit with your financial situation. Talk with your health care provider if you are worried about access to food, housing, or medicine. Call your insurance company to learn about your co-pays and prescription plan. Where to find more information You can find support in your area from: Anxiety and Depression Association of America (ADAA): adaa.org Mental Health America: mentalhealthamerica.net The First American on Mental Illness: nami.org Contact a health care provider if: You stop taking your antidepressant medicines, and you have any of these symptoms: Nausea. Headache. Light-headedness. Chills and body aches. Not being able to sleep (insomnia). You or your friends and family think your depression is getting worse. Get help right away if: You have thoughts of hurting yourself or others. Get help right away if you feel like you may hurt yourself or others, or have thoughts about taking your own life. Go to your nearest emergency room or: Call 911. Call the National Suicide Prevention Lifeline at 504-165-4233 or 988. This is open 24 hours a day. Text the Crisis Text Line at 709-133-3785. This information is not intended to replace advice given to you by your health care provider. Make sure you discuss any questions you have with your health care provider. Document Revised: 01/21/2022 Document Reviewed: 01/21/2022 Elsevier Patient Education  2024 [employer].

## 2024-04-21 ENCOUNTER — Encounter: Payer: Self-pay | Admitting: Family Medicine

## 2024-04-22 ENCOUNTER — Encounter: Payer: Self-pay | Admitting: Cardiology

## 2024-04-22 ENCOUNTER — Ambulatory Visit: Attending: Cardiology | Admitting: Cardiology

## 2024-04-22 ENCOUNTER — Ambulatory Visit

## 2024-04-22 VITALS — BP 152/75 | HR 51 | Ht 65.5 in | Wt 204.6 lb

## 2024-04-22 DIAGNOSIS — R600 Localized edema: Secondary | ICD-10-CM | POA: Diagnosis not present

## 2024-04-22 DIAGNOSIS — R42 Dizziness and giddiness: Secondary | ICD-10-CM | POA: Diagnosis not present

## 2024-04-22 DIAGNOSIS — I350 Nonrheumatic aortic (valve) stenosis: Secondary | ICD-10-CM | POA: Diagnosis not present

## 2024-04-22 DIAGNOSIS — R001 Bradycardia, unspecified: Secondary | ICD-10-CM

## 2024-04-22 DIAGNOSIS — I4510 Unspecified right bundle-branch block: Secondary | ICD-10-CM

## 2024-04-22 DIAGNOSIS — I10 Essential (primary) hypertension: Secondary | ICD-10-CM

## 2024-04-22 NOTE — Patient Instructions (Addendum)
 Medication Instructions:  NO CHANGES *If you need a refill on your cardiac medications before your next appointment, please call your pharmacy*  Lab Work: NO LABS If you have labs (blood work) drawn today and your tests are completely normal, you will receive your results only by: MyChart Message (if you have MyChart) OR A paper copy in the mail If you have any lab test that is abnormal or we need to change your treatment, we will call you to review the results.  Testing/Procedures:[address] Your physician has requested that you have an echocardiogram. Echocardiography is a painless test that uses sound waves to create images of your heart. It provides your doctor with information about the size and shape of your heart and how well your heart's chambers and valves are working. This procedure takes approximately one hour. There are no restrictions for this procedure. Please do NOT wear cologne, perfume, aftershave, or lotions (deodorant is allowed). Please arrive 15 minutes prior to your appointment time.  Please note: We ask at that you not bring children with you during ultrasound (echo/ vascular) testing. Due to room size and safety concerns, children are not allowed in the ultrasound rooms during exams. Our front office staff cannot provide observation of children in our lobby area while testing is being conducted. An adult accompanying a patient to their appointment will only be allowed in the ultrasound room at the discretion of the ultrasound technician under special circumstances. We apologize for any inconvenience.   [address] Your physician has requested that you have a carotid duplex. This test is an ultrasound of the carotid arteries in your neck. It looks at blood flow through these arteries that supply the brain with blood. Allow one hour for this exam. There are no restrictions or special instructions.    ZIO XT- Long Term Monitor Instructions  Your physician has  requested you wear a ZIO patch monitor for 7 days.  This is a single patch monitor. [employer] supplies one patch monitor per enrollment. Additional stickers are not available. Please do not apply patch if you will be having a Nuclear Stress Test,  Echocardiogram, Cardiac CT, MRI, or Chest Xray during the period you would be wearing the  monitor. The patch cannot be worn during these tests. You cannot remove and re-apply the  ZIO XT patch monitor.  Your ZIO patch monitor will be mailed 3 day USPS to your address on file. It may take 3-5 days  to receive your monitor after you have been enrolled.  Once you have received your monitor, please review the enclosed instructions. Your monitor  has already been registered assigning a specific monitor serial # to you.  Billing and Patient Assistance Program Information  We have supplied [employer] with any of your insurance information on file for billing purposes. [employer] offers a sliding scale Patient Assistance Program for patients that do not have  insurance, or whose insurance does not completely cover the cost of the ZIO monitor.  You must apply for the Patient Assistance Program to qualify for this discounted rate.  To apply, please call [employer] at [phone], select option 4, select option 2, ask to apply for  Patient Assistance Program. Meredeth will ask your household income, and how many people  are in your household. They will quote your out-of-pocket cost based on that information.  [employer] will also be able to set up a 24-month, interest-free payment plan if needed.  Applying the monitor   Shave hair from  upper left chest.  Hold abrader disc by orange tab. Rub abrader in 40 strokes over the upper left chest as  indicated in your monitor instructions.  Clean area with 4 enclosed alcohol  pads. Let dry.  Apply patch as indicated in monitor instructions. Patch will be placed under collarbone on left  side of chest with arrow pointing upward.   Rub patch adhesive wings for 2 minutes. Remove white label marked 1. Remove the white  label marked 2. Rub patch adhesive wings for 2 additional minutes.  While looking in a mirror, press and release button in center of patch. A small green light will  flash 3-4 times. This will be your only indicator that the monitor has been turned on.  Do not shower for the first 24 hours. You may shower after the first 24 hours.  Press the button if you feel a symptom. You will hear a small click. Record Date, Time and  Symptom in the Patient Logbook.  When you are ready to remove the patch, follow instructions on the last 2 pages of Patient  Logbook. Stick patch monitor onto the last page of Patient Logbook.  Place Patient Logbook in the blue and white box. Use locking tab on box and tape box closed  securely. The blue and white box has prepaid postage on it. Please place it in the mailbox as  soon as possible. Your physician should have your test results approximately 7 days after the  monitor has been mailed back to [facility].  Call [facility] Customer Care at [phone] if you have questions regarding  your ZIO XT patch monitor. Call them immediately if you see an orange light blinking on your  monitor.  If your monitor falls off in less than 4 days, contact our Monitor department at [phone].  If your monitor becomes loose or falls off after 4 days call [employer] at [phone] for  suggestions on securing your monitor   Follow-Up: At [facility], you and your health needs are our priority.  As part of our continuing mission to provide you with exceptional heart care, our providers are all part of one team.  This team includes your primary Cardiologist (physician) and Advanced Practice Providers or APPs (Physician Assistants and Nurse Practitioners) who all work together to provide you with the care you need, when you need it.  Your next appointment:   8-10  week(s)  Provider:   Katlyn West, NP

## 2024-04-22 NOTE — Progress Notes (Unsigned)
Applied a 7 day Zio XT monitor to patient in the office  Elizabeth Lin to read

## 2024-05-11 ENCOUNTER — Ambulatory Visit (HOSPITAL_COMMUNITY)
Admission: RE | Admit: 2024-05-11 | Discharge: 2024-05-11 | Disposition: A | Discharge status: Home / Self Care | Source: Ambulatory Visit | Attending: Family Medicine | Admitting: Family Medicine

## 2024-05-11 DIAGNOSIS — R42 Dizziness and giddiness: Secondary | ICD-10-CM | POA: Diagnosis not present

## 2024-05-11 DIAGNOSIS — R519 Headache, unspecified: Secondary | ICD-10-CM | POA: Diagnosis not present

## 2024-05-11 DIAGNOSIS — R001 Bradycardia, unspecified: Secondary | ICD-10-CM | POA: Diagnosis not present

## 2024-05-11 DIAGNOSIS — J341 Cyst and mucocele of nose and nasal sinus: Secondary | ICD-10-CM | POA: Diagnosis not present

## 2024-05-11 DIAGNOSIS — I6782 Cerebral ischemia: Secondary | ICD-10-CM | POA: Diagnosis not present

## 2024-05-11 DIAGNOSIS — I4510 Unspecified right bundle-branch block: Secondary | ICD-10-CM | POA: Diagnosis not present

## 2024-05-11 MED ORDER — GADOBUTROL 1 MMOL/ML IV SOLN
9.0000 mL | Freq: Once | INTRAVENOUS | Status: AC | PRN
  Administered 2024-05-11 (×2): 9 mL via INTRAVENOUS

## 2024-05-20 ENCOUNTER — Ambulatory Visit: Payer: Self-pay | Admitting: Family Medicine

## 2024-05-27 ENCOUNTER — Ambulatory Visit (HOSPITAL_BASED_OUTPATIENT_CLINIC_OR_DEPARTMENT_OTHER)
Admission: RE | Admit: 2024-05-27 | Discharge: 2024-05-27 | Disposition: A | Discharge status: Home / Self Care | Source: Ambulatory Visit | Attending: Cardiology | Admitting: Cardiology

## 2024-05-27 ENCOUNTER — Ambulatory Visit (HOSPITAL_COMMUNITY)
Admission: RE | Admit: 2024-05-27 | Discharge: 2024-05-27 | Disposition: A | Discharge status: Home / Self Care | Source: Ambulatory Visit | Attending: Cardiology | Admitting: Cardiology

## 2024-05-27 DIAGNOSIS — R600 Localized edema: Secondary | ICD-10-CM | POA: Diagnosis not present

## 2024-05-27 DIAGNOSIS — R42 Dizziness and giddiness: Secondary | ICD-10-CM | POA: Insufficient documentation

## 2024-05-27 LAB — ECHOCARDIOGRAM COMPLETE
AR max vel: 1.36 cm2
AV Area VTI: 1.4 cm2
AV Area mean vel: 1.51 cm2
AV Mean grad: 9 mmHg
AV Peak grad: 16.9 mmHg
Ao pk vel: 2.06 m/s
Area-P 1/2: 2.91 cm2
S' Lateral: 2.43 cm

## 2024-05-30 ENCOUNTER — Ambulatory Visit: Payer: Self-pay | Admitting: Cardiology

## 2024-05-31 NOTE — Telephone Encounter (Signed)
 Patient is looking for programs such as weight control, exercise classes or groups. Is there any programs such as these that we can refer her too?

## 2024-06-01 ENCOUNTER — Encounter: Payer: Self-pay | Admitting: Family Medicine

## 2024-06-01 ENCOUNTER — Ambulatory Visit: Admitting: Family Medicine

## 2024-06-01 ENCOUNTER — Ambulatory Visit (INDEPENDENT_AMBULATORY_CARE_PROVIDER_SITE_OTHER): Admitting: Family Medicine

## 2024-06-01 VITALS — BP 116/66 | HR 59 | Temp 98.0°F | Ht 64.75 in | Wt 203.6 lb

## 2024-06-01 DIAGNOSIS — R0600 Dyspnea, unspecified: Secondary | ICD-10-CM

## 2024-06-01 DIAGNOSIS — F32A Depression, unspecified: Secondary | ICD-10-CM

## 2024-06-01 DIAGNOSIS — I10 Essential (primary) hypertension: Secondary | ICD-10-CM | POA: Diagnosis not present

## 2024-06-01 DIAGNOSIS — E039 Hypothyroidism, unspecified: Secondary | ICD-10-CM | POA: Diagnosis not present

## 2024-06-01 DIAGNOSIS — R195 Other fecal abnormalities: Secondary | ICD-10-CM | POA: Diagnosis not present

## 2024-06-01 DIAGNOSIS — Z0000 Encounter for general adult medical examination without abnormal findings: Secondary | ICD-10-CM

## 2024-06-01 DIAGNOSIS — Z23 Encounter for immunization: Secondary | ICD-10-CM | POA: Diagnosis not present

## 2024-06-01 DIAGNOSIS — E785 Hyperlipidemia, unspecified: Secondary | ICD-10-CM

## 2024-06-01 MED ORDER — HYDROCHLOROTHIAZIDE 12.5 MG PO TABS
12.5000 mg | ORAL_TABLET | Freq: Every day | ORAL | 1 refills | Status: AC
Start: 2024-06-01 — End: ?

## 2024-06-01 MED ORDER — DULOXETINE HCL 20 MG PO CPEP: 40.0000 mg | ORAL_CAPSULE | Freq: Every day | ORAL | 1 refills | Status: AC

## 2024-06-01 MED ORDER — ATORVASTATIN CALCIUM 10 MG PO TABS: ORAL_TABLET | ORAL | 2 refills | Status: DC

## 2024-06-01 MED ORDER — LEVOTHYROXINE SODIUM 150 MCG PO TABS
150.0000 ug | ORAL_TABLET | Freq: Every day | ORAL | 3 refills | Status: AC
Start: 2024-06-01 — End: ?

## 2024-06-01 NOTE — Patient Instructions (Addendum)
 Glad to hear that the dizziness and head pressure has improved.   Medication refills were ordered.  Let's discuss the change in stools, urinary concerns and other concerns at your visit tomorrow and we will check fasting cholesterol level at that time.  Other labs were checked recently.  If there are other concerns, let us  know tomorrow and we will pick the  top concerns for now and can always address others at another visit if needed.  Thank you for coming in today.   [facility]  [phone]   Health Maintenance After Age 30 After age 33, you are at a higher risk for certain long-term diseases and infections as well as injuries from falls. Falls are a major cause of broken bones and head injuries in people who are older than age 45. Getting regular preventive care can help to keep you healthy and well. Preventive care includes getting regular testing and making lifestyle changes as recommended by your health care provider. Talk with your health care provider about: Which screenings and tests you should have. A screening is a test that checks for a disease when you have no symptoms. A diet and exercise plan that is right for you. What should I know about screenings and tests to prevent falls? Screening and testing are the best ways to find a health problem early. Early diagnosis and treatment give you the best chance of managing medical conditions that are common after age 18. Certain conditions and lifestyle choices may make you more likely to have a fall. Your health care provider may recommend: Regular vision checks. Poor vision and conditions such as cataracts can make you more likely to have a fall. If you wear glasses, make sure to get your prescription updated if your vision changes. Medicine review. Work with your health care provider to regularly review all of the medicines you are taking, including over-the-counter medicines. Ask your health  care provider about any side effects that may make you more likely to have a fall. Tell your health care provider if any medicines that you take make you feel dizzy or sleepy. Strength and balance checks. Your health care provider may recommend certain tests to check your strength and balance while standing, walking, or changing positions. Foot health exam. Foot pain and numbness, as well as not wearing proper footwear, can make you more likely to have a fall. Screenings, including: Osteoporosis screening. Osteoporosis is a condition that causes the bones to get weaker and break more easily. Blood pressure screening. Blood pressure changes and medicines to control blood pressure can make you feel dizzy. Depression screening. You may be more likely to have a fall if you have a fear of falling, feel depressed, or feel unable to do activities that you used to do. Alcohol  use screening. Using too much alcohol  can affect your balance and may make you more likely to have a fall. Follow these instructions at home: Lifestyle Do not drink alcohol  if: Your health care provider tells you not to drink. If you drink alcohol : Limit how much you have to: 0-1 drink a day for women. 0-2 drinks a day for men. Know how much alcohol  is in your drink. In the [place] one drink equals one 12 oz bottle of beer (355 mL), one 5 oz glass of wine (148 mL), or one 1 oz glass of hard liquor (44 mL). Do not use any products that contain nicotine or tobacco. These products include cigarettes, chewing  tobacco, and vaping devices, such as e-cigarettes. If you need help quitting, ask your health care provider. Activity  Follow a regular exercise program to stay fit. This will help you maintain your balance. Ask your health care provider what types of exercise are appropriate for you. If you need a cane or walker, use it as recommended by your health care provider. Wear supportive shoes that have nonskid soles. Safety  Remove  any tripping hazards, such as rugs, cords, and clutter. Install safety equipment such as grab bars in bathrooms and safety rails on stairs. Keep rooms and walkways well-lit. General instructions Talk with your health care provider about your risks for falling. Tell your health care provider if: You fall. Be sure to tell your health care provider about all falls, even ones that seem minor. You feel dizzy, tiredness (fatigue), or off-balance. Take over-the-counter and prescription medicines only as told by your health care provider. These include supplements. Eat a healthy diet and maintain a healthy weight. A healthy diet includes low-fat dairy products, low-fat (lean) meats, and fiber from whole grains, beans, and lots of fruits and vegetables. Stay current with your vaccines. Schedule regular health, dental, and eye exams. Summary Having a healthy lifestyle and getting preventive care can help to protect your health and wellness after age 49. Screening and testing are the best way to find a health problem early and help you avoid having a fall. Early diagnosis and treatment give you the best chance for managing medical conditions that are more common for people who are older than age 66. Falls are a major cause of broken bones and head injuries in people who are older than age 17. Take precautions to prevent a fall at home. Work with your health care provider to learn what changes you can make to improve your health and wellness and to prevent falls. This information is not intended to replace advice given to you by your health care provider. Make sure you discuss any questions you have with your health care provider. Document Revised: 02/04/2021 Document Reviewed: 02/04/2021 [employer] Patient Education  2024 [employer].

## 2024-06-01 NOTE — Progress Notes (Signed)
 Subjective:  Patient ID: Elizabeth Lin, female    DOB: 1946/04/15  Age: 78 y.o. MRN: [id number]  CC:  Chief Complaint  Patient presents with   Annual Exam    Pt is doing well, notes she is struggling to maintain her weight pt would like a referral to healthy [facility]     HPI Elizabeth Lin presents for Annual Exam  PCP, [facility], Dr. Anner with recent visit by Katlyn West for dizziness in July.  Zio patch ordered, as well as echo.  Hypertension on HCTZ, encouraged to increase fluid intake, compression stockings and slow position changes.  Prior mild aortic stenosis on echo in 2024.  56-month follow-up planned. Echo 8/29 -overall reassuring.  EF 60 to 65%, no regional wall motion abnormalities.  Mild concentric LVH.  Grade 1 diastolic dysfunction.  Aortic valve sclerosis without evidence of stenosis.  Mild regurgitation. Carotid duplex showed near normal carotid arteries with only minimal wall thickening. Zio patch was completed.  Report indicated predominant rhythm was sinus rhythm, first-degree AV block, bundle branch block present.  1 run of SVT lasting 5 beats max rate of 103.  Isolated SVE's were rare, SVE couplets were rare and no SVE triplets.  Isolated VE's were rare and no VE couplets or VE triplets. Ortho - Dr. Kay for shoulder pain. Also seen by Dr. Arnaldo for hip pain.  Podiatry Dr. Johnita App. R midfoot arthrtis, onychomycosis  Dizziness improved with less meds. Feels better. Headaches have improved. Reassuring MRI brain in August.   On hydrochlorothiazide  12.5mg  every day now.  Synthroid  150mcg every day.   Lab Results  Component Value Date   NA 137 04/08/2024   K 3.7 04/08/2024   CL 97 04/08/2024   CO2 34 (H) 04/08/2024   Lab Results  Component Value Date   TSH 1.58 04/08/2024   Hyperlipidemia: Now up to 4 times per week lipitor - past month and a half. No side effects with this dose. Not fasting today. Has appt tomorrow - will check  labs then fasting.  Lab Results  Component Value Date   CHOL 219 (H) 07/24/2023   HDL 80.20 07/24/2023   LDLCALC 121 (H) 07/24/2023   TRIG 89.0 07/24/2023   CHOLHDL 3 07/24/2023   Lab Results  Component Value Date   ALT 19 04/08/2024   AST 23 04/08/2024   ALKPHOS 81 04/08/2024   BILITOT 0.6 04/08/2024   Depression: Feels like depression stable on current dose Cymbalta . On 40mg  total per day. Prior 60mg  - feels same on lower dose - wants to stay on that dose.      06/01/2024    3:55 PM 08/20/2023   12:25 PM 07/24/2023    9:32 AM 03/26/2023   11:45 AM 01/15/2023    3:25 PM  Depression screen PHQ 2/9  Decreased Interest 0 2 0 0 0  Down, Depressed, Hopeless 2 1 3  0 1  PHQ - 2 Score 2 3 3  0 1  Altered sleeping 2 3 3  0 1  Tired, decreased energy 2 3 2  0 2  Change in appetite 2 3 3  0 3  Feeling bad or failure about yourself  0 2 0 0 0  Trouble concentrating 2 1 0 0 1  Moving slowly or fidgety/restless 0 0 0 0 0  Suicidal thoughts 0 0 0 0 0  PHQ-9 Score 10 15 11  0 8  Difficult doing work/chores Somewhat difficult  Somewhat difficult Not difficult at all Not  difficult at all     Health Maintenance  Topic Date Due   Zoster Vaccines- Shingrix (1 of 2) Never done   Medicare Annual Wellness (AWV)  03/25/2024   INFLUENZA VACCINE  04/29/2024   COVID-19 Vaccine (5 - 2025-26 season) 05/30/2024   Colonoscopy  02/10/2029   DTaP/Tdap/Td (3 - Td or Tdap) 08/16/2033   Pneumococcal Vaccine: 50+ Years  Completed   DEXA SCAN  Completed   Hepatitis C Screening  Completed   HPV VACCINES  Aged Out   Meningococcal B Vaccine  Aged Out  Mammogram July 2024. Plans to repeat.   Immunization History  Administered Date(s) Administered   Fluad Quad(high Dose 65+) 07/22/2021   Fluad Trivalent(High Dose 65+) 07/24/2023   INFLUENZA, HIGH DOSE SEASONAL PF 08/06/2022   Influenza, Seasonal, Injecte, Preservative Fre 11/05/2012   Influenza,inj,Quad PF,6+ Mos 06/21/2013, 09/01/2014    Influenza-Unspecified 06/21/2016, 06/15/2017   PFIZER Comirnaty(Gray Top)Covid-19 Tri-Sucrose Vaccine 05/13/2020, 06/03/2020   PFIZER(Purple Top)SARS-COV-2 Vaccination 05/30/2020, 06/03/2020   PNEUMOCOCCAL CONJUGATE-20 07/24/2023   Pneumococcal Conjugate-13 09/04/2016   Tdap 03/16/2012, 08/17/2023  Flu vaccine today.   No results found. Optho - wears glasses, regular visits, cataract surgery a year and a half ago.   Dental: every 6 months.   Alcohol : rare less than 1-2 per week.   Tobacco: none. Quit 40 years ago.   Exercise: 2 times per week for with trainer, and daily walking .  Plans to contact [facility] for some trouble managing weight.   Ankle swelling and pain at times on R with swelling - plans to follow up with ortho. No new calf pain/swelling.   Change in stools Past 6 months - softer stools, yellow color at times, then back to normal. Some increased gas at times.  Seen by GI in 2021  for soft stools - possible mild IBS or lactose intolerance.  Last colonoscopy about 4 years ago? Also other concerns - plans on follow up visit with tomorrow to review further. - bowel issues and urinary concerns.   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   Sensorineural hearing loss (SNHL), bilateral 04/27/2019   Meniere disease, left 12/12/2017   Mild aortic stenosis 11/10/2017   Bilateral edema of lower extremity 11/10/2017   Family history of premature CAD 11/10/2017   Arthralgia of multiple joints 10/23/2017   Class 1 obesity due to excess calories without serious comorbidity with body mass  index (BMI) of 32.0 to 32.9 in adult 10/23/2017   Essential hypertension 10/23/2017   Right bundle branch block (RBBB) 10/23/2017   Pure hypercholesterolemia 10/23/2017   Vitamin D  deficiency 09/06/2014   Right knee pain 02/24/2013   Hypothyroid 11/02/2011   Depression 11/02/2011   History of positive PPD 11/02/2011   Past Medical History:  Diagnosis Date   Allergy    Anxiety    Arthritis    Carpal tunnel syndrome    right hand, getting injections   Cataract    Depression    Dysrhythmia    BBB   Hammer toe    Hypothyroidism due to Hashimoto's thyroiditis    Meniere disease, right  Past Surgical History:  Procedure Laterality Date   APPENDECTOMY     COLONOSCOPY     CYSTECTOMY     eye   LAPAROSCOPIC ASSISTED SPIGELIAN HERNIA REPAIR N/A 11/08/2019   Procedure: LAPAROSCOPIC ASSISTED SPIGELIAN HERNIA REPAIR WITH MESH;  Surgeon: Belinda Cough, MD;  Location: [facility];  Service: General;  Laterality: N/A;   OOPHORECTOMY Right    TONSILLECTOMY     TRANSTHORACIC ECHOCARDIOGRAM  12/09/2019   EF 60 to 65%.  No R WMA.  GR 1 DD.  Normal RV function.  Aortic calcification with sclerosis but no suggestion of stenosis.  (Mean gradient 8 mmHg)   Allergies  Allergen Reactions   Penicillins Rash    Did it involve swelling of the face/tongue/throat, SOB, or low BP? No Did it involve sudden or severe rash/hives, skin peeling, or any reaction on the inside of your mouth or nose? Yes Did you need to seek medical attention at a hospital or doctor's office? No When did it last happen?  ~6-10 years ago     If all above answers are NO, may proceed with cephalosporin use.    Amoxicillin-Pot Clavulanate     Red rash Did it involve swelling of the face/tongue/throat, SOB, or low BP? No Did it involve sudden or severe rash/hives, skin peeling, or any reaction on the inside of your mouth or nose? Yes Did you need to seek medical attention at a hospital or doctor's office? No When did it last  happen? ~6-10 years ago    If all above answers are "NO", may proceed with cephalosporin use.    Sulfa Antibiotics    Prior to Admission medications   Medication Sig Start Date End Date Taking? Authorizing Provider  acetaminophen  (TYLENOL ) 500 MG tablet Take by mouth.   Yes [provider]  ascorbic acid (VITAMIN C) 1000 MG tablet Take by mouth.   Yes [provider]  atorvastatin  (LIPITOR) 10 MG tablet 1 Tablet by mouth FOUR times a week 03/28/24  Yes Levora Reyes SAUNDERS, MD  Calcium -Vitamin D -Vitamin K (CALCIUM  + D + K PO) Take 1 tablet by mouth 2 (two) times daily. 1300 mg+D1000 IU   Yes [provider]  ciclopirox  (PENLAC ) 8 % solution Apply topically at bedtime. Apply over nail. Apply daily over previous coat. Remove weekly with polish remover. 10/20/23  Yes McCaughan, Dia D, DPM  DULoxetine  (CYMBALTA ) 20 MG capsule TAKE 2 CAPSULES BY MOUTH EVERY DAY 08/21/23  Yes Levora Reyes SAUNDERS, MD  DULoxetine  (CYMBALTA ) 60 MG capsule Take 1 capsule (60 mg total) by mouth daily. 07/24/23  Yes Levora Reyes SAUNDERS, MD  fish oil-omega-3 fatty acids 1000 MG capsule Take 1 g by mouth 2 (two) times daily.    Yes [provider]  hydrochlorothiazide  (HYDRODIURIL ) 12.5 MG tablet Take 1 tablet (12.5 mg total) by mouth daily. 04/08/24  Yes Levora Reyes SAUNDERS, MD  ibuprofen (ADVIL) 200 MG tablet Take by mouth.   Yes [provider]  ketoconazole  (NIZORAL ) 2 % shampoo APPLY 1 APPLICATION TOPICALLY ONCE A WEEK. 10/27/22  Yes Levora Reyes SAUNDERS, MD  levothyroxine  (SYNTHROID ) 150 MCG tablet TAKE 1 TABLET BY MOUTH EVERY DAY 07/23/23  Yes Levora Reyes SAUNDERS, MD  meclizine  (ANTIVERT ) 25 MG tablet Take 1 tablet (25 mg total) by mouth 3 (three) times daily as needed for dizziness. 04/08/24  Yes Levora Reyes SAUNDERS, MD  naproxen  sodium (ALEVE ) 220 MG tablet Take 220-440 mg by mouth 2 (two) times daily as needed (pain/soreness).  Yes [provider]  ofloxacin (OCUFLOX) 0.3 %  ophthalmic solution Apply to eye.   Yes [provider]  Polyethyl Glycol-Propyl Glycol (LUBRICANT EYE DROPS) 0.4-0.3 % SOLN Place 1 drop into both eyes 3 (three) times daily as needed (dry/irritated eyes.). Rohto Ice Multi-Symptom Relief   Yes [provider]  Vitamin E (VITAMIN E/D-ALPHA NATURAL) 268 MG (400 UNIT) CAPS Take by mouth.   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  3 children, 3 grandchildren.   Patient's  Education: [employer].  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   Patient is right handed   Caffeine consumption 2-3 daily    Walks daily for roughly 30 minutes at a time.  She has not been doing it in the wintertime due to cold weather.      Social Drivers of Corporate investment banker Strain: Low Risk  (03/26/2023)   Overall Financial Resource Strain (CARDIA)    Difficulty of Paying Living Expenses: Not hard at all  Food Insecurity: Low Risk  (06/16/2023)   Received from [facility]   Hunger Vital Sign    Within the past 12 months, you worried that your food would run out before you got money to buy more: Never true     Within the past 12 months, the food you bought just didn't last and you didn't have money to get more. : Never true  Transportation Needs: No Transportation Needs (06/16/2023)   Received from [facility]    In the past 12 months, has lack of reliable transportation kept you from medical appointments, meetings, work or from getting things needed for daily living? : No  Physical Activity: Insufficiently Active (03/26/2023)   Exercise Vital Sign    Days of Exercise per Week: 2 days    Minutes of Exercise per Session: 30 min  Stress: No Stress Concern Present (03/26/2023)   [employer] of Occupational Health - Occupational Stress Questionnaire    Feeling of Stress : Not at all  Social Connections: Socially Isolated (03/26/2023)   Social Connection and Isolation Panel    Frequency of Communication with Friends and Family: Three times a week    Frequency of Social Gatherings with Friends and Family: Three times a week    Attends Religious Services: Never    Active Member of Clubs or Organizations: No    Attends Banker Meetings: Never    Marital Status: Divorced  Catering manager Violence: Not At Risk (03/26/2023)   Humiliation, Afraid, Rape, and  Kick questionnaire    Fear of Current or Ex-Partner: No    Emotionally Abused: No    Physically Abused: No    Sexually Abused: No    Review of Systems 13 point review of systems per patient health survey noted.  Negative other than as indicated above or in HPI.    Objective:   Vitals:   06/01/24 1550  BP: 116/66  Pulse: (!) 59  Temp: 98 F (36.7 C)  TempSrc: Temporal  SpO2: 96%  Weight: 203 lb 9.6 oz (92.4 kg)  Height: 5' 4.75 (1.645 m)     Physical Exam Vitals reviewed.  Constitutional:      Appearance: She is well-developed.  HENT:     Head: Normocephalic and atraumatic.     Right Ear: External ear normal.     Left Ear: External ear normal.  Eyes:     Conjunctiva/sclera: Conjunctivae  normal.     Pupils: Pupils are equal, round, and reactive to light.  Neck:     Thyroid : No thyromegaly.  Cardiovascular:     Rate and Rhythm: Normal rate and regular rhythm.     Heart sounds: Normal heart sounds. No murmur heard. Pulmonary:     Effort: Pulmonary effort is normal. No respiratory distress.     Breath sounds: Normal breath sounds. No wheezing.  Abdominal:     General: Bowel sounds are normal.     Palpations: Abdomen is soft.     Tenderness: There is no abdominal tenderness.  Musculoskeletal:        General: No tenderness. Normal range of motion.     Cervical back: Normal range of motion and neck supple.  Lymphadenopathy:     Cervical: No cervical adenopathy.  Skin:    General: Skin is warm and dry.     Findings: No rash.  Neurological:     Mental Status: She is alert and oriented to person, place, and time.  Psychiatric:        Behavior: Behavior normal.        Thought Content: Thought content normal.        Assessment & Plan:  Elizabeth Lin is a 78 y.o. female . Annual physical exam  - -anticipatory guidance as below in AVS, screening labs above. Health maintenance items as above in HPI discussed/recommended as applicable.   Depression, unspecified depression type - Plan: DULoxetine  (CYMBALTA ) 20 MG capsule  -  Stable, tolerating current regimen. Medications refilled.   Hyperlipidemia, unspecified hyperlipidemia type - Plan: atorvastatin  (LIPITOR) 10 MG tablet, Lipid panel  -  Stable, tolerating current regimen. Medications refilled. Labs pending as above.   Dyspnea, unspecified type - Plan: hydrochlorothiazide  (HYDRODIURIL ) 12.5 MG tablet Essential hypertension - Plan: hydrochlorothiazide  (HYDRODIURIL ) 12.5 MG tablet  - Stable, tolerating current regimen. Medications refilled. Labs pending - will check fasting at follow up.   Hypothyroidism, unspecified type - Plan: levothyroxine  (SYNTHROID ) 150 MCG tablet  - Same regimen for now with labs planned  as above.  Change in stool  - To discuss further at follow-up visit.  Needs flu shot - Plan: Flu vaccine HIGH DOSE PF(Fluzone Trivalent)   Meds ordered this encounter  Medications   DULoxetine  (CYMBALTA ) 20 MG capsule    Sig: Take 2 capsules (40 mg total) by mouth daily.    Dispense:  180 capsule    Refill:  1   atorvastatin  (LIPITOR) 10 MG tablet    Sig: 1 Tablet by mouth FOUR times a week    Dispense:  36 tablet    Refill:  2   hydrochlorothiazide  (HYDRODIURIL ) 12.5 MG tablet    Sig: Take 1 tablet (12.5 mg total) by mouth daily.    Dispense:  90 tablet    Refill:  1   levothyroxine  (SYNTHROID ) 150 MCG tablet    Sig: Take 1 tablet (150 mcg total) by mouth daily.    Dispense:  90 tablet    Refill:  3   Patient Instructions  Glad to hear that the dizziness and head pressure has improved.   Medication refills were ordered.  Let's discuss the change in stools, urinary concerns and other concerns at your visit tomorrow and we will check fasting cholesterol level at that time.  Other labs were checked recently.  If there are other concerns, let us  know tomorrow and we will pick the  top concerns for now and can always address others at another visit if needed.  Thank you for coming in today.   [facility]  [phone]   Health Maintenance After Age 24 After age 43, you are at a higher risk for certain long-term diseases and infections as well as injuries from falls. Falls are a major cause of broken bones and head injuries in people who are older than age 57. Getting regular preventive care can help to keep you healthy and well. Preventive care includes getting regular testing and making lifestyle changes as recommended by your health care provider. Talk with your health care provider about: Which screenings and tests you should have. A screening is a test that checks for a disease when you have no symptoms. A diet and exercise plan  that is right for you. What should I know about screenings and tests to prevent falls? Screening and testing are the best ways to find a health problem early. Early diagnosis and treatment give you the best chance of managing medical conditions that are common after age 32. Certain conditions and lifestyle choices may make you more likely to have a fall. Your health care provider may recommend: Regular vision checks. Poor vision and conditions such as cataracts can make you more likely to have a fall. If you wear glasses, make sure to get your prescription updated if your vision changes. Medicine review. Work with your health care provider to regularly review all of the medicines you are taking, including over-the-counter medicines. Ask your health care provider about any side effects that may make you more likely to have a fall. Tell your health care provider if any medicines that you take make you feel dizzy or sleepy. Strength and balance checks. Your health care provider may recommend certain tests to check your strength and balance while standing, walking, or changing positions. Foot health exam. Foot pain and numbness, as well as not wearing proper footwear, can make you more likely to have a fall. Screenings, including: Osteoporosis screening. Osteoporosis is a condition that causes the bones to get weaker and break more easily. Blood pressure screening. Blood pressure changes and medicines to control blood pressure can make you feel dizzy. Depression screening. You may be more likely to have a fall if you have a fear of falling, feel depressed, or feel unable to do activities that you used to do. Alcohol  use screening. Using too much alcohol  can affect your balance and may make you more likely to have a fall. Follow these instructions at home: Lifestyle Do not drink alcohol  if: Your health care provider tells you not  to drink. If you drink alcohol : Limit how much you have to: 0-1 drink a  day for women. 0-2 drinks a day for men. Know how much alcohol  is in your drink. In the [place] one drink equals one 12 oz bottle of beer (355 mL), one 5 oz glass of wine (148 mL), or one 1 oz glass of hard liquor (44 mL). Do not use any products that contain nicotine or tobacco. These products include cigarettes, chewing tobacco, and vaping devices, such as e-cigarettes. If you need help quitting, ask your health care provider. Activity  Follow a regular exercise program to stay fit. This will help you maintain your balance. Ask your health care provider what types of exercise are appropriate for you. If you need a cane or walker, use it as recommended by your health care provider. Wear supportive shoes that have nonskid soles. Safety  Remove any tripping hazards, such as rugs, cords, and clutter. Install safety equipment such as grab bars in bathrooms and safety rails on stairs. Keep rooms and walkways well-lit. General instructions Talk with your health care provider about your risks for falling. Tell your health care provider if: You fall. Be sure to tell your health care provider about all falls, even ones that seem minor. You feel dizzy, tiredness (fatigue), or off-balance. Take over-the-counter and prescription medicines only as told by your health care provider. These include supplements. Eat a healthy diet and maintain a healthy weight. A healthy diet includes low-fat dairy products, low-fat (lean) meats, and fiber from whole grains, beans, and lots of fruits and vegetables. Stay current with your vaccines. Schedule regular health, dental, and eye exams. Summary Having a healthy lifestyle and getting preventive care can help to protect your health and wellness after age 44. Screening and testing are the best way to find a health problem early and help you avoid having a fall. Early diagnosis and treatment give you the best chance for managing medical conditions that are more common  for people who are older than age 65. Falls are a major cause of broken bones and head injuries in people who are older than age 63. Take precautions to prevent a fall at home. Work with your health care provider to learn what changes you can make to improve your health and wellness and to prevent falls. This information is not intended to replace advice given to you by your health care provider. Make sure you discuss any questions you have with your health care provider. Document Revised: 02/04/2021 Document Reviewed: 02/04/2021 Elsevier Patient Education  2024 Elsevier Inc.    Signed,   Reyes Pines, MD [facility], [facility] 06/01/24 4:20 PM

## 2024-06-02 ENCOUNTER — Encounter: Payer: Self-pay | Admitting: Family Medicine

## 2024-06-02 ENCOUNTER — Ambulatory Visit: Payer: Self-pay | Admitting: Family Medicine

## 2024-06-02 ENCOUNTER — Ambulatory Visit: Admitting: Family Medicine

## 2024-06-02 VITALS — BP 126/70 | HR 67 | Temp 98.0°F | Resp 16 | Ht 64.75 in | Wt 203.6 lb

## 2024-06-02 DIAGNOSIS — R195 Other fecal abnormalities: Secondary | ICD-10-CM | POA: Diagnosis not present

## 2024-06-02 DIAGNOSIS — N3946 Mixed incontinence: Secondary | ICD-10-CM | POA: Diagnosis not present

## 2024-06-02 DIAGNOSIS — R350 Frequency of micturition: Secondary | ICD-10-CM

## 2024-06-02 DIAGNOSIS — E785 Hyperlipidemia, unspecified: Secondary | ICD-10-CM | POA: Diagnosis not present

## 2024-06-02 LAB — POCT URINALYSIS DIPSTICK
Bilirubin, UA: NEGATIVE
Glucose, UA: NEGATIVE
Ketones, UA: NEGATIVE
Leukocytes, UA: NEGATIVE
Nitrite, UA: NEGATIVE
Protein, UA: NEGATIVE
Spec Grav, UA: 1.01 (ref 1.010–1.025)
Urobilinogen, UA: 0.2 U/dL
pH, UA: 6.5 (ref 5.0–8.0)

## 2024-06-02 LAB — LIPID PANEL
Cholesterol: 208 mg/dL — ABNORMAL HIGH (ref 0–200)
HDL: 79.9 mg/dL (ref >39.00)
LDL Cholesterol: 106 mg/dL — ABNORMAL HIGH (ref 0–99)
NonHDL: 127.61
Total CHOL/HDL Ratio: 3
Triglycerides: 106 mg/dL (ref 0.0–149.0)
VLDL: 21.2 mg/dL (ref 0.0–40.0)

## 2024-06-02 NOTE — Patient Instructions (Addendum)
 I will refer you to gastroenterology for bowel changes.  Progressive kegel exercises during the day.  We can try a med to help with symptoms if needed, but will refer you to urology. Let me know if you would like to try a med before seeing them.   Take care.   Kegel Exercises  Kegel exercises can help strengthen your pelvic floor muscles. The pelvic floor is a group of muscles that support your rectum, small intestine, and bladder. In females, pelvic floor muscles also help support the uterus. These muscles help you control the flow of urine and stool (feces). Kegel exercises are painless and simple. They do not require any equipment. Your provider may suggest Kegel exercises to: Improve bladder and bowel control. Improve sexual response. Improve weak pelvic floor muscles after surgery to remove the uterus (hysterectomy) or after pregnancy, in females. Improve weak pelvic floor muscles after prostate gland removal or surgery, in males. Kegel exercises involve squeezing your pelvic floor muscles. These are the same muscles you squeeze when you try to stop the flow of urine or keep from passing gas. The exercises can be done while sitting, standing, or lying down, but it is best to vary your position. Ask your health care provider which exercises are safe for you. Do exercises exactly as told by your health care provider and adjust them as directed. Do not begin these exercises until told by your health care provider. Exercises How to do Kegel exercises: Squeeze your pelvic floor muscles tight. You should feel a tight lift in your rectal area. If you are a female, you should also feel a tightness in your vaginal area. Keep your stomach, buttocks, and legs relaxed. Hold the muscles tight for up to 10 seconds. Breathe normally. Relax your muscles for up to 10 seconds. Repeat as told by your health care provider. Repeat this exercise daily as told by your health care provider. Continue to do this  exercise for at least 4-6 weeks, or for as long as told by your health care provider. You may be referred to a physical therapist who can help you learn more about how to do Kegel exercises. Depending on your condition, your health care provider may recommend: Varying how long you squeeze your muscles. Doing several sets of exercises every day. Doing exercises for several weeks. Making Kegel exercises a part of your regular exercise routine. This information is not intended to replace advice given to you by your health care provider. Make sure you discuss any questions you have with your health care provider. Document Revised: 01/24/2021 Document Reviewed: 01/24/2021 Elsevier Patient Education  2024 Elsevier Inc. Urinary Incontinence: What to Know Urinary incontinence, or UI, happens if you can't always control when you pee. If you think you have UI, it's important to talk to your health care provider. There're things that can be done to help treat your problem. What are the different types of UI? There are many types of UI. You may have more than one type: Stress UI. You pee all of a sudden when doing activities such exercising. You may also pee when you cough or laugh. Urge UI. You have a sudden urge to pee and you begin peeing before you get to a bathroom. Overflow UI. You feel the need to pee often but only pee a small amount. You also always leak urine. Functional UI. You pee when you can't get to the bathroom in time due to: A physical problem like a bladder injury or  arthritis. A disease that affects the way your brain works, like Alzheimer's disease. Nocturnal UI. You pee while you sleep. What are the causes?     Medicines. Infections. Trouble pooping (constipation). Bladder muscles that squeeze too much or too soon (overactive bladder). Weak bladder muscles. Weak pelvic floor muscles. These muscles provide support for the bladder, intestines, and the uterus. Enlarged prostate  in males. The prostate sits around the tube that drains pee from the bladder (urethra). If the prostate gets big, it can pinch the urethra. If the urethra is blocked, the bladder gets weak and can't empty all the way. Surgery. Diseases that affect the nerves or spinal cord. What puts you at risk for UI? Age. The older you are, the higher the risk. Being overweight or having obesity. Being inactive and not getting much exercise. Pregnancy and childbirth. Menopause. Long-term coughing. Treatment for prostate cancer. How is UI diagnosed? A physical exam. Pee tests. X-rays of your kidneys or bladder. Ultrasound or CT scan. Cystoscopy. In this procedure, a provider inserts a tube with a light and camera (cystoscope) through the urethra and into the bladder to check for problems. Urodynamic testing. These tests check how well the bladder can store and release pee. Your provider may ask you to write down your symptoms. You may be asked to write when you pee and how much you pee. Where to find more information [employer] of Diabetes and Digestive and Kidney Diseases: [employer]li[employer]tn American Urology Association: www.urologyhealth.org This information is not intended to replace advice given to you by your health care provider. Make sure you discuss any questions you have with your health care provider. Document Revised: 08/19/2023 Document Reviewed: 05/22/2023 Elsevier Patient Education  2024 [employer].

## 2024-06-02 NOTE — Progress Notes (Signed)
 Subjective:  Patient ID: Elizabeth Lin, female    DOB: Mar 17, 1946  Age: 78 y.o. MRN: [id number]  CC:  Chief Complaint  Patient presents with   Labs Only    Discuss doing lab work today as well,    Dizziness    Dizziness has improved, notes this is most prominent in the mornings    Nocturia    Pt notes increased frequency in urination.    Health Maintenance    Patient brought in previous Colonoscopy paper work from 2015 to scan in     HPI Elizabeth Lin presents for follow-up, physical yesterday, additional concerns and labs planned today.  Dizziness: See visit yesterday.  Symptoms have improved.  Prior head pressure but MRI without acute concerns and headaches, pressure head has improved.  Overall reassuring cardiology workup with Zio patch, carotid duplex, and echo.  Energy is better.   Change in stool Briefly discussed at her physical yesterday.  She notes over the past 6 months or so she has experienced softer stools, with yellow appearing stool at times then back to normal dark brown.  Increased gas at times.  On chart review she was seen by gastroenterology back in 2021 for soft stools and similar symptoms, thought to be possible mild IBS or lactose intolerance, possible SIBO at that time. Last colonoscopy in 2021? - reports no polyps, repeat - 10 years.  No blood in stool, no unexplained weight loss.   Urinary frequency, intermittent incontinence Increased frequency during the day - past year. No dysuria.  No hematuria. Some urinary incontinence for over a year, but more in past year.  Some incontinence with laughing/coughing sneeze. More often not able to get to restroom in time. No prior meds for incontinence. Has tried kegel exercises for a few years - helps some. No retention symptoms.   Fasting labs Plan for lipid panel today.  TSH, CBC, CMP were obtained back in July and reassuring at that time. Fasting today.   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   Degenerative joint disease of shoulder region 11/09/2022   Excessive cerumen in ear canal, right 07/17/2020   Neck swelling 07/17/2020   Impacted cerumen of right ear 07/17/2020   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/08/2019   Sensorineural hearing loss (SNHL), bilateral 04/27/2019   Meniere disease, left 12/12/2017   Mild aortic stenosis 11/10/2017   Bilateral edema of lower extremity 11/10/2017   Family history of premature CAD 11/10/2017   Arthralgia of multiple joints 10/23/2017   Class 1 obesity due to excess calories without serious comorbidity with body mass index (BMI) of 32.0 to 32.9 in adult 10/23/2017   Essential hypertension 10/23/2017   Right bundle branch block (RBBB) 10/23/2017   Pure hypercholesterolemia 10/23/2017   Vitamin Lin  deficiency 09/06/2014   Right knee pain 02/24/2013   Hypothyroid 11/02/2011   Depression 11/02/2011   History of positive PPD 11/02/2011   Past Medical History:  Diagnosis Date   Allergy    Anxiety    Arthritis    Carpal tunnel syndrome    right hand, getting injections   Cataract    Depression    Dysrhythmia    BBB   Hammer toe    Hypothyroidism due to Hashimoto's thyroiditis    Meniere disease, right    Past Surgical History:  Procedure Laterality Date  APPENDECTOMY     COLONOSCOPY     CYSTECTOMY     eye   LAPAROSCOPIC ASSISTED SPIGELIAN HERNIA REPAIR N/A 11/08/2019   Procedure: LAPAROSCOPIC ASSISTED SPIGELIAN HERNIA REPAIR WITH MESH;  Surgeon: Belinda Cough, MD;  Location: [facility];  Service: General;  Laterality: N/A;   OOPHORECTOMY Right    TONSILLECTOMY     TRANSTHORACIC ECHOCARDIOGRAM  12/09/2019   EF 60 to 65%.  No R WMA.  GR 1 DD.  Normal RV function.  Aortic calcification with sclerosis but no suggestion of stenosis.  (Mean gradient 8 mmHg)   Allergies  Allergen Reactions    Penicillins Rash    Did it involve swelling of the face/tongue/throat, SOB, or low BP? No Did it involve sudden or severe rash/hives, skin peeling, or any reaction on the inside of your mouth or nose? Yes Did you need to seek medical attention at a hospital or doctor's office? No When did it last happen?  ~6-10 years ago     If all above answers are NO, may proceed with cephalosporin use.    Amoxicillin-Pot Clavulanate     Red rash Did it involve swelling of the face/tongue/throat, SOB, or low BP? No Did it involve sudden or severe rash/hives, skin peeling, or any reaction on the inside of your mouth or nose? Yes Did you need to seek medical attention at a hospital or doctor's office? No When did it last happen? ~6-10 years ago    If all above answers are "NO", may proceed with cephalosporin use.    Sulfa Antibiotics    Prior to Admission medications   Medication Sig Start Date End Date Taking? Authorizing Provider  acetaminophen  (TYLENOL ) 500 MG tablet Take by mouth.    [provider]  ascorbic acid (VITAMIN C) 1000 MG tablet Take by mouth.    [provider]  atorvastatin  (LIPITOR) 10 MG tablet 1 Tablet by mouth FOUR times a week 06/01/24   Elizabeth Reyes SAUNDERS, MD  Calcium -Vitamin Lin -Vitamin K (CALCIUM  + Lin + K PO) Take 1 tablet by mouth 2 (two) times daily. 1300 mg+D1000 IU    [provider]  ciclopirox  (PENLAC ) 8 % solution Apply topically at bedtime. Apply over nail. Apply daily over previous coat. Remove weekly with polish remover. 10/20/23   Elizabeth Lin, Elizabeth Lin, DPM  DULoxetine  (CYMBALTA ) 20 MG capsule Take 2 capsules (40 mg total) by mouth daily. 06/01/24   Elizabeth Reyes SAUNDERS, MD  fish oil-omega-3 fatty acids 1000 MG capsule Take 1 g by mouth 2 (two) times daily.     [provider]  hydrochlorothiazide  (HYDRODIURIL ) 12.5 MG tablet Take 1 tablet (12.5 mg total) by mouth daily. 06/01/24   Elizabeth Reyes SAUNDERS, MD  ibuprofen (ADVIL) 200 MG tablet Take by mouth.     [provider]  ketoconazole  (NIZORAL ) 2 % shampoo APPLY 1 APPLICATION TOPICALLY ONCE A WEEK. 10/27/22   Elizabeth Reyes SAUNDERS, MD  levothyroxine  (SYNTHROID ) 150 MCG tablet Take 1 tablet (150 mcg total) by mouth daily. 06/01/24   Elizabeth Reyes SAUNDERS, MD  meclizine  (ANTIVERT ) 25 MG tablet Take 1 tablet (25 mg total) by mouth 3 (three) times daily as needed for dizziness. 04/08/24   Elizabeth Reyes SAUNDERS, MD  naproxen  sodium (ALEVE ) 220 MG tablet Take 220-440 mg by mouth 2 (two) times daily as needed (pain/soreness).    [provider]  ofloxacin (OCUFLOX) 0.3 % ophthalmic solution Apply to eye.    [provider]  Polyethyl Glycol-Propyl Glycol (LUBRICANT EYE  DROPS) 0.4-0.3 % SOLN Place 1 drop into both eyes 3 (three) times daily as needed (dry/irritated eyes.). Rohto Ice Multi-Symptom Relief    [provider]  Vitamin E (VITAMIN E/Lin-ALPHA NATURAL) 268 MG (400 UNIT) CAPS Take by mouth.    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  3 children, 3 grandchildren.   Patient's  Education: [employer].  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    Patient is right handed   Caffeine consumption 2-3 daily    Walks daily for roughly 30 minutes at a time.  She has not been doing it in the wintertime due to cold weather.      Social Drivers of Corporate investment banker Strain: Low Risk  (03/26/2023)   Overall Financial Resource Strain (CARDIA)    Difficulty of Paying Living Expenses: Not hard at all  Food Insecurity: Low Risk  (06/16/2023)   Received from [facility]   Hunger Vital Sign    Within the past 12 months, you worried that your food would run out before you got money to buy more: Never true    Within the past 12 months, the food you bought just didn't last and you didn't have money to get more. : Never true  Transportation Needs: No Transportation Needs (06/16/2023)   Received from [facility]    In the past 12 months, has lack of reliable transportation kept you from medical appointments, meetings, work or from getting things needed for daily living? : No  Physical Activity: Insufficiently Active (03/26/2023)   Exercise Vital Sign    Days of Exercise per Week: 2 days    Minutes of Exercise per Session: 30 min  Stress: No Stress Concern Present (03/26/2023)   [employer] of Occupational Health - Occupational Stress Questionnaire    Feeling of Stress : Not at all  Social Connections: Socially Isolated (03/26/2023)   Social Connection and Isolation Panel    Frequency of Communication with Friends and Family: Three times a week    Frequency of Social Gatherings with Friends and Family: Three times a week    Attends Religious Services: Never    Active Member of Clubs or Organizations: No    Attends Banker Meetings: Never    Marital Status: Divorced  Catering manager Violence: Not At Risk (03/26/2023)   Humiliation, Afraid, Rape, and Kick questionnaire    Fear of Current or Ex-Partner: No    Emotionally Abused: No    Physically Abused: No    Sexually  Abused: No    Review of  Systems   Objective:   Vitals:   06/02/24 0914  BP: 126/70  Pulse: 67  Resp: 16  Temp: 98 F (36.7 C)  TempSrc: Temporal  SpO2: 97%  Weight: 203 lb 9.6 oz (92.4 kg)  Height: 5' 4.75 (1.645 m)     Physical Exam Vitals reviewed.  Constitutional:      Appearance: Normal appearance. She is well-developed.  HENT:     Head: Normocephalic and atraumatic.  Eyes:     Conjunctiva/sclera: Conjunctivae normal.     Pupils: Pupils are equal, round, and reactive to light.  Neck:     Vascular: No carotid bruit.  Cardiovascular:     Rate and Rhythm: Normal rate and regular rhythm.     Heart sounds: Normal heart sounds.  Pulmonary:     Effort: Pulmonary effort is normal.     Breath sounds: Normal breath sounds.  Abdominal:     General: There is no distension.     Palpations: Abdomen is soft. There is no pulsatile mass.     Tenderness: There is no abdominal tenderness. There is no guarding.  Musculoskeletal:     Right lower leg: No edema.     Left lower leg: No edema.  Skin:    General: Skin is warm and dry.  Neurological:     Mental Status: She is alert and oriented to person, place, and time.  Psychiatric:        Mood and Affect: Mood normal.        Behavior: Behavior normal.      Results for orders placed or performed in visit on 06/02/24  POCT urinalysis dipstick   Collection Time: 06/02/24 10:06 AM  Result Value Ref Range   Color, UA yellow    Clarity, UA clear    Glucose, UA Negative Negative   Bilirubin, UA Negative    Ketones, UA Negative    Spec Grav, UA 1.010 1.010 - 1.025   Blood, UA Trace    pH, UA 6.5 5.0 - 8.0   Protein, UA Negative Negative   Urobilinogen, UA 0.2 0.2 or 1.0 E.U./dL   Nitrite, UA Negative    Leukocytes, UA Negative Negative   Appearance     Odor       Assessment & Plan:  Elizabeth Lin is a 78 y.o. female . Change in stool - Plan: Ambulatory referral to Gastroenterology   - As above changes in stool color, consistency at  times and increased gas.  Possible IBS previously.  Does report a colonoscopy in 2021, unable to see that at this time.  Will refer back to gastroenterology for further evaluation and decision on testing.  Mixed incontinence - Plan: POCT urinalysis dipstick, Ambulatory referral to Urology, POCT urinalysis dipstick Urinary frequency - Plan: POCT urinalysis dipstick  - Appears to have mixed incontinence.  Urinalysis obtained.  Option to start medication but would be cautious with anticholinergics given her previous dizziness.  Meds deferred at this time, would like to meet with urology first.  Handout given, progressive Kegels discussed.  Denies retention symptoms.  RTC precautions given.  No orders of the defined types were placed in this encounter.  Patient Instructions  I will refer you to gastroenterology for bowel changes.  Progressive kegel exercises during the day.  We can try a med to help with symptoms if needed, but will refer you to urology. Let me know if you would like to try a med  before seeing them.   Take care.   Kegel Exercises  Kegel exercises can help strengthen your pelvic floor muscles. The pelvic floor is a group of muscles that support your rectum, small intestine, and bladder. In females, pelvic floor muscles also help support the uterus. These muscles help you control the flow of urine and stool (feces). Kegel exercises are painless and simple. They do not require any equipment. Your provider may suggest Kegel exercises to: Improve bladder and bowel control. Improve sexual response. Improve weak pelvic floor muscles after surgery to remove the uterus (hysterectomy) or after pregnancy, in females. Improve weak pelvic floor muscles after prostate gland removal or surgery, in males. Kegel exercises involve squeezing your pelvic floor muscles. These are the same muscles you squeeze when you try to stop the flow of urine or keep from passing gas. The exercises can be done  while sitting, standing, or lying down, but it is best to vary your position. Ask your health care provider which exercises are safe for you. Do exercises exactly as told by your health care provider and adjust them as directed. Do not begin these exercises until told by your health care provider. Exercises How to do Kegel exercises: Squeeze your pelvic floor muscles tight. You should feel a tight lift in your rectal area. If you are a female, you should also feel a tightness in your vaginal area. Keep your stomach, buttocks, and legs relaxed. Hold the muscles tight for up to 10 seconds. Breathe normally. Relax your muscles for up to 10 seconds. Repeat as told by your health care provider. Repeat this exercise daily as told by your health care provider. Continue to do this exercise for at least 4-6 weeks, or for as long as told by your health care provider. You may be referred to a physical therapist who can help you learn more about how to do Kegel exercises. Depending on your condition, your health care provider may recommend: Varying how long you squeeze your muscles. Doing several sets of exercises every day. Doing exercises for several weeks. Making Kegel exercises a part of your regular exercise routine. This information is not intended to replace advice given to you by your health care provider. Make sure you discuss any questions you have with your health care provider. Document Revised: 01/24/2021 Document Reviewed: 01/24/2021 Elsevier Patient Education  2024 Elsevier Inc. Urinary Incontinence: What to Know Urinary incontinence, or UI, happens if you can't always control when you pee. If you think you have UI, it's important to talk to your health care provider. There're things that can be done to help treat your problem. What are the different types of UI? There are many types of UI. You may have more than one type: Stress UI. You pee all of a sudden when doing activities such  exercising. You may also pee when you cough or laugh. Urge UI. You have a sudden urge to pee and you begin peeing before you get to a bathroom. Overflow UI. You feel the need to pee often but only pee a small amount. You also always leak urine. Functional UI. You pee when you can't get to the bathroom in time due to: A physical problem like a bladder injury or arthritis. A disease that affects the way your brain works, like Alzheimer's disease. Nocturnal UI. You pee while you sleep. What are the causes?     Medicines. Infections. Trouble pooping (constipation). Bladder muscles that squeeze too much or too soon (overactive bladder).  Weak bladder muscles. Weak pelvic floor muscles. These muscles provide support for the bladder, intestines, and the uterus. Enlarged prostate in males. The prostate sits around the tube that drains pee from the bladder (urethra). If the prostate gets big, it can pinch the urethra. If the urethra is blocked, the bladder gets weak and can't empty all the way. Surgery. Diseases that affect the nerves or spinal cord. What puts you at risk for UI? Age. The older you are, the higher the risk. Being overweight or having obesity. Being inactive and not getting much exercise. Pregnancy and childbirth. Menopause. Long-term coughing. Treatment for prostate cancer. How is UI diagnosed? A physical exam. Pee tests. X-rays of your kidneys or bladder. Ultrasound or CT scan. Cystoscopy. In this procedure, a provider inserts a tube with a light and camera (cystoscope) through the urethra and into the bladder to check for problems. Urodynamic testing. These tests check how well the bladder can store and release pee. Your provider may ask you to write down your symptoms. You may be asked to write when you pee and how much you pee. Where to find more information [employer] of Diabetes and Digestive and Kidney Diseases: Car[employer]li[employer]tn American Urology  Association: www.urologyhealth.org This information is not intended to replace advice given to you by your health care provider. Make sure you discuss any questions you have with your health care provider. Document Revised: 08/19/2023 Document Reviewed: 05/22/2023 Elsevier Patient Education  2024 Elsevier Inc.         Signed,   Reyes Pines, MD [facility], [facility] 06/02/24 10:03 AM

## 2024-06-15 NOTE — Progress Notes (Signed)
 Cardiology Office Note    Date:  06/18/2024  ID:  Elizabeth Lin, DOB Aug 04, 1946, MRN [id number] PCP:  Elizabeth Reyes [name], MD  Cardiologist:  Elizabeth Clay, MD  Electrophysiologist:  None   Chief Complaint: Follow up for dizziness   History of Present Illness: .    Elizabeth Lin is a 78 y.o. female with visit-pertinent history of right bundle branch block, mild aortic stenosis, hyperlipidemia, coronary calcium  score of 0, carpal tunnel disease and hypothyroidism.  CT cardiac scoring in 2019 indicated coronary calcium  score of 0, echo at that time indicated LVEF 60 to 65%, normal wall motion, mildly increased wall thickness, G1 DD, mild aortic stenosis.  Patient was last seen in clinic by Dr. Clay on 01/20/2022.  Patient reported some left-sided headache and dizziness that would come and go, denied any passing out.  Patient elected to recheck coronary calcium  score, this indicated a coronary calcium  score of 0.  Echocardiogram on 12/01/2022 indicated LVEF 60 to 65%, no RWMA, mild LVH, G1 DD, RV systolic function and size was normal, no evidence of mitral stenosis or regurgitation, mild aortic valve stenosis, mild dilation of the ascending aorta measuring 39 mm.  Patient was seen in clinic on 04/22/2024 regarding dizziness and lightheadedness.  On discussion patient reported 2 separate sensations, she noted that with position changes she become increasingly dizzy, typically resolving within a few seconds and with increasing her activity.  She also reported a sensation of lightheadedness or tingling sensation in the back of her head, she noted that when she was occupied or focused on doing something she was not aware of the sensation however when sitting and relaxing it persisted, noted to have been present for 6 to 8 months.  Patient also reported feeling tired and a lack of motivation however reported that she was still walking her dog multiple times a day and tolerating well.  She denied  any chest pain, noted a fleeting pinprick sensation different spots in her chest that can occur at random.  Denied any significant shortness of breath.  Patient's orthostatic vitals did indicate a decrease in blood pressure, patient noted that she would have increased lower extremity edema if she did not take her hydrochlorothiazide , patient was encouraged to increase her fluid intake, wear compression stockings and complete slow position changes.  Echocardiogram on 05/27/2024 indicated LVEF 60 to 65%, no RWMA, mild concentric LVH, G1 DD, RV systolic function and size was normal, normal pulmonary artery systolic pressures, no evidence of mitral valve regurgitation no evidence of mitral stenosis, mild calcification of the aortic valve without evidence of stenosis aortic valve regurgitation was mild.  Carotid Dopplers with near normal vessels with only minimal wall thickening or plaque.  Preliminary results of her cardiac monitor indicate an average heart rate of 62 bpm ranging from 47 to 103 bpm.  First-degree AV block was present, she had 1 run of supraventricular tachycardia lasting 5 beats with a max rate of 103 bpm.  Today she presents for follow-up.  She reports that she has been doing well overall, her orthostatic dizziness has significantly improved.  She continues to note a feeling of being slightly off at rest however when she starts to do things this resolves or if she is unable to notice it, question if this is related to her history of Mnire's disease.  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  Also with history of venous insufficiency.  She denies any significant palpitations, presyncope or syncope.  Reviewed patient's cardiac monitor, echocardiogram and carotid Doppler results, all questions answered. ROS: .   Today she denies chest pain, shortness of breath, lower extremity  edema, fatigue, palpitations, melena, hematuria, hemoptysis, diaphoresis, weakness, presyncope, syncope, orthopnea, and PND.  All other systems are reviewed and otherwise negative. Studies Reviewed: Elizabeth Lin   EKG:  EKG is not ordered today.  CV Studies: Cardiac studies reviewed are outlined and summarized above. Otherwise please see EMR for full report. Cardiac Studies & Procedures   ______________________________________________________________________________________________     ECHOCARDIOGRAM  ECHOCARDIOGRAM COMPLETE 05/27/2024  Narrative ECHOCARDIOGRAM REPORT    Patient Name:   Elizabeth Lin Date of Exam: 05/27/2024 Medical Rec #:  [id number]         Height:       65.5 in Accession #:    [id number]        Weight:       204.6 lb Date of Birth:  07/12/1946        BSA:          2.009 m Patient Age:    77 years          BP:           133/70 mmHg Patient Gender: F                 HR:           54 bpm. Exam Location:  [facility]  Procedure: 2D Echo, Color Doppler and Cardiac Doppler (Both Spectral and Color Flow Doppler were utilized during procedure).  Indications:    Lower extremity edema [R60.0 (ICD-10-CM)]  History:        Patient has prior history of Echocardiogram examinations, most recent 12/01/2022. Arrythmias:RBBB; Risk Factors:Hypertension, Dyslipidemia and Former Smoker.  Sonographer:    Elizabeth Lin MHA, RDMS, RVT, RDCS Referring Phys: [id number] Elizabeth Lin   Sonographer Comments: Image acquisition challenging due to patient body habitus. IMPRESSIONS   1. Left ventricular ejection fraction, by estimation, is 60 to 65%. The left ventricle has normal function. The left ventricle has no regional wall motion abnormalities. There is mild concentric left ventricular hypertrophy. Left ventricular diastolic parameters are consistent with Grade I diastolic dysfunction (impaired relaxation). 2. Right ventricular systolic function is normal. The right ventricular  size is normal. There is normal pulmonary artery systolic pressure. 3. The mitral valve is normal in structure. No evidence of mitral valve regurgitation. No evidence of mitral stenosis. 4. The aortic valve is tricuspid. There is mild calcification of the aortic valve. There is mild thickening of the aortic valve. Aortic valve regurgitation is mild. Aortic valve sclerosis/calcification is present, without any evidence of aortic stenosis. Aortic valve area, by VTI measures 1.40 cm. Aortic valve mean gradient measures 9.0 mmHg. Aortic valve Vmax measures 2.05 m/s. 5. The inferior vena cava is normal in size with greater than 50% respiratory variability, suggesting right atrial pressure of 3 mmHg.  FINDINGS Left Ventricle: Left ventricular ejection fraction, by estimation, is 60 to 65%. The left ventricle has normal function. The left ventricle has no regional wall motion abnormalities. The left ventricular internal cavity size was normal in size. There is mild concentric left ventricular hypertrophy. Left ventricular diastolic parameters are consistent with Grade I diastolic dysfunction (impaired relaxation). Normal left ventricular filling pressure.  Right Ventricle: The right ventricular size is normal. No increase in right ventricular wall thickness. Right ventricular systolic  function is normal. There is normal pulmonary artery systolic pressure. The tricuspid regurgitant velocity is 0.90 m/s, and with an assumed right atrial pressure of 3 mmHg, the estimated right ventricular systolic pressure is 6.2 mmHg.  Left Atrium: Left atrial size was normal in size.  Right Atrium: Right atrial size was normal in size.  Pericardium: There is no evidence of pericardial effusion.  Mitral Valve: The mitral valve is normal in structure. No evidence of mitral valve regurgitation. No evidence of mitral valve stenosis.  Tricuspid Valve: The tricuspid valve is normal in structure. Tricuspid valve regurgitation  is trivial. No evidence of tricuspid stenosis.  Aortic Valve: The aortic valve is tricuspid. There is mild calcification of the aortic valve. There is mild thickening of the aortic valve. Aortic valve regurgitation is mild. Aortic valve sclerosis/calcification is present, without any evidence of aortic stenosis. Aortic valve mean gradient measures 9.0 mmHg. Aortic valve peak gradient measures 16.9 mmHg. Aortic valve area, by VTI measures 1.40 cm.  Pulmonic Valve: The pulmonic valve was normal in structure. Pulmonic valve regurgitation is not visualized. No evidence of pulmonic stenosis.  Aorta: The aortic root is normal in size and structure.  Venous: The inferior vena cava is normal in size with greater than 50% respiratory variability, suggesting right atrial pressure of 3 mmHg.  IAS/Shunts: No atrial level shunt detected by color flow Doppler.   LEFT VENTRICLE PLAX 2D LVIDd:         4.13 cm   Diastology LVIDs:         2.43 cm   LV e' medial:    7.94 cm/s LV PW:         1.35 cm   LV E/e' medial:  7.7 LV IVS:        1.10 cm   LV e' lateral:   8.92 cm/s LVOT diam:     1.78 cm   LV E/e' lateral: 6.8 LV SV:         71 LV SV Index:   35 LVOT Area:     2.49 cm   RIGHT VENTRICLE          IVC RV Basal diam:  3.01 cm  IVC diam: 1.51 cm RV Mid diam:    2.22 cm  LEFT ATRIUM             Index        RIGHT ATRIUM           Index LA diam:        3.95 cm 1.97 cm/m   RA Area:     12.50 cm LA Vol (A2C):   54.1 ml 26.93 ml/m  RA Volume:   25.30 ml  12.60 ml/m LA Vol (A4C):   47.9 ml 23.85 ml/m LA Biplane Vol: 51.2 ml 25.49 ml/m AORTIC VALVE AV Area (Vmax):    1.36 cm AV Area (Vmean):   1.51 cm AV Area (VTI):     1.40 cm AV Vmax:           205.50 cm/s AV Vmean:          124.750 cm/s AV VTI:            0.508 m AV Peak Grad:      16.9 mmHg AV Mean Grad:      9.0 mmHg LVOT Vmax:         112.00 cm/s LVOT Vmean:        75.500 cm/s LVOT VTI:  0.285 m LVOT/AV VTI ratio:  0.56  AORTA Ao Root diam: 3.30 cm Ao Asc diam:  3.26 cm  MITRAL VALVE               TRICUSPID VALVE MV Area (PHT): 2.91 cm    TR Peak grad:   3.2 mmHg MV Decel Time: 261 msec    TR Vmax:        90.00 cm/s MV E velocity: 60.80 cm/s MV A velocity: 86.50 cm/s  SHUNTS MV E/A ratio:  0.70        Systemic VTI:  0.29 m Systemic Diam: 1.78 cm  Elizabeth Scarce MD Electronically signed by Elizabeth Scarce MD Signature Date/Time: 05/27/2024/4:34:32 PM    Final      CT SCANS  CT CARDIAC SCORING (SELF PAY ONLY) 01/28/2022  Addendum 01/28/2022  4:36 PM ADDENDUM REPORT: 01/28/2022 16:34  ADDENDUM: Cardiovascular Disease Risk stratification  EXAM: Coronary Calcium  Score  TECHNIQUE: A gated, non-contrast computed tomography scan of the heart was performed using 3mm slice thickness. Axial images were analyzed on a dedicated workstation. Calcium  scoring of the coronary arteries was performed using the Agatston method.  FINDINGS: Coronary arteries: Normal origins.  Coronary Calcium  Score:  Left main: 0  Left anterior descending artery: 0  Left circumflex artery: 0  Right coronary artery: 0  Total: 0  Percentile: 0  Pericardium: Normal.  Ascending Aorta: Normal caliber.  Non-cardiac: See separate report from [facility].  IMPRESSION: Coronary calcium  score of 0. This was 0 percentile for age-, race-, and sex-matched controls.  RECOMMENDATIONS: Coronary artery calcium  (CAC) score is a strong predictor of incident coronary heart disease (CHD) and provides predictive information beyond traditional risk factors. CAC scoring is reasonable to use in the decision to withhold, postpone, or initiate statin therapy in intermediate-risk or selected borderline-risk asymptomatic adults (age 68-75 years and LDL-C >=70 to <190 mg/dL) who do not have diabetes or established atherosclerotic cardiovascular disease (ASCVD).* In intermediate-risk (10-year ASCVD risk >=7.5%  to <20%) adults or selected borderline-risk (10-year ASCVD risk >=5% to <7.5%) adults in whom a CAC score is measured for the purpose of making a treatment decision the following recommendations have been made:  If CAC=0, it is reasonable to withhold statin therapy and reassess in 5 to 10 years, as long as higher risk conditions are absent (diabetes mellitus, family history of premature CHD in first degree relatives (males <55 years; females <65 years), cigarette smoking, or LDL >=190 mg/dL).  If CAC is 1 to 99, it is reasonable to initiate statin therapy for patients >=33 years of age.  If CAC is >=100 or >=75th percentile, it is reasonable to initiate statin therapy at any age.  Cardiology referral should be considered for patients with CAC scores >=400 or >=75th percentile.  *2018 AHA/ACC/AACVPR/AAPA/ABC/ACPM/ADA/AGS/APhA/ASPC/NLA/PCNA Guideline on the Management of Blood Cholesterol: A Report of the American College of Cardiology/American Heart Association Task Force on Clinical Practice Guidelines. J Am Coll Cardiol. 2019;73(24):3168-3209.  Kardie Tobb, DO [facility]  The noncardiac portion of this study will be interpreted in separate report by the radiologist.   Electronically Signed By: Kardie  Tobb Elizabeth.O. On: 01/28/2022 16:34  Narrative CLINICAL DATA:  This over-read does not include interpretation of cardiac or coronary anatomy or pathology. The coronary calcium  score interpretation by the cardiologist is attached.  COMPARISON:  Coronary calcium  score dated November 18, 2017.  FINDINGS: Cardiovascular: There are no significant vascular findings. There is no pericardial effusion.  Mediastinum/Nodes: There are no enlarged lymph nodes.The  visualized esophagus demonstrates no significant findings.  Lungs/Pleura: Unchanged minimal scarring in the right middle lobe. No consolidation, pleural effusion, or pneumothorax. No suspicious pulmonary nodule.  Upper abdomen:  No acute abnormality.  Musculoskeletal/Chest wall: No chest wall abnormality. No acute or significant osseous findings.  IMPRESSION: 1. No significant non-cardiac findings.  Electronically Signed: By: Elsie ONEIDA Shoulder M.Elizabeth. On: 01/28/2022 11:27   CT SCANS  CT CARDIAC SCORING (SELF PAY ONLY) 11/18/2017  Addendum 11/18/2017  3:43 PM ADDENDUM REPORT: 11/18/2017 15:41  CLINICAL DATA:  Risk stratification  EXAM: Coronary Calcium  Score  TECHNIQUE: The patient was scanned on a [employer]. Axial non-contrast 3 mm slices were carried out through the heart. The data set was analyzed on a dedicated work station and scored using the Agatson method.  FINDINGS: Non-cardiac: See separate report from [facility].  Ascending Aorta: Normal size, trivial calcifications in the aortic root.  Pericardium: Normal.  Coronary arteries: Normal origin.  IMPRESSION: Coronary calcium  score of 0. This was 0 percentile for age and sex matched control.   Electronically Signed By: Leim Moose On: 11/18/2017 15:41  Narrative EXAM: OVER-READ INTERPRETATION  CT CHEST  The following report is an over-read performed by radiologist Dr. Toribio Aye of [facility] on 11/18/2017. This over-read does not include interpretation of cardiac or coronary anatomy or pathology. The coronary calcium  score interpretation by the cardiologist is attached.  COMPARISON:  None.  FINDINGS: Within the visualized portions of the thorax there are no suspicious appearing pulmonary nodules or masses, there is no acute consolidative airspace disease, no pleural effusions, no pneumothorax and no lymphadenopathy. Visualized portions of the upper abdomen are unremarkable. There are no aggressive appearing lytic or blastic lesions noted in the visualized portions of the skeleton.  IMPRESSION: 1. No significant incidental noncardiac findings are noted.  Electronically  Signed: By: Toribio Aye M.Elizabeth. On: 11/18/2017 14:45     ______________________________________________________________________________________________       Current Reported Medications:.    Current Meds  Medication Sig   acetaminophen  (TYLENOL ) 500 MG tablet Take by mouth.   ascorbic acid (VITAMIN C) 1000 MG tablet Take by mouth.   atorvastatin  (LIPITOR) 10 MG tablet 1 Tablet by mouth FOUR times a week   Calcium -Vitamin Elizabeth -Vitamin K (CALCIUM  + Elizabeth + K PO) Take 1 tablet by mouth 2 (two) times daily. 1300 mg+D1000 IU   ciclopirox  (PENLAC ) 8 % solution Apply topically at bedtime. Apply over nail. Apply daily over previous coat. Remove weekly with polish remover.   DULoxetine  (CYMBALTA ) 20 MG capsule Take 2 capsules (40 mg total) by mouth daily.   fish oil-omega-3 fatty acids 1000 MG capsule Take 1 g by mouth 2 (two) times daily.    hydrochlorothiazide  (HYDRODIURIL ) 12.5 MG tablet Take 1 tablet (12.5 mg total) by mouth daily.   ibuprofen (ADVIL) 200 MG tablet Take by mouth.   ketoconazole  (NIZORAL ) 2 % shampoo APPLY 1 APPLICATION TOPICALLY ONCE A WEEK.   levothyroxine  (SYNTHROID ) 150 MCG tablet Take 1 tablet (150 mcg total) by mouth daily.   meclizine  (ANTIVERT ) 25 MG tablet Take 1 tablet (25 mg total) by mouth 3 (three) times daily as needed for dizziness.   naproxen  sodium (ALEVE ) 220 MG tablet Take 220-440 mg by mouth 2 (two) times daily as needed (pain/soreness).   ofloxacin (OCUFLOX) 0.3 % ophthalmic solution Apply to eye.   Polyethyl Glycol-Propyl Glycol (LUBRICANT EYE DROPS) 0.4-0.3 % SOLN Place 1 drop into both eyes 3 (three) times daily as needed (dry/irritated  eyes.). Rohto Ice Multi-Symptom Relief   Vitamin E (VITAMIN E/Elizabeth-ALPHA NATURAL) 268 MG (400 UNIT) CAPS Take by mouth.    Physical Exam:    VS:  BP 126/78   Pulse (!) 59   Ht 5' 5.5 (1.664 m)   Wt 202 lb (91.6 kg)   SpO2 98%   BMI 33.10 kg/m    Wt Readings from Last 3 Encounters:  06/17/24 202 lb (91.6 kg)   06/02/24 203 lb 9.6 oz (92.4 kg)  06/01/24 203 lb 9.6 oz (92.4 kg)    GEN: Well nourished, well developed in no acute distress NECK: No JVD; No carotid bruits CARDIAC: RRR, no murmurs, rubs, gallops RESPIRATORY:  Clear to auscultation without rales, wheezing or rhonchi  ABDOMEN: Soft, non-tender, non-distended EXTREMITIES:  No edema; No acute deformity     Asessement and Plan:.    Dizziness/lightheadedness: At last office visit patient reported increased dizziness and lightheadedness, her hydrochlorothiazide  had been decreased with improvement in symptoms.  Echocardiogram, carotid Dopplers and cardiac monitor all reassuring as noted above.  Patient reports that her dizziness with position changes has significantly improved, notes an occasional sensation of being off balance when sitting, when she starts doing activity she is unable to do feel the sensation.  Question if this is related to her history of Mnire's disease.  Patient reassured given testing.  HTN: Blood pressure today 126/78.  Her previously reported dizziness has significantly improved, continue current antihypertensive regimen.  Patient request assistance regarding weight loss, will refer to healthy weight and wellness.  Lower extremity edema: Patient notes that if she is not taking any hydrochlorothiazide  she does have increases in swelling, she is now trying compression stockings with some improvement.  Also recommended leg elevation and decrease salt intake.  RBBB: Stable on last EKG, chronic.  Patient's cardiac monitor reassuring as noted above.  Aortic stenosis: Echocardiogram on 05/27/2024 indicated LVEF 60 to 65%, no RWMA, mild concentric LVH, G1 DD, RV systolic function and size was normal, normal pulmonary artery systolic pressures, no evidence of mitral valve regurgitation no evidence of mitral stenosis, mild calcification of the aortic valve without evidence of stenosis aortic valve regurgitation was mild.  She  denies any chest pain or increased shortness of breath.   Disposition: F/u with Dr. Anner in six months or sooner if needed.   Signed, Henson Fraticelli Elizabeth Nhat Hearne, NP

## 2024-06-17 ENCOUNTER — Ambulatory Visit: Attending: Cardiology | Admitting: Cardiology

## 2024-06-17 ENCOUNTER — Encounter: Payer: Self-pay | Admitting: Cardiology

## 2024-06-17 VITALS — BP 126/78 | HR 59 | Ht 65.5 in | Wt 202.0 lb

## 2024-06-17 DIAGNOSIS — I4510 Unspecified right bundle-branch block: Secondary | ICD-10-CM

## 2024-06-17 DIAGNOSIS — R600 Localized edema: Secondary | ICD-10-CM

## 2024-06-17 DIAGNOSIS — R42 Dizziness and giddiness: Secondary | ICD-10-CM | POA: Diagnosis not present

## 2024-06-17 DIAGNOSIS — Z7689 Persons encountering health services in other specified circumstances: Secondary | ICD-10-CM | POA: Diagnosis not present

## 2024-06-17 DIAGNOSIS — I350 Nonrheumatic aortic (valve) stenosis: Secondary | ICD-10-CM

## 2024-06-17 DIAGNOSIS — I10 Essential (primary) hypertension: Secondary | ICD-10-CM

## 2024-06-17 NOTE — Patient Instructions (Signed)
 Medication Instructions:   Your physician recommends that you continue on your current medications as directed. Please refer to the Current Medication list given to you today.   *If you need a refill on your cardiac medications before your next appointment, please call your pharmacy*   Lab Work: NONE ORDERED  TODAY     If you have labs (blood work) drawn today and your tests are completely normal, you will receive your results only by: MyChart Message (if you have MyChart) OR A paper copy in the mail If you have any lab test that is abnormal or we need to change your treatment, we will call you to review the results.    Testing/Procedures: NONE ORDERED  TODAY      Follow-Up: At [facility], you and your health needs are our priority.  As part of our continuing mission to provide you with exceptional heart care, our providers are all part of one team.  This team includes your primary Cardiologist (physician) and Advanced Practice Providers or APPs (Physician Assistants and Nurse Practitioners) who all work together to provide you with the care you need, when you need it.    Your next appointment:  You have been referred to Weight management  clinic( someone will contact you from that department with appointment information)   6 month(s)  Provider:    Alm Clay, MD    We recommend signing up for the patient portal called MyChart.  Sign up information is provided on this After Visit Summary.  MyChart is used to connect with patients for Virtual Visits (Telemedicine).  Patients are able to view lab/test results, encounter notes, upcoming appointments, etc.  Non-urgent messages can be sent to your provider as well.   To learn more about what you can do with MyChart, go to [email or web address].   Other Instructions

## 2024-06-18 ENCOUNTER — Encounter: Payer: Self-pay | Admitting: Cardiology

## 2024-07-05 ENCOUNTER — Encounter

## 2024-07-06 ENCOUNTER — Encounter (INDEPENDENT_AMBULATORY_CARE_PROVIDER_SITE_OTHER): Payer: Self-pay

## 2024-07-14 DIAGNOSIS — F330 Major depressive disorder, recurrent, mild: Secondary | ICD-10-CM | POA: Diagnosis not present

## 2024-07-14 DIAGNOSIS — F411 Generalized anxiety disorder: Secondary | ICD-10-CM | POA: Diagnosis not present

## 2024-07-20 ENCOUNTER — Ambulatory Visit: Admitting: Podiatry

## 2024-07-20 DIAGNOSIS — Z961 Presence of intraocular lens: Secondary | ICD-10-CM | POA: Diagnosis not present

## 2024-07-20 DIAGNOSIS — H26493 Other secondary cataract, bilateral: Secondary | ICD-10-CM | POA: Diagnosis not present

## 2024-07-21 DIAGNOSIS — F331 Major depressive disorder, recurrent, moderate: Secondary | ICD-10-CM | POA: Diagnosis not present

## 2024-07-21 DIAGNOSIS — F411 Generalized anxiety disorder: Secondary | ICD-10-CM | POA: Diagnosis not present

## 2024-07-22 ENCOUNTER — Ambulatory Visit: Admitting: Podiatry

## 2024-07-22 DIAGNOSIS — M778 Other enthesopathies, not elsewhere classified: Secondary | ICD-10-CM | POA: Diagnosis not present

## 2024-07-22 DIAGNOSIS — M19071 Primary osteoarthritis, right ankle and foot: Secondary | ICD-10-CM

## 2024-07-22 NOTE — Progress Notes (Signed)
 Subjective:  Patient ID: Elizabeth Lin, female    DOB: Dec 01, 1945,  MRN: [id number]  Chief Complaint  Patient presents with   Arthritis    Pt stated that she would like an injection in her right foot for her arthritis     78 y.o. female presents with the above complaint.  Patient presents with right dorsal midfoot pain painful to touch is progressive gotten worse worse with ambulation worse with pressure she would like to discuss treatment options for this.  She does have arthritis in her feet.  She has not seen it was prior to seeing me for this.  Pain scale is 5 out of 10 dull aching nature.   Review of Systems: Negative except as noted in the HPI. Denies N/V/F/Ch.  Past Medical History:  Diagnosis Date   Allergy    Anxiety    Arthritis    Carpal tunnel syndrome    right hand, getting injections   Cataract    Depression    Dysrhythmia    BBB   Hammer toe    Hypothyroidism due to Hashimoto's thyroiditis    Meniere disease, right     Current Outpatient Medications:    acetaminophen  (TYLENOL ) 500 MG tablet, Take by mouth., Disp: , Rfl:    ascorbic acid (VITAMIN C) 1000 MG tablet, Take by mouth., Disp: , Rfl:    atorvastatin  (LIPITOR) 10 MG tablet, 1 Tablet by mouth FOUR times a week, Disp: 36 tablet, Rfl: 2   Calcium -Vitamin D -Vitamin K (CALCIUM  + D + K PO), Take 1 tablet by mouth 2 (two) times daily. 1300 mg+D1000 IU, Disp: , Rfl:    ciclopirox  (PENLAC ) 8 % solution, Apply topically at bedtime. Apply over nail. Apply daily over previous coat. Remove weekly with polish remover., Disp: 6.6 mL, Rfl: 11   DULoxetine  (CYMBALTA ) 20 MG capsule, Take 2 capsules (40 mg total) by mouth daily., Disp: 180 capsule, Rfl: 1   fish oil-omega-3 fatty acids 1000 MG capsule, Take 1 g by mouth 2 (two) times daily. , Disp: , Rfl:    hydrochlorothiazide  (HYDRODIURIL ) 12.5 MG tablet, Take 1 tablet (12.5 mg total) by mouth daily., Disp: 90 tablet, Rfl: 1   ibuprofen (ADVIL) 200 MG tablet, Take  by mouth., Disp: , Rfl:    ketoconazole  (NIZORAL ) 2 % shampoo, APPLY 1 APPLICATION TOPICALLY ONCE A WEEK., Disp: 120 mL, Rfl: 0   levothyroxine  (SYNTHROID ) 150 MCG tablet, Take 1 tablet (150 mcg total) by mouth daily., Disp: 90 tablet, Rfl: 3   meclizine  (ANTIVERT ) 25 MG tablet, Take 1 tablet (25 mg total) by mouth 3 (three) times daily as needed for dizziness., Disp: 30 tablet, Rfl: 0   naproxen  sodium (ALEVE ) 220 MG tablet, Take 220-440 mg by mouth 2 (two) times daily as needed (pain/soreness)., Disp: , Rfl:    ofloxacin (OCUFLOX) 0.3 % ophthalmic solution, Apply to eye., Disp: , Rfl:    Polyethyl Glycol-Propyl Glycol (LUBRICANT EYE DROPS) 0.4-0.3 % SOLN, Place 1 drop into both eyes 3 (three) times daily as needed (dry/irritated eyes.). Rohto Ice Multi-Symptom Relief, Disp: , Rfl:    Vitamin E (VITAMIN E/D-ALPHA NATURAL) 268 MG (400 UNIT) CAPS, Take by mouth., Disp: , Rfl:   Social History   Tobacco Use  Smoking Status Former   Current packs/day: 0.00   Types: Cigarettes   Quit date: 11/01/1974   Years since quitting: 49.7  Smokeless Tobacco Never    Allergies  Allergen Reactions   Penicillins Rash    Did it  involve swelling of the face/tongue/throat, SOB, or low BP? No Did it involve sudden or severe rash/hives, skin peeling, or any reaction on the inside of your mouth or nose? Yes Did you need to seek medical attention at a hospital or doctor's office? No When did it last happen?  ~6-10 years ago     If all above answers are NO, may proceed with cephalosporin use.    Amoxicillin-Pot Clavulanate     Red rash Did it involve swelling of the face/tongue/throat, SOB, or low BP? No Did it involve sudden or severe rash/hives, skin peeling, or any reaction on the inside of your mouth or nose? Yes Did you need to seek medical attention at a hospital or doctor's office? No When did it last happen? ~6-10 years ago    If all above answers are "NO", may proceed with cephalosporin use.     Sulfa Antibiotics    Objective:  There were no vitals filed for this visit. There is no height or weight on file to calculate BMI. Constitutional Well developed. Well nourished.  Vascular Dorsalis pedis pulses palpable bilaterally. Posterior tibial pulses palpable bilaterally. Capillary refill normal to all digits.  No cyanosis or clubbing noted. Pedal hair growth normal.  Neurologic Normal speech. Oriented to person, place, and time. Epicritic sensation to light touch grossly present bilaterally.  Dermatologic Nails well groomed and normal in appearance. No open wounds. No skin lesions.  Orthopedic: Pain on palpation of right dorsal midfoot.  Pain at approximately third tarsometatarsal joint.  Negative extensor flexor tendinitis noted.  Lisfranc interval is intact.   Radiographs: None Assessment:   1. Arthritis of right midfoot   2. Capsulitis of right foot     Plan:  Patient was evaluated and treated and all questions answered.  Right midfoot arthritis with underlying capsulitis -All questions and concerns were discussed with the patient extensive detail given the amount of pain that she is having she will benefit from steroid injection to help decrease the confirmatory component associate with pain.  Patient agrees with plan like to proceed with steroid injection -A steroid injection was performed at right midfoot using 1% plain Lidocaine  and 10 mg of Kenalog. This was well tolerated. -Shoe gear modification was discussed   No follow-ups on file.

## 2024-08-04 DIAGNOSIS — F411 Generalized anxiety disorder: Secondary | ICD-10-CM | POA: Diagnosis not present

## 2024-08-04 DIAGNOSIS — F331 Major depressive disorder, recurrent, moderate: Secondary | ICD-10-CM | POA: Diagnosis not present

## 2024-08-11 DIAGNOSIS — F411 Generalized anxiety disorder: Secondary | ICD-10-CM | POA: Diagnosis not present

## 2024-08-11 DIAGNOSIS — F331 Major depressive disorder, recurrent, moderate: Secondary | ICD-10-CM | POA: Diagnosis not present

## 2024-08-16 DIAGNOSIS — E669 Obesity, unspecified: Secondary | ICD-10-CM | POA: Diagnosis not present

## 2024-08-16 DIAGNOSIS — F325 Major depressive disorder, single episode, in full remission: Secondary | ICD-10-CM | POA: Diagnosis not present

## 2024-08-16 DIAGNOSIS — F411 Generalized anxiety disorder: Secondary | ICD-10-CM | POA: Diagnosis not present

## 2024-08-16 DIAGNOSIS — E039 Hypothyroidism, unspecified: Secondary | ICD-10-CM | POA: Diagnosis not present

## 2024-08-16 DIAGNOSIS — N182 Chronic kidney disease, stage 2 (mild): Secondary | ICD-10-CM | POA: Diagnosis not present

## 2024-08-16 DIAGNOSIS — E785 Hyperlipidemia, unspecified: Secondary | ICD-10-CM | POA: Diagnosis not present

## 2024-08-16 DIAGNOSIS — Z8249 Family history of ischemic heart disease and other diseases of the circulatory system: Secondary | ICD-10-CM | POA: Diagnosis not present

## 2024-08-16 DIAGNOSIS — I951 Orthostatic hypotension: Secondary | ICD-10-CM | POA: Diagnosis not present

## 2024-08-16 DIAGNOSIS — Z833 Family history of diabetes mellitus: Secondary | ICD-10-CM | POA: Diagnosis not present

## 2024-08-18 DIAGNOSIS — F331 Major depressive disorder, recurrent, moderate: Secondary | ICD-10-CM | POA: Diagnosis not present

## 2024-08-18 DIAGNOSIS — F411 Generalized anxiety disorder: Secondary | ICD-10-CM | POA: Diagnosis not present

## 2024-08-22 ENCOUNTER — Ambulatory Visit: Admitting: Family Medicine

## 2024-08-22 ENCOUNTER — Encounter: Payer: Self-pay | Admitting: Family Medicine

## 2024-08-22 VITALS — BP 112/74 | HR 61 | Temp 97.4°F | Ht 65.5 in | Wt 201.6 lb

## 2024-08-22 DIAGNOSIS — R5383 Other fatigue: Secondary | ICD-10-CM | POA: Diagnosis not present

## 2024-08-22 DIAGNOSIS — R195 Other fecal abnormalities: Secondary | ICD-10-CM | POA: Diagnosis not present

## 2024-08-22 DIAGNOSIS — I10 Essential (primary) hypertension: Secondary | ICD-10-CM | POA: Diagnosis not present

## 2024-08-22 LAB — BASIC METABOLIC PANEL WITH GFR
BUN: 16 mg/dL (ref 6–23)
CO2: 31 meq/L (ref 19–32)
Calcium: 9.6 mg/dL (ref 8.4–10.5)
Chloride: 98 meq/L (ref 96–112)
Creatinine, Ser: 0.73 mg/dL (ref 0.40–1.20)
GFR: 78.49 mL/min (ref >60.00)
Glucose, Bld: 102 mg/dL — ABNORMAL HIGH (ref 70–99)
Potassium: 3.9 meq/L (ref 3.5–5.1)
Sodium: 138 meq/L (ref 135–145)

## 2024-08-22 LAB — CBC
HCT: 41.8 % (ref 36.0–46.0)
Hemoglobin: 13.9 g/dL (ref 12.0–15.0)
MCHC: 33.2 g/dL (ref 30.0–36.0)
MCV: 85.8 fl (ref 78.0–100.0)
Platelets: 314 K/uL (ref 150.0–400.0)
RBC: 4.88 Mil/uL (ref 3.87–5.11)
RDW: 15 % (ref 11.5–15.5)
WBC: 5.7 K/uL (ref 4.0–10.5)

## 2024-08-22 LAB — TSH: TSH: 2.26 u[IU]/mL (ref 0.35–5.50)

## 2024-08-22 NOTE — Patient Instructions (Addendum)
 For fatigue I will check some blood work today but I expect that to be stable from your readings earlier in the year.  Blood pressure does not drop as much here in the office, but I think we should continue to keep an eye on that with home readings.  Bring this to your next visit.  If you have any low blood pressure readings, especially after standing up or feeling lightheaded or dizzy, let me know right away.  Make sure you do not skip meals.  Continue to stay hydrated during the day.  A small dose of a fiber supplement like Citrucel may be helpful but if that worsens diarrhea or leakage, stop it right away.  I did place another referral to gastroenterology.  If you do not hear from them in the next week or 2, let me know.  Continue talking with your therapist as some of the stress can also affect fatigue.  I would like to follow-up with you in 1 month and see how things are going but I am happy to see you sooner if needed.  Hang in there!  Return to the clinic or go to the nearest emergency room if any of your symptoms worsen or new symptoms occur.

## 2024-08-22 NOTE — Progress Notes (Signed)
 Subjective:  Patient ID: Elizabeth Lin, female    DOB: [birth year]  Age: 78 y.o. MRN: [id number]  CC:  Chief Complaint  Patient presents with   Medication Refill    Patient had a nurse come to her house for insurance. BP was great but wants to discuss diuretic.    Diarrhea    Unable to make it to the bathroom. She has not gone to gastro. She has not heard from them.     HPI Elizabeth Lin presents for   Concerns above.   Hypertension, fatigue.  Treated with HCTZ 12.5 mg daily for HTN and meniere's.  She was referred to urology for mixed incontinence at her September visit.  Some urinary frequency discussed at that time but no dysuria or hematuria.  Kegel exercises discussed. Had a NP come to her house for insurance, mentioned intermittent dosing of hydrochlorothiazide  as needed.  Home readings: none.  Some fatigue - past year.  Some stress with dtr, talking with psychologist. On cymbalta  - no benefit with hifgher dose in past. No SI.  Skips meals at times. Brunch, dinner - skips 1 meal then snacking.  Staying hydrated.  Additional history later in visit - notes from home visit - possible orthostasis.  BP sitting 111/62, standing 88/57.   No home readings - was advised to do so.    BP Readings from Last 3 Encounters:  08/22/24 112/74  06/17/24 126/78  06/02/24 126/70   Lab Results  Component Value Date   CREATININE 0.77 04/08/2024      06/01/2024    3:55 PM 08/20/2023   12:25 PM 07/24/2023    9:32 AM 03/26/2023   11:45 AM 01/15/2023    3:25 PM  Depression screen PHQ 2/9  Decreased Interest 0 2 0 0 0  Down, Depressed, Hopeless 2 1 3  0 1  PHQ - 2 Score 2 3 3  0 1  Altered sleeping 2 3 3  0 1  Tired, decreased energy 2 3 2  0 2  Change in appetite 2 3 3  0 3  Feeling bad or failure about yourself  0 2 0 0 0  Trouble concentrating 2 1 0 0 1  Moving slowly or fidgety/restless 0 0 0 0 0  Suicidal thoughts 0 0 0 0 0  PHQ-9 Score 10  15  11   0  8   Difficult doing  work/chores Somewhat difficult  Somewhat difficult Not difficult at all Not difficult at all     Data saved with a previous flowsheet row definition   Lab Results  Component Value Date   WBC 6.1 04/08/2024   HGB 13.9 04/08/2024   HCT 42.0 04/08/2024   MCV 85.6 04/08/2024   PLT 305.0 04/08/2024   Lab Results  Component Value Date   TSH 1.58 04/08/2024   Diarrhea: BM's discussed in September. Some anal leakage at times. No abd pain, fever, no melena/hematochezia. Fecal urgency at times. No hard stools, some normal stools, then small amt liquid at times. Referred to GI last visit in September.  Has not seen - unknown if they called.  No fiber supplements, no stool softeners.  No sugar substitutes or sugar free candy.      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/09/2022   Excessive cerumen in ear  canal, right 07/17/2020   Neck swelling 07/17/2020   Impacted cerumen of right ear 07/17/2020   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/08/2019   Sensorineural hearing loss (SNHL), bilateral 04/27/2019   Meniere disease, left 12/12/2017   Mild aortic stenosis 11/10/2017   Bilateral edema of lower extremity 11/10/2017   Family history of premature CAD 11/10/2017   Arthralgia of multiple joints 10/23/2017   Class 1 obesity due to excess calories without serious comorbidity with body mass index (BMI) of 32.0 to 32.9 in adult 10/23/2017   Essential hypertension 10/23/2017   Right bundle branch block (RBBB) 10/23/2017   Pure hypercholesterolemia 10/23/2017   Vitamin D  deficiency 09/06/2014   Right knee pain 02/24/2013   Hypothyroid 11/02/2011   Depression 11/02/2011   History of positive PPD 11/02/2011   Past Medical History:  Diagnosis Date   Allergy    Anxiety    Arthritis    Carpal tunnel  syndrome    right hand, getting injections   Cataract    Depression    Dysrhythmia    BBB   Hammer toe    Hypothyroidism due to Hashimoto's thyroiditis    Meniere disease, right    Past Surgical History:  Procedure Laterality Date   APPENDECTOMY     COLONOSCOPY     CYSTECTOMY     eye   LAPAROSCOPIC ASSISTED SPIGELIAN HERNIA REPAIR N/A 11/08/2019   Procedure: LAPAROSCOPIC ASSISTED SPIGELIAN HERNIA REPAIR WITH MESH;  Surgeon: Belinda Cough, MD;  Location: [facility];  Service: General;  Laterality: N/A;   OOPHORECTOMY Right    TONSILLECTOMY     TRANSTHORACIC ECHOCARDIOGRAM  12/09/2019   EF 60 to 65%.  No R WMA.  GR 1 DD.  Normal RV function.  Aortic calcification with sclerosis but no suggestion of stenosis.  (Mean gradient 8 mmHg)   Allergies  Allergen Reactions   Penicillins Rash    Did it involve swelling of the face/tongue/throat, SOB, or low BP? No Did it involve sudden or severe rash/hives, skin peeling, or any reaction on the inside of your mouth or nose? Yes Did you need to seek medical attention at a hospital or doctor's office? No When did it last happen?  ~6-10 years ago     If all above answers are NO, may proceed with cephalosporin use.    Amoxicillin-Pot Clavulanate     Red rash Did it involve swelling of the face/tongue/throat, SOB, or low BP? No Did it involve sudden or severe rash/hives, skin peeling, or any reaction on the inside of your mouth or nose? Yes Did you need to seek medical attention at a hospital or doctor's office? No When did it last happen? ~6-10 years ago    If all above answers are "NO", may proceed with cephalosporin use.    Sulfa Antibiotics    Prior to Admission medications   Medication Sig Start Date End Date Taking? Authorizing Provider  acetaminophen  (TYLENOL ) 500 MG tablet Take by mouth.    [provider]  ascorbic acid (VITAMIN C) 1000 MG tablet Take by mouth.    [provider]  atorvastatin  (LIPITOR) 10 MG  tablet 1 Tablet by mouth FOUR times a week 06/01/24   Levora Reyes SAUNDERS, MD  Calcium -Vitamin D -Vitamin K (CALCIUM  + D + K PO) Take 1 tablet by mouth 2 (two) times daily. 1300 mg+D1000 IU    [provider]  ciclopirox  (PENLAC ) 8 % solution Apply topically at bedtime. Apply over nail. Apply daily over previous coat. Remove weekly with  polish remover. 10/20/23   McCaughan, Dia D, DPM  DULoxetine  (CYMBALTA ) 20 MG capsule Take 2 capsules (40 mg total) by mouth daily. 06/01/24   Levora Reyes SAUNDERS, MD  fish oil-omega-3 fatty acids 1000 MG capsule Take 1 g by mouth 2 (two) times daily.     [provider]  hydrochlorothiazide  (HYDRODIURIL ) 12.5 MG tablet Take 1 tablet (12.5 mg total) by mouth daily. 06/01/24   Levora Reyes SAUNDERS, MD  ibuprofen (ADVIL) 200 MG tablet Take by mouth.    [provider]  ketoconazole  (NIZORAL ) 2 % shampoo APPLY 1 APPLICATION TOPICALLY ONCE A WEEK. 10/27/22   Levora Reyes SAUNDERS, MD  levothyroxine  (SYNTHROID ) 150 MCG tablet Take 1 tablet (150 mcg total) by mouth daily. 06/01/24   Levora Reyes SAUNDERS, MD  meclizine  (ANTIVERT ) 25 MG tablet Take 1 tablet (25 mg total) by mouth 3 (three) times daily as needed for dizziness. 04/08/24   Levora Reyes SAUNDERS, MD  naproxen  sodium (ALEVE ) 220 MG tablet Take 220-440 mg by mouth 2 (two) times daily as needed (pain/soreness).    [provider]  ofloxacin (OCUFLOX) 0.3 % ophthalmic solution Apply to eye.    [provider]  Polyethyl Glycol-Propyl Glycol (LUBRICANT EYE DROPS) 0.4-0.3 % SOLN Place 1 drop into both eyes 3 (three) times daily as needed (dry/irritated eyes.). Rohto Ice Multi-Symptom Relief    [provider]  Vitamin E (VITAMIN E/D-ALPHA NATURAL) 268 MG (400 UNIT) CAPS Take by mouth.    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  3 children, 3 grandchildren.   Patient's  Education: [employer].  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   Patient is right handed   Caffeine consumption 2-3 daily    Walks daily for roughly 30 minutes at a time.  She has not been doing it in the wintertime due to cold weather.      Social Drivers of Corporate Investment Banker Strain: Low Risk  (03/26/2023)   Overall Financial Resource Strain (CARDIA)    Difficulty of Paying Living Expenses: Not hard at all  Food Insecurity: Low Risk  (06/16/2023)   Received from [facility]   Hunger Vital Sign    Within the past 12 months, you worried that your food would run out before you got money to buy more: Never true    Within the past 12 months, the food you bought just didn't last and you didn't have money to get more. : Never true  Transportation Needs: No Transportation Needs (06/16/2023)   Received  from [facility]    In the past 12 months, has lack of reliable transportation kept you from medical appointments, meetings, work or from getting things needed for daily living? : No  Physical Activity: Insufficiently Active (03/26/2023)   Exercise Vital Sign    Days of Exercise per Week: 2 days    Minutes of Exercise per Session: 30 min  Stress: No  Stress Concern Present (03/26/2023)   [employer] of Occupational Health - Occupational Stress Questionnaire    Feeling of Stress : Not at all  Social Connections: Socially Isolated (03/26/2023)   Social Connection and Isolation Panel    Frequency of Communication with Friends and Family: Three times a week    Frequency of Social Gatherings with Friends and Family: Three times a week    Attends Religious Services: Never    Active Member of Clubs or Organizations: No    Attends Banker Meetings: Never    Marital Status: Divorced  Catering Manager Violence: Not At Risk (03/26/2023)   Humiliation, Afraid, Rape, and Kick questionnaire    Fear of Current or Ex-Partner: No    Emotionally Abused: No    Physically Abused: No    Sexually Abused: No    Review of Systems Per HPI  Objective:   Vitals:   08/22/24 0817  BP: 112/74  Pulse: 61  Temp: (!) 97.4 F (36.3 C)  TempSrc: Temporal  SpO2: 95%  Weight: 201 lb 9.6 oz (91.4 kg)  Height: 5' 5.5 (1.664 m)   Orthostatic VS for the past 24 hrs (Last 3 readings):  BP- Lying BP- Sitting BP- Standing at 0 minutes  08/22/24 0846 132/84 134/82 112/76     Physical Exam Vitals reviewed.  Constitutional:      Appearance: Normal appearance. She is well-developed.  HENT:     Head: Normocephalic and atraumatic.  Eyes:     Conjunctiva/sclera: Conjunctivae normal.     Pupils: Pupils are equal, round, and reactive to light.  Neck:     Vascular: No carotid bruit.  Cardiovascular:     Rate and Rhythm: Normal rate and regular rhythm.     Heart sounds: Normal heart sounds.  Pulmonary:     Effort: Pulmonary effort is normal.     Breath sounds: Normal breath sounds.  Abdominal:     Palpations: Abdomen is soft. There is no pulsatile mass.     Tenderness: There is no abdominal tenderness.  Musculoskeletal:     Right lower leg: No edema.     Left lower leg: No edema.  Skin:    General: Skin is warm and dry.  Neurological:      Mental Status: She is alert and oriented to person, place, and time.  Psychiatric:        Mood and Affect: Mood normal.        Behavior: Behavior normal.        Assessment & Plan:  Elizabeth Lin is a 78 y.o. female . Essential hypertension - Plan: CBC, Basic metabolic panel with GFR, TSH  Fatigue, unspecified type - Plan: CBC, Basic metabolic panel with GFR, TSH, Ambulatory referral to Gastroenterology  Loose stools - Plan: Ambulatory referral to Gastroenterology  Some concerns of orthostasis at home which may be contribute to fatigue.  Did not have significant drop in office but will continue to monitor.  Close follow-up with home readings and if she is having low blood pressure readings especially with standing up or feeling  lightheaded/dizzy, will let me know right away and we can adjust meds.  Recommended to continue to not skip meals, regular meals to lessen contribution to fatigue or lightheadedness.  Also continue to stay hydrated.  Possible contribution of stress with fatigue as well, continue meeting with therapist.  Will check labs as above with diarrhea and fatigue.  Additional referral placed to gastroenterology given her persistent leakage, diarrhea with RTC/ER precautions.  Low-dose fiber supplement may be helpful if some of her leakage is from leak around it and constipation, and fiber may help regulate bowel movements but if that worsens her diarrhea, or loose stools, advised to stop right away.  1 month follow-up with RTC precautions sooner if any new or worsening symptoms.  No orders of the defined types were placed in this encounter.  Patient Instructions  For fatigue I will check some blood work today but I expect that to be stable from your readings earlier in the year.  Blood pressure does not drop as much here in the office, but I think we should continue to keep an eye on that with home readings.  Bring this to your next visit.  If you have any low blood  pressure readings, especially after standing up or feeling lightheaded or dizzy, let me know right away.  Make sure you do not skip meals.  Continue to stay hydrated during the day.  A small dose of a fiber supplement like Citrucel may be helpful but if that worsens diarrhea or leakage, stop it right away.  I did place another referral to gastroenterology.  If you do not hear from them in the next week or 2, let me know.  Continue talking with your therapist as some of the stress can also affect fatigue.  I would like to follow-up with you in 1 month and see how things are going but I am happy to see you sooner if needed.  Hang in there!  Return to the clinic or go to the nearest emergency room if any of your symptoms worsen or new symptoms occur.         Signed,   Reyes Pines, MD [facility], [facility] 08/22/24 8:49 AM

## 2024-08-24 ENCOUNTER — Ambulatory Visit

## 2024-08-28 ENCOUNTER — Ambulatory Visit: Payer: Self-pay | Admitting: Family Medicine

## 2024-08-29 DIAGNOSIS — I4510 Unspecified right bundle-branch block: Secondary | ICD-10-CM | POA: Diagnosis not present

## 2024-08-29 DIAGNOSIS — R001 Bradycardia, unspecified: Secondary | ICD-10-CM

## 2024-09-01 DIAGNOSIS — F331 Major depressive disorder, recurrent, moderate: Secondary | ICD-10-CM | POA: Diagnosis not present

## 2024-09-01 DIAGNOSIS — F411 Generalized anxiety disorder: Secondary | ICD-10-CM | POA: Diagnosis not present

## 2024-09-08 ENCOUNTER — Ambulatory Visit

## 2024-09-08 DIAGNOSIS — F411 Generalized anxiety disorder: Secondary | ICD-10-CM | POA: Diagnosis not present

## 2024-09-08 DIAGNOSIS — F331 Major depressive disorder, recurrent, moderate: Secondary | ICD-10-CM | POA: Diagnosis not present

## 2024-09-09 ENCOUNTER — Ambulatory Visit: Admitting: Gastroenterology

## 2024-09-12 ENCOUNTER — Encounter: Payer: Self-pay | Admitting: Family Medicine

## 2024-09-12 ENCOUNTER — Other Ambulatory Visit: Payer: Self-pay | Admitting: Family Medicine

## 2024-09-12 ENCOUNTER — Ambulatory Visit: Admitting: Nurse Practitioner

## 2024-09-12 VITALS — BP 129/74 | HR 60 | Temp 98.0°F | Ht 65.5 in | Wt 196.0 lb

## 2024-09-12 DIAGNOSIS — E785 Hyperlipidemia, unspecified: Secondary | ICD-10-CM

## 2024-09-12 DIAGNOSIS — F32A Depression, unspecified: Secondary | ICD-10-CM | POA: Diagnosis not present

## 2024-09-12 DIAGNOSIS — E66811 Obesity, class 1: Secondary | ICD-10-CM

## 2024-09-12 DIAGNOSIS — I10 Essential (primary) hypertension: Secondary | ICD-10-CM

## 2024-09-12 DIAGNOSIS — Z6832 Body mass index (BMI) 32.0-32.9, adult: Secondary | ICD-10-CM | POA: Diagnosis not present

## 2024-09-12 NOTE — Progress Notes (Signed)
 Office: [phone]  /  Fax: [phone]   Initial Visit  Elizabeth Lin was seen in clinic today to evaluate for obesity. She is interested in losing weight to improve overall health and reduce the risk of weight related complications. She presents today to review program treatment options, initial physical assessment, and evaluation.     She was referred by: Specialist  When asked what else they would like to accomplish? She states: Adopt a healthier eating pattern and lifestyle, Improve energy levels and physical activity, Improve existing medical conditions, Reduce number of medications, and Improve quality of life  Weight history:  she takes care of her adult daughter who is special needs.  Her weight has slowly increased over time.  She has a psychologist, educational, goes 2 days/ wk and walks dog 1 hr most days of the week.  She would like to get back to 170 lb.  She lives at home with her daughter.  She got divorced and her ex recently passed away.  Her son is a family practice doctor in [facility], and daughter is a IT TRAINER locally.    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  When asked how has your weight affected you? She states: Contributed to orthopedic problems or mobility issues, Having fatigue, and Has affected mood   Some associated conditions: Hypertension, Arthritis:all over, and Hyperlipidemia Depression  Contributing factors: family history of obesity and moderate to high levels of stress  Weight promoting medications identified: None  Current nutrition plan: None  Current level of physical activity: Walking 60 minutes, five a week and Strength training 30 minutes, twice  Current or previous pharmacotherapy: None  Response to medication: Never tried medications   Past medical history includes:   Past Medical History:  Diagnosis Date   Allergy    Anxiety    Arthritis    Carpal tunnel syndrome    right hand, getting injections   Cataract    Depression    Dysrhythmia    BBB   Hammer toe     Hypothyroidism due to Hashimoto's thyroiditis    Meniere disease, right      Objective:   BP 129/74   Pulse 60   Temp 98 F (36.7 C)   Ht 5' 5.5 (1.664 m)   Wt 196 lb (88.9 kg)   SpO2 96%   BMI 32.12 kg/m  She was weighed on the bioimpedance scale: Body mass index is 32.12 kg/m.  Peak Weight:214lb , Body Fat%:46, Visceral Fat Rating:14, Weight trend over the last 12 months: Unchanged  General:  Alert, oriented and cooperative. Patient is in no acute distress.  Respiratory: Normal respiratory effort, no problems with respiration noted   Gait: able to ambulate independently  Mental Status: Normal mood and affect. Normal behavior. Normal judgment and thought content.   DIAGNOSTIC DATA REVIEWED:  BMET    Component Value Date/Time   NA 138 08/22/2024 0857   NA 136 06/08/2020 1129   K 3.9 08/22/2024 0857   CL 98 08/22/2024 0857   CO2 31 08/22/2024 0857   GLUCOSE 102 (H) 08/22/2024 0857   BUN 16 08/22/2024 0857   BUN 19 06/08/2020 1129   CREATININE 0.73 08/22/2024 0857   CREATININE 0.68 01/12/2015 1359   CALCIUM  9.6 08/22/2024 0857   GFRNONAA 73 06/08/2020 1129   GFRAA 84 06/08/2020 1129   Lab Results  Component Value Date   HGBA1C 5.6 10/21/2017   HGBA1C 5.3 09/01/2014   No results found for: INSULIN CBC    Component Value Date/Time  WBC 5.7 08/22/2024 0857   RBC 4.88 08/22/2024 0857   HGB 13.9 08/22/2024 0857   HGB 13.6 06/08/2020 1129   HCT 41.8 08/22/2024 0857   HCT 44.9 06/08/2020 1129   PLT 314.0 08/22/2024 0857   PLT 340 06/08/2020 1129   MCV 85.8 08/22/2024 0857   MCV 91 06/08/2020 1129   MCH 27.4 06/08/2020 1129   MCH 27.9 11/08/2019 1257   MCHC 33.2 08/22/2024 0857   RDW 15.0 08/22/2024 0857   RDW 14.6 06/08/2020 1129   Iron/TIBC/Ferritin/ %Sat No results found for: IRON, TIBC, FERRITIN, IRONPCTSAT Lipid Panel     Component Value Date/Time   CHOL 208 (H) 06/02/2024 1035   CHOL 280 (H) 06/08/2020 1129   TRIG 106.0 06/02/2024  1035   HDL 79.90 06/02/2024 1035   HDL 74 06/08/2020 1129   CHOLHDL 3 06/02/2024 1035   VLDL 21.2 06/02/2024 1035   LDLCALC 106 (H) 06/02/2024 1035   LDLCALC 177 (H) 06/08/2020 1129   Hepatic Function Panel     Component Value Date/Time   PROT 7.3 04/08/2024 0906   PROT 6.9 06/08/2020 1129   ALBUMIN 4.2 04/08/2024 0906   ALBUMIN 4.2 06/08/2020 1129   AST 23 04/08/2024 0906   ALT 19 04/08/2024 0906   ALKPHOS 81 04/08/2024 0906   BILITOT 0.6 04/08/2024 0906   BILITOT 0.5 06/08/2020 1129      Component Value Date/Time   TSH 2.26 08/22/2024 0857     Assessment and Plan:   Essential hypertension BP at goal on hydrochlorothiazide  12.5 mg daily Continue current meds Watch for changes with weight reduction  Class 1 obesity due to excess calories without serious comorbidity with body mass index (BMI) of 32.0 to 32.9 in adult  Hyperlipidemia, unspecified hyperlipidemia type Doing well on Lipitor 10 mg 2 days/ wk Look for improvements with healthy lifestyle changes  Depression, unspecified depression type Stress levels high at home though she has a good support system and is in counseling Continue Cymbalta  40 mg daily She does note a long hx of emotional eating      Obesity Treatment / Action Plan:  Patient will work on garnering support from family and friends to begin weight loss journey. Will work on eliminating or reducing the presence of highly palatable, calorie dense foods in the home. Will complete provided nutritional and psychosocial assessment questionnaire before the next appointment. Will be scheduled for indirect calorimetry to determine resting energy expenditure in a fasting state.  This will allow us  to create a reduced calorie, high-protein meal plan to promote loss of fat mass while preserving muscle mass. Will think about ideas on how to incorporate physical activity into their daily routine. Was counseled on nutritional approaches to weight loss and  benefits of reducing processed foods and consuming plant-based foods and high quality protein as part of nutritional weight management. Was counseled on pharmacotherapy and role as an adjunct in weight management.   Obesity Education Performed Today:  She was weighed on the bioimpedance scale and results were discussed and documented in the synopsis.  We discussed obesity as a disease and the importance of a more detailed evaluation of all the factors contributing to the disease.  We discussed the importance of long term lifestyle changes which include nutrition, exercise and behavioral modifications as well as the importance of customizing this to her specific health and social needs.  We discussed the benefits of reaching a healthier weight to alleviate the symptoms of existing conditions and reduce the  risks of the biomechanical, metabolic and psychological effects of obesity.  Meganne R Westerlund appears to be in the action stage of change and states they are ready to start intensive lifestyle modifications and behavioral modifications.  24 minutes was spent today on this visit including the above counseling, pre-visit chart review, and post-visit documentation.  Reviewed by clinician on day of visit: allergies, medications, problem list, medical history, surgical history, family history, social history, and previous encounter notes pertinent to obesity diagnosis.    Darice Haddock, D.O. DABFM, [facility] [address] [place], [place] [address] [phone]

## 2024-09-28 ENCOUNTER — Encounter: Payer: Self-pay | Admitting: Family Medicine

## 2024-09-28 ENCOUNTER — Ambulatory Visit: Admitting: Family Medicine

## 2024-09-28 VITALS — BP 130/76 | HR 58 | Temp 98.0°F | Resp 18 | Ht 65.5 in | Wt 202.6 lb

## 2024-09-28 DIAGNOSIS — I10 Essential (primary) hypertension: Secondary | ICD-10-CM

## 2024-09-28 DIAGNOSIS — R5383 Other fatigue: Secondary | ICD-10-CM

## 2024-09-28 NOTE — Patient Instructions (Addendum)
 Labs were reassuring, and blood pressure looks okay today.  I am glad to hear that the fatigue has improved, and I do suspect that the stress may have been a contributor.  Keep up the good work with regular meals, staying hydrated, and happy to see you sooner if any return of fatigue or new symptoms.  Otherwise lets follow-up in 6 months for routine meds and labs.  Take care and Happy New Year!

## 2024-09-28 NOTE — Progress Notes (Signed)
 "  Subjective:  Patient ID: Elizabeth Lin, female    DOB: 1946-03-21  Age: 78 y.o. MRN: [id number]  CC:  Chief Complaint  Patient presents with   Follow-up    1 month follow up on fatigue. Has gotten better. Loose bowels are better. See's GI 10/19/23   Hypertension    Has not checked her BP    HPI Elizabeth Lin presents for   Fatigue: Discussed at November 24 visit.  Possibly multifactorial.  Recommended to continue to monitor blood pressure to see if there were any drops in readings at home, possible orthostatic contribution..  Regular meals discussed, hydration discussed.  Fiber supplement to help with hard stools discussed.  Referred to gastroenterology for stool changes.  Also discussed stress and possible impact on her fatigue, recommended further discussion with her therapist. Normal CBC, TSh, BMP except glucose 102.   Doing better since last visit. Less fatigue. Still some stress, but improving - found a 5 day program for her daughter Elizabeth Lin. Seems to be comfortable there.  Encouraged by this change.   Bowels are better. Less watery stools. Has appt with GI in few weeks.  No home BP readings.  Drinking more water during day, regular meals.    BP Readings from Last 3 Encounters:  09/28/24 130/76  09/12/24 129/74  08/22/24 112/74      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   Sensorineural hearing loss (SNHL), bilateral 04/27/2019   Meniere disease, left 12/12/2017   Mild aortic stenosis 11/10/2017   Bilateral edema of lower extremity 11/10/2017   Family history of premature CAD  11/10/2017   Arthralgia of multiple joints 10/23/2017   Class 1 obesity due to excess calories without serious comorbidity with body mass index (BMI) of 32.0 to 32.9 in adult 10/23/2017   Essential hypertension 10/23/2017   Right bundle branch block (RBBB) 10/23/2017   Pure hypercholesterolemia 10/23/2017   Vitamin D  deficiency 09/06/2014   Right knee pain 02/24/2013   Hypothyroid 11/02/2011   Depression 11/02/2011   History of positive PPD 11/02/2011   Past Medical History:  Diagnosis Date   Allergy    Anxiety    Arthritis    Carpal tunnel syndrome    right hand, getting injections   Cataract    Depression    Dysrhythmia    BBB   Hammer toe    Hypothyroidism due to Hashimoto's thyroiditis    Meniere disease, right    Past Surgical History:  Procedure Laterality Date   APPENDECTOMY     COLONOSCOPY     CYSTECTOMY     eye   LAPAROSCOPIC ASSISTED SPIGELIAN HERNIA REPAIR N/A 11/08/2019   Procedure: LAPAROSCOPIC ASSISTED SPIGELIAN HERNIA REPAIR WITH MESH;  Surgeon: Belinda Cough, MD;  Location: [facility];  Service: General;  Laterality: N/A;   OOPHORECTOMY Right    TONSILLECTOMY     TRANSTHORACIC ECHOCARDIOGRAM  12/09/2019   EF 60 to 65%.  No R WMA.  GR 1 DD.  Normal RV function.  Aortic calcification with sclerosis but no suggestion of stenosis.  (Mean gradient 8 mmHg)   Allergies[1] Prior  to Admission medications  Medication Sig Start Date End Date Taking? Authorizing Provider  acetaminophen  (TYLENOL ) 500 MG tablet Take by mouth.   Yes [provider]  ascorbic acid (VITAMIN C) 1000 MG tablet Take by mouth.   Yes [provider]  atorvastatin  (LIPITOR) 10 MG tablet 1 Tablet by mouth FOUR times a week 06/01/24  Yes Levora Reyes SAUNDERS, MD  Calcium -Vitamin D -Vitamin K (CALCIUM  + D + K PO) Take 1 tablet by mouth 2 (two) times daily. 1300 mg+D1000 IU   Yes [provider]  ciclopirox  (PENLAC ) 8 % solution Apply topically at bedtime. Apply over nail. Apply  daily over previous coat. Remove weekly with polish remover. 10/20/23  Yes McCaughan, Dia D, DPM  DULoxetine  (CYMBALTA ) 20 MG capsule Take 2 capsules (40 mg total) by mouth daily. 06/01/24  Yes Levora Reyes SAUNDERS, MD  fish oil-omega-3 fatty acids 1000 MG capsule Take 1 g by mouth 2 (two) times daily.    Yes [provider]  hydrochlorothiazide  (HYDRODIURIL ) 12.5 MG tablet Take 1 tablet (12.5 mg total) by mouth daily. 06/01/24  Yes Levora Reyes SAUNDERS, MD  ibuprofen (ADVIL) 200 MG tablet Take by mouth.   Yes [provider]  ketoconazole  (NIZORAL ) 2 % shampoo APPLY 1 APPLICATION TOPICALLY ONCE A WEEK. 10/27/22  Yes Levora Reyes SAUNDERS, MD  levothyroxine  (SYNTHROID ) 150 MCG tablet Take 1 tablet (150 mcg total) by mouth daily. 06/01/24  Yes Levora Reyes SAUNDERS, MD  meclizine  (ANTIVERT ) 25 MG tablet Take 1 tablet (25 mg total) by mouth 3 (three) times daily as needed for dizziness. 04/08/24  Yes Levora Reyes SAUNDERS, MD  naproxen  sodium (ALEVE ) 220 MG tablet Take 220-440 mg by mouth 2 (two) times daily as needed (pain/soreness).   Yes [provider]  ofloxacin (OCUFLOX) 0.3 % ophthalmic solution Apply to eye.   Yes [provider]  Polyethyl Glycol-Propyl Glycol (LUBRICANT EYE DROPS) 0.4-0.3 % SOLN Place 1 drop into both eyes 3 (three) times daily as needed (dry/irritated eyes.). Rohto Ice Multi-Symptom Relief   Yes [provider]  Vitamin E (VITAMIN E/D-ALPHA NATURAL) 268 MG (400 UNIT) CAPS Take by mouth.   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  3 children, 3 grandchildren.   Patient's  Education: [employer].  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   Patient is right handed   Caffeine consumption 2-3 daily    Walks daily for roughly 30 minutes at a time.  She has not been doing it in the wintertime due to cold weather.  Social Drivers of Health   Tobacco Use: Medium Risk (09/28/2024)   Patient History    Smoking Tobacco Use: Former    Smokeless Tobacco Use: Never    Passive Exposure: Not on file  Financial Resource Strain: Low Risk (03/26/2023)   Overall Financial Resource Strain (CARDIA)    Difficulty of Paying Living Expenses: Not hard at all  Food Insecurity: Low Risk (06/16/2023)   Received from [facility]   Epic    Within the past 12 months, you worried that your food would run out before you got money to buy more: Never true    Within the past 12 months, the food you bought just didn't last and you didn't have money to get more. : Never true  Transportation Needs: No Transportation Needs (06/16/2023)   Received from [facility]    In the past 12 months, has lack of reliable transportation kept you from medical appointments, meetings, work or from getting things needed for daily living? : No  Physical Activity: Insufficiently Active (03/26/2023)   Exercise Vital Sign    Days of Exercise per Week: 2 days    Minutes of Exercise per Session: 30 min  Stress: No Stress Concern Present (03/26/2023)   [employer] of Occupational Health - Occupational Stress Questionnaire    Feeling  of Stress : Not at all  Social Connections: Socially Isolated (03/26/2023)   Social Connection and Isolation Panel    Frequency of Communication with Friends and Family: Three times a week    Frequency of Social Gatherings with Friends and Family: Three times a week    Attends Religious Services: Never    Active Member of Clubs or Organizations: No    Attends Banker Meetings: Never    Marital Status: Divorced  Catering Manager Violence: Not At Risk (03/26/2023)   Humiliation, Afraid, Rape, and Kick questionnaire    Fear of Current or Ex-Partner: No    Emotionally Abused: No    Physically Abused: No    Sexually Abused: No  Depression (PHQ2-9): High Risk (08/22/2024)   Depression (PHQ2-9)    PHQ-2 Score: 14  Alcohol  Screen: Low Risk (03/26/2023)   Alcohol  Screen    Last Alcohol  Screening Score (AUDIT): 2  Housing: Unknown (07/19/2024)   Received from [facility]   Epic    At any time in the past 12 months, were you homeless or living in a shelter (including now)?: No    Number of Times Moved in the Last Year: Not on file    Unable to Pay for Housing in the Last Year: Not on file  Utilities: Low Risk (06/16/2023)   Received from [facility]   Utilities    In the past 12 months has the electric, gas, oil, or water company threatened to shut off services in your home? : No  Health Literacy: Not on file    Review of Systems  Per HPI.  Objective:   Vitals:   09/28/24 1040  BP: 130/76  Pulse: (!) 58  Resp: 18  Temp: 98 F (36.7 C)  TempSrc: Temporal  SpO2: 97%  Weight: 202 lb 9.6 oz (91.9 kg)  Height: 5' 5.5 (1.664 m)     Physical Exam Vitals reviewed.  Constitutional:      Appearance: Normal appearance. She is well-developed.  HENT:     Head: Normocephalic and atraumatic.  Eyes:     Conjunctiva/sclera: Conjunctivae normal.     Pupils: Pupils are  equal, round, and reactive to light.  Neck:     Vascular: No carotid bruit.   Cardiovascular:     Rate and Rhythm: Normal rate and regular rhythm.     Lin sounds: Murmur (2-3/6 SEM) heard.  Pulmonary:     Effort: Pulmonary effort is normal.     Breath sounds: Normal breath sounds.  Abdominal:     Palpations: Abdomen is soft. There is no pulsatile mass.     Tenderness: There is no abdominal tenderness.  Musculoskeletal:     Right lower leg: No edema.     Left lower leg: No edema.  Skin:    General: Skin is warm and dry.  Neurological:     Mental Status: She is alert and oriented to person, place, and time.  Psychiatric:        Mood and Affect: Mood normal.        Behavior: Behavior normal.        Assessment & Plan:  Elizabeth Lin is a 78 y.o. female . Essential hypertension  Fatigue, unspecified type Fatigue has improved.  Blood pressure stable in office.  I do suspect multiple causes of fatigue previously including stress, which has improved with her daughter's new program.  Commended on regular meals, regular fluid intake.  No med changes at this time, 90-month follow-up for routine meds and labs with RTC precautions if any new or worsening symptoms sooner.  All questions answered.  No orders of the defined types were placed in this encounter.  Patient Instructions  Labs were reassuring, and blood pressure looks okay today.  I am glad to hear that the fatigue has improved, and I do suspect that the stress may have been a contributor.  Keep up the good work with regular meals, staying hydrated, and happy to see you sooner if any return of fatigue or new symptoms.  Otherwise lets follow-up in 6 months for routine meds and labs.  Take care and Happy New Year!    Signed,   Reyes Pines, MD [facility], [facility] 09/28/2024 11:44 AM       [1]  Allergies Allergen Reactions   Penicillins Rash    Did it involve swelling of the face/tongue/throat, SOB, or low BP? No Did it involve sudden or  severe rash/hives, skin peeling, or any reaction on the inside of your mouth or nose? Yes Did you need to seek medical attention at a hospital or doctor's office? No When did it last happen?  ~6-10 years ago     If all above answers are NO, may proceed with cephalosporin use.    Amoxicillin-Pot Clavulanate     Red rash Did it involve swelling of the face/tongue/throat, SOB, or low BP? No Did it involve sudden or severe rash/hives, skin peeling, or any reaction on the inside of your mouth or nose? Yes Did you need to seek medical attention at a hospital or doctor's office? No When did it last happen? ~6-10 years ago    If all above answers are NO, may proceed with cephalosporin use.    Sulfa Antibiotics    "

## 2024-10-17 ENCOUNTER — Ambulatory Visit: Admitting: Family Medicine

## 2024-10-17 NOTE — Progress Notes (Unsigned)
 "  Chief Complaint: Primary GI MD: Dr. Leigh  HPI:  *** is a  ***  who was referred to me by Levora Reyes SAUNDERS, MD for a complaint of   Discussed the use of AI scribe software for clinical note transcription with the patient, who gave verbal consent to proceed.  History of Present Illness      PREVIOUS GI WORKUP     Past Medical History:  Diagnosis Date   Allergy    Anxiety    Arthritis    Carpal tunnel syndrome    right hand, getting injections   Cataract    Depression    Dysrhythmia    BBB   Hammer toe    Hypothyroidism due to Hashimoto's thyroiditis    Meniere disease, right     Past Surgical History:  Procedure Laterality Date   APPENDECTOMY     COLONOSCOPY     CYSTECTOMY     eye   LAPAROSCOPIC ASSISTED SPIGELIAN HERNIA REPAIR N/A 11/08/2019   Procedure: LAPAROSCOPIC ASSISTED SPIGELIAN HERNIA REPAIR WITH MESH;  Surgeon: Belinda Cough, MD;  Location: [facility];  Service: General;  Laterality: N/A;   OOPHORECTOMY Right    TONSILLECTOMY     TRANSTHORACIC ECHOCARDIOGRAM  12/09/2019   EF 60 to 65%.  No R WMA.  GR 1 DD.  Normal RV function.  Aortic calcification with sclerosis but no suggestion of stenosis.  (Mean gradient 8 mmHg)    Current Outpatient Medications  Medication Sig Dispense Refill   acetaminophen  (TYLENOL ) 500 MG tablet Take by mouth.     ascorbic acid (VITAMIN C) 1000 MG tablet Take by mouth.     atorvastatin  (LIPITOR) 10 MG tablet 1 Tablet by mouth FOUR times a week 36 tablet 2   Calcium -Vitamin D -Vitamin K (CALCIUM  + D + K PO) Take 1 tablet by mouth 2 (two) times daily. 1300 mg+D1000 IU     ciclopirox  (PENLAC ) 8 % solution Apply topically at bedtime. Apply over nail. Apply daily over previous coat. Remove weekly with polish remover. 6.6 mL 11   DULoxetine  (CYMBALTA ) 20 MG capsule Take 2 capsules (40 mg total) by mouth daily. 180 capsule 1   fish oil-omega-3 fatty acids 1000 MG capsule Take 1 g by mouth 2 (two) times daily.       hydrochlorothiazide  (HYDRODIURIL ) 12.5 MG tablet Take 1 tablet (12.5 mg total) by mouth daily. 90 tablet 1   ibuprofen (ADVIL) 200 MG tablet Take by mouth.     ketoconazole  (NIZORAL ) 2 % shampoo APPLY 1 APPLICATION TOPICALLY ONCE A WEEK. 120 mL 0   levothyroxine  (SYNTHROID ) 150 MCG tablet Take 1 tablet (150 mcg total) by mouth daily. 90 tablet 3   meclizine  (ANTIVERT ) 25 MG tablet Take 1 tablet (25 mg total) by mouth 3 (three) times daily as needed for dizziness. 30 tablet 0   naproxen  sodium (ALEVE ) 220 MG tablet Take 220-440 mg by mouth 2 (two) times daily as needed (pain/soreness).     ofloxacin (OCUFLOX) 0.3 % ophthalmic solution Apply to eye.     Polyethyl Glycol-Propyl Glycol (LUBRICANT EYE DROPS) 0.4-0.3 % SOLN Place 1 drop into both eyes 3 (three) times daily as needed (dry/irritated eyes.). Rohto Ice Multi-Symptom Relief     Vitamin E (VITAMIN E/D-ALPHA NATURAL) 268 MG (400 UNIT) CAPS Take by mouth.     No current facility-administered medications for this visit.    Allergies as of 10/18/2024 - Review Complete 09/28/2024  Allergen Reaction Noted   Penicillins Rash 04/28/2018  Amoxicillin-pot clavulanate  11/02/2011   Sulfa antibiotics  11/08/2019    Family History  Problem Relation Age of Onset   Diabetes Father    Myasthenia gravis Father        Died from complications in his 72s   CAD Father        Was too sick with myasthenia gravis to have CABG   Hypertension Father    Thyroid  disease Mother    Cancer Mother        Lung -died at age 68-1/2.   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  3 children, 3 grandchildren.   Patient's  Education: [employer].  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   Patient is right handed   Caffeine consumption 2-3 daily    Walks daily for roughly 30 minutes at a time.  She has not been doing it in the wintertime due to cold weather.      Social Drivers of Health   Tobacco Use: Medium Risk (09/28/2024)   Patient History    Smoking Tobacco Use: Former    Smokeless Tobacco Use: Never    Passive Exposure: Not on file  Financial Resource Strain: Low Risk (03/26/2023)   Overall Financial Resource Strain (CARDIA)    Difficulty of Paying Living Expenses: Not hard at all  Food Insecurity: Low Risk (06/16/2023)   Received from [facility]   Epic    Within the past 12 months, you worried that your food would run out before you got money to buy more: Never true    Within the past 12 months, the food you bought just didn't last and you didn't have money to get more. : Never true  Transportation Needs: No Transportation Needs (06/16/2023)   Received from [facility]    In the past 12 months, has lack of reliable transportation kept you from medical appointments, meetings, work or from getting things needed  for daily living? : No  Physical Activity: Insufficiently Active (03/26/2023)   Exercise Vital Sign    Days of Exercise per Week: 2 days    Minutes of Exercise per Session: 30 min  Stress: No Stress Concern Present (03/26/2023)   [employer] of Occupational Health - Occupational Stress Questionnaire    Feeling of Stress : Not at all  Social Connections: Socially Isolated (03/26/2023)   Social Connection and Isolation Panel    Frequency of Communication with Friends and Family: Three times a week    Frequency of Social Gatherings with Friends and Family: Three times a week    Attends Religious Services: Never    Active Member of Clubs or Organizations: No    Attends Banker Meetings: Never    Marital Status: Divorced  Catering Manager Violence: Not At Risk (03/26/2023)   Humiliation, Afraid, Rape, and Kick questionnaire    Fear of Current or Ex-Partner: No    Emotionally Abused: No    Physically Abused: No    Sexually Abused: No  Depression (PHQ2-9): High Risk (08/22/2024)   Depression (PHQ2-9)    PHQ-2 Score: 14  Alcohol  Screen: Low Risk (03/26/2023)   Alcohol  Screen    Last Alcohol  Screening Score (AUDIT): 2  Housing: Unknown (07/19/2024)   Received from [facility]   Epic    At any time in the past 12 months, were you homeless or living in a shelter (including now)?: No    Number of Times Moved in the Last Year: Not on file    Unable to Pay for Housing in the Last Year: Not on file  Utilities: Low Risk (06/16/2023)   Received from [facility]   Utilities    In the past 12 months has the electric, gas, oil, or water company threatened to shut off services in your home? : No  Health Literacy: Not on file    Review of Systems:    Constitutional: No weight loss, fever, chills, weakness or fatigue HEENT: Eyes: No change in vision               Ears, Nose, Throat:  No change in hearing or congestion Skin: No rash or itching Cardiovascular: No  chest pain, chest pressure or palpitations   Respiratory: No SOB or cough Gastrointestinal: See HPI and otherwise negative Genitourinary: No dysuria or change in urinary frequency Neurological: No headache, dizziness or syncope Musculoskeletal: No new muscle or joint pain Hematologic: No bleeding or bruising Psychiatric: No history of depression or anxiety    Physical Exam:  Vital signs: There were no vitals taken for this visit.  Constitutional: NAD, alert and cooperative Head:  Normocephalic and atraumatic. Eyes:   PEERL, EOMI. No icterus. Conjunctiva pink. Respiratory: Respirations even and unlabored. Lungs clear to auscultation bilaterally.   No wheezes, crackles, or rhonchi.  Cardiovascular:  Regular rate and rhythm. No peripheral edema, cyanosis or pallor.  Gastrointestinal:  Soft, nondistended, nontender. No rebound or guarding. Normal bowel sounds. No appreciable masses or hepatomegaly. Rectal:  Declines Msk:  Symmetrical without gross deformities. Without edema, no deformity or joint abnormality.  Neurologic:  Alert and  oriented x4;  grossly normal neurologically.  Skin:   Dry and intact without significant lesions or rashes. Psychiatric: Oriented to person, place and time. Demonstrates good judgement and reason without abnormal affect or behaviors.  Physical Exam    RELEVANT LABS AND IMAGING: CBC    Component Value Date/Time   WBC 5.7 08/22/2024 0857   RBC 4.88 08/22/2024 0857   HGB 13.9 08/22/2024 0857   HGB 13.6 06/08/2020 1129   HCT 41.8 08/22/2024 0857   HCT 44.9 06/08/2020 1129   PLT 314.0 08/22/2024 0857  PLT 340 06/08/2020 1129   MCV 85.8 08/22/2024 0857   MCV 91 06/08/2020 1129   MCH 27.4 06/08/2020 1129   MCH 27.9 11/08/2019 1257   MCHC 33.2 08/22/2024 0857   RDW 15.0 08/22/2024 0857   RDW 14.6 06/08/2020 1129   LYMPHSABS 2.1 06/03/2013 1151   MONOABS 0.3 06/03/2013 1151   EOSABS 0.2 06/03/2013 1151   BASOSABS 0.1 06/03/2013 1151    CMP      Component Value Date/Time   NA 138 08/22/2024 0857   NA 136 06/08/2020 1129   K 3.9 08/22/2024 0857   CL 98 08/22/2024 0857   CO2 31 08/22/2024 0857   GLUCOSE 102 (H) 08/22/2024 0857   BUN 16 08/22/2024 0857   BUN 19 06/08/2020 1129   CREATININE 0.73 08/22/2024 0857   CREATININE 0.68 01/12/2015 1359   CALCIUM  9.6 08/22/2024 0857   PROT 7.3 04/08/2024 0906   PROT 6.9 06/08/2020 1129   ALBUMIN 4.2 04/08/2024 0906   ALBUMIN 4.2 06/08/2020 1129   AST 23 04/08/2024 0906   ALT 19 04/08/2024 0906   ALKPHOS 81 04/08/2024 0906   BILITOT 0.6 04/08/2024 0906   BILITOT 0.5 06/08/2020 1129   GFRNONAA 73 06/08/2020 1129   GFRAA 84 06/08/2020 1129     Assessment/Plan:    Colon cancer screening Negative colonoscopy 2015 per Dr. Emilia  Status post remote appendectomy/oophorectomy and probable bowel resection 2008 [place]. 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 No significant change since prior study.    Nestor Mollie RIGGERS [facility] 10/17/2024, 9:44 PM  Cc: Levora Reyes SAUNDERS, MD "

## 2024-10-18 ENCOUNTER — Ambulatory Visit: Admitting: Gastroenterology

## 2024-10-18 ENCOUNTER — Encounter: Payer: Self-pay | Admitting: Gastroenterology

## 2024-10-18 VITALS — BP 132/78 | HR 51 | Ht 65.5 in | Wt 204.4 lb

## 2024-10-18 DIAGNOSIS — R159 Full incontinence of feces: Secondary | ICD-10-CM | POA: Diagnosis not present

## 2024-10-18 DIAGNOSIS — R32 Unspecified urinary incontinence: Secondary | ICD-10-CM | POA: Diagnosis not present

## 2024-10-18 NOTE — Progress Notes (Signed)
 Agree with assessment and plan as outlined.

## 2024-10-18 NOTE — Patient Instructions (Signed)
 _______________________________________________________  If your blood pressure at your visit was 140/90 or greater, please contact your primary care physician to follow up on this.  _______________________________________________________  If you are age 80 or older, your body mass index should be between 23-30. Your Body mass index is 33.49 kg/m. If this is out of the aforementioned range listed, please consider follow up with your Primary Care Provider.  If you are age 87 or younger, your body mass index should be between 19-25. Your Body mass index is 33.49 kg/m. If this is out of the aformentioned range listed, please consider follow up with your Primary Care Provider.   ________________________________________________________  The [facility] providers would like to encourage you to use MYCHART to communicate with providers for non-urgent requests or questions.  Due to long hold times on the telephone, sending your provider a message by [facility] may be a faster and more efficient way to get a response.  Please allow 48 business hours for a response.  Please remember that this is for non-urgent requests.  _______________________________________________________  [facility] is using a team-based approach to care.  Your team is made up of your doctor and two to three APPS. Our APPS (Nurse Practitioners and Physician Assistants) work with your physician to ensure care continuity for you. They are fully qualified to address your health concerns and develop a treatment plan. They communicate directly with your gastroenterologist to care for you. Seeing the Advanced Practice Practitioners on your physician's team can help you by facilitating care more promptly, often allowing for earlier appointments, access to diagnostic testing, procedures, and other specialty referrals.   A referral has been sent to pelvic floor therapy. Call them in 2 weeks if you haven't heard from them.  It was a  pleasure to see you today!  Thank you for trusting me with your gastrointestinal care!

## 2024-10-26 ENCOUNTER — Ambulatory Visit: Admitting: *Deleted

## 2024-10-26 VITALS — Ht 65.0 in | Wt 204.0 lb

## 2024-10-26 DIAGNOSIS — Z0000 Encounter for general adult medical examination without abnormal findings: Secondary | ICD-10-CM | POA: Diagnosis not present

## 2024-10-26 NOTE — Progress Notes (Signed)
 "  Chief Complaint  Patient presents with   Medicare Wellness     Subjective:   Elizabeth Lin is a 79 y.o. female who presents for a Medicare Annual Wellness Visit.  No voiced or noted concerns at this time Patient advised to keep follow-up appointment with PCP (11-30-2024)   Visit info / Clinical Intake: Medicare Wellness Visit Type:: Subsequent Annual Wellness Visit Persons participating in visit and providing information:: patient Medicare Wellness Visit Mode:: Telephone If telephone:: video declined Since this visit was completed virtually, some vitals may be partially provided or unavailable. Missing vitals are due to the limitations of the virtual format.: Unable to obtain vitals - no equipment If Telephone or Video please confirm:: I connected with patient using audio/video enable telemedicine. I verified patient identity with two identifiers, discussed telehealth limitations, and patient agreed to proceed. Patient Location:: home Provider Location:: home Interpreter Needed?: No Pre-visit prep was completed: no AWV questionnaire completed by patient prior to visit?: no Living arrangements:: with family/others Patient's Overall Health Status Rating: good Typical amount of pain: none Does pain affect daily life?: no Are you currently prescribed opioids?: no  Dietary Habits and Nutritional Risks How many meals a day?: 2 Eats fruit and vegetables daily?: yes In the last 2 weeks, have you had any of the following?: none Diabetic:: no  Functional Status Activities of Daily Living (to include ambulation/medication): Independent Ambulation: Independent Medication Administration: Independent Home Management (perform basic housework or laundry): Independent Manage your own finances?: yes Primary transportation is: driving Concerns about vision?: no *vision screening is required for WTM* Concerns about hearing?: no  Fall Screening Falls in the past year?: 0 Number of  falls in past year: 0 Was there an injury with Fall?: 0 Fall Risk Category Calculator: 0 Patient Fall Risk Level: Low Fall Risk  Fall Risk Patient at Risk for Falls Due to: No Fall Risks Fall risk Follow up: Falls evaluation completed; Education provided; Falls prevention discussed  Home and Transportation Safety: All rugs have non-skid backing?: N/A, no rugs All stairs or steps have railings?: yes Grab bars in the bathtub or shower?: (!) no Have non-skid surface in bathtub or shower?: yes Good home lighting?: yes Regular seat belt use?: yes Hospital stays in the last year:: no  Cognitive Assessment Difficulty concentrating, remembering, or making decisions? : no Will 6CIT or Mini Cog be Completed: yes What year is it?: 0 points What month is it?: 0 points Give patient an address phrase to remember (5 components): Its very sunny outside today in January About what time is it?: 0 points Count backwards from 20 to 1: 0 points Say the months of the year in reverse: 0 points Repeat the address phrase from earlier: 0 points 6 CIT Score: 0 points  Advance Directives (For Healthcare) Does Patient Have a Medical Advance Directive?: No Would patient like information on creating a medical advance directive?: No - Patient declined  Reviewed/Updated  Reviewed/Updated: Reviewed All (Medical, Surgical, Family, Medications, Allergies, Care Teams, Patient Goals); Surgical History; Family History; Medications; Allergies; Care Teams; Patient Goals; Medical History    Allergies (verified) Penicillins, Amoxicillin-pot clavulanate, and Sulfa antibiotics   Current Medications (verified) Outpatient Encounter Medications as of 10/26/2024  Medication Sig   acetaminophen  (TYLENOL ) 500 MG tablet Take by mouth.   ascorbic acid (VITAMIN C) 1000 MG tablet Take by mouth.   atorvastatin  (LIPITOR) 10 MG tablet 1 Tablet by mouth FOUR times a week   Calcium -Vitamin D -Vitamin K (CALCIUM  + D +  K PO) Take 1  tablet by mouth 2 (two) times daily. 1300 mg+D1000 IU   ciclopirox  (PENLAC ) 8 % solution Apply topically at bedtime. Apply over nail. Apply daily over previous coat. Remove weekly with polish remover.   DULoxetine  (CYMBALTA ) 20 MG capsule Take 2 capsules (40 mg total) by mouth daily.   fish oil-omega-3 fatty acids 1000 MG capsule Take 1 g by mouth 2 (two) times daily.    hydrochlorothiazide  (HYDRODIURIL ) 12.5 MG tablet Take 1 tablet (12.5 mg total) by mouth daily.   ibuprofen (ADVIL) 200 MG tablet Take by mouth.   ketoconazole  (NIZORAL ) 2 % shampoo APPLY 1 APPLICATION TOPICALLY ONCE A WEEK.   levothyroxine  (SYNTHROID ) 150 MCG tablet Take 1 tablet (150 mcg total) by mouth daily.   meclizine  (ANTIVERT ) 25 MG tablet Take 1 tablet (25 mg total) by mouth 3 (three) times daily as needed for dizziness.   naproxen  sodium (ALEVE ) 220 MG tablet Take 220-440 mg by mouth 2 (two) times daily as needed (pain/soreness).   ofloxacin (OCUFLOX) 0.3 % ophthalmic solution Apply to eye.   Polyethyl Glycol-Propyl Glycol (LUBRICANT EYE DROPS) 0.4-0.3 % SOLN Place 1 drop into both eyes 3 (three) times daily as needed (dry/irritated eyes.). Rohto Ice Multi-Symptom Relief   Vitamin E (VITAMIN E/D-ALPHA NATURAL) 268 MG (400 UNIT) CAPS Take by mouth.   No facility-administered encounter medications on file as of 10/26/2024.    History: Past Medical History:  Diagnosis Date   Allergy    Anxiety    Arthritis    Carpal tunnel syndrome    right hand, getting injections   Cataract    Depression    Dysrhythmia    BBB   Hammer toe    Hypothyroidism due to Hashimoto's thyroiditis    Meniere disease, right    Past Surgical History:  Procedure Laterality Date   APPENDECTOMY     CATARACT EXTRACTION     COLONOSCOPY     CYSTECTOMY     eye   LAPAROSCOPIC ASSISTED SPIGELIAN HERNIA REPAIR N/A 11/08/2019   Procedure: LAPAROSCOPIC ASSISTED SPIGELIAN HERNIA REPAIR WITH MESH;  Surgeon: Belinda Cough, MD;  Location: [facility];   Service: General;  Laterality: N/A;   OOPHORECTOMY Right    TONSILLECTOMY     TRANSTHORACIC ECHOCARDIOGRAM  12/09/2019   EF 60 to 65%.  No R WMA.  GR 1 DD.  Normal RV function.  Aortic calcification with sclerosis but no suggestion of stenosis.  (Mean gradient 8 mmHg)   Family History  Problem Relation Age of Onset   Diabetes Father    Myasthenia gravis Father        Died from complications in his 91s   CAD Father        Was too sick with myasthenia gravis to have CABG   Hypertension Father    Thyroid  disease Mother    Cancer Mother        Lung -died at age 45-1/2.   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  Tobacco Use  Smoking status: Former    Current packs/day: 0.00    Types: Cigarettes    Quit date: 11/01/1974    Years since quitting: 50.0   Smokeless tobacco: Never  Vaping Use   Vaping status: Never Used  Substance and Sexual Activity   Alcohol  use: No    Comment: occasional wine   Drug use: No   Sexual activity: Never   Tobacco Counseling Counseling given: Not Answered  SDOH Screenings   Food Insecurity: No Food Insecurity (10/26/2024)  Housing: Unknown (10/26/2024)  Transportation Needs: No Transportation Needs (10/26/2024)  Utilities: Not At Risk (10/26/2024)  Alcohol  Screen: Low Risk (03/26/2023)  Depression (PHQ2-9): Medium Risk (10/26/2024)  Financial Resource Strain: Low Risk (03/26/2023)  Physical Activity: Insufficiently Active (10/26/2024)  Social Connections: Socially Isolated (10/26/2024)  Stress: No Stress Concern Present (10/26/2024)  Tobacco Use: Medium Risk (10/26/2024)   Health Literacy: Adequate Health Literacy (10/26/2024)   See flowsheets for full screening details  Depression Screen PHQ 2 & 9 Depression Scale- Over the past 2 weeks, how often have you been bothered by any of the following problems? Little interest or pleasure in doing things: 2 Feeling down, depressed, or hopeless (PHQ Adolescent also includes...irritable): 0 PHQ-2 Total Score: 2 Trouble falling or staying asleep, or sleeping too much: 2 Feeling tired or having little energy: 2 Poor appetite or overeating (PHQ Adolescent also includes...weight loss): 0 Feeling bad about yourself - or that you are a failure or have let yourself or your family down: 0 Trouble concentrating on things, such as reading the newspaper or watching television (PHQ Adolescent also includes...like school work): 1 Moving or speaking so slowly that other people could have noticed. Or the opposite - being so fidgety or restless that you have been moving around a lot more than usual: 0 Thoughts that you would be better off dead, or of hurting yourself in some way: 0 PHQ-9 Total Score: 7 If you checked off any problems, how difficult have these problems made it for you to do your work, take care of things at home, or get along with other people?: Not difficult at all  Depression Treatment Depression Interventions/Treatment : Currently on Treatment; Counseling     Goals Addressed             This Visit's Progress    Patient Stated   On track    Try to loose some weight. Exercise more      Patient Stated       Would like to move more             Objective:    Today's Vitals   10/26/24 1131  Weight: 204 lb (92.5 kg)  Height: 5' 5 (1.651 m)   Body mass index is 33.95 kg/m.  Hearing/Vision screen Hearing Screening - Comments:: Bilateral hearing aids Vision Screening - Comments:: Up to date [facility] Immunizations and Health Maintenance Health Maintenance  Topic Date Due   COVID-19 Vaccine (5 -  2025-26 season) 05/30/2024   Zoster Vaccines- Shingrix (1 of 2) 12/27/2024 (Originally 09/20/1965)   Medicare Annual Wellness (AWV)  10/26/2025   Colonoscopy  02/10/2029   DTaP/Tdap/Td (3 - Td or Tdap) 08/16/2033   Pneumococcal Vaccine: 50+ Years  Completed   Influenza Vaccine  Completed   Bone Density Scan  Completed   Hepatitis C Screening  Completed   Meningococcal B Vaccine  Aged Out   Mammogram  Discontinued        Assessment/Plan:  This is a routine wellness  examination for [facility].  Patient Care Team: Levora Reyes SAUNDERS, MD as PCP - General (Family Medicine) Anner Alm ORN, MD as PCP - Cardiology (Cardiology) Marget Alm, MD as Consulting Physician (Obstetrics and Gynecology)  I have personally reviewed and noted the following in the patients chart:   Medical and social history Use of alcohol , tobacco or illicit drugs  Current medications and supplements including opioid prescriptions. Functional ability and status Nutritional status Physical activity Advanced directives List of other physicians Hospitalizations, surgeries, and ER visits in previous 12 months Vitals Screenings to include cognitive, depression, and falls Referrals and appointments  No orders of the defined types were placed in this encounter.  In addition, I have reviewed and discussed with patient certain preventive protocols, quality metrics, and best practice recommendations. A written personalized care plan for preventive services as well as general preventive health recommendations were provided to patient.   Terika Pillard, LPN   8/71/7973   Return in 1 year (on 10/26/2025).  After Visit Summary: (MyChart) Due to this being a telephonic visit, the after visit summary with patients personalized plan was offered to patient via MyChart   Nurse Notes:  "

## 2024-10-26 NOTE — Patient Instructions (Signed)
 Elizabeth Lin , Thank you for taking time to come for your Medicare Wellness Visit. I appreciate your ongoing commitment to your health goals. Please review the following plan we discussed and let me know if I can assist you in the future.   Screening recommendations/referrals: Colonoscopy:  Mammogram:  Bone Density:  Recommended yearly ophthalmology/optometry visit for glaucoma screening and checkup Recommended yearly dental visit for hygiene and checkup  Vaccinations: Influenza vaccine:  Pneumococcal vaccine:  Tdap vaccine:  Shingles vaccine:      Preventive Care 65 Years and Older, Female Preventive care refers to lifestyle choices and visits with your health care provider that can promote health and wellness. What does preventive care include? A yearly physical exam. This is also called an annual well check. Dental exams once or twice a year. Routine eye exams. Ask your health care provider how often you should have your eyes checked. Personal lifestyle choices, including: Daily care of your teeth and gums. Regular physical activity. Eating a healthy diet. Avoiding tobacco and drug use. Limiting alcohol  use. Practicing safe sex. Taking low-dose aspirin every day. Taking vitamin and mineral supplements as recommended by your health care provider. What happens during an annual well check? The services and screenings done by your health care provider during your annual well check will depend on your age, overall health, lifestyle risk factors, and family history of disease. Counseling  Your health care provider may ask you questions about your: Alcohol  use. Tobacco use. Drug use. Emotional well-being. Home and relationship well-being. Sexual activity. Eating habits. History of falls. Memory and ability to understand (cognition). Work and work astronomer. Reproductive health. Screening  You may have the following tests or measurements: Height, weight, and BMI. Blood  pressure. Lipid and cholesterol levels. These may be checked every 5 years, or more frequently if you are over 63 years old. Skin check. Lung cancer screening. You may have this screening every year starting at age 27 if you have a 30-pack-year history of smoking and currently smoke or have quit within the past 15 years. Fecal occult blood test (FOBT) of the stool. You may have this test every year starting at age 15. Flexible sigmoidoscopy or colonoscopy. You may have a sigmoidoscopy every 5 years or a colonoscopy every 10 years starting at age 46. Hepatitis C blood test. Hepatitis B blood test. Sexually transmitted disease (STD) testing. Diabetes screening. This is done by checking your blood sugar (glucose) after you have not eaten for a while (fasting). You may have this done every 1-3 years. Bone density scan. This is done to screen for osteoporosis. You may have this done starting at age 40. Mammogram. This may be done every 1-2 years. Talk to your health care provider about how often you should have regular mammograms. Talk with your health care provider about your test results, treatment options, and if necessary, the need for more tests. Vaccines  Your health care provider may recommend certain vaccines, such as: Influenza vaccine. This is recommended every year. Tetanus, diphtheria, and acellular pertussis (Tdap, Td) vaccine. You may need a Td booster every 10 years. Zoster vaccine. You may need this after age 93. Pneumococcal 13-valent conjugate (PCV13) vaccine. One dose is recommended after age 45. Pneumococcal polysaccharide (PPSV23) vaccine. One dose is recommended after age 30. Talk to your health care provider about which screenings and vaccines you need and how often you need them. This information is not intended to replace advice given to you by your health care provider.  Make sure you discuss any questions you have with your health care provider. Document Released:  10/12/2015 Document Revised: 06/04/2016 Document Reviewed: 07/17/2015 Elsevier Interactive Patient Education  2017 [employer].  Fall Prevention in the Home Falls can cause injuries. They can happen to people of all ages. There are many things you can do to make your home safe and to help prevent falls. What can I do on the outside of my home? Regularly fix the edges of walkways and driveways and fix any cracks. Remove anything that might make you trip as you walk through a door, such as a raised step or threshold. Trim any bushes or trees on the path to your home. Use bright outdoor lighting. Clear any walking paths of anything that might make someone trip, such as rocks or tools. Regularly check to see if handrails are loose or broken. Make sure that both sides of any steps have handrails. Any raised decks and porches should have guardrails on the edges. Have any leaves, snow, or ice cleared regularly. Use sand or salt on walking paths during winter. Clean up any spills in your garage right away. This includes oil or grease spills. What can I do in the bathroom? Use night lights. Install grab bars by the toilet and in the tub and shower. Do not use towel bars as grab bars. Use non-skid mats or decals in the tub or shower. If you need to sit down in the shower, use a plastic, non-slip stool. Keep the floor dry. Clean up any water that spills on the floor as soon as it happens. Remove soap buildup in the tub or shower regularly. Attach bath mats securely with double-sided non-slip rug tape. Do not have throw rugs and other things on the floor that can make you trip. What can I do in the bedroom? Use night lights. Make sure that you have a light by your bed that is easy to reach. Do not use any sheets or blankets that are too big for your bed. They should not hang down onto the floor. Have a firm chair that has side arms. You can use this for support while you get dressed. Do not have  throw rugs and other things on the floor that can make you trip. What can I do in the kitchen? Clean up any spills right away. Avoid walking on wet floors. Keep items that you use a lot in easy-to-reach places. If you need to reach something above you, use a strong step stool that has a grab bar. Keep electrical cords out of the way. Do not use floor polish or wax that makes floors slippery. If you must use wax, use non-skid floor wax. Do not have throw rugs and other things on the floor that can make you trip. What can I do with my stairs? Do not leave any items on the stairs. Make sure that there are handrails on both sides of the stairs and use them. Fix handrails that are broken or loose. Make sure that handrails are as long as the stairways. Check any carpeting to make sure that it is firmly attached to the stairs. Fix any carpet that is loose or worn. Avoid having throw rugs at the top or bottom of the stairs. If you do have throw rugs, attach them to the floor with carpet tape. Make sure that you have a light switch at the top of the stairs and the bottom of the stairs. If you do not have them,  ask someone to add them for you. What else can I do to help prevent falls? Wear shoes that: Do not have high heels. Have rubber bottoms. Are comfortable and fit you well. Are closed at the toe. Do not wear sandals. If you use a stepladder: Make sure that it is fully opened. Do not climb a closed stepladder. Make sure that both sides of the stepladder are locked into place. Ask someone to hold it for you, if possible. Clearly mark and make sure that you can see: Any grab bars or handrails. First and last steps. Where the edge of each step is. Use tools that help you move around (mobility aids) if they are needed. These include: Canes. Walkers. Scooters. Crutches. Turn on the lights when you go into a dark area. Replace any light bulbs as soon as they burn out. Set up your furniture so  you have a clear path. Avoid moving your furniture around. If any of your floors are uneven, fix them. If there are any pets around you, be aware of where they are. Review your medicines with your doctor. Some medicines can make you feel dizzy. This can increase your chance of falling. Ask your doctor what other things that you can do to help prevent falls. This information is not intended to replace advice given to you by your health care provider. Make sure you discuss any questions you have with your health care provider. Document Released: 07/12/2009 Document Revised: 02/21/2016 Document Reviewed: 10/20/2014 Elsevier Interactive Patient Education  2017 [employer].

## 2024-10-31 ENCOUNTER — Ambulatory Visit: Admitting: Family Medicine

## 2024-11-02 ENCOUNTER — Other Ambulatory Visit: Payer: Self-pay | Admitting: Family Medicine

## 2024-11-02 DIAGNOSIS — E785 Hyperlipidemia, unspecified: Secondary | ICD-10-CM

## 2024-11-08 ENCOUNTER — Ambulatory Visit: Admitting: Family Medicine

## 2024-11-22 ENCOUNTER — Ambulatory Visit: Admitting: Family Medicine

## 2024-11-30 ENCOUNTER — Ambulatory Visit: Admitting: Family Medicine
